# Patient Record
Sex: Female | Born: 1965 | Race: Black or African American | Hispanic: No | Marital: Single | Smoking: Never smoker
Health system: Southern US, Community
[De-identification: ages and names within clinical notes are randomized; demographics above are authoritative.]

## PROBLEM LIST (undated history)

## (undated) DIAGNOSIS — Z923 Personal history of irradiation: Secondary | ICD-10-CM

## (undated) DIAGNOSIS — Z9221 Personal history of antineoplastic chemotherapy: Secondary | ICD-10-CM

## (undated) DIAGNOSIS — C7951 Secondary malignant neoplasm of bone: Secondary | ICD-10-CM

## (undated) DIAGNOSIS — F32A Depression, unspecified: Secondary | ICD-10-CM

## (undated) DIAGNOSIS — F329 Major depressive disorder, single episode, unspecified: Secondary | ICD-10-CM

## (undated) DIAGNOSIS — C801 Malignant (primary) neoplasm, unspecified: Secondary | ICD-10-CM

## (undated) DIAGNOSIS — K219 Gastro-esophageal reflux disease without esophagitis: Secondary | ICD-10-CM

## (undated) DIAGNOSIS — F419 Anxiety disorder, unspecified: Secondary | ICD-10-CM

## (undated) DIAGNOSIS — D649 Anemia, unspecified: Secondary | ICD-10-CM

## (undated) DIAGNOSIS — R51 Headache: Secondary | ICD-10-CM

## (undated) DIAGNOSIS — R519 Headache, unspecified: Secondary | ICD-10-CM

## (undated) DIAGNOSIS — E119 Type 2 diabetes mellitus without complications: Secondary | ICD-10-CM

## (undated) HISTORY — PX: TUBAL LIGATION: SHX77

## (undated) HISTORY — PX: CHOLECYSTECTOMY: SHX55

## (undated) HISTORY — PX: CERVICAL CONE BIOPSY: SUR198

---

## 1998-08-15 ENCOUNTER — Inpatient Hospital Stay (HOSPITAL_COMMUNITY): Admission: AD | Admit: 1998-08-15 | Discharge: 1998-08-15 | Payer: Self-pay | Admitting: *Deleted

## 1998-11-14 ENCOUNTER — Emergency Department (HOSPITAL_COMMUNITY): Admission: EM | Admit: 1998-11-14 | Discharge: 1998-11-14 | Payer: Self-pay | Admitting: *Deleted

## 1999-01-15 ENCOUNTER — Ambulatory Visit (HOSPITAL_COMMUNITY): Admission: RE | Admit: 1999-01-15 | Discharge: 1999-01-15 | Payer: Self-pay | Admitting: Family Medicine

## 1999-01-15 ENCOUNTER — Encounter: Payer: Self-pay | Admitting: Family Medicine

## 1999-02-06 ENCOUNTER — Encounter: Payer: Self-pay | Admitting: Orthopedic Surgery

## 1999-02-06 ENCOUNTER — Encounter: Admission: RE | Admit: 1999-02-06 | Discharge: 1999-02-06 | Payer: Self-pay | Admitting: Physical Therapy

## 1999-06-06 ENCOUNTER — Emergency Department (HOSPITAL_COMMUNITY): Admission: EM | Admit: 1999-06-06 | Discharge: 1999-06-06 | Payer: Self-pay | Admitting: Emergency Medicine

## 1999-08-09 ENCOUNTER — Other Ambulatory Visit: Admission: RE | Admit: 1999-08-09 | Discharge: 1999-08-09 | Payer: Self-pay | Admitting: Family Medicine

## 2000-11-06 ENCOUNTER — Inpatient Hospital Stay (HOSPITAL_COMMUNITY): Admission: AD | Admit: 2000-11-06 | Discharge: 2000-11-06 | Payer: Self-pay | Admitting: Obstetrics & Gynecology

## 2002-05-09 ENCOUNTER — Emergency Department (HOSPITAL_COMMUNITY): Admission: EM | Admit: 2002-05-09 | Discharge: 2002-05-09 | Payer: Self-pay | Admitting: *Deleted

## 2002-05-09 ENCOUNTER — Encounter: Payer: Self-pay | Admitting: Emergency Medicine

## 2003-11-11 ENCOUNTER — Inpatient Hospital Stay (HOSPITAL_COMMUNITY): Admission: AD | Admit: 2003-11-11 | Discharge: 2003-11-11 | Payer: Self-pay | Admitting: Obstetrics

## 2005-06-11 ENCOUNTER — Emergency Department (HOSPITAL_COMMUNITY): Admission: EM | Admit: 2005-06-11 | Discharge: 2005-06-11 | Payer: Self-pay | Admitting: Family Medicine

## 2005-07-20 ENCOUNTER — Emergency Department (HOSPITAL_COMMUNITY): Admission: EM | Admit: 2005-07-20 | Discharge: 2005-07-20 | Payer: Self-pay | Admitting: Family Medicine

## 2006-04-26 ENCOUNTER — Inpatient Hospital Stay (HOSPITAL_COMMUNITY): Admission: AD | Admit: 2006-04-26 | Discharge: 2006-04-26 | Payer: Self-pay | Admitting: Gynecology

## 2006-06-05 ENCOUNTER — Inpatient Hospital Stay (HOSPITAL_COMMUNITY): Admission: AD | Admit: 2006-06-05 | Discharge: 2006-06-05 | Payer: Self-pay | Admitting: Obstetrics & Gynecology

## 2006-08-05 ENCOUNTER — Other Ambulatory Visit: Admission: RE | Admit: 2006-08-05 | Discharge: 2006-08-05 | Payer: Self-pay | Admitting: Family Medicine

## 2006-09-24 ENCOUNTER — Emergency Department (HOSPITAL_COMMUNITY): Admission: EM | Admit: 2006-09-24 | Discharge: 2006-09-24 | Payer: Self-pay | Admitting: Emergency Medicine

## 2006-10-08 ENCOUNTER — Ambulatory Visit (HOSPITAL_COMMUNITY): Admission: RE | Admit: 2006-10-08 | Discharge: 2006-10-08 | Payer: Self-pay | Admitting: Obstetrics

## 2006-10-08 ENCOUNTER — Encounter (INDEPENDENT_AMBULATORY_CARE_PROVIDER_SITE_OTHER): Payer: Self-pay | Admitting: Obstetrics

## 2007-01-29 ENCOUNTER — Ambulatory Visit (HOSPITAL_COMMUNITY): Admission: RE | Admit: 2007-01-29 | Discharge: 2007-01-29 | Payer: Self-pay | Admitting: Obstetrics

## 2007-12-10 ENCOUNTER — Emergency Department (HOSPITAL_COMMUNITY): Admission: EM | Admit: 2007-12-10 | Discharge: 2007-12-10 | Payer: Self-pay | Admitting: Family Medicine

## 2008-07-01 ENCOUNTER — Encounter: Admission: RE | Admit: 2008-07-01 | Discharge: 2008-07-01 | Payer: Self-pay | Admitting: Family Medicine

## 2008-07-10 ENCOUNTER — Emergency Department (HOSPITAL_COMMUNITY): Admission: EM | Admit: 2008-07-10 | Discharge: 2008-07-10 | Payer: Self-pay | Admitting: Emergency Medicine

## 2010-02-11 ENCOUNTER — Inpatient Hospital Stay (HOSPITAL_COMMUNITY): Admission: AD | Admit: 2010-02-11 | Discharge: 2010-02-11 | Payer: Self-pay | Admitting: Obstetrics

## 2010-02-11 ENCOUNTER — Ambulatory Visit: Payer: Self-pay | Admitting: Obstetrics and Gynecology

## 2010-05-05 ENCOUNTER — Encounter: Payer: Self-pay | Admitting: Obstetrics

## 2010-05-06 ENCOUNTER — Encounter: Payer: Self-pay | Admitting: Obstetrics

## 2010-05-06 ENCOUNTER — Encounter: Payer: Self-pay | Admitting: Family Medicine

## 2010-06-27 LAB — WET PREP, GENITAL: Yeast Wet Prep HPF POC: NONE SEEN

## 2010-06-27 LAB — GC/CHLAMYDIA PROBE AMP, GENITAL
Chlamydia, DNA Probe: NEGATIVE
GC Probe Amp, Genital: NEGATIVE

## 2010-08-28 NOTE — Op Note (Signed)
Caitlin Cohen, Caitlin Cohen               ACCOUNT NO.:  [id number]   MEDICAL RECORD NO.:  [id number]          PATIENT TYPE:  AMB   LOCATION:  SDC                           FACILITY:  [facility]   PHYSICIAN:  Kathreen Cosier, M.D.DATE OF BIRTH:  04-Sep-1965   DATE OF PROCEDURE:  10/08/2006  DATE OF DISCHARGE:                               OPERATIVE REPORT   PREOPERATIVE DIAGNOSIS:  Severe dysphasia of the cervix.   PROCEDURE:  Cold-knife conization.   Under general anesthesia, the patient in the lithotomy position,  perineum and vagina prepped and draped, bladder emptied with a straight  catheter, weighted speculum placed in the vagina. Hemostatic sutures of  #1 chromic placed high up on the cervix at 3 o'clock and 9 o'clock, and  then a cold-knife cone done in the usual manner. U-shaped sutures of #1  were placed around the cervix for hemostasis. It was noted after that  was complete that she still had some oozing, so a #5 M[place]il[place] band was  placed around the cervix and tied at 6 o'clock. Hemostasis was  satisfactory. Blood loss was about 200 mL. The patient taken to the  recovery room in good condition.           ______________________________  Kathreen Cosier, M.D.     BAM/MEDQ  D:  10/08/2006  T:  10/08/2006  Job:  [id number]

## 2010-09-27 ENCOUNTER — Emergency Department (HOSPITAL_COMMUNITY)
Admission: EM | Admit: 2010-09-27 | Discharge: 2010-09-27 | Disposition: A | Discharge status: Home / Self Care | Payer: Self-pay | Attending: Emergency Medicine | Admitting: Emergency Medicine

## 2010-09-27 DIAGNOSIS — R599 Enlarged lymph nodes, unspecified: Secondary | ICD-10-CM | POA: Insufficient documentation

## 2010-09-27 DIAGNOSIS — R059 Cough, unspecified: Secondary | ICD-10-CM | POA: Insufficient documentation

## 2010-09-27 DIAGNOSIS — J069 Acute upper respiratory infection, unspecified: Secondary | ICD-10-CM | POA: Insufficient documentation

## 2010-09-27 DIAGNOSIS — H9209 Otalgia, unspecified ear: Secondary | ICD-10-CM | POA: Insufficient documentation

## 2010-09-27 DIAGNOSIS — J029 Acute pharyngitis, unspecified: Secondary | ICD-10-CM | POA: Insufficient documentation

## 2010-09-27 DIAGNOSIS — R05 Cough: Secondary | ICD-10-CM | POA: Insufficient documentation

## 2011-01-30 LAB — CBC
MCHC: 32.9
MCV: 81.4
RBC: 4.45
RDW: 15.1 — ABNORMAL HIGH

## 2012-03-07 ENCOUNTER — Emergency Department (HOSPITAL_COMMUNITY)
Admission: EM | Admit: 2012-03-07 | Discharge: 2012-03-07 | Disposition: A | Discharge status: Home / Self Care | Payer: Medicaid Other | Attending: Emergency Medicine | Admitting: Emergency Medicine

## 2012-03-07 ENCOUNTER — Emergency Department (HOSPITAL_COMMUNITY): Payer: Medicaid Other

## 2012-03-07 ENCOUNTER — Encounter (HOSPITAL_COMMUNITY): Payer: Self-pay | Admitting: *Deleted

## 2012-03-07 DIAGNOSIS — Y929 Unspecified place or not applicable: Secondary | ICD-10-CM | POA: Insufficient documentation

## 2012-03-07 DIAGNOSIS — W010XXA Fall on same level from slipping, tripping and stumbling without subsequent striking against object, initial encounter: Secondary | ICD-10-CM | POA: Insufficient documentation

## 2012-03-07 DIAGNOSIS — S52109A Unspecified fracture of upper end of unspecified radius, initial encounter for closed fracture: Secondary | ICD-10-CM | POA: Insufficient documentation

## 2012-03-07 DIAGNOSIS — Y9301 Activity, walking, marching and hiking: Secondary | ICD-10-CM | POA: Insufficient documentation

## 2012-03-07 DIAGNOSIS — S52123A Displaced fracture of head of unspecified radius, initial encounter for closed fracture: Secondary | ICD-10-CM

## 2012-03-07 DIAGNOSIS — S52209A Unspecified fracture of shaft of unspecified ulna, initial encounter for closed fracture: Secondary | ICD-10-CM | POA: Insufficient documentation

## 2012-03-07 MED ORDER — OXYCODONE-ACETAMINOPHEN 5-325 MG PO TABS
1.0000 | ORAL_TABLET | Freq: Once | ORAL | Status: AC
  Administered 2012-03-07: 1 via ORAL
  Filled 2012-03-07: qty 1

## 2012-03-07 MED ORDER — IBUPROFEN 400 MG PO TABS
400.0000 mg | ORAL_TABLET | Freq: Once | ORAL | Status: AC
  Administered 2012-03-07: 400 mg via ORAL
  Filled 2012-03-07: qty 1

## 2012-03-07 MED ORDER — OXYCODONE-ACETAMINOPHEN 5-325 MG PO TABS: 1.0000 | ORAL_TABLET | ORAL | Status: DC | PRN

## 2012-03-07 NOTE — ED Notes (Signed)
Patient transported to X-ray 

## 2012-03-07 NOTE — ED Notes (Signed)
Pt reports playing with grandson early this am, tripped over a toy, and landed on right forearm. Denies hitting head or LOC. Rates pain 10/10. Moves extremity with some difficulty due to pain. Good radial pulse and capillary refill on right upper extremity

## 2012-03-07 NOTE — ED Provider Notes (Signed)
Medical screening examination/treatment/procedure(s) were performed by non-physician practitioner and as supervising physician I was immediately available for consultation/collaboration.   Shekira Drummer, MD 03/07/12 0755 

## 2012-03-07 NOTE — ED Notes (Addendum)
Fell indoors on carpeted surface, braced fall by putting R hand out, c/o R wrist pain, no obvious deformity, swelling and tenderness present, no bruising, skin intact, CMS intact, ROM limited.

## 2012-03-07 NOTE — ED Provider Notes (Signed)
History     CSN: [id number]  Arrival date & time 03/07/12  1308   First MD Initiated Contact with Patient 03/07/12 0242      Chief Complaint  Patient presents with  . Arm Pain   HPI  History provided by the patient. Patient is a 46 year old female with no significant PMH who presents with fall and right wrist pain and injury. Patient states she was walking to the house and tripped over her son's toy truck causing her to fall over her outstretched right hand. She has had pain and swelling to the wrist area worse with any movements. She has not used any treatment for symptoms and came straight to the emergency room. She denies any other injury from the fall. Denies any head injury or LOC. Denies any neck or back pains. Patient has no other complaints.    History reviewed. No pertinent past medical history.  Past Surgical History  Procedure Date  . Cholecystectomy   . Tubal ligation     History reviewed. No pertinent family history.  History  Substance Use Topics  . Smoking status: Never Smoker   . Smokeless tobacco: Not on file  . Alcohol Use: No    OB History    Grav Para Term Preterm Abortions TAB SAB Ect Mult Living                  Review of Systems  Constitutional: Negative for chills.  HENT: Negative for neck pain.   Gastrointestinal: Negative for nausea and vomiting.  Musculoskeletal: Negative for back pain.  Neurological: Negative for weakness and numbness.  All other systems reviewed and are negative.    Allergies  Review of patient's allergies indicates no known allergies.  Home Medications  No current outpatient prescriptions on file.  BP 175/100  Temp 99.2 F (37.3 C) (Oral)  Resp 18  SpO2 98%  LMP 02/08/2012  Physical Exam  Nursing note and vitals reviewed. Constitutional: She is oriented to person, place, and time. She appears well-developed and well-nourished. No distress.  HENT:  Head: Normocephalic.  Cardiovascular: Normal rate and  regular rhythm.   No murmur heard. Pulmonary/Chest: Effort normal and breath sounds normal.  Musculoskeletal:       Diffuse swelling with tenderness around the right wrist without gross deformity. Normal radial pulses. Patient able to fully extend and close fingers and to this. She reports slight change in sensation over the third through fifth fingers but is able to discriminate light and sharp touch. She has normal cap refill throughout.  Neurological: She is alert and oriented to person, place, and time.  Skin: Skin is warm and dry. No rash noted. No erythema.  Psychiatric: She has a normal mood and affect. Her behavior is normal.    ED Course  Procedures    Dg Wrist Complete Right  03/07/2012  *RADIOLOGY REPORT*  Clinical Data: Right wrist pain after fall.  RIGHT WRIST - COMPLETE 3+ VIEW  Comparison: None.  Findings: Comminuted fractures of the distal right radial metaphysis with fracture lines extending to the radial carpal and radial ulnar joints.  There is impaction and dorsal angulation of the distal fracture fragments.  There is associated fracture of the base of the ulnar styloid process with what is probably an additional old ununited ulnar styloid ossicle.  Degenerative changes in the carpus.  Soft tissue swelling.  IMPRESSION: Fractures of the distal right radius and ulna as described.   Original Report Authenticated By: Burman Nieves, M.D.  1. Radial head fracture   2. Ulnar fracture       MDM  Patient seen and evaluated. Patient did receive Percocet still and mild pain. Will repeat Percocet.  X-ray them and straight fractures of the distal right radius and on. Patient was discussed with attending physician. At this time will place in sugar tong is pleasant and have patient followup with orthopedic hand specialist. Patient given diagnosis and instructions and agrees with plan. She will call Dr. Cheree Ditto in on Monday. Patient given prescriptions of Percocet for pain. She  was instructed on rice tx.        Angus Seller, [place] 03/07/12 [phone]

## 2012-03-16 ENCOUNTER — Other Ambulatory Visit: Payer: Self-pay | Admitting: Orthopedic Surgery

## 2012-03-16 ENCOUNTER — Encounter (HOSPITAL_COMMUNITY): Payer: Self-pay | Admitting: *Deleted

## 2012-03-16 NOTE — Progress Notes (Signed)
I called Dr Carlos Levering office and spoke with Olene Craven, I asked that orders be entered.

## 2012-03-17 ENCOUNTER — Ambulatory Visit (HOSPITAL_COMMUNITY)
Admission: RE | Admit: 2012-03-17 | Discharge: 2012-03-19 | Disposition: A | Discharge status: Home / Self Care | Payer: Medicaid Other | Source: Ambulatory Visit | Attending: Orthopedic Surgery | Admitting: Orthopedic Surgery

## 2012-03-17 ENCOUNTER — Ambulatory Visit (HOSPITAL_COMMUNITY): Payer: Medicaid Other | Admitting: Anesthesiology

## 2012-03-17 ENCOUNTER — Encounter (HOSPITAL_COMMUNITY): Payer: Self-pay | Admitting: *Deleted

## 2012-03-17 ENCOUNTER — Encounter (HOSPITAL_COMMUNITY): Payer: Self-pay | Admitting: Anesthesiology

## 2012-03-17 ENCOUNTER — Encounter (HOSPITAL_COMMUNITY)
Admission: RE | Disposition: A | Discharge status: Home / Self Care | Payer: Self-pay | Source: Ambulatory Visit | Attending: Orthopedic Surgery

## 2012-03-17 ENCOUNTER — Encounter (HOSPITAL_COMMUNITY): Payer: Self-pay | Admitting: Pharmacy Technician

## 2012-03-17 DIAGNOSIS — S52509A Unspecified fracture of the lower end of unspecified radius, initial encounter for closed fracture: Secondary | ICD-10-CM | POA: Insufficient documentation

## 2012-03-17 DIAGNOSIS — S52609A Unspecified fracture of lower end of unspecified ulna, initial encounter for closed fracture: Secondary | ICD-10-CM | POA: Insufficient documentation

## 2012-03-17 DIAGNOSIS — S52209A Unspecified fracture of shaft of unspecified ulna, initial encounter for closed fracture: Secondary | ICD-10-CM

## 2012-03-17 DIAGNOSIS — X58XXXA Exposure to other specified factors, initial encounter: Secondary | ICD-10-CM | POA: Insufficient documentation

## 2012-03-17 HISTORY — DX: Anxiety disorder, unspecified: F41.9

## 2012-03-17 HISTORY — DX: Depression, unspecified: F32.A

## 2012-03-17 HISTORY — PX: OPEN REDUCTION INTERNAL FIXATION (ORIF) DISTAL RADIAL FRACTURE: SHX5989

## 2012-03-17 HISTORY — DX: Major depressive disorder, single episode, unspecified: F32.9

## 2012-03-17 LAB — HCG, SERUM, QUALITATIVE: Preg, Serum: NEGATIVE

## 2012-03-17 LAB — CBC
MCH: 26 pg (ref 26.0–34.0)
MCHC: 32.7 g/dL (ref 30.0–36.0)
Platelets: 349 10*3/uL (ref 150–400)

## 2012-03-17 LAB — SURGICAL PCR SCREEN: MRSA, PCR: NEGATIVE

## 2012-03-17 SURGERY — OPEN REDUCTION INTERNAL FIXATION (ORIF) DISTAL RADIUS FRACTURE
Anesthesia: Regional | Site: Arm Lower | Laterality: Right | Wound class: Clean

## 2012-03-17 MED ORDER — OXYCODONE HCL 5 MG PO TABS
5.0000 mg | ORAL_TABLET | ORAL | Status: DC | PRN
  Administered 2012-03-17 – 2012-03-18 (×2): 5 mg via ORAL
  Filled 2012-03-17 (×3): qty 1

## 2012-03-17 MED ORDER — LACTATED RINGERS IV SOLN
INTRAVENOUS | Status: DC | PRN
  Administered 2012-03-17: 17:00:00 via INTRAVENOUS

## 2012-03-17 MED ORDER — METHOCARBAMOL 100 MG/ML IJ SOLN
500.0000 mg | Freq: Four times a day (QID) | INTRAVENOUS | Status: DC | PRN
  Administered 2012-03-17: 500 mg via INTRAVENOUS
  Filled 2012-03-17: qty 5

## 2012-03-17 MED ORDER — HYDROMORPHONE HCL PF 1 MG/ML IJ SOLN
0.5000 mg | INTRAMUSCULAR | Status: DC | PRN
  Administered 2012-03-17 – 2012-03-18 (×4): 1 mg via INTRAVENOUS
  Filled 2012-03-17 (×4): qty 1

## 2012-03-17 MED ORDER — FENTANYL CITRATE 0.05 MG/ML IJ SOLN
INTRAMUSCULAR | Status: AC
  Administered 2012-03-17: 50 ug via INTRAVENOUS
  Filled 2012-03-17: qty 2

## 2012-03-17 MED ORDER — CEFAZOLIN SODIUM-DEXTROSE 2-3 GM-% IV SOLR: 2.0000 g | INTRAVENOUS | Status: DC

## 2012-03-17 MED ORDER — FENTANYL CITRATE 0.05 MG/ML IJ SOLN
25.0000 ug | INTRAMUSCULAR | Status: DC | PRN
  Administered 2012-03-17 (×3): 50 ug via INTRAVENOUS

## 2012-03-17 MED ORDER — SODIUM CHLORIDE 0.45 % IV SOLN
INTRAVENOUS | Status: DC
  Administered 2012-03-17: 21:00:00 via INTRAVENOUS

## 2012-03-17 MED ORDER — HYDROMORPHONE HCL PF 1 MG/ML IJ SOLN
INTRAMUSCULAR | Status: AC
  Filled 2012-03-17: qty 1

## 2012-03-17 MED ORDER — CEFAZOLIN SODIUM-DEXTROSE 2-3 GM-% IV SOLR
INTRAVENOUS | Status: AC
  Filled 2012-03-17: qty 50

## 2012-03-17 MED ORDER — MUPIROCIN 2 % EX OINT
TOPICAL_OINTMENT | Freq: Two times a day (BID) | CUTANEOUS | Status: DC
  Administered 2012-03-17 – 2012-03-19 (×4): via NASAL
  Filled 2012-03-17: qty 22

## 2012-03-17 MED ORDER — TEMAZEPAM 15 MG PO CAPS
15.0000 mg | ORAL_CAPSULE | Freq: Every evening | ORAL | Status: DC | PRN
  Administered 2012-03-17: 15 mg via ORAL
  Filled 2012-03-17: qty 1

## 2012-03-17 MED ORDER — HYDROMORPHONE HCL PF 1 MG/ML IJ SOLN
0.2500 mg | INTRAMUSCULAR | Status: DC | PRN
  Administered 2012-03-17 (×4): 0.5 mg via INTRAVENOUS

## 2012-03-17 MED ORDER — MIDAZOLAM HCL 5 MG/5ML IJ SOLN
INTRAMUSCULAR | Status: DC | PRN
  Administered 2012-03-17: 2 mg via INTRAVENOUS

## 2012-03-17 MED ORDER — METHOCARBAMOL 500 MG PO TABS
500.0000 mg | ORAL_TABLET | Freq: Four times a day (QID) | ORAL | Status: DC
  Administered 2012-03-17 – 2012-03-18 (×3): 500 mg via ORAL
  Filled 2012-03-17 (×11): qty 1

## 2012-03-17 MED ORDER — MEPERIDINE HCL 25 MG/ML IJ SOLN: 6.2500 mg | INTRAMUSCULAR | Status: DC | PRN

## 2012-03-17 MED ORDER — ALPRAZOLAM 0.5 MG PO TABS
0.5000 mg | ORAL_TABLET | Freq: Four times a day (QID) | ORAL | Status: DC | PRN
  Administered 2012-03-17 – 2012-03-18 (×2): 0.5 mg via ORAL
  Filled 2012-03-17 (×2): qty 1

## 2012-03-17 MED ORDER — 0.9 % SODIUM CHLORIDE (POUR BTL) OPTIME
TOPICAL | Status: DC | PRN
  Administered 2012-03-17: 1000 mL

## 2012-03-17 MED ORDER — MUPIROCIN 2 % EX OINT
TOPICAL_OINTMENT | CUTANEOUS | Status: AC
  Filled 2012-03-17: qty 22

## 2012-03-17 MED ORDER — DIPHENHYDRAMINE HCL 25 MG PO CAPS: 25.0000 mg | ORAL_CAPSULE | Freq: Four times a day (QID) | ORAL | Status: DC | PRN

## 2012-03-17 MED ORDER — ROPIVACAINE HCL 5 MG/ML IJ SOLN
INTRAMUSCULAR | Status: DC | PRN
  Administered 2012-03-17: 30 mL via EPIDURAL

## 2012-03-17 MED ORDER — KETOROLAC TROMETHAMINE 30 MG/ML IJ SOLN: 15.0000 mg | Freq: Once | INTRAMUSCULAR | Status: DC | PRN

## 2012-03-17 MED ORDER — DEXTROSE 5 % IV SOLN
3.0000 g | INTRAVENOUS | Status: AC
  Administered 2012-03-17: 3 g via INTRAVENOUS
  Filled 2012-03-17: qty 3000

## 2012-03-17 MED ORDER — SENNA 8.6 MG PO TABS
1.0000 | ORAL_TABLET | Freq: Two times a day (BID) | ORAL | Status: DC
  Administered 2012-03-17 – 2012-03-19 (×3): 8.6 mg via ORAL
  Filled 2012-03-17 (×5): qty 1

## 2012-03-17 MED ORDER — ONDANSETRON HCL 4 MG/2ML IJ SOLN
INTRAMUSCULAR | Status: DC | PRN
  Administered 2012-03-17: 4 mg via INTRAVENOUS

## 2012-03-17 MED ORDER — ONDANSETRON HCL 4 MG/2ML IJ SOLN: 4.0000 mg | Freq: Four times a day (QID) | INTRAMUSCULAR | Status: DC | PRN

## 2012-03-17 MED ORDER — CHLORHEXIDINE GLUCONATE 4 % EX LIQD: 60.0000 mL | Freq: Once | CUTANEOUS | Status: DC

## 2012-03-17 MED ORDER — ONDANSETRON HCL 4 MG PO TABS: 4.0000 mg | ORAL_TABLET | Freq: Four times a day (QID) | ORAL | Status: DC | PRN

## 2012-03-17 MED ORDER — PROPOFOL 10 MG/ML IV BOLUS
INTRAVENOUS | Status: DC | PRN
  Administered 2012-03-17: 150 mg via INTRAVENOUS

## 2012-03-17 MED ORDER — METHOCARBAMOL 500 MG PO TABS
500.0000 mg | ORAL_TABLET | Freq: Four times a day (QID) | ORAL | Status: DC | PRN
  Administered 2012-03-18 (×2): 500 mg via ORAL
  Filled 2012-03-17 (×2): qty 1

## 2012-03-17 MED ORDER — SODIUM CHLORIDE 0.45 % IV SOLN: INTRAVENOUS | Status: DC

## 2012-03-17 MED ORDER — CEFAZOLIN SODIUM 1-5 GM-% IV SOLN
INTRAVENOUS | Status: AC
  Filled 2012-03-17: qty 50

## 2012-03-17 MED ORDER — FENTANYL CITRATE 0.05 MG/ML IJ SOLN
INTRAMUSCULAR | Status: AC
  Filled 2012-03-17: qty 2

## 2012-03-17 MED ORDER — FAMOTIDINE 20 MG PO TABS
20.0000 mg | ORAL_TABLET | Freq: Two times a day (BID) | ORAL | Status: DC | PRN
  Filled 2012-03-17: qty 1

## 2012-03-17 MED ORDER — MIDAZOLAM HCL 2 MG/2ML IJ SOLN: 0.5000 mg | Freq: Once | INTRAMUSCULAR | Status: DC | PRN

## 2012-03-17 MED ORDER — VITAMIN C 500 MG PO TABS
1000.0000 mg | ORAL_TABLET | Freq: Every day | ORAL | Status: DC
  Administered 2012-03-17 – 2012-03-19 (×3): 1000 mg via ORAL
  Filled 2012-03-17 (×3): qty 2

## 2012-03-17 MED ORDER — LIDOCAINE HCL (CARDIAC) 20 MG/ML IV SOLN
INTRAVENOUS | Status: DC | PRN
  Administered 2012-03-17: 50 mg via INTRAVENOUS

## 2012-03-17 MED ORDER — PROMETHAZINE HCL 25 MG/ML IJ SOLN: 6.2500 mg | INTRAMUSCULAR | Status: DC | PRN

## 2012-03-17 MED ORDER — SUFENTANIL CITRATE 50 MCG/ML IV SOLN
INTRAVENOUS | Status: DC | PRN
  Administered 2012-03-17: 10 ug via INTRAVENOUS
  Administered 2012-03-17: 5 ug via INTRAVENOUS
  Administered 2012-03-17 (×3): 10 ug via INTRAVENOUS

## 2012-03-17 SURGICAL SUPPLY — 59 items
BANDAGE ELASTIC 3 VELCRO ST LF (GAUZE/BANDAGES/DRESSINGS) ×2 IMPLANT
BANDAGE ELASTIC 4 VELCRO ST LF (GAUZE/BANDAGES/DRESSINGS) ×2 IMPLANT
BANDAGE GAUZE ELAST BULKY 4 IN (GAUZE/BANDAGES/DRESSINGS) ×2 IMPLANT
BIT DRILL 2 FAST STEP (BIT) ×1 IMPLANT
BIT DRILL 2.5X4 QC (BIT) ×1 IMPLANT
BLADE SURG ROTATE 9660 (MISCELLANEOUS) IMPLANT
BNDG CMPR 9X4 STRL LF SNTH (GAUZE/BANDAGES/DRESSINGS) ×1
BNDG ESMARK 4X9 LF (GAUZE/BANDAGES/DRESSINGS) ×2 IMPLANT
CLOTH BEACON ORANGE TIMEOUT ST (SAFETY) ×2 IMPLANT
CORDS BIPOLAR (ELECTRODE) ×2 IMPLANT
COVER SURGICAL LIGHT HANDLE (MISCELLANEOUS) ×2 IMPLANT
CUFF TOURNIQUET SINGLE 18IN (TOURNIQUET CUFF) ×2 IMPLANT
CUFF TOURNIQUET SINGLE 24IN (TOURNIQUET CUFF) ×1 IMPLANT
DECANTER SPIKE VIAL GLASS SM (MISCELLANEOUS) IMPLANT
DRAIN TLS ROUND 10FR (DRAIN) IMPLANT
DRAPE OEC MINIVIEW 54X84 (DRAPES) ×1 IMPLANT
DRAPE U-SHAPE 47X51 STRL (DRAPES) ×2 IMPLANT
DRSG EMULSION OIL 3X3 NADH (GAUZE/BANDAGES/DRESSINGS) ×1 IMPLANT
GAUZE XEROFORM 1X8 LF (GAUZE/BANDAGES/DRESSINGS) ×2 IMPLANT
GLOVE ORTHO TXT STRL SZ7.5 (GLOVE) ×2 IMPLANT
GLOVE SS BIOGEL STRL SZ 8 (GLOVE) ×1 IMPLANT
GLOVE SUPERSENSE BIOGEL SZ 8 (GLOVE) ×1
GOWN PREVENTION PLUS XLARGE (GOWN DISPOSABLE) ×2 IMPLANT
GOWN STRL NON-REIN LRG LVL3 (GOWN DISPOSABLE) ×3 IMPLANT
GOWN STRL REIN XL XLG (GOWN DISPOSABLE) ×4 IMPLANT
KIT BASIN OR (CUSTOM PROCEDURE TRAY) ×2 IMPLANT
KIT ROOM TURNOVER OR (KITS) ×2 IMPLANT
LOOP VESSEL MAXI BLUE (MISCELLANEOUS) IMPLANT
MANIFOLD NEPTUNE II (INSTRUMENTS) ×1 IMPLANT
NEEDLE 22X1 1/2 (OR ONLY) (NEEDLE) ×1 IMPLANT
NS IRRIG 1000ML POUR BTL (IV SOLUTION) ×2 IMPLANT
PACK ORTHO EXTREMITY (CUSTOM PROCEDURE TRAY) ×2 IMPLANT
PAD ARMBOARD 7.5X6 YLW CONV (MISCELLANEOUS) ×4 IMPLANT
PAD CAST 4YDX4 CTTN HI CHSV (CAST SUPPLIES) ×1 IMPLANT
PADDING CAST COTTON 4X4 STRL (CAST SUPPLIES) ×2
PEG SUBCHONDRAL SMOOTH 2.0X18 (Peg) ×1 IMPLANT
PEG SUBCHONDRAL SMOOTH 2.0X20 (Peg) ×1 IMPLANT
PEG SUBCHONDRAL SMOOTH 2.0X22 (Peg) ×3 IMPLANT
PEG THREADED 2.5MMX20MM LONG (Peg) ×1 IMPLANT
PLATE STAN 24.4X59.5 RT (Plate) ×1 IMPLANT
SCREW BN 12X3.5XNS CORT TI (Screw) IMPLANT
SCREW BN 13X3.5XNS CORT TI (Screw) IMPLANT
SCREW CORT 3.5X12 (Screw) ×6 IMPLANT
SCREW CORT 3.5X13 (Screw) ×2 IMPLANT
SPLINT FIBERGLASS 4X30 (CAST SUPPLIES) ×2 IMPLANT
SPONGE GAUZE 4X4 12PLY (GAUZE/BANDAGES/DRESSINGS) ×2 IMPLANT
SPONGE LAP 4X18 X RAY DECT (DISPOSABLE) ×1 IMPLANT
SUT MNCRL AB 4-0 PS2 18 (SUTURE) ×2 IMPLANT
SUT PROLENE 3 0 PS 2 (SUTURE) IMPLANT
SUT PROLENE 4 0 PS 2 18 (SUTURE) ×2 IMPLANT
SUT VIC AB 3-0 FS2 27 (SUTURE) ×2 IMPLANT
SYR CONTROL 10ML LL (SYRINGE) IMPLANT
SYSTEM CHEST DRAIN TLS 7FR (DRAIN) ×1 IMPLANT
TOWEL OR 17X24 6PK STRL BLUE (TOWEL DISPOSABLE) ×2 IMPLANT
TOWEL OR 17X26 10 PK STRL BLUE (TOWEL DISPOSABLE) ×2 IMPLANT
TUBE CONNECTING 12X1/4 (SUCTIONS) ×2 IMPLANT
TUBE EVACUATION TLS (MISCELLANEOUS) ×2 IMPLANT
UNDERPAD 30X30 INCONTINENT (UNDERPADS AND DIAPERS) ×2 IMPLANT
WATER STERILE IRR 1000ML POUR (IV SOLUTION) ×1 IMPLANT

## 2012-03-17 NOTE — Anesthesia Preprocedure Evaluation (Addendum)
Anesthesia Evaluation  Patient identified by MRN, date of birth, ID band Patient awake    Reviewed: Allergy & Precautions, H&P , Patient's Chart, lab work & pertinent test results, reviewed documented beta blocker date and time   History of Anesthesia Complications Negative for: history of anesthetic complications  Airway Mallampati: I TM Distance: >3 FB Neck ROM: full    Dental No notable dental hx. (+) Teeth Intact   Pulmonary neg pulmonary ROS,  breath sounds clear to auscultation  Pulmonary exam normal       Cardiovascular Exercise Tolerance: Good negative cardio ROS  Rhythm:regular Rate:Normal     Neuro/Psych Anxiety Depression negative neurological ROS  negative psych ROS   GI/Hepatic negative GI ROS, Neg liver ROS,   Endo/Other  negative endocrine ROS  Renal/GU negative Renal ROS     Musculoskeletal   Abdominal   Peds  Hematology negative hematology ROS (+)   Anesthesia Other Findings   Reproductive/Obstetrics negative OB ROS                          Anesthesia Physical Anesthesia Plan  ASA: III  Anesthesia Plan: General LMA and Regional   Post-op Pain Management:    Induction:   Airway Management Planned:   Additional Equipment:   Intra-op Plan:   Post-operative Plan: Extubation in OR  Informed Consent: I have reviewed the patients History and Physical, chart, labs and discussed the procedure including the risks, benefits and alternatives for the proposed anesthesia with the patient or authorized representative who has indicated his/her understanding and acceptance.   Dental Advisory Given and Dental advisory given  Plan Discussed with: CRNA, Surgeon and Anesthesiologist  Anesthesia Plan Comments:       Anesthesia Quick Evaluation

## 2012-03-17 NOTE — Anesthesia Procedure Notes (Signed)
Anesthesia Regional Block:  Supraclavicular block  Pre-Anesthetic Checklist: ,, timeout performed, Correct Patient, Correct Site, Correct Laterality, Correct Procedure, Correct Position, site marked, Risks and benefits discussed,  Surgical consent,  Pre-op evaluation,  At surgeon's request and post-op pain management  Laterality: Right  Prep: chloraprep       Needles:  Injection technique: Single-shot  Needle Type: Echogenic Stimulator Needle     Needle Length: 5cm 5 cm Needle Gauge: 21 G    Additional Needles:  Procedures: ultrasound guided (picture in chart) and nerve stimulator Supraclavicular block  Nerve Stimulator or Paresthesia:  Response: 0.4 mA,   Additional Responses:   Narrative:  Start time: 03/17/2012 4:45 PM End time: 03/17/2012 5:00 PM Injection made incrementally with aspirations every 5 mL.  Performed by: Personally  Anesthesiologist: Arta Bruce MD  Additional Notes: Monitors applied. Patient sedated. Sterile prep and drape,hand hygiene and sterile gloves were used. Relevant anatomy identified.Needle position confirmed.Local anesthetic injected incrementally after negative aspiration. Local anesthetic spread visualized around nerve(s). Vascular puncture avoided. No complications. Image printed for medical record.The patient tolerated the procedure well.       Supraclavicular block

## 2012-03-17 NOTE — Op Note (Signed)
See dictation #[id number] Dominica Severin MD

## 2012-03-17 NOTE — Transfer of Care (Signed)
Immediate Anesthesia Transfer of Care Note  Patient: Caitlin Cohen  Procedure(s) Performed: Procedure(s) (LRB) with comments: OPEN REDUCTION INTERNAL FIXATION (ORIF) DISTAL RADIAL FRACTURE (Right) - Pre-operative a supra-clavical block right arm in addition to general anesthesia  Patient Location: PACU  Anesthesia Type:General  Level of Consciousness: awake, alert  and oriented  Airway & Oxygen Therapy: Patient Spontanous Breathing and Patient connected to nasal cannula oxygen  Post-op Assessment: Report given to PACU RN, Post -op Vital signs reviewed and stable and Patient moving all extremities  Post vital signs: Reviewed and stable  Complications: No apparent anesthesia complications

## 2012-03-17 NOTE — H&P (Signed)
Caitlin Cohen is an 46 y.o. female.   Chief Complaint: Right wrist fracture HPI: Caitlin KitchenMarland KitchenPatient presents for evaluation and treatment of the of their upper extremity predicament. The patient denies neck back chest or of abdominal pain. The patient notes that they have no lower extremity problems. The patient from primarily complains of the upper extremity pain noted. Plan ORIF right wrist fracure  Past Medical History  Diagnosis Date  . Anxiety   . Depression     Past Surgical History  Procedure Date  . Cholecystectomy   . Tubal ligation     History reviewed. No pertinent family history. Social History:  reports that she has never smoked. She does not have any smokeless tobacco history on file. She reports that she does not drink alcohol or use illicit drugs.  Allergies: No Known Allergies  Medications Prior to Admission  Medication Sig Dispense Refill  . methocarbamol (ROBAXIN) 500 MG tablet Take 500 mg by mouth 4 (four) times daily.      Caitlin Cohen oxyCODONE (OXY IR/ROXICODONE) 5 MG immediate release tablet Take 5 mg by mouth every 4 (four) hours as needed. 1 to tablets prn.      . oxyCODONE-acetaminophen (PERCOCET) 5-325 MG per tablet Take 1-2 tablets by mouth every 4 (four) hours as needed for pain.  30 tablet  0    Results for orders placed during the hospital encounter of 03/17/12 (from the past 48 hour(s))  SURGICAL PCR SCREEN     Status: Abnormal   Collection Time   03/17/12  1:37 PM      Component Value Range Comment   MRSA, PCR NEGATIVE  NEGATIVE    Staphylococcus aureus POSITIVE (*) NEGATIVE   CBC     Status: Normal   Collection Time   03/17/12  2:00 PM      Component Value Range Comment   WBC 9.0  4.0 - 10.5 K/uL    RBC 4.65  3.87 - 5.11 MIL/uL    Hemoglobin 12.1  12.0 - 15.0 g/dL    HCT 45.4  09.8 - 11.9 %    MCV 79.6  78.0 - 100.0 fL    MCH 26.0  26.0 - 34.0 pg    MCHC 32.7  30.0 - 36.0 g/dL    RDW 14.7  82.9 - 56.2 %    Platelets 349  150 - 400 K/uL   HCG, SERUM,  QUALITATIVE     Status: Normal   Collection Time   03/17/12  2:00 PM      Component Value Range Comment   Preg, Serum NEGATIVE  NEGATIVE    No results found.  Review of Systems  Constitutional: Negative.   HENT: Negative.   Eyes: Negative.   Respiratory: Negative.   Cardiovascular: Negative.   Gastrointestinal: Negative.   Genitourinary: Negative.   Skin: Negative.   Endo/Heme/Allergies: Negative.     Blood pressure 139/87, pulse 75, temperature 98.2 F (36.8 C), temperature source Oral, resp. rate 20, height 5\' 7"  (1.702 m), weight 138.9 kg (306 lb 3.5 oz), last menstrual period 03/05/2012, SpO2 98.00%. Physical Exam ..The patient is alert and oriented in no acute distress the patient complains of pain in the affected upper extremity. The patient is noted to have a normal HEENT exam. Lung fields show equal chest expansion and no shortness of breath abdomen exam is nontender without distention. Lower extremity examination does not show any fracture dislocation or blood clot symptoms. Pelvis is stable neck and back are stable and nontender  Right closed wrist fracture NVI Assessment/Plan .Caitlin KitchenWe are planning surgery for your upper extremity. The risk and benefits of surgery include risk of bleeding infection anesthesia damage to normal structures and failure of the surgery to accomplish its intended goals of relieving symptoms and restoring function with this in mind we'll going to proceed. I have specifically discussed with the patient the pre-and postoperative regime and the does and don'ts and risk and benefits in great detail. Risk and benefits of surgery also include risk of dystrophy chronic nerve pain failure of the healing process to go onto completion and other inherent risks of surgery The relavent the pathophysiology of the disease/injury process, as well as the alternatives for treatment and postoperative course of action has been discussed in great detail with the patient who  desires to proceed.  We will do everything in our power to help you (the patient) restore function to the upper extremity. Is a pleasure to see this patient today.  Karen Chafe 03/17/2012, 4:51 PM

## 2012-03-17 NOTE — Preoperative (Signed)
Beta Blockers   Reason not to administer Beta Blockers:Not Applicable 

## 2012-03-17 NOTE — Anesthesia Postprocedure Evaluation (Signed)
Anesthesia Post Note  Patient: Caitlin Cohen  Procedure(s) Performed: Procedure(s) (LRB): OPEN REDUCTION INTERNAL FIXATION (ORIF) DISTAL RADIAL FRACTURE (Right)  Anesthesia type: General  Patient location: PACU  Post pain: Pain level controlled and Adequate analgesia  Post assessment: Post-op Vital signs reviewed, Patient's Cardiovascular Status Stable, Respiratory Function Stable, Patent Airway and Pain level controlled  Last Vitals:  Filed Vitals:   03/17/12 1953  BP:   Pulse: 70  Temp:   Resp: 15    Post vital signs: Reviewed and stable  Level of consciousness: awake, alert  and oriented  Complications: No apparent anesthesia complications

## 2012-03-18 ENCOUNTER — Encounter (HOSPITAL_COMMUNITY): Payer: Self-pay | Admitting: Orthopedic Surgery

## 2012-03-18 MED ORDER — HYDROMORPHONE HCL 2 MG PO TABS
2.0000 mg | ORAL_TABLET | ORAL | Status: DC | PRN
  Administered 2012-03-18 (×3): 2 mg via ORAL
  Administered 2012-03-18 – 2012-03-19 (×3): 4 mg via ORAL
  Filled 2012-03-18: qty 2
  Filled 2012-03-18: qty 1
  Filled 2012-03-18: qty 2
  Filled 2012-03-18: qty 1
  Filled 2012-03-18: qty 2

## 2012-03-18 MED ORDER — METHOCARBAMOL 500 MG PO TABS: 500.0000 mg | ORAL_TABLET | Freq: Four times a day (QID) | ORAL | Status: DC

## 2012-03-18 MED ORDER — HYDROMORPHONE HCL 2 MG PO TABS: 2.0000 mg | ORAL_TABLET | ORAL | Status: DC | PRN

## 2012-03-18 MED ORDER — HYDROMORPHONE HCL 2 MG PO TABS
2.0000 mg | ORAL_TABLET | ORAL | Status: DC | PRN
  Administered 2012-03-18: 2 mg via ORAL
  Filled 2012-03-18 (×2): qty 1

## 2012-03-18 NOTE — Discharge Summary (Signed)
Physician Discharge Summary  Patient ID: KC SEDLAK MRN: [id number] DOB/AGE: 05-23-1965 46 y.o.  Admit date: 03/17/2012 Discharge date: 03/18/2012  Admission Diagnoses: Right distal radius and ulna fracture  Discharge Diagnoses: See above, PCR screen positive for Staphylococcus aureus, non-MRSA   Discharged Condition: Improved  Hospital Course: The patient is a very pleasant 47 year old female who unfortunately sustained a distal radius and on the fracture about the right upper extremity last weekend. The patient was subsequently seen and examined in our office setting. She was noted to have a comminuted interarticular distal radius fracture as well as a distal ulna/ulnar styloid fracture. Given the degree of comminution and high propensity for progressive collapse and angulation we discussed with the patient surgical fixation. She presented December 2 to [facility] cone for surgical intervention.  Preoperatively the patient was counseled in regard to all risk and benefits of surgical intervention. The patient underwent surgical intervention in the form of an open reduction internal fixation right distal radius fracture, closed treatment of the right distal arm fracture. She tolerated the procedure well there were no complications, please see operative report for full details. The patient was admitted to the orthopedic unit, with standard orders of IV pain medication management, antibiotics in the form of Ancef, and close observation she did very well postoperatively her pain was controlled with a combination of the hand IV pain medications. The patient continued to receive Ancef 1 g every 8 hours for antibiotic prophylaxis and given the history she was positive for coccus, MSSA. Postop day #1 the patient was sitting up in a chair she was very pleasant and in no acute distress. She still receiving a combination of IV pain medications as well as the pain medications. She was tolerating the hydromorphone  with better results compared to the oxycodone thus we discussed with her discharging her with a prescription for the hydromorphone tenably we discussed with the patient discharging her on December the 4, her pain was controlled with oral pain medications. All questions were encouraged and answered all the discharge instructions were discussed at length.  Consults: None   Treatments: Open reduction internal fixation right distal radius fracture, closed treatment right distal ulna fracture  Discharge Exam: Blood pressure 115/68, pulse 71, temperature 98.7 F (37.1 C), temperature source Oral, resp. rate 16, height 5\' 7"  (1.702 m), weight 138.9 kg (306 lb 3.5 oz), last menstrual period 03/05/2012, SpO2 99.00%. The patient is awake, alert and oriented, she is in no acute very pleasant this morning and appears comfortable. She is tolerating breakfast this morning and voiding without difficulty. Head is atraumatic normocephalic  Chest equal expansions present respirations nonlabored  Abdomen nontender  Right upper extremity revealed her splint was clean and dry digital range of motion was intact digits were well arm and had excellent refill noted. Range of motion is intact to the digits. The drain was removed without difficulties. There was no significant edema present.  Disposition: 01-Home or Self Care  Discharge Orders    Future Orders Please Complete By Expires   Diet - low sodium heart healthy      Call MD / Call 911      Comments:   If you experience chest pain or shortness of breath, CALL 911 and be transported to the hospital emergency room.  If you develope a fever above 101 F, pus (white drainage) or increased drainage or redness at the wound, or calf pain, call your surgeon's office.   Constipation Prevention  Comments:   Drink plenty of fluids.  Prune juice may be helpful.  You may use a stool softener, such as Colace (over the counter) 100 mg twice a day.  Use MiraLax (over  the counter) for constipation as needed.   Increase activity slowly as tolerated      Discharge instructions      Comments:   Marland KitchenMarland KitchenKeep bandage clean and dry.  Call for any problems.  No smoking.  Criteria for driving a car: you should be off your pain medicine for 7-8 hours, able to drive one handed(confident), thinking clearly and feeling able in your judgement to drive. Continue elevation as it will decrease swelling.  If instructed by MD move your fingers within the confines of the bandage/splint.  Use ice if instructed by your MD. Call immediately for any sudden loss of feeling in your hand/arm or change in functional abilities of the extremity. [name].We recommend that you to take vitamin C 1000 mg a day to promote healing we also recommend that if you require her pain medicine that he take a stool softener to prevent constipation as most pain medicines will have constipation side effects. We recommend either Peri-Colace or Senokot and recommend that you also consider adding MiraLAX to prevent the constipation affects from pain medicine if you are required to use them. These medicines are over the counter and maybe purchased at a local pharmacy.       Medication List     As of 03/18/2012  8:12 AM    STOP taking these medications         oxyCODONE 5 MG immediate release tablet   Commonly known as: Oxy IR/ROXICODONE      oxyCODONE-acetaminophen 5-325 MG per tablet   Commonly known as: PERCOCET/ROXICET      TAKE these medications         methocarbamol 500 MG tablet   Commonly known as: ROBAXIN   Take 1 tablet (500 mg total) by mouth 4 (four) times daily.           Follow-up Information    Follow up with Karen Chafe, MD. Schedule an appointment as soon as possible for a visit in 10 days. (calll [phone] for questions or concerns)    Contact information:   [address] [place] [address] [phone]           Signed: Sheran Lawless 03/18/2012, 8:12 AM

## 2012-03-18 NOTE — Op Note (Signed)
Caitlin Cohen, Caitlin Cohen               ACCOUNT NO.:  [id number]  MEDICAL RECORD NO.:  [id number]  LOCATION:                                 FACILITY:  PHYSICIAN:  Dionne Ano. Marvens Hollars, M.D.DATE OF BIRTH:  [birth year]  DATE OF PROCEDURE:  03/17/2012 DATE OF DISCHARGE:                              OPERATIVE REPORT   PREOPERATIVE DIAGNOSIS:  Comminuted fracture, right distal radius and ulnar.  POSTOPERATIVE DIAGNOSIS:  Comminuted fracture, right distal radius and ulnar.  PROCEDURE: 1. Open reduction and internal fixation of right distal radius     fracture, sliding brachioradialis sternotomy. 2. __________ radiography. 3. Evaluation and anesthesia distal radial ulnar joint. 4. Closed treatment ulnar styloid fracture with no instability noted.  SURGEON:  Dionne Ano. Amanda Pea, M.D.  ASSISTANT:  Karie Chimera.  COMPLICATIONS:  None.  ANESTHESIA:  General with preoperative block.  TOURNIQUET TIME:  Less than an hour.  DRAINS:  One.  INDICATIONS:  The patient is pleasant female 46 years of age with comminuted complex fracture.  This is greater than __________ intraarticular distal radius fracture.  She counseled in regard to risks and benefits.  She desires to proceed.  OPERATION IN DETAIL:  The patient was seen by myself and anesthesia, given 3 g of Ancef preoperatively.  Block was placed by Dr. __________. Following this, the patient underwent a thorough discussion of course and general anesthetic in the operative arena.  Pre and postop check list were completed.  Time-out was called.  The arm was prepped and draped in usual sterile fashion __________ 250 mmHg and a goal radial approach is made to the wrist.  If __________ incised superficially and more deeply __________ fasciotomy was accomplished.  Pronator was incised in a L-shape fashion and the pronators __________.  I accessed the fracture, performed reduction, and then placement of a DVR patent screws.  Reduction technique  was performed and sliding brachioradialis sternotomy was performed __________ which has an intraarticular component.  Patent screw was placed in a standard technique without difficulty achieving proper radial height, inclination of __________. There were no complicating features.  Following this, I irrigated copiously, checked out screw length, irrigated copiously, and closed pronator with 3-0 Vicryl.  Following this, the drain was placed in the __________ #7 drain following this and then performed closure at the subcu with Vicryl and then closure of the skin edge with Prolene.  The patient underwent evaluation of the distal radial ulnar joint and it was stable.  The patient also underwent closed treatment of the ulnar styloid fracture.  I considered open treatment be given the stability, I felt more operation was not in her best benefit.  We will watch this closely in the postop period.  The patient tolerated the procedure well. She had soft compartments __________ no complicating features with Adaptic Xeroform sterile dressing and a long arm splint with __________ slab was placed.  She was taken to the recovery room and monitored to be admitted overnight for IV antibiotics.  These notes have been discussed. All questions have been encouraged and answered.     Dionne Ano. Amanda Pea, M.D.     [facility]  D:  03/17/2012  T:  03/18/2012  Job:  [id number]

## 2012-03-18 NOTE — Progress Notes (Signed)
PT Cancellation Note  Patient Details Name: Caitlin Cohen MRN: [id number] DOB: 09-Sep-1965   Cancelled Treatment:    Per OT, pt without PT needs, PT signing off.   Rai Severns, [place] 03/18/2012, 12:37 PM

## 2012-03-18 NOTE — Progress Notes (Signed)
Occupational Therapy Evaluation Patient Details Name: Caitlin Cohen MRN: [id number] DOB: Jun 28, 1965 Today's Date: 03/18/2012 Time: 4098-1191 OT Time Calculation (min): 23 min  OT Assessment / Plan / Recommendation Clinical Impression  46 yo s/p R radius fx. ORIF. Completed all education with pt regarding ADL. ROM. precautions and edema control. will follow up with Dr. Butler Denmark    OT Assessment  Progress rehab  as ordered by MD at follow-up appointment    Follow Up Recommendations  Other (comment) (f/u with Dr. Butler Denmark)    Barriers to Discharge  none    Equipment Recommendations  None recommended by OT    Recommendations for Other Services  none  Frequency    eval only   Precautions / Restrictions Precautions Precautions: Other (comment) Required Braces or Orthoses: Other Brace/Splint (RUE splint) Restrictions Weight Bearing Restrictions: Yes RUE Weight Bearing: Non weight bearing   Pertinent Vitals/Pain 6/10. Premedicated. Cold applied    ADL  Eating/Feeding: Modified independent Where Assessed - Eating/Feeding: Chair Grooming: Set up Where Assessed - Grooming: Unsupported standing Upper Body Bathing: Minimal assistance Where Assessed - Upper Body Bathing: Unsupported sitting Lower Body Bathing: Set up Where Assessed - Lower Body Bathing: Unsupported standing Upper Body Dressing: Modified independent Where Assessed - Upper Body Dressing: Unsupported sitting Lower Body Dressing: Set up Where Assessed - Lower Body Dressing: Unsupported sit to stand Toilet Transfer: Modified independent Toilet Transfer Method: Sit to Barista: Comfort height toilet Toileting - Clothing Manipulation and Hygiene: Modified independent Where Assessed - Engineer, mining and Hygiene: Sit to stand from 3-in-1 or toilet Tub/Shower Transfer: Modified independent Transfers/Ambulation Related to ADLs: mod I ADL Comments: Educated pt on 1 handed compensatory  techniques, ROM digits and R shoulder and edema control    OT Diagnosis:    OT Problem List:   OT Treatment Interventions:     OT Goals Acute Rehab OT Goals OT Goal Formulation:  (eval only)  Visit Information  Last OT Received On: 03/18/12 Assistance Needed: +1    Subjective Data      Prior Functioning     Home Living Lives With: Family Available Help at Discharge: Family Type of Home: House Home Layout: One level Bathroom Shower/Tub: Engineer, manufacturing systems: Standard Prior Function Level of Independence: Independent Able to Take Stairs?: Yes Driving: Yes Vocation: Full time employment Communication Communication: No difficulties Dominant Hand: Right         Vision/Perception     Cognition  Overall Cognitive Status: Appears within functional limits for tasks assessed/performed Arousal/Alertness: Awake/alert Orientation Level: Appears intact for tasks assessed Behavior During Session: [facility] for tasks performed    Extremity/Trunk Assessment Right Upper Extremity Assessment RUE ROM/Strength/Tone: Deficits;Unable to fully assess;Due to pain;Due to precautions RUE ROM/Strength/Tone Deficits: splinted at 80 elbow flexion RUE Sensation: WFL - Light Touch;WFL - Proprioception RUE Coordination: Deficits Left Upper Extremity Assessment LUE ROM/Strength/Tone: WFL for tasks assessed LUE Sensation: WFL - Light Touch;WFL - Proprioception LUE Coordination: WFL - gross/fine motor Right Lower Extremity Assessment RLE ROM/Strength/Tone: WFL for tasks assessed RLE Sensation: WFL - Light Touch;WFL - Proprioception RLE Coordination: WFL - gross/fine motor Left Lower Extremity Assessment LLE ROM/Strength/Tone: WFL for tasks assessed LLE Sensation: WFL - Light Touch;WFL - Proprioception LLE Coordination: WFL - gross/fine motor Trunk Assessment Trunk Assessment: Normal     Mobility Bed Mobility Bed Mobility: Supine to Sit Supine to Sit: 6: Modified independent  (Device/Increase time) Transfers Transfers: Sit to Stand;Stand to Sit Sit to Stand: 6: Modified independent (  Device/Increase time) Stand to Sit: 6: Modified independent (Device/Increase time)     Shoulder Instructions     Exercise  digit ROM   Balance  independent   End of Session OT - End of Session Activity Tolerance: Patient tolerated treatment well Patient left: in chair;with call bell/phone within reach Nurse Communication: Mobility status;Precautions;Weight bearing status  GO Functional Assessment Tool Used: clinical judgement Functional Limitation: Self care Self Care Current Status (Z6109): At least 20 percent but less than 40 percent impaired, limited or restricted Self Care Goal Status (U0454): At least 1 percent but less than 20 percent impaired, limited or restricted Self Care Discharge Status [phone]): At least 1 percent but less than 20 percent impaired, limited or restricted   Emanuela Runnion,HILLARY 03/18/2012, 1:47 PM [facility], OTR/L  [phone] 03/18/2012

## 2012-03-19 NOTE — Progress Notes (Signed)
Pt discharged to home accompanied by family. Discharge information and rx given and explained and pt stated understanding. Pt instructed to follow up with Dr. Amanda Pea in 10 days. Pts IV was removed. Pt left unit in a stable condition via wheelchair.

## 2012-04-28 ENCOUNTER — Ambulatory Visit: Payer: Medicaid Other | Attending: Orthopedic Surgery | Admitting: Occupational Therapy

## 2012-04-28 DIAGNOSIS — M2560 Stiffness of unspecified joint, not elsewhere classified: Secondary | ICD-10-CM | POA: Insufficient documentation

## 2012-04-28 DIAGNOSIS — M6281 Muscle weakness (generalized): Secondary | ICD-10-CM | POA: Insufficient documentation

## 2012-04-28 DIAGNOSIS — IMO0001 Reserved for inherently not codable concepts without codable children: Secondary | ICD-10-CM | POA: Insufficient documentation

## 2012-04-28 DIAGNOSIS — M2550 Pain in unspecified joint: Secondary | ICD-10-CM | POA: Insufficient documentation

## 2012-04-30 ENCOUNTER — Ambulatory Visit: Payer: Medicaid Other | Admitting: Occupational Therapy

## 2012-05-05 ENCOUNTER — Ambulatory Visit: Payer: Medicaid Other | Admitting: *Deleted

## 2012-05-07 ENCOUNTER — Ambulatory Visit: Payer: Medicaid Other | Admitting: *Deleted

## 2012-05-12 ENCOUNTER — Ambulatory Visit: Payer: Medicaid Other | Admitting: Occupational Therapy

## 2012-05-15 ENCOUNTER — Ambulatory Visit: Payer: Medicaid Other | Admitting: Occupational Therapy

## 2012-05-19 ENCOUNTER — Ambulatory Visit: Payer: Medicaid Other | Admitting: Occupational Therapy

## 2012-05-22 ENCOUNTER — Ambulatory Visit: Payer: Medicaid Other | Attending: Orthopedic Surgery | Admitting: Occupational Therapy

## 2012-05-22 DIAGNOSIS — M2550 Pain in unspecified joint: Secondary | ICD-10-CM | POA: Insufficient documentation

## 2012-05-22 DIAGNOSIS — IMO0001 Reserved for inherently not codable concepts without codable children: Secondary | ICD-10-CM | POA: Insufficient documentation

## 2012-05-22 DIAGNOSIS — M6281 Muscle weakness (generalized): Secondary | ICD-10-CM | POA: Insufficient documentation

## 2012-05-22 DIAGNOSIS — M2560 Stiffness of unspecified joint, not elsewhere classified: Secondary | ICD-10-CM | POA: Insufficient documentation

## 2012-05-26 ENCOUNTER — Encounter: Payer: Medicaid Other | Admitting: Occupational Therapy

## 2012-05-29 ENCOUNTER — Ambulatory Visit: Payer: Medicaid Other | Admitting: Occupational Therapy

## 2012-06-03 ENCOUNTER — Ambulatory Visit: Payer: Medicaid Other | Admitting: Occupational Therapy

## 2012-06-05 ENCOUNTER — Ambulatory Visit: Payer: Medicaid Other | Admitting: *Deleted

## 2012-06-22 ENCOUNTER — Encounter (HOSPITAL_COMMUNITY): Payer: Self-pay

## 2012-06-22 DIAGNOSIS — K625 Hemorrhage of anus and rectum: Secondary | ICD-10-CM | POA: Insufficient documentation

## 2012-06-22 LAB — COMPREHENSIVE METABOLIC PANEL
AST: 19 U/L (ref 0–37)
CO2: 23 mEq/L (ref 19–32)
Calcium: 9.5 mg/dL (ref 8.4–10.5)
Creatinine, Ser: 0.91 mg/dL (ref 0.50–1.10)
GFR calc Af Amer: 86 mL/min — ABNORMAL LOW (ref >90)
GFR calc non Af Amer: 75 mL/min — ABNORMAL LOW (ref >90)

## 2012-06-22 LAB — CBC
MCH: 26.4 pg (ref 26.0–34.0)
MCV: 78.3 fL (ref 78.0–100.0)
Platelets: 384 10*3/uL (ref 150–400)
RBC: 4.69 MIL/uL (ref 3.87–5.11)

## 2012-06-22 LAB — TYPE AND SCREEN: Antibody Screen: NEGATIVE

## 2012-06-22 NOTE — ED Notes (Signed)
Patient presents with c/o rectal bleeding. Described as bright red. Onset about 30 minutes PTA. Patient noticed when wiping front to back after voiding. Had a bowel movement earlier today that was "loose" in consistency and DID NOT notice any bleeding then. Denies any rectal pain, itching or burning. No abd pain, nausea or vomiting. No hx hemorrhoids.

## 2012-06-23 ENCOUNTER — Emergency Department (HOSPITAL_COMMUNITY)
Admission: EM | Admit: 2012-06-23 | Discharge: 2012-06-23 | Discharge status: Against Medical Advice | Payer: Medicaid Other | Attending: Emergency Medicine | Admitting: Emergency Medicine

## 2012-06-23 LAB — ABO/RH: ABO/RH(D): A POS

## 2012-06-23 NOTE — ED Notes (Signed)
Called for pt. No response. Waited several minutes.  Still no response when name called.

## 2013-07-05 ENCOUNTER — Other Ambulatory Visit (HOSPITAL_COMMUNITY): Payer: Self-pay | Admitting: Physician Assistant

## 2013-07-05 ENCOUNTER — Ambulatory Visit (HOSPITAL_COMMUNITY)
Admission: RE | Admit: 2013-07-05 | Discharge: 2013-07-05 | Disposition: A | Discharge status: Home / Self Care | Payer: Medicaid Other | Attending: Psychiatry | Admitting: Psychiatry

## 2013-07-05 DIAGNOSIS — F39 Unspecified mood [affective] disorder: Secondary | ICD-10-CM | POA: Diagnosis present

## 2013-07-05 NOTE — BH Assessment (Signed)
Spencer completed patient's MSE

## 2013-07-05 NOTE — BH Assessment (Signed)
Tele Assessment Note   Caitlin Cohen is an 48 y.o. female that presented to [facility] as a walk in. She has a history of anxiety and depression. Says that she was also recently diagnosed with Bipolar Disorder. Patient stating that she doesn't feel she can help the help that she needs. Says there her depression is worsening with crying spells, isolating self from others, fatigue and feelings of hopelessness. She has tried getting help at [facility] but says they don't have any appointments until May 2015. Patient states that she wants to die. She admits to passive suicidal thoughts but denies suicidal intent. She is able to contract for safety and does not have access to means. She denies history of suicidal attempts/gestures. She self mutilates when frustrated by pinching or hitting herself. No stressors identified. She does however admit to increased anxiety. She denies HI and AVH's. Patient self medicates with cocaine. Says that she started using at age 28. She stopped using for several months but recently restarted her cocaine use. She uses 1 gram per use 2-3x's per week. She denies alcohol use. No history of prior hospitalizations.  Axis I: Bipolar Disorder, Depressed Mood; Anxiety Disorder NOS; and Cocaine Disorder NOS Axis II: Deferred Axis III:  Past Medical History  Diagnosis Date  . Anxiety   . Depression    Axis IV: other psychosocial or environmental problems, problems related to social environment, problems with access to health care services and problems with primary support group Axis V: 31-40 impairment in reality testing  Past Medical History:  Past Medical History  Diagnosis Date  . Anxiety   . Depression     Past Surgical History  Procedure Laterality Date  . Cholecystectomy    . Tubal ligation    . Open reduction internal fixation (orif) distal radial fracture  03/17/2012    Procedure: OPEN REDUCTION INTERNAL FIXATION (ORIF) DISTAL RADIAL  FRACTURE;  Surgeon: Roseanne Kaufman, MD;  Location: [place];  Service: Orthopedics;  Laterality: Right;  Pre-operative a supra-clavical block right arm in addition to general anesthesia    Family History: No family history on file.  Social History:  reports that she has never smoked. She has never used smokeless tobacco. She reports that she does not drink alcohol or use illicit drugs.  Additional Social History:  Alcohol / Drug Use Pain Medications: SEE MAR Prescriptions: SEE MAR Over the Counter: SEE MAR History of alcohol / drug use?: Yes Longest period of sobriety (when/how long): 7 yrs  Negative Consequences of Use: Personal relationships Substance #1 Name of Substance 1: Cocaine  1 - Age of First Use: 48 yrs old  1 - Amount (size/oz): 1 gram  1 - Frequency: 2-3x's per week 1 - Duration: on-going for several months 1 - Last Use / Amount: 07/03/2013   CIWA:   COWS:    Allergies: No Known Allergies  Home Medications:  (Not in a hospital admission)  OB/GYN Status:  No LMP recorded.  General Assessment Data Location of Assessment: [facility] Is this a Tele or Face-to-Face Assessment?: Tele Assessment Is this an Initial Assessment or a Re-assessment for this encounter?: Initial Assessment Living Arrangements: Alone Can pt return to current living arrangement?: Yes Admission Status: Voluntary Is patient capable of signing voluntary admission?: Yes Transfer from: [facility] Referral Source: Self/Family/Friend     [place] Living Arrangements: Alone Name of Psychiatrist:  (No psychiatrist; 1x recent appt at [facility]) Name of Therapist:  (  No therapist; 1 recent  appt. with [facility])  Education Status Is patient currently in school?: No  Risk to self Suicidal Ideation: No-Not Currently/Within Last 6 Months Suicidal Intent: No Is patient at risk for suicide?: No Suicidal Plan?: No Access to Means: No What has been your use of  drugs/alcohol within the last 12 months?:  (patient reports cocaine use 2-3x's per week ) Previous Attempts/Gestures: No How many times?:  (0) Other Self Harm Risks:  ("I hit or pinch myself") Triggers for Past Attempts: Other (Comment) (none reported ) Intentional Self Injurious Behavior: None (hit or pinch myself when frustrated ) Family Suicide History: Unknown Recent stressful life event(s): Other (Comment) (no stessors identified ) Persecutory voices/beliefs?: No Depression: Yes Depression Symptoms: Feeling angry/irritable;Feeling worthless/self pity;Loss of interest in usual pleasures;Guilt;Fatigue;Isolating;Tearfulness;Insomnia;Despondent Substance abuse history and/or treatment for substance abuse?: No Suicide prevention information given to non-admitted patients: Not applicable  Risk to Others Homicidal Ideation: No Thoughts of Harm to Others: No Current Homicidal Intent: No Current Homicidal Plan: No Access to Homicidal Means: No Identified Victim:  (n/a) History of harm to others?: No Assessment of Violence: None Noted Violent Behavior Description:  (patient calm and cooperative ) Does patient have access to weapons?: No Criminal Charges Pending?: No Describe Pending Criminal Charges:  (n/a) Does patient have a court date: No  Psychosis Hallucinations: None noted Delusions: None noted  Mental Status Report Appear/Hygiene: Disheveled Eye Contact: Good Motor Activity: Freedom of movement Speech: Logical/coherent Level of Consciousness: Alert Mood: Depressed Affect: Appropriate to circumstance Anxiety Level: None Thought Processes: Coherent;Relevant Judgement: Unimpaired Orientation: Person;Place;Time;Situation Obsessive Compulsive Thoughts/Behaviors: Minimal  Cognitive Functioning Concentration: Normal Memory: Recent Intact;Remote Intact IQ: Average Insight: Fair Impulse Control: Good Appetite: Fair Weight Loss:  (none reported ) Weight Gain:  (none  reported ) Sleep: Decreased Total Hours of Sleep:  (n/a) Vegetative Symptoms: None  ADLScreening [facility] Assessment Services) Patient's cognitive ability adequate to safely complete daily activities?: Yes Patient able to express need for assistance with ADLs?: Yes Independently performs ADLs?: Yes (appropriate for developmental age)  Prior Inpatient Therapy Prior Inpatient Therapy: No Prior Therapy Dates:  (n/a) Prior Therapy Facilty/Provider(s):  (n/a) Reason for Treatment:  (n/a)  Prior Outpatient Therapy Prior Outpatient Therapy: Yes Prior Therapy Dates:  ([facility]; past-[facility]) Prior Therapy Facilty/Provider(s):  ([facility] ) Reason for Treatment:  (depression, med mgmt.)  ADL Screening (condition at time of admission) Patient's cognitive ability adequate to safely complete daily activities?: Yes Is the patient deaf or have difficulty hearing?: No Does the patient have difficulty seeing, even when wearing glasses/contacts?: No Does the patient have difficulty concentrating, remembering, or making decisions?: No Patient able to express need for assistance with ADLs?: Yes Does the patient have difficulty dressing or bathing?: No Independently performs ADLs?: Yes (appropriate for developmental age) Does the patient have difficulty walking or climbing stairs?: No Weakness of Legs: None Weakness of Arms/Hands: None  Home Assistive Devices/Equipment Home Assistive Devices/Equipment: None    Abuse/Neglect Assessment (Assessment to be complete while patient is alone) Physical Abuse: Denies Verbal Abuse: Denies Sexual Abuse: Denies Exploitation of patient/patient's resources: Denies Self-Neglect: Denies Values / Beliefs Cultural Requests During Hospitalization: None Spiritual Requests During Hospitalization: None   Advance Directives (For Healthcare) Advance Directive: Patient does not have advance  directive Nutrition Screen- [facility] Adult/WL/AP Patient's home diet: Regular  Additional Information 1:1 In Past 12 Months?: No CIRT Risk: No Elopement Risk: No Does patient have medical clearance?:  Yes     Disposition:  Disposition Initial Assessment Completed for this Encounter: Yes Disposition of Patient: Referred to (CD-IOP @ [facility] or [facility] ) Patient referred to: [facility];Other (Comment);Outpatient clinic referral (Psychiatrist Appt. 08/14/13 @ [place] [employer])  Waldon Merl [facility] 07/05/2013 9:32 PM

## 2013-07-05 NOTE — H&P (Signed)
Behavioral Health Medical Screening Exam  YULISSA NEEDHAM is an 48 y.o. female.  Total Time spent with patient: 20 minutes  Psychiatric Specialty Exam: Physical Exam not performed  Review of Systems  All other systems reviewed and are negative.    There were no vitals taken for this visit.There is no weight on file to calculate BMI.  General Appearance: Casual  Eye Contact::  Good  Speech:  Clear and Coherent  Volume:  Normal  Mood:  Depressed  Affect:  Congruent  Thought Process:  Goal Directed  Orientation:  Full (Time, Place, and Person)  Thought Content:  NA  Suicidal Thoughts:  No  Homicidal Thoughts:  No  Memory:  Immediate;   Good  Judgement:  Good  Insight:  Good  Psychomotor Activity:  Normal  Concentration:  Good  Recall:  Good  Fund of Knowledge:Good  Language: Good  Akathisia:  Negative  Handed:  Right  AIMS (if indicated):     Assets:  Desire for Improvement  Sleep:       Musculoskeletal: Strength & Muscle Tone: within normal limits Gait & Station: normal Patient leans: N/A  There were no vitals taken for this visit.  Recommendations:  Based on my evaluation the patient does not appear to have an emergency medical condition. Patient is not meeting criteria for IP admission for crises mgmt, safety and stabilization. Patient to f/u with OP resources as scheduled.  SIMON,SPENCER E 07/05/2013, 10:20 PM  Reviewed the information documented and agree with the treatment plan.  Rashidah [place],JANARDHAHA R. 07/06/2013 3:25 PM

## 2017-06-03 ENCOUNTER — Other Ambulatory Visit: Payer: Self-pay | Admitting: Obstetrics and Gynecology

## 2017-06-03 DIAGNOSIS — Z1231 Encounter for screening mammogram for malignant neoplasm of breast: Secondary | ICD-10-CM

## 2017-06-17 ENCOUNTER — Ambulatory Visit (HOSPITAL_COMMUNITY)
Admission: RE | Admit: 2017-06-17 | Discharge: 2017-06-17 | Disposition: A | Discharge status: Home / Self Care | Payer: Self-pay | Source: Ambulatory Visit | Attending: Obstetrics and Gynecology | Admitting: Obstetrics and Gynecology

## 2017-06-17 ENCOUNTER — Other Ambulatory Visit (HOSPITAL_COMMUNITY): Payer: Self-pay | Admitting: *Deleted

## 2017-06-17 ENCOUNTER — Encounter (HOSPITAL_COMMUNITY): Payer: Self-pay

## 2017-06-17 VITALS — BP 142/92 | Ht 67.0 in | Wt 344.0 lb

## 2017-06-17 DIAGNOSIS — Z01419 Encounter for gynecological examination (general) (routine) without abnormal findings: Secondary | ICD-10-CM

## 2017-06-17 DIAGNOSIS — N6315 Unspecified lump in the right breast, overlapping quadrants: Secondary | ICD-10-CM

## 2017-06-17 DIAGNOSIS — N6310 Unspecified lump in the right breast, unspecified quadrant: Secondary | ICD-10-CM

## 2017-06-17 NOTE — Addendum Note (Signed)
Encounter addended by: Armond Hang, LPN on: 11/21/6771 7:36 PM  Actions taken: Order list changed

## 2017-06-17 NOTE — Patient Instructions (Signed)
Explained breast self awareness with Illene Labrador. Let patient know [facility] will cover Pap smears and HPV typing every 5 years unless has a history of abnormal Pap smears. Referred patient to the [facility] [facility] for a diagnostic mammogram and right breast ultrasound. Appointment scheduled for Monday, June 23, 2017 at 1220. Patient aware of appointment and will be there. Let patient know will follow up with her within the next couple weeks with results with results of Pap smear by letter or phone. Illene Labrador verbalized understanding.  Jamal Pavon, Arvil Chaco, RN 2:08 PM

## 2017-06-17 NOTE — Progress Notes (Signed)
Complaints of right breast lump x 1 week and pain. Patient states the pain comes and goes. Patient states the pain starts at the top of her right breast and radiates down her breast. Patient rates the pain at a 5 out of 10.  Pap Smear: Pap smear completed today. Last Pap smear was in 2016 and normal per patient. Per patient has history of two abnormal Pap smears one when she was 38-52 years old that cryotherapy was completed for follow-up and the other was in 2008 that patient states a CKC was completed. Patient states she has had at least three normal Pap smears since her CKC. No Pap smear results are in Epic.  Physical exam: Breasts Breasts symmetrical. No skin abnormalities bilateral breasts. No nipple retraction bilateral breasts. No nipple discharge bilateral breasts. No lymphadenopathy. No lumps palpated left breast. Palpated a lump within the right breast at 12 o'clock 5 cm from the nipple. No complaints of pain or tenderness on exam. Referred patient to the [facility] for a diagnostic mammogram and right breast ultrasound. Appointment scheduled for Monday, June 23, 2017 at 1220.        Pelvic/Bimanual   Ext Genitalia No lesions, no swelling and no discharge observed on external genitalia.         Vagina Vagina pink and normal texture. No lesions or discharge observed in vagina. Cystocele observed.           Cervix Cervix is present. Cervix pink and of normal texture. No discharge observed.     Uterus Uterus is present and palpable. Uterus in normal position and normal size.        Adnexae Bilateral ovaries present and palpable. No tenderness on palpation.         Rectovaginal No rectal exam completed today since patient had no rectal complaints. No skin abnormalities observed on exam.    Smoking History: Patient has never smoked.  Patient Navigation: Patient education provided. Access to services provided for patient through [place] program.   Colorectal  Cancer Screening: Per patient has never had a colonoscopy completed. No complaints today. FIT Test given to patient to complete and return to [facility].  Breast and Cervical Cancer Risk Assessment: Patient has no family history of breast cancer, known genetic mutations, or radiation treatment to the chest before age 68. Per patient has a history of cervical dysplasia. Patient has no history of being immunocompromised or DES exposure in-utero.

## 2017-06-19 LAB — CYTOLOGY - PAP
Diagnosis: NEGATIVE
HPV (WINDOPATH): DETECTED — AB

## 2017-06-20 ENCOUNTER — Encounter (HOSPITAL_COMMUNITY): Payer: Self-pay | Admitting: *Deleted

## 2017-06-22 ENCOUNTER — Other Ambulatory Visit: Payer: Self-pay

## 2017-06-23 ENCOUNTER — Ambulatory Visit
Admission: RE | Admit: 2017-06-23 | Discharge: 2017-06-23 | Disposition: A | Discharge status: Home / Self Care | Payer: No Typology Code available for payment source | Source: Ambulatory Visit | Attending: Obstetrics and Gynecology | Admitting: Obstetrics and Gynecology

## 2017-06-23 ENCOUNTER — Other Ambulatory Visit (HOSPITAL_COMMUNITY): Payer: Self-pay | Admitting: Obstetrics and Gynecology

## 2017-06-23 DIAGNOSIS — R599 Enlarged lymph nodes, unspecified: Secondary | ICD-10-CM

## 2017-06-23 DIAGNOSIS — N6310 Unspecified lump in the right breast, unspecified quadrant: Secondary | ICD-10-CM

## 2017-06-26 ENCOUNTER — Ambulatory Visit
Admission: RE | Admit: 2017-06-26 | Discharge: 2017-06-26 | Disposition: A | Discharge status: Home / Self Care | Payer: No Typology Code available for payment source | Source: Ambulatory Visit | Attending: Obstetrics and Gynecology | Admitting: Obstetrics and Gynecology

## 2017-06-26 ENCOUNTER — Other Ambulatory Visit (HOSPITAL_COMMUNITY): Payer: Self-pay | Admitting: Obstetrics and Gynecology

## 2017-06-26 DIAGNOSIS — R599 Enlarged lymph nodes, unspecified: Secondary | ICD-10-CM

## 2017-06-26 DIAGNOSIS — N6310 Unspecified lump in the right breast, unspecified quadrant: Secondary | ICD-10-CM

## 2017-06-27 ENCOUNTER — Telehealth: Payer: Self-pay | Admitting: Hematology

## 2017-06-27 NOTE — Telephone Encounter (Signed)
Spoke with patient to confirm afternoon [facility] appointment for 3/20, packet emailed to patient

## 2017-06-30 ENCOUNTER — Encounter: Payer: Self-pay | Admitting: *Deleted

## 2017-07-01 ENCOUNTER — Encounter (HOSPITAL_COMMUNITY): Payer: Self-pay

## 2017-07-01 ENCOUNTER — Other Ambulatory Visit: Payer: Self-pay | Admitting: *Deleted

## 2017-07-01 DIAGNOSIS — Z170 Estrogen receptor positive status [ER+]: Principal | ICD-10-CM

## 2017-07-01 DIAGNOSIS — C50211 Malignant neoplasm of upper-inner quadrant of right female breast: Secondary | ICD-10-CM

## 2017-07-01 LAB — FECAL OCCULT BLOOD, IMMUNOCHEMICAL: Fecal Occult Bld: NEGATIVE

## 2017-07-01 NOTE — Progress Notes (Signed)
[place]  [phone] Fax:([phone] [place] Note   Patient Care Team: Lucianne Lei, MD as PCP - General (Family Medicine) Truitt Merle, MD as Consulting Physician (Hematology) [name], MD as Consulting Physician (General Surgery) [name], MD as Attending Physician (Radiation Oncology) 07/02/2017  CHIEF COMPLAINTS/PURPOSE OF CONSULTATION:  Newly diagnosed ductal carcinoma of the right breast  Oncology History   Cancer Staging Malignant neoplasm of upper-inner quadrant of right breast in female, estrogen receptor positive ([place]) Staging form: Breast, AJCC 8th Edition - Clinical stage from 06/26/2017: Stage IIA (cT2, cN0, cM0, G3, ER: Positive, PR: Positive, HER2: Negative) - Signed by Truitt Merle, MD on 07/01/2017       Malignant neoplasm of upper-inner quadrant of right breast in female, estrogen receptor positive ([place])   06/23/2017 Mammogram    IMPRESSION: 1. There is a highly suspicious mass measuring 3.5 cm mammographically in the right breast at the palpable site identified by the patient. 2. There is 1 suspicious lymph node with cortex measuring up to 6 mm. 3.  No mammographic evidence of malignancy in the left breast.      06/26/2017 Initial Biopsy    Diagnosis 06/26/17 1. Breast, right, needle core biopsy, upper inner quadrant, 12:30 o'clock, 7cm from nipple - INVASIVE DUCTAL CARCINOMA - SEE COMMENT 2. Lymph node, needle/core biopsy, right axillary (level 2 node) - NO CARCINOMA IDENTIFIED IN ONE LYMPH NODE (0/1)      06/26/2017 Receptors her2    Prognostic indicators significant for: ER, 70% positive and PR, 70% positive, both with strong staining intensity. Proliferation marker Ki67 at 60%. HER2 negative.      07/01/2017 Initial Diagnosis    Malignant neoplasm of upper-inner quadrant of right breast in female, estrogen receptor positive ([place])       HISTORY OF PRESENTING ILLNESS:  Caitlin Cohen 52 y.o.  female is a here because of newly diagnosed breast cancer. The patient was referred by Dr. Elly Modena. The patient presents to the clinic today by herself.  Prior to pt's abnormal mammogram she states she felt the lump which prompted her to get screening 1 month ago. Her last mammogram was in 2008 and was otherwise normal.   Pt's mammogram from 06/23/17 revealed a highly suspicious mass measuring 3.5 cm mammographically in the right breast at the palpable site identified by the patient.There was also a 1 suspicious lymph node with cortex measuring up to 6 Mm. Her biopsy confirmed invasive ductal carcinoma and had reassuring results of no carcinoma identified in the lymph node.   Prognostic indicators significant for: ER, 70% positive and PR, 70% positive, both with strong staining intensity. Proliferation marker Ki67 at 60%. HER2 negative.  GYN HISTORY  Menarchal: xx LMP: xx Contraceptive: not currently, oral birth control in the past  HRT:  GP: 3:3 first at 52 years old   Today the patient notes she does have some pain due to biopsy. Pt denies hot flash. She still has a period but it is irregular. Her last period was over 1 month ago.    Pt has a PMHx of Depression for which she use to take medication. She states she is not feeling overwhelmed right now just mildly anxious. She has a past surgical history of cholecystectomy, tubal ligation, right wrist fracture. She reports she previously had part of her cervix removed due to discovery of cancer cells.   Pt does not have any FHx of Cancer that she knows of. Pt denies tobacco  use and she drinks alcohol occassionally.   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 All three of her children live in [place].   MEDICAL HISTORY:  Past Medical History:  Diagnosis Date  . Anxiety   . Depression     SURGICAL HISTORY: Past Surgical History:  Procedure Laterality Date  . CHOLECYSTECTOMY    . OPEN REDUCTION INTERNAL FIXATION (ORIF) DISTAL  RADIAL FRACTURE  03/17/2012   Procedure: OPEN REDUCTION INTERNAL FIXATION (ORIF) DISTAL RADIAL FRACTURE;  Surgeon: Roseanne Kaufman, MD;  Location: [facility];  Service: Orthopedics;  Laterality: Right;  Pre-operative a supra-clavical block right arm in addition to general anesthesia  . TUBAL LIGATION      SOCIAL HISTORY: Social History   Socioeconomic History  . Marital status: Single    Spouse name: Not on file  . Number of children: Not on file  . Years of education: Not on file  . Highest education level: Not on file  Social Needs  . Financial resource strain: Not on file  . Food insecurity - worry: Not on file  . Food insecurity - inability: Not on file  . Transportation needs - medical: Not on file  . Transportation needs - non-medical: Not on file  Occupational History  . Not on file  Tobacco Use  . Smoking status: Never Smoker  . Smokeless tobacco: Never Used  Substance and Sexual Activity  . Alcohol use: Yes    Comment: occassionally  . Drug use: No  . Sexual activity: Yes    Birth control/protection: Surgical  Other Topics Concern  . Not on file  Social History Narrative   Lives with roommate   Works fulltime but seasonal work    FAMILY HISTORY: Family History  Problem Relation Age of Onset  . Hypertension Mother   . Diabetes Father   . Hypertension Father     ALLERGIES:  has No Known Allergies.  MEDICATIONS:  No current outpatient medications on file.   No current facility-administered medications for this visit.     REVIEW OF SYSTEMS:   Constitutional: Denies fevers, chills or abnormal night sweats Eyes: Denies blurriness of vision, double vision or watery eyes Ears, nose, mouth, throat, and face: Denies mucositis or sore throat Respiratory: Denies cough, dyspnea or wheezes Cardiovascular: Denies palpitation, chest discomfort or lower extremity swelling Gastrointestinal:  Denies nausea, heartburn or change in bowel habits Skin: Denies abnormal skin  rashes Lymphatics: Denies new lymphadenopathy or easy bruising Neurological:Denies numbness, tingling or new weaknesses Behavioral/Psych: Mood is stable, no new changes  Breast: (+) right breast soreness from biopsy All other systems were reviewed with the patient and are negative.  PHYSICAL EXAMINATION: ECOG PERFORMANCE STATUS: 0 - Asymptomatic GENERAL:alert, no distress and comfortable SKIN: skin color, texture, turgor are normal, no rashes or significant lesions EYES: normal, conjunctiva are pink and non-injected, sclera clear OROPHARYNX:no exudate, no erythema and lips, buccal mucosa, and tongue normal  NECK: supple, thyroid normal size, non-tender, without nodularity LYMPH:  no palpable lymphadenopathy in the cervical, axillary or inguinal LUNGS: clear to auscultation and percussion with normal breathing effort HEART: regular rate & rhythm and no murmurs and no lower extremity edema ABDOMEN:abdomen soft, non-tender and normal bowel sounds Musculoskeletal:no cyanosis of digits and no clubbing  PSYCH: alert & oriented x 3 with fluent speech NEURO: no focal motor/sensory deficits Breast: Breast inspection showed them to be symmetrical with no nipple discharge. (+) She has a 2.5 by 3 cm palpable mass at the 11 O'clock position  on the right breast.  Exam of the left breast and bilateral axilla were negative for palpable mass or adenopathy.  LABORATORY DATA:  I have reviewed the data as listed CBC Latest Ref Rng & Units 07/02/2017 06/22/2012 03/17/2012  WBC 3.9 - 10.3 K/uL 9.1 13.9(H) 9.0  Hemoglobin 12.0 - 15.0 g/dL - 12.4 12.1  Hematocrit 34.8 - 46.6 % 40.0 36.7 37.0  Platelets 145 - 400 K/uL 293 384 349    CMP Latest Ref Rng & Units 07/02/2017 06/22/2012  Glucose 70 - 140 mg/dL 109 102(H)  BUN 7 - 26 mg/dL 14 15  Creatinine 0.60 - 1.10 mg/dL 1.13(H) 0.91  Sodium 136 - 145 mmol/L 141 137  Potassium 3.5 - 5.1 mmol/L 4.1 4.2  Chloride 98 - 109 mmol/L 105 104  CO2 22 - 29 mmol/L 28 23   Calcium 8.4 - 10.4 mg/dL 9.5 9.5  Total Protein 6.4 - 8.3 g/dL 8.4(H) 8.6(H)  Total Bilirubin 0.2 - 1.2 mg/dL 0.3 0.2(L)  Alkaline Phos 40 - 150 U/L 90 79  AST 5 - 34 U/L 22 19  ALT 0 - 55 U/L 18 12   PATHOLOGY  Diagnosis 06/26/17 1. Breast, right, needle core biopsy, upper inner quadrant, 12:30 o'clock, 7cm from nipple - INVASIVE DUCTAL CARCINOMA - SEE COMMENT 2. Lymph node, needle/core biopsy, right axillary (level 2 node) - NO CARCINOMA IDENTIFIED IN ONE LYMPH NODE (0/1) Results: HER2 - NEGATIVE RATIO OF HER2/CEP17 SIGNALS 1.21 AVERAGE HER2 COPY NUMBER PER CELL 2.00 Results: IMMUNOHISTOCHEMICAL AND MORPHOMETRIC ANALYSIS PERFORMED MANUALLY Estrogen Receptor: 70%, POSITIVE, STRONG STAINING INTENSITY Progesterone Receptor: 70%, POSITIVE, STRONG STAINING INTENSITY Proliferation Marker Ki67: 60%  RADIOGRAPHIC STUDIES: I have personally reviewed the radiological images as listed and agreed with the findings in the report.  Diagnostic Mammogram 06/23/17 IMPRESSION: 1. There is a highly suspicious mass measuring 3.5 cm mammographically in the right breast at the palpable site identified by the patient. 2. There is 1 suspicious lymph node with cortex measuring up to 6 mm. 3.  No mammographic evidence of malignancy in the left breast.   US Breast Ltd Uni Right Inc Axilla  Result Date: 06/23/2017 CLINICAL DATA:  52 year old female presenting for evaluation of a palpable mass in the right breast. EXAM: DIGITAL DIAGNOSTIC BILATERAL MAMMOGRAM WITH CAD AND TOMO RIGHT BREAST ULTRASOUND COMPARISON:  Previous exam(s). ACR Breast Density Category b: There are scattered areas of fibroglandular density. FINDINGS: Deep to the palpable marker in the upper slightly outer quadrant of the right breast, there is an irregular mass with spiculated margins measuring approximately 3.5 cm. No other suspicious calcifications, masses or areas of distortion are seen in the bilateral breasts. Mammographic  images were processed with CAD. Ultrasound targeted to the palpable site at 12 o'clock, 7 cm from the nipple demonstrates a mass with heterogeneous echotexture an indistinct margins measuring approximately 2.8 x 2.5 x 2.9 cm. In the right axilla there is 1 lymph node with a thickened cortex measuring up to 6 mm. IMPRESSION: 1. There is a highly suspicious mass measuring 3.5 cm mammographically in the right breast at the palpable site identified by the patient. 2. There is 1 suspicious lymph node with cortex measuring up to 6 mm. 3.  No mammographic evidence of malignancy in the left breast. RECOMMENDATION: 1. Ultrasound-guided biopsy is recommended for the right breast mass and for the thickened right axillary lymph node. This has been scheduled for 06/26/2017 at 1:45 p.m. I have discussed the findings and recommendations with the patient. Results were  also provided in writing at the conclusion of the visit. If applicable, a reminder letter will be sent to the patient regarding the next appointment. BI-RADS CATEGORY  5: Highly suggestive of malignancy. Electronically Signed   By: Ammie Ferrier M.D.   On: 06/23/2017 14:22   Mm Diag Breast Tomo Bilateral  Result Date: 06/23/2017 CLINICAL DATA:  52 year old female presenting for evaluation of a palpable mass in the right breast. EXAM: DIGITAL DIAGNOSTIC BILATERAL MAMMOGRAM WITH CAD AND TOMO RIGHT BREAST ULTRASOUND COMPARISON:  Previous exam(s). ACR Breast Density Category b: There are scattered areas of fibroglandular density. FINDINGS: Deep to the palpable marker in the upper slightly outer quadrant of the right breast, there is an irregular mass with spiculated margins measuring approximately 3.5 cm. No other suspicious calcifications, masses or areas of distortion are seen in the bilateral breasts. Mammographic images were processed with CAD. Ultrasound targeted to the palpable site at 12 o'clock, 7 cm from the nipple demonstrates a mass with heterogeneous  echotexture an indistinct margins measuring approximately 2.8 x 2.5 x 2.9 cm. In the right axilla there is 1 lymph node with a thickened cortex measuring up to 6 mm. IMPRESSION: 1. There is a highly suspicious mass measuring 3.5 cm mammographically in the right breast at the palpable site identified by the patient. 2. There is 1 suspicious lymph node with cortex measuring up to 6 mm. 3.  No mammographic evidence of malignancy in the left breast. RECOMMENDATION: 1. Ultrasound-guided biopsy is recommended for the right breast mass and for the thickened right axillary lymph node. This has been scheduled for 06/26/2017 at 1:45 p.m. I have discussed the findings and recommendations with the patient. Results were also provided in writing at the conclusion of the visit. If applicable, a reminder letter will be sent to the patient regarding the next appointment. BI-RADS CATEGORY  5: Highly suggestive of malignancy. Electronically Signed   By: Ammie Ferrier M.D.   On: 06/23/2017 14:22   [place] Axillary Node Core Biopsy Right  Addendum Date: 06/27/2017   ADDENDUM REPORT: 06/27/2017 13:04 ADDENDUM: Pathology revealed GRADE III INVASIVE DUCTAL CARCINOMA of the Right breast, upper inner quadrant, 12:30 o'clock, 7 cm from nipple. NO CARCINOMA IDENTIFIED IN the Right axillary lymph node (level 2 node). This was found to be concordant by Dr. Peggye Fothergill. Pathology results were discussed with the patient by telephone. The patient reported doing well after the biopsies with tenderness at the sites. Post biopsy instructions and care were reviewed and questions were answered. The patient was encouraged to call [facility] for any additional concerns. The patient was referred to [facility] on July 02, 2017. Pathology results reported by Terie Purser, RN on 06/27/2017. Electronically Signed   By: Evangeline Dakin M.D.   On:  06/27/2017 13:04   Result Date: 06/27/2017 CLINICAL DATA:  52 year old presenting with a palpable lump in the upper right breast, shown on diagnostic imaging have a suspicious approximate 2.9 cm mass at the 12 o'clock location approximately 7 cm from. Patient also has a solitary right axillary lymph node demonstrating focal cortical thickening to 6 mm. EXAM: ULTRASOUND GUIDED CORE NEEDLE BIOPSY OF A RIGHT AXILLARY NODE COMPARISON:  Previous exam(s). FINDINGS: I met with the patient and we discussed the procedure of ultrasound-guided biopsy, including benefits and alternatives. We discussed the high likelihood of a successful procedure. We discussed the risks of the procedure, including infection, bleeding, tissue  injury, clip migration, and inadequate sampling. Informed written consent was given. The usual time-out protocol was performed immediately prior to the procedure. Using sterile technique with chlorhexidine as skin antisepsis 1% Lidocaine as local anesthetic, under direct ultrasound visualization, a 14 gauge Bard Marquee core needle device placed through a 13 gauge introducer needle was used to perform biopsy of the deep right axillary lymph node demonstrating focal cortical thickening using a lateral approach. At the conclusion of the procedure a HydroMark spiral shaped tissue marker clip was deployed into the biopsy cavity. Follow up 2 view mammogram was performed and dictated separately. IMPRESSION: Ultrasound guided biopsy of a deep right axillary lymph node demonstrating focal cortical thickening. No apparent complications. Electronically Signed: By: Evangeline Dakin M.D. On: 06/26/2017 14:51   Mm Clip Placement Right  Result Date: 06/26/2017 CLINICAL DATA:  Confirmation of clip placement after ultrasound-guided core needle biopsy of a suspicious mass involving the upper inner quadrant of the right breast at middle depth and biopsy of a solitary right axillary lymph node with cortical thickening  up to 6 mm. EXAM: DIAGNOSTIC RIGHT MAMMOGRAM POST ULTRASOUND BIOPSY COMPARISON:  Previous exam(s). FINDINGS: Mammographic images were obtained following ultrasound guided biopsy of a mass involving the upper inner quadrant of the right breast and biopsy of a right axillary lymph node with focal cortical thickening. The ribbon shaped tissue marker clip is appropriately positioned within the anterior portion of biopsied mass in the upper inner quadrant. The [facility] spiral shaped tissue marker clip is appropriately positioned within the right axillary lymph node with focal cortical thickening. Expected post biopsy changes are present at both locations without evidence of hematoma. IMPRESSION: 1. Appropriate positioning of the ribbon shaped tissue marker clip within the biopsied mass in the upper inner quadrant of the right breast at middle depth. 2. Appropriate positioning of the HydroMark spiral shaped tissue marker clip within the biopsied right axillary lymph node demonstrating focal cortical thickening. Final Assessment: Post Procedure Mammograms for Marker Placement Electronically Signed   By: Evangeline Dakin M.D.   On: 06/26/2017 14:52   [place] Rt Breast Bx W Loc Dev 1st Lesion Img Bx Spec US Guide  Addendum Date: 06/27/2017   ADDENDUM REPORT: 06/27/2017 13:05 ADDENDUM: Pathology revealed GRADE III INVASIVE DUCTAL CARCINOMA of the Right breast, upper inner quadrant, 12:30 o'clock, 7 cm from nipple. NO CARCINOMA IDENTIFIED IN the Right axillary lymph node (level 2 node). This was found to be concordant by Dr. Peggye Fothergill. Pathology results were discussed with the patient by telephone. The patient reported doing well after the biopsies with tenderness at the sites. Post biopsy instructions and care were reviewed and questions were answered. The patient was encouraged to call [facility] for any additional concerns. The patient was referred to [facility] on July 02, 2017. Pathology results reported by Terie Purser, RN on 06/27/2017. Electronically Signed   By: Evangeline Dakin M.D.   On: 06/27/2017 13:05   Result Date: 06/27/2017 CLINICAL DATA:  52 year old presenting with a palpable lump in the upper inner quadrant of the right breast, shown on diagnostic imaging have a suspicious approximate 2.9 cm mass at the 12:30 o'clock location approximately 7 cm from the nipple (at middle depth). Patient also has a solitary right axillary lymph node demonstrating focal cortical thickening to 6 mm. EXAM: ULTRASOUND GUIDED RIGHT BREAST CORE NEEDLE BIOPSY COMPARISON:  Previous exam(s). FINDINGS: I met with the patient  and we discussed the procedure of ultrasound-guided biopsy, including benefits and alternatives. We discussed the high likelihood of a successful procedure. We discussed the risks of the procedure, including infection, bleeding, tissue injury, clip migration, and inadequate sampling. Informed written consent was given. The usual time-out protocol was performed immediately prior to the procedure. Lesion quadrant: Upper inner quadrant. Using sterile technique with chlorhexidine as skin antisepsis, 1% lidocaine and 1% lidocaine with epinephrine as local anesthetic, under direct ultrasound visualization, a 12 gauge Bard Marquee core needle device was used to perform biopsy of the mass involving the upper inner quadrant of the right breast using a lateral approach. At the conclusion of the procedure a ribbon shaped tissue marker clip was deployed into the biopsy cavity. Follow up 2 view mammogram was performed and dictated separately. IMPRESSION: Ultrasound guided biopsy of a mass involving the upper inner quadrant of the right breast at the 12:30 o'clock position. No apparent complications. Electronically Signed: By: Evangeline Dakin M.D. On: 06/26/2017 14:49    ASSESSMENT & PLAN:  52 y.o.  pre-menopausal woman, presented with screening discovered to Ductal carcinoma  1.  Malignant neoplasm of the upper-ourter quadrant of right breast, base of ductal carcinoma, cT2N0M0, stage IIA, grade 3, ER+ /PR +, HER2 - -I discussed her breast imaging and needle biopsy results with patient and her family members in great detail. -She is a candidate for breast conservation surgery. She has been seen by breast surgeon Dr. Donne Hazel, who recommends lumpectomy and sentinel node biopsy.  -The risk of recurrence depends on the stage and biology of the tumor. She is IIA stage, with ER/PR postive and HER2 negative. Her tumor is Grade 3 with high Ki67 is 60%.  This is a possible high risk disease.     -I recommend a Oncotype Dx test on the surgical sample and we'll make a decision about adjuvant chemotherapy based on the Oncotype result. Written material of this test was given to her. She is young and fit, would be a good candidate for chemotherapy if her Oncotype recurrence score is high. -If her surgical sentinel lymph node positive, I recommend mammaprint for further risk stratification and guide adjuvant chemotherapy. -She was also seen by radiation oncologist Dr. Isidore Moos today. She will benefit from breast radiation after she undergoes a lumpectomy to decrease the risk of breast cancer. -Given her strongly positive ER and PR and pre-menopausal status, I  recommend antiestrogen therapy with Tamoxifen, for a total of 10 years to reduce the risk of cancer recurrence. I also discussed that if she becomes menopausal, I can switch her to an aromatase inhibitor. ---The potential side effects, which includes but not limited to, hot flash, skin and vaginal dryness, slightly increased risk of cardiovascular disease and cataract, small risk of thrombosis and endometrial cancer, were discussed with her in great details. Preventive strategies for thrombosis, such as being physically active, using compression stocks, avoid  cigarette smoking, etc., were reviewed with her. I also recommend her to follow-up with her gynecologist once a year, and watch for vaginal spotting or bleeding, as a clinically sign of endometrial cancer, etc. She voiced good understanding, and is interested. Will start after she completes adjuvant breast radiation. -Alternative adjuvant endocrine therapy, such as aromatase inhibitor and overexpression were discussed with her also, she is perimenopausal, probably not much additional benefit for ovarian suppression. -We also reviewed the breast cancer surveillance, including annual mammogram, and physical exam.   -Once I have her Oncotype results and pathology results, I  will make a decision about her adjuvant chemotherapy. If oncotype shows low risk disease, I will follow up with her towards the end of radiation to discuss anti-estrogen therapy. If oncotype shows high risk disease I will see her back 3-4 weeks after surgery to discuss adjuvant chemotherapy options.   2. Depression/Anxiety -She has a hx of depression and was previously treated with medication. Not currently on medication and she feels that her depression is controlled. Mildly anxious about her diagnosis -I offered social work services today and she agrees  3. Cancer Screening -She reports she recently did cologuard and has not received the results back yet   Plan -Lumpectomy and sentinel lymph node biopsy with Dr. Donne Hazel soon  -Oncotype testing on her surgical sample, or mammaprint if node positive, she will be notified of results with next step for follow up.  -Social worker referral for her anxiety and depression    No orders of the defined types were placed in this encounter.   All questions were answered. The patient knows to call the clinic with any problems, questions or concerns. I spent 45 minutes counseling the patient face to face. The total time spent in the appointment was 60 minutes and more than 50% was on  counseling.   This document serves as a record of services personally performed by Truitt Merle, MD. It was created on her behalf by Theresia Bough, a trained medical scribe. The creation of this record is based on the scribe's personal observations and the provider's statements to them.   I have reviewed the above documentation for accuracy and completeness, and I agree with the above.    Truitt Merle, MD 07/02/2017 5:52 PM

## 2017-07-02 ENCOUNTER — Encounter: Payer: Self-pay | Admitting: Hematology

## 2017-07-02 ENCOUNTER — Encounter: Payer: Self-pay | Admitting: Radiation Oncology

## 2017-07-02 ENCOUNTER — Ambulatory Visit: Payer: BLUE CROSS/BLUE SHIELD | Attending: General Surgery | Admitting: Physical Therapy

## 2017-07-02 ENCOUNTER — Inpatient Hospital Stay: Payer: BLUE CROSS/BLUE SHIELD | Attending: Hematology | Admitting: Hematology

## 2017-07-02 ENCOUNTER — Encounter: Payer: Self-pay | Admitting: General Practice

## 2017-07-02 ENCOUNTER — Encounter: Payer: Self-pay | Admitting: Physical Therapy

## 2017-07-02 ENCOUNTER — Other Ambulatory Visit: Payer: Self-pay | Admitting: General Surgery

## 2017-07-02 ENCOUNTER — Inpatient Hospital Stay: Payer: BLUE CROSS/BLUE SHIELD

## 2017-07-02 ENCOUNTER — Ambulatory Visit
Admission: RE | Admit: 2017-07-02 | Discharge: 2017-07-02 | Disposition: A | Discharge status: Home / Self Care | Payer: Self-pay | Source: Ambulatory Visit | Attending: Radiation Oncology | Admitting: Radiation Oncology

## 2017-07-02 ENCOUNTER — Other Ambulatory Visit: Payer: Self-pay

## 2017-07-02 DIAGNOSIS — C50211 Malignant neoplasm of upper-inner quadrant of right female breast: Secondary | ICD-10-CM

## 2017-07-02 DIAGNOSIS — Z170 Estrogen receptor positive status [ER+]: Principal | ICD-10-CM

## 2017-07-02 DIAGNOSIS — F39 Unspecified mood [affective] disorder: Secondary | ICD-10-CM

## 2017-07-02 DIAGNOSIS — R293 Abnormal posture: Secondary | ICD-10-CM | POA: Diagnosis present

## 2017-07-02 DIAGNOSIS — C50411 Malignant neoplasm of upper-outer quadrant of right female breast: Secondary | ICD-10-CM

## 2017-07-02 DIAGNOSIS — F418 Other specified anxiety disorders: Secondary | ICD-10-CM | POA: Diagnosis not present

## 2017-07-02 LAB — CMP (CANCER CENTER ONLY)
ALT: 18 U/L (ref 0–55)
ANION GAP: 8 (ref 3–11)
AST: 22 U/L (ref 5–34)
Albumin: 3.5 g/dL (ref 3.5–5.0)
Alkaline Phosphatase: 90 U/L (ref 40–150)
BUN: 14 mg/dL (ref 7–26)
CHLORIDE: 105 mmol/L (ref 98–109)
CO2: 28 mmol/L (ref 22–29)
CREATININE: 1.13 mg/dL — AB (ref 0.60–1.10)
Calcium: 9.5 mg/dL (ref 8.4–10.4)
GFR, EST NON AFRICAN AMERICAN: 55 mL/min — AB (ref >60)
Glucose, Bld: 109 mg/dL (ref 70–140)
POTASSIUM: 4.1 mmol/L (ref 3.5–5.1)
SODIUM: 141 mmol/L (ref 136–145)
Total Bilirubin: 0.3 mg/dL (ref 0.2–1.2)
Total Protein: 8.4 g/dL — ABNORMAL HIGH (ref 6.4–8.3)

## 2017-07-02 LAB — CBC WITH DIFFERENTIAL (CANCER CENTER ONLY)
Basophils Absolute: 0.1 10*3/uL (ref 0.0–0.1)
Basophils Relative: 1 %
EOS ABS: 0.1 10*3/uL (ref 0.0–0.5)
Eosinophils Relative: 2 %
HEMATOCRIT: 40 % (ref 34.8–46.6)
Hemoglobin: 13 g/dL (ref 11.6–15.9)
LYMPHS ABS: 3 10*3/uL (ref 0.9–3.3)
LYMPHS PCT: 33 %
MCH: 27.2 pg (ref 25.1–34.0)
MCHC: 32.5 g/dL (ref 31.5–36.0)
MCV: 83.7 fL (ref 79.5–101.0)
MONOS PCT: 5 %
Monocytes Absolute: 0.4 10*3/uL (ref 0.1–0.9)
NEUTROS PCT: 59 %
Neutro Abs: 5.5 10*3/uL (ref 1.5–6.5)
Platelet Count: 293 10*3/uL (ref 145–400)
RBC: 4.77 MIL/uL (ref 3.70–5.45)
RDW: 15 % — ABNORMAL HIGH (ref 11.2–14.5)
WBC: 9.1 10*3/uL (ref 3.9–10.3)

## 2017-07-02 NOTE — Patient Instructions (Signed)

## 2017-07-02 NOTE — Progress Notes (Signed)
Radiation Oncology         [phone] ________________________________  Initial Outpatient Consultation  Name: Caitlin Cohen MRN: [id number]  Date: 07/02/2017  DOB: [birth year]  PX:[id number], Caitlin Rude, MD  [name], MD   REFERRING PHYSICIAN: [name], MD  DIAGNOSIS:    ICD-10-CM   1. 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 female who presented with a self-palpated mass in the right breast. She was evaluated with bilateral diagnostic mammogram and ultrasound on 06/23/2017 that showed a mass at the palpable site in the right breast measuring 3.5 cm. On ultrasound there was 1 suspicious lymph node with cortex measuring up to 6 mm. Right breast mass was 2.9cm on [place].  No mammographic evidence of malignancy in the left breast. Biopsy on 06/26/2017 showed invasive ductal carcinoma with characteristics as described above in the diagnosis. The right axillary lymph node was negative.  The patient presents to our [facility] today for evaluation and discussion of potential treatment options. On review of systems, she is positive for loss of sleep, sinus problems, coughing, and breast lump.   Gynecologic/Obstretric History: She had her first menstrual period at age 75. She is still menstruating, with her last period occurring in February 2019. She reports  her periods are not regular. She has carried 3 children to term with her first live birth at age 31. She used birth control pills or hormone shots for contraception over 23 years ago after the birth of her 25rd child.   PREVIOUS RADIATION THERAPY: No  PAST MEDICAL HISTORY:  has a past medical history of Anxiety and Depression.    PAST SURGICAL HISTORY: Past Surgical History:  Procedure Laterality Date  . CHOLECYSTECTOMY    . OPEN REDUCTION INTERNAL FIXATION (ORIF) DISTAL RADIAL FRACTURE  03/17/2012   Procedure: OPEN REDUCTION INTERNAL FIXATION (ORIF) DISTAL RADIAL FRACTURE;  Surgeon: Roseanne Kaufman, MD;  Location: [facility];  Service: Orthopedics;  Laterality: Right;  Pre-operative a supra-clavical block right arm in addition to general anesthesia  . TUBAL LIGATION      FAMILY HISTORY: family history includes Diabetes in her father; Hypertension in her father and mother.  SOCIAL HISTORY:  reports that  has never smoked. she has never used smokeless tobacco. She reports that she drinks alcohol. She reports that she does not use drugs.  ALLERGIES: Patient has no known allergies.  MEDICATIONS:  No current outpatient medications on file.   No current facility-administered medications for this encounter.     REVIEW OF SYSTEMS: A 10+ POINT REVIEW OF SYSTEMS WAS OBTAINED including neurology, dermatology, psychiatry, cardiac, respiratory, lymph, extremities, GI, GU, Musculoskeletal, constitutional, breasts, reproductive, HEENT.  All pertinent positives are noted in the HPI.  All others are negative.   PHYSICAL EXAM:  General: Alert and oriented, in no acute distress. HEENT: Head is normocephalic. Extraocular movements are intact. Oropharynx is clear. Neck: Neck is supple, no palpable cervical  or supraclavicular lymphadenopathy. Heart: Regular in rate and rhythm with no murmurs, rubs, or gallops. Chest: Clear to auscultation bilaterally, with no rhonchi, wheezes, or rales. Abdomen: Soft,  nontender, nondistended, with no rigidity or guarding. Extremities: She had nonpitting edema in her bilateral ankles. Lymphatics: see Neck Exam Skin: No concerning lesions. Musculoskeletal: Symmetric strength and muscle tone throughout. Neurologic: Cranial nerves II through XII are grossly intact. No obvious focalities. Speech is fluent. Coordination is intact. Psychiatric: Judgment and insight are intact. Affect is appropriate. Breasts: She had a palpable mass around 12:00 in the right breast which was at least 7 cm in dimension, likely related to post-biopsy changes. No other palpable masses appreciated in the breasts or axillae .    ECOG = 0  0 - Asymptomatic (Fully active, able to carry on all predisease activities without restriction)  1 - Symptomatic but completely ambulatory (Restricted in physically strenuous activity but ambulatory and able to carry out work of a light or sedentary nature. For example, light housework, office work)  2 - Symptomatic, <50% in bed during the day (Ambulatory and capable of all self care but unable to carry out any work activities. Up and about more than 50% of waking hours)  3 - Symptomatic, >50% in bed, but not bedbound (Capable of only limited self-care, confined to bed or chair 50% or more of waking hours)  4 - Bedbound (Completely disabled. Cannot carry on any self-care. Totally confined to bed or chair)  5 - Death   Eustace Pen MM, Creech RH, Tormey DC, et al. [phone]). "Toxicity and response criteria of the [facility]". [place] Oncol. 5 (6): 649-55   LABORATORY DATA:  Lab Results  Component Value Date   WBC 9.1 07/02/2017   HGB 12.4 06/22/2012   HCT 40.0 07/02/2017   MCV 83.7 07/02/2017   PLT 293 07/02/2017   CMP     Component Value Date/Time   NA 141 07/02/2017 1219   K 4.1 07/02/2017 1219   CL 105 07/02/2017 1219   CO2 28 07/02/2017 1219   GLUCOSE 109 07/02/2017 1219   BUN 14 07/02/2017 1219   CREATININE  1.13 (H) 07/02/2017 1219   CALCIUM 9.5 07/02/2017 1219   PROT 8.4 (H) 07/02/2017 1219   ALBUMIN 3.5 07/02/2017 1219   AST 22 07/02/2017 1219   ALT 18 07/02/2017 1219   ALKPHOS 90 07/02/2017 1219   BILITOT 0.3 07/02/2017 1219   GFRNONAA 55 (L) 07/02/2017 1219   GFRAA >60 07/02/2017 1219         RADIOGRAPHY: US Breast Ltd Uni Right Inc Axilla  Result Date: 06/23/2017 CLINICAL DATA:  52 year old female presenting for evaluation of a palpable mass in the right breast. EXAM: DIGITAL DIAGNOSTIC BILATERAL MAMMOGRAM WITH CAD AND TOMO RIGHT BREAST ULTRASOUND COMPARISON:  Previous exam(s). ACR Breast Density Category b: There are scattered areas of fibroglandular density. FINDINGS: Deep to the palpable marker in the upper slightly outer quadrant of the right breast, there is an irregular mass with spiculated margins measuring approximately 3.5 cm. No other suspicious calcifications, masses or areas of distortion are seen in the bilateral breasts. Mammographic images were processed with CAD. Ultrasound targeted to the palpable site at 12 o'clock, 7 cm from the nipple demonstrates a mass with heterogeneous echotexture an indistinct margins measuring approximately 2.8 x 2.5 x 2.9 cm. In the right axilla there is 1 lymph node with a thickened cortex measuring up to 6 mm. IMPRESSION: 1. There is a highly suspicious  mass measuring 3.5 cm mammographically in the right breast at the palpable site identified by the patient. 2. There is 1 suspicious lymph node with cortex measuring up to 6 mm. 3.  No mammographic evidence of malignancy in the left breast. RECOMMENDATION: 1. Ultrasound-guided biopsy is recommended for the right breast mass and for the thickened right axillary lymph node. This has been scheduled for 06/26/2017 at 1:45 p.m. I have discussed the findings and recommendations with the patient. Results were also provided in writing at the conclusion of the visit. If applicable, a reminder letter will be sent  to the patient regarding the next appointment. BI-RADS CATEGORY  5: Highly suggestive of malignancy. Electronically Signed   By: Ammie Ferrier M.D.   On: 06/23/2017 14:22   Mm Diag Breast Tomo Bilateral  Result Date: 06/23/2017 CLINICAL DATA:  52 year old female presenting for evaluation of a palpable mass in the right breast. EXAM: DIGITAL DIAGNOSTIC BILATERAL MAMMOGRAM WITH CAD AND TOMO RIGHT BREAST ULTRASOUND COMPARISON:  Previous exam(s). ACR Breast Density Category b: There are scattered areas of fibroglandular density. FINDINGS: Deep to the palpable marker in the upper slightly outer quadrant of the right breast, there is an irregular mass with spiculated margins measuring approximately 3.5 cm. No other suspicious calcifications, masses or areas of distortion are seen in the bilateral breasts. Mammographic images were processed with CAD. Ultrasound targeted to the palpable site at 12 o'clock, 7 cm from the nipple demonstrates a mass with heterogeneous echotexture an indistinct margins measuring approximately 2.8 x 2.5 x 2.9 cm. In the right axilla there is 1 lymph node with a thickened cortex measuring up to 6 mm. IMPRESSION: 1. There is a highly suspicious mass measuring 3.5 cm mammographically in the right breast at the palpable site identified by the patient. 2. There is 1 suspicious lymph node with cortex measuring up to 6 mm. 3.  No mammographic evidence of malignancy in the left breast. RECOMMENDATION: 1. Ultrasound-guided biopsy is recommended for the right breast mass and for the thickened right axillary lymph node. This has been scheduled for 06/26/2017 at 1:45 p.m. I have discussed the findings and recommendations with the patient. Results were also provided in writing at the conclusion of the visit. If applicable, a reminder letter will be sent to the patient regarding the next appointment. BI-RADS CATEGORY  5: Highly suggestive of malignancy. Electronically Signed   By: Ammie Ferrier  M.D.   On: 06/23/2017 14:22   [place] Axillary Node Core Biopsy Right  Addendum Date: 06/27/2017   ADDENDUM REPORT: 06/27/2017 13:04 ADDENDUM: Pathology revealed GRADE III INVASIVE DUCTAL CARCINOMA of the Right breast, upper inner quadrant, 12:30 o'clock, 7 cm from nipple. NO CARCINOMA IDENTIFIED IN the Right axillary lymph node (level 2 node). This was found to be concordant by Dr. Peggye Fothergill. Pathology results were discussed with the patient by telephone. The patient reported doing well after the biopsies with tenderness at the sites. Post biopsy instructions and care were reviewed and questions were answered. The patient was encouraged to call [facility] for any additional concerns. The patient was referred to [facility] at [facility] on July 02, 2017. Pathology results reported by Terie Purser, RN on 06/27/2017. Electronically Signed   By: Evangeline Dakin M.D.   On: 06/27/2017 13:04   Result Date: 06/27/2017 CLINICAL DATA:  52 year old presenting with a palpable lump in the upper right breast, shown on diagnostic imaging have a suspicious  approximate 2.9 cm mass at the 12 o'clock location approximately 7 cm from. Patient also has a solitary right axillary lymph node demonstrating focal cortical thickening to 6 mm. EXAM: ULTRASOUND GUIDED CORE NEEDLE BIOPSY OF A RIGHT AXILLARY NODE COMPARISON:  Previous exam(s). FINDINGS: I met with the patient and we discussed the procedure of ultrasound-guided biopsy, including benefits and alternatives. We discussed the high likelihood of a successful procedure. We discussed the risks of the procedure, including infection, bleeding, tissue injury, clip migration, and inadequate sampling. Informed written consent was given. The usual time-out protocol was performed immediately prior to the procedure. Using sterile technique with chlorhexidine as skin antisepsis 1% Lidocaine as  local anesthetic, under direct ultrasound visualization, a 14 gauge Bard Marquee core needle device placed through a 13 gauge introducer needle was used to perform biopsy of the deep right axillary lymph node demonstrating focal cortical thickening using a lateral approach. At the conclusion of the procedure a HydroMark spiral shaped tissue marker clip was deployed into the biopsy cavity. Follow up 2 view mammogram was performed and dictated separately. IMPRESSION: Ultrasound guided biopsy of a deep right axillary lymph node demonstrating focal cortical thickening. No apparent complications. Electronically Signed: By: Evangeline Dakin M.D. On: 06/26/2017 14:51   Mm Clip Placement Right  Result Date: 06/26/2017 CLINICAL DATA:  Confirmation of clip placement after ultrasound-guided core needle biopsy of a suspicious mass involving the upper inner quadrant of the right breast at middle depth and biopsy of a solitary right axillary lymph node with cortical thickening up to 6 mm. EXAM: DIAGNOSTIC RIGHT MAMMOGRAM POST ULTRASOUND BIOPSY COMPARISON:  Previous exam(s). FINDINGS: Mammographic images were obtained following ultrasound guided biopsy of a mass involving the upper inner quadrant of the right breast and biopsy of a right axillary lymph node with focal cortical thickening. The ribbon shaped tissue marker clip is appropriately positioned within the anterior portion of biopsied mass in the upper inner quadrant. The [facility] spiral shaped tissue marker clip is appropriately positioned within the right axillary lymph node with focal cortical thickening. Expected post biopsy changes are present at both locations without evidence of hematoma. IMPRESSION: 1. Appropriate positioning of the ribbon shaped tissue marker clip within the biopsied mass in the upper inner quadrant of the right breast at middle depth. 2. Appropriate positioning of the HydroMark spiral shaped tissue marker clip within the biopsied right  axillary lymph node demonstrating focal cortical thickening. Final Assessment: Post Procedure Mammograms for Marker Placement Electronically Signed   By: Evangeline Dakin M.D.   On: 06/26/2017 14:52   [place] Rt Breast Bx W Loc Dev 1st Lesion Img Bx Spec US Guide  Addendum Date: 06/27/2017   ADDENDUM REPORT: 06/27/2017 13:05 ADDENDUM: Pathology revealed GRADE III INVASIVE DUCTAL CARCINOMA of the Right breast, upper inner quadrant, 12:30 o'clock, 7 cm from nipple. NO CARCINOMA IDENTIFIED IN the Right axillary lymph node (level 2 node). This was found to be concordant by Dr. Peggye Fothergill. Pathology results were discussed with the patient by telephone. The patient reported doing well after the biopsies with tenderness at the sites. Post biopsy instructions and care were reviewed and questions were answered. The patient was encouraged to call [facility] for any additional concerns. The patient was referred to [facility] at [facility] on July 02, 2017. Pathology results reported by Terie Purser, RN on 06/27/2017. Electronically Signed   By: Evangeline Dakin M.D.   On: 06/27/2017 13:05  Result Date: 06/27/2017 CLINICAL DATA:  52 year old presenting with a palpable lump in the upper inner quadrant of the right breast, shown on diagnostic imaging have a suspicious approximate 2.9 cm mass at the 12:30 o'clock location approximately 7 cm from the nipple (at middle depth). Patient also has a solitary right axillary lymph node demonstrating focal cortical thickening to 6 mm. EXAM: ULTRASOUND GUIDED RIGHT BREAST CORE NEEDLE BIOPSY COMPARISON:  Previous exam(s). FINDINGS: I met with the patient and we discussed the procedure of ultrasound-guided biopsy, including benefits and alternatives. We discussed the high likelihood of a successful procedure. We discussed the risks of the procedure, including infection, bleeding, tissue injury,  clip migration, and inadequate sampling. Informed written consent was given. The usual time-out protocol was performed immediately prior to the procedure. Lesion quadrant: Upper inner quadrant. Using sterile technique with chlorhexidine as skin antisepsis, 1% lidocaine and 1% lidocaine with epinephrine as local anesthetic, under direct ultrasound visualization, a 12 gauge Bard Marquee core needle device was used to perform biopsy of the mass involving the upper inner quadrant of the right breast using a lateral approach. At the conclusion of the procedure a ribbon shaped tissue marker clip was deployed into the biopsy cavity. Follow up 2 view mammogram was performed and dictated separately. IMPRESSION: Ultrasound guided biopsy of a mass involving the upper inner quadrant of the right breast at the 12:30 o'clock position. No apparent complications. Electronically Signed: By: Evangeline Dakin M.D. On: 06/26/2017 14:49      IMPRESSION/PLAN: Right Breast Cancer  She has been discussed at our multidisciplinary tumor board.  The consensus is that she would be a good candidate for breast conservation. I talked to her about the option of a mastectomy and informed her that her expected overall survival would be equivalent between mastectomy and breast conservation, based upon randomized controlled data. She is enthusiastic about breast conservation.  It was a pleasure meeting the patient today. We discussed the risks, benefits, and side effects of radiotherapy. I recommend radiotherapy to the right breast to reduce her risk of locoregional recurrence by 2/3.  We discussed that radiation would take approximately 4 weeks to complete and that I would give the patient a few weeks to heal following surgery before starting treatment planning. If chemotherapy were to be given, this would precede radiotherapy. We spoke about acute effects including skin irritation and fatigue as well as much less common late effects including  internal organ injury or irritation. We spoke about the latest technology that is used to minimize the risk of late effects for patients undergoing radiotherapy to the breast or chest wall. No guarantees of treatment were given. The patient is enthusiastic about proceeding with treatment. I look forward to participating in the patient's care.  I will await her referral back to me for postoperative follow-up and eventual CT simulation/treatment planning.      __________________________________________   [name], MD  This document serves as a record of services personally performed by [name], MD. It was created on her behalf by Rae Lips, a trained medical scribe. The creation of this record is based on the scribe's personal observations and the provider's statements to them. This document has been checked and approved by the attending provider.

## 2017-07-02 NOTE — Progress Notes (Signed)
Nutrition Assessment  Reason for Assessment:  Pt seen in Breast Clinic  ASSESSMENT:   52 year old female with new diagnosis of breast cancer. Past medical history reviewed.   Patient reports normal intake.    Medications:  reviewed  Labs: reviewed  Anthropometrics:   Height: 67 inches Weight: 344 lb BMI: 53   NUTRITION DIAGNOSIS: Food and nutrition related knowledge deficit related to new diagnosis of breast cancer as evidenced by no prior need for nutrition related information.  INTERVENTION:   Discussed and provided packet of information regarding nutritional tips for breast cancer patients.  Questions answered.  Teachback method used.  Contact information provided and patient knows to contact me with questions/concerns.    MONITORING, EVALUATION, and GOAL: Pt will consume a healthy plant based diet to maintain lean body mass throughout treatment.   Benuel Ly B. Zenia Resides, [place], [place] Registered Dietitian [phone] (pager)

## 2017-07-02 NOTE — Progress Notes (Signed)
[place] Psychosocial Distress Screening Spiritual Care  Met with Caitlin Cohen and [name] in [facility] to introduce [facility] team/resources, reviewing distress screen per protocol.  The patient scored a 7 on the Psychosocial Distress Thermometer which indicates severe distress. Also assessed for distress and other psychosocial needs.   ONCBCN DISTRESS SCREENING 07/02/2017  Screening Type Initial Screening  Distress experienced in past week (1-10) 7  Practical problem type Housing  Family Problem type Other (comment)  Emotional problem type Depression;Nervousness/Anxiety;Adjusting to illness;Adjusting to appearance changes  Information Concerns Type Lack of info about diagnosis;Lack of info about treatment  Physical Problem type Sleep/insomnia;Swollen arms/legs;Skin dry/itchy  Referral to support programs Yes   Per Caitlin Cohen, [facility] was very helpful in resolving info concerns, building confidence in her team, and overall reducing her distress (now at a 4). Per pt, she has dealt with some measure of depression at times in the past and is not currently on antidepressants or working with a Social worker. She also reports that sleep is intermittently difficult at baseline but has been harder since dx.  Normalized feelings and using layers of support, including talking with her providers about mood, emotional needs, and sleep concerns. Provided emotional support, built rapport, and introduced Nature conservation officer. We particularly discussed her concerns about sharing dx/tx update with some family members, whom she expects to react with high, dramatic distress. Brainstormed communication strategies and offered Spiritual Care as a resource for support for her and for family members.  Couple was lively, frank, and welcoming of support. They plan to reach out as needed/desired.   Follow up needed: No. Per pt, no needs or concerns at this time, but please page if needs arise or circumstances change.  Thank you.   [place], [place], [facility] Pager [phone] Voicemail [phone]

## 2017-07-02 NOTE — Therapy (Signed)
[place], [place], [address] Phone: [phone]   Fax:  [phone]  Physical Therapy Evaluation  Patient Details  Name: Caitlin Cohen MRN: [id number] Date of Birth: 05-Dec-1965 Referring Provider: Dr. [name]   Encounter Date: 07/02/2017  PT End of Session - 07/02/17 1438    Visit Number  1    Number of Visits  1    PT Start Time  5885    PT Stop Time  0277 Also saw pt from 1408-1430 for a total of 25 minutes    PT Time Calculation (min)  3 min    Activity Tolerance  Patient tolerated treatment well    Behavior During Therapy  [facility] for tasks assessed/performed       Past Medical History:  Diagnosis Date  . Anxiety   . Depression     Past Surgical History:  Procedure Laterality Date  . CHOLECYSTECTOMY    . OPEN REDUCTION INTERNAL FIXATION (ORIF) DISTAL RADIAL FRACTURE  03/17/2012   Procedure: OPEN REDUCTION INTERNAL FIXATION (ORIF) DISTAL RADIAL FRACTURE;  Surgeon: Roseanne Kaufman, MD;  Location: [place];  Service: Orthopedics;  Laterality: Right;  Pre-operative a supra-clavical block right arm in addition to general anesthesia  . TUBAL LIGATION      There were no vitals filed for this visit.   Subjective Assessment - 07/02/17 1430    Subjective  Patient reports she is here today to be seen by her medical team for her newly diagnosed right breast cancer.    Pertinent History  Patient was diagnosed on 06/23/17 with right grade 3 invasive ductal carcinoma breast cancer. It measures 2.9 cm and is located in the upper outer quadrant. It is ER/PR positive and HER2 negative with a Ki 67 of 60%.     Patient Stated Goals  Reduce lymphedema risk and learn post op shoulder ROM HEP    Currently in Pain?  No/denies         [facility] PT Assessment - 07/02/17 0001      Assessment   Medical Diagnosis  Right breast cancer    Referring Provider  Dr. [name]    Onset Date/Surgical Date   06/23/17    Hand Dominance  Right    Prior Therapy  none      Precautions   Precautions  Other (comment)    Precaution Comments  active cancer      Restrictions   Weight Bearing Restrictions  No      Balance Screen   Has the patient fallen in the past 6 months  No    Has the patient had a decrease in activity level because of a fear of falling?   No    Is the patient reluctant to leave their home because of a fear of falling?   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    Leisure  She does not exercise      Cognition   Overall Cognitive Status  Within Functional Limits for tasks assessed      Posture/Postural Control   Posture/Postural Control  Postural limitations    Postural Limitations  Rounded Shoulders;Forward head  ROM / Strength   AROM / PROM / Strength  AROM;Strength      AROM   AROM Assessment Site  Shoulder;Cervical    Right/Left Shoulder  Right;Left    Right Shoulder Extension  47 Degrees    Right Shoulder Flexion  152 Degrees    Right Shoulder ABduction  149 Degrees    Right Shoulder Internal Rotation  63 Degrees    Right Shoulder External Rotation  74 Degrees    Left Shoulder Extension  40 Degrees    Left Shoulder Flexion  158 Degrees    Left Shoulder ABduction  151 Degrees    Left Shoulder Internal Rotation  54 Degrees    Left Shoulder External Rotation  81 Degrees    Cervical Flexion  WNL    Cervical Extension  WNL    Cervical - Right Side Bend  WNL    Cervical - Left Side Bend  WNL    Cervical - Right Rotation  WNL    Cervical - Left Rotation  WNL      Strength   Overall Strength  Within functional limits for tasks performed        LYMPHEDEMA/ONCOLOGY QUESTIONNAIRE - 07/02/17 1436       Type   Cancer Type  Right breast      Lymphedema Assessments   Lymphedema Assessments  Upper extremities      Right Upper Extremity Lymphedema   10 cm Proximal to Olecranon Process  45.2 cm    Olecranon Process  31.2 cm    10 cm Proximal to Ulnar Styloid Process  27.2 cm    Just Proximal to Ulnar Styloid Process  19.5 cm    Across Hand at [employer]  21.4 cm    At [place] of 2nd Digit  7.7 cm      Left Upper Extremity Lymphedema   10 cm Proximal to Olecranon Process  45.5 cm    Olecranon Process  32 cm    10 cm Proximal to Ulnar Styloid Process  27 cm    Just Proximal to Ulnar Styloid Process  19.6 cm    Across Hand at [employer]  21.2 cm    At [place] of 2nd Digit  7.5 cm          Objective measurements completed on examination: See above findings.     Patient was instructed today in a home exercise program today for post op shoulder range of motion. These included active assist shoulder flexion in sitting, scapular retraction, wall walking with shoulder abduction, and hands behind head external rotation.  She was encouraged to do these twice a day, holding 3 seconds and repeating 5 times when permitted by her physician.     PT Education - 07/02/17 1438    Education provided  Yes    Education Details  Lymphedema risk reduction and post op shoulder ROM HEP    Person(s) Educated  Patient;Other (comment) Boyfriend    Methods  Explanation;Demonstration;Handout    Comprehension  Verbalized understanding;Returned demonstration           Breast Clinic Goals - 07/02/17 1441      Patient will be able to verbalize understanding of pertinent lymphedema risk reduction practices relevant to her diagnosis specifically related to skin care.   Time  1    Period  Days    Status  Achieved      Patient will be able to return demonstrate and/or verbalize understanding of the post-op  home exercise program related to regaining shoulder range of motion.   Time  1    Period   Days    Status  Achieved      Patient will be able to verbalize understanding of the importance of attending the postoperative After Breast Cancer Class for further lymphedema risk reduction education and therapeutic exercise.   Time  1    Period  Days    Status  Achieved            Plan - 07/02/17 1439    Clinical Impression Statement  Patient was diagnosed on 06/23/17 with right grade 3 invasive ductal carcinoma breast cancer. It measures 2.9 cm and is located in the upper outer quadrant. It is ER/PR positive and HER2 negative with a Ki 67 of 60%. She is planning to have Oncotype testing on the core biopsy. Depending on those results, she will undergo neoadjuvant chemotherapy or neoadjuvant hormones followed by a right lumpectomy and sentinel node biopsy, radiation, and anti-estrogen therapy. She will benefit from a post op PT visit to reassess and determine needs.    History and Personal Factors relevant to plan of care:  None    Clinical Presentation  Stable    Clinical Decision Making  Low    Rehab Potential  Excellent    Clinical Impairments Affecting Rehab Potential  None    PT Frequency  One time visit    PT Treatment/Interventions  Therapeutic exercise;Patient/family education;ADLs/Self Care Home Management    PT Next Visit Plan  Will reassess post operatively    PT Home Exercise Plan  Post op shoulder ROM HEP    Consulted and Agree with Plan of Care  Patient       Patient will benefit from skilled therapeutic intervention in order to improve the following deficits and impairments:  Decreased knowledge of precautions, Impaired UE functional use, Decreased range of motion, Postural dysfunction, Pain  Visit Diagnosis: Malignant neoplasm of upper-outer quadrant of right breast in female, estrogen receptor positive ([place]) - Plan: PT plan of care cert/re-cert  Abnormal posture - Plan: PT plan of care cert/re-cert     Problem List Patient Active Problem List   Diagnosis  Date Noted  . Malignant neoplasm of upper-inner quadrant of right breast in female, estrogen receptor positive ([place]) 07/01/2017  . Mood disorder ([place]) 07/05/2013    Class: Acute    Annia Friendly, PT 07/02/17 2:46 PM  [place], [place], [address] Phone: [phone]   Fax:  [phone]  Name: Caitlin Cohen MRN: [id number] Date of Birth: May 17, 1965

## 2017-07-03 ENCOUNTER — Other Ambulatory Visit: Payer: Self-pay | Admitting: General Surgery

## 2017-07-03 DIAGNOSIS — Z170 Estrogen receptor positive status [ER+]: Principal | ICD-10-CM

## 2017-07-03 DIAGNOSIS — C50411 Malignant neoplasm of upper-outer quadrant of right female breast: Secondary | ICD-10-CM

## 2017-07-04 ENCOUNTER — Telehealth: Payer: Self-pay | Admitting: *Deleted

## 2017-07-04 NOTE — Pre-Procedure Instructions (Signed)
Caitlin Cohen  07/04/2017      [id number] - [place], [place] [address] [place] [address] Phone: [phone] Fax: [phone]    Your procedure is scheduled on Tuesday, March 26th   Report to [facility] Admitting at 5:30AM   Call this number if you have problems the morning of surgery:  [phone]   Remember:              As of today, STOP taking any anti-inflammtories, vitamins, herbal supplements.   Do not eat food or drink liquids after midnight, tonight.   Take these medicines the morning of surgery with A SIP OF WATER : None   Do not wear jewelry, make-up or nail polish.  Do not wear lotions, powders,  perfumes, or deodorant.  Do not shave 48 hours prior to surgery.   Do not bring valuables to the hospital.  [facility] is not responsible for any belongings or valuables.  Contacts, dentures or bridgework may not be worn into surgery.  Leave your suitcase in the car.  After surgery it may be brought to your room.  For patients admitted to the hospital, discharge time will be determined by your treatment team.  Patients discharged the day of surgery will not be allowed to drive home.  You will also need someone to stay with you for the first 24 hrs.  Please read over the following fact sheets that you were given. Pain Booklet and Surgical Site Infection Prevention       [facility]- Preparing For Surgery  Before surgery, you can play an important role. Because skin is not sterile, your skin needs to be as free of germs as possible. You can reduce the number of germs on your skin by washing with CHG (chlorahexidine gluconate) Soap before surgery.  CHG is an antiseptic cleaner which kills germs and bonds with the skin to continue killing germs even after washing.  Please do not use if you have an allergy to CHG or antibacterial soaps. If your skin becomes  reddened/irritated stop using the CHG.  Do not shave (including legs and underarms) for at least 48 hours prior to first CHG shower. It is OK to shave your face.  Please follow these instructions carefully.   1. Shower the NIGHT BEFORE SURGERY and the MORNING OF SURGERY with CHG.   2. If you chose to wash your hair, wash your hair first as usual with your normal shampoo.  3. After you shampoo, rinse your hair and body thoroughly to remove the shampoo.  4. Use CHG as you would any other liquid soap. You can apply CHG directly to the skin and wash gently with a scrungie or a clean washcloth.   5. Apply the CHG Soap to your body ONLY FROM THE NECK DOWN.  Do not use on open wounds or open sores. Avoid contact with your eyes, ears, mouth and genitals (private parts). Wash Face and genitals (private parts)  with your normal soap.  6. Wash thoroughly, paying special attention to the area where your surgery will be performed.  7. Thoroughly rinse your body with warm water from the neck down.  8. DO NOT shower/wash with your normal soap after using and rinsing off the CHG Soap.  9. Pat yourself dry with a CLEAN TOWEL.  10. Wear CLEAN PAJAMAS to bed the night before surgery, wear comfortable clothes the morning  of surgery  11. Place CLEAN SHEETS on your bed the night of your first shower and DO NOT SLEEP WITH PETS.    Day of Surgery: Do not apply any deodorants/lotions. Please wear clean clothes to the hospital/surgery center.

## 2017-07-04 NOTE — Telephone Encounter (Signed)
Spoke to pt regarding [facility] from 3.20.19. Denies questions or concerns regarding dx or treatment care plan. Discussed next steps after surgery. Encourage pt to call with needs. Received verbal understanding.

## 2017-07-07 ENCOUNTER — Other Ambulatory Visit: Payer: Self-pay

## 2017-07-07 ENCOUNTER — Other Ambulatory Visit: Payer: Self-pay | Admitting: General Surgery

## 2017-07-07 ENCOUNTER — Encounter (HOSPITAL_COMMUNITY): Payer: Self-pay

## 2017-07-07 ENCOUNTER — Ambulatory Visit
Admission: RE | Admit: 2017-07-07 | Discharge: 2017-07-07 | Disposition: A | Discharge status: Home / Self Care | Payer: No Typology Code available for payment source | Source: Ambulatory Visit | Attending: General Surgery | Admitting: General Surgery

## 2017-07-07 ENCOUNTER — Encounter (HOSPITAL_COMMUNITY)
Admission: RE | Admit: 2017-07-07 | Discharge: 2017-07-07 | Disposition: A | Discharge status: Home / Self Care | Payer: BLUE CROSS/BLUE SHIELD | Source: Ambulatory Visit | Attending: General Surgery | Admitting: General Surgery

## 2017-07-07 DIAGNOSIS — Z170 Estrogen receptor positive status [ER+]: Principal | ICD-10-CM

## 2017-07-07 DIAGNOSIS — C50411 Malignant neoplasm of upper-outer quadrant of right female breast: Secondary | ICD-10-CM

## 2017-07-07 DIAGNOSIS — Z01818 Encounter for other preprocedural examination: Secondary | ICD-10-CM | POA: Diagnosis present

## 2017-07-07 HISTORY — DX: Gastro-esophageal reflux disease without esophagitis: K21.9

## 2017-07-07 HISTORY — DX: Headache: R51

## 2017-07-07 HISTORY — DX: Headache, unspecified: R51.9

## 2017-07-07 MED ORDER — DEXTROSE 5 % IV SOLN
3.0000 g | INTRAVENOUS | Status: AC
  Administered 2017-07-08: 3 g via INTRAVENOUS
  Filled 2017-07-07: qty 3000
  Filled 2017-07-07: qty 3

## 2017-07-07 NOTE — Anesthesia Preprocedure Evaluation (Addendum)
Anesthesia Evaluation  Patient identified by MRN, date of birth, ID band Patient awake    Reviewed: Allergy & Precautions, NPO status , Patient's Chart, lab work & pertinent test results  Airway Mallampati: III  TM Distance: >3 FB Neck ROM: Full    Dental no notable dental hx.    Pulmonary neg pulmonary ROS,    Pulmonary exam normal breath sounds clear to auscultation       Cardiovascular negative cardio ROS Normal cardiovascular exam Rhythm:Regular Rate:Normal     Neuro/Psych  Headaches,    GI/Hepatic negative GI ROS, Neg liver ROS, GERD  ,  Endo/Other  Morbid obesity  Renal/GU negative Renal ROS     Musculoskeletal negative musculoskeletal ROS (+)   Abdominal (+) + obese,   Peds  Hematology   Anesthesia Other Findings   Reproductive/Obstetrics                            Lab Results  Component Value Date   WBC 9.1 07/02/2017   HGB 12.4 06/22/2012   HCT 40.0 07/02/2017   MCV 83.7 07/02/2017   PLT 293 07/02/2017   Lab Results  Component Value Date   CREATININE 1.13 (H) 07/02/2017   BUN 14 07/02/2017   NA 141 07/02/2017   K 4.1 07/02/2017   CL 105 07/02/2017   CO2 28 07/02/2017    Anesthesia Physical Anesthesia Plan  ASA: IV  Anesthesia Plan: General and Regional   Post-op Pain Management: GA combined w/ Regional for post-op pain   Induction: Intravenous  PONV Risk Score and Plan: Treatment may vary due to age or medical condition and Ondansetron  Airway Management Planned: Oral ETT  Additional Equipment:   Intra-op Plan:   Post-operative Plan: Extubation in OR  Informed Consent: I have reviewed the patients History and Physical, chart, labs and discussed the procedure including the risks, benefits and alternatives for the proposed anesthesia with the patient or authorized representative who has indicated his/her understanding and acceptance.   Dental advisory  given  Plan Discussed with: CRNA  Anesthesia Plan Comments:         Anesthesia Quick Evaluation

## 2017-07-07 NOTE — Progress Notes (Addendum)
Denies murmur, cp, htn, dm. Never had any cardiac tests done, takes no meds. PCP is Dr. Criss Rosales,  [place] 2017 Given ERAS drink with instructions to drink 3 hrs prior to surgery.   Plus I left a message for Dr. Donne Hazel, that patient has no one to stay with her for the first 24 hrs.

## 2017-07-08 ENCOUNTER — Observation Stay (HOSPITAL_COMMUNITY)
Admission: RE | Admit: 2017-07-08 | Discharge: 2017-07-09 | Disposition: A | Discharge status: Home / Self Care | Payer: BLUE CROSS/BLUE SHIELD | Source: Ambulatory Visit | Attending: General Surgery | Admitting: General Surgery

## 2017-07-08 ENCOUNTER — Encounter (HOSPITAL_COMMUNITY): Payer: Self-pay | Admitting: *Deleted

## 2017-07-08 ENCOUNTER — Ambulatory Visit (HOSPITAL_COMMUNITY)
Admission: RE | Admit: 2017-07-08 | Discharge: 2017-07-08 | Disposition: A | Discharge status: Home / Self Care | Payer: BLUE CROSS/BLUE SHIELD | Source: Ambulatory Visit | Attending: General Surgery | Admitting: General Surgery

## 2017-07-08 ENCOUNTER — Ambulatory Visit (HOSPITAL_COMMUNITY): Payer: BLUE CROSS/BLUE SHIELD | Admitting: Certified Registered Nurse Anesthetist

## 2017-07-08 ENCOUNTER — Encounter (HOSPITAL_COMMUNITY)
Admission: RE | Disposition: A | Discharge status: Home / Self Care | Payer: Self-pay | Source: Ambulatory Visit | Attending: General Surgery

## 2017-07-08 ENCOUNTER — Ambulatory Visit
Admission: RE | Admit: 2017-07-08 | Discharge: 2017-07-08 | Disposition: A | Discharge status: Home / Self Care | Payer: No Typology Code available for payment source | Source: Ambulatory Visit | Attending: General Surgery | Admitting: General Surgery

## 2017-07-08 DIAGNOSIS — C50411 Malignant neoplasm of upper-outer quadrant of right female breast: Principal | ICD-10-CM | POA: Insufficient documentation

## 2017-07-08 DIAGNOSIS — F419 Anxiety disorder, unspecified: Secondary | ICD-10-CM | POA: Insufficient documentation

## 2017-07-08 DIAGNOSIS — Z170 Estrogen receptor positive status [ER+]: Principal | ICD-10-CM

## 2017-07-08 DIAGNOSIS — Z79899 Other long term (current) drug therapy: Secondary | ICD-10-CM | POA: Insufficient documentation

## 2017-07-08 DIAGNOSIS — Z6841 Body Mass Index (BMI) 40.0 and over, adult: Secondary | ICD-10-CM | POA: Diagnosis not present

## 2017-07-08 DIAGNOSIS — K219 Gastro-esophageal reflux disease without esophagitis: Secondary | ICD-10-CM | POA: Diagnosis not present

## 2017-07-08 HISTORY — PX: MASTECTOMY WITH RADIOACTIVE SEED GUIDED EXCISION AND AXILLARY SENTINEL LYMPH NODE BIOPSY: SHX6736

## 2017-07-08 LAB — POCT PREGNANCY, URINE: Preg Test, Ur: NEGATIVE

## 2017-07-08 SURGERY — MASTECTOMY WITH RADIOACTIVE SEED GUIDED EXCISION AND AXILLARY SENTINEL LYMPH NODE BIOPSY
Anesthesia: Regional | Site: Breast | Laterality: Right

## 2017-07-08 MED ORDER — MEPERIDINE HCL 50 MG/ML IJ SOLN: 6.2500 mg | INTRAMUSCULAR | Status: DC | PRN

## 2017-07-08 MED ORDER — SUCCINYLCHOLINE CHLORIDE 200 MG/10ML IV SOSY
PREFILLED_SYRINGE | INTRAVENOUS | Status: AC
  Filled 2017-07-08: qty 10

## 2017-07-08 MED ORDER — ONDANSETRON 4 MG PO TBDP: 4.0000 mg | ORAL_TABLET | Freq: Four times a day (QID) | ORAL | Status: DC | PRN

## 2017-07-08 MED ORDER — ACETAMINOPHEN 500 MG PO TABS
1000.0000 mg | ORAL_TABLET | ORAL | Status: AC
  Administered 2017-07-08: 1000 mg via ORAL
  Filled 2017-07-08: qty 2

## 2017-07-08 MED ORDER — BUPIVACAINE-EPINEPHRINE (PF) 0.25% -1:200000 IJ SOLN
INTRAMUSCULAR | Status: AC
  Filled 2017-07-08: qty 30

## 2017-07-08 MED ORDER — ENOXAPARIN SODIUM 40 MG/0.4ML ~~LOC~~ SOLN
40.0000 mg | SUBCUTANEOUS | Status: DC
  Administered 2017-07-09: 40 mg via SUBCUTANEOUS
  Filled 2017-07-08: qty 0.4

## 2017-07-08 MED ORDER — ROPIVACAINE HCL 5 MG/ML IJ SOLN
INTRAMUSCULAR | Status: DC | PRN
  Administered 2017-07-08: 30 mL

## 2017-07-08 MED ORDER — SODIUM CHLORIDE 0.9 % IJ SOLN
INTRAMUSCULAR | Status: AC
Start: 2017-07-08 — End: 2017-07-08
  Filled 2017-07-08: qty 10

## 2017-07-08 MED ORDER — MORPHINE SULFATE (PF) 4 MG/ML IV SOLN: 1.0000 mg | INTRAVENOUS | Status: DC | PRN

## 2017-07-08 MED ORDER — NAPROXEN 250 MG PO TABS: 500.0000 mg | ORAL_TABLET | Freq: Two times a day (BID) | ORAL | Status: DC | PRN

## 2017-07-08 MED ORDER — HYDROMORPHONE HCL 1 MG/ML IJ SOLN
INTRAMUSCULAR | Status: AC
Start: 2017-07-08 — End: 2017-07-08
  Filled 2017-07-08: qty 1

## 2017-07-08 MED ORDER — SODIUM CHLORIDE 0.9 % IV SOLN
INTRAVENOUS | Status: DC
  Administered 2017-07-08: 13:00:00 via INTRAVENOUS

## 2017-07-08 MED ORDER — FENTANYL CITRATE (PF) 100 MCG/2ML IJ SOLN
INTRAMUSCULAR | Status: DC | PRN
  Administered 2017-07-08: 100 ug via INTRAVENOUS
  Administered 2017-07-08: 25 ug via INTRAVENOUS
  Administered 2017-07-08: 50 ug via INTRAVENOUS
  Administered 2017-07-08: 25 ug via INTRAVENOUS
  Administered 2017-07-08: 50 ug via INTRAVENOUS

## 2017-07-08 MED ORDER — DEXAMETHASONE SODIUM PHOSPHATE 10 MG/ML IJ SOLN
INTRAMUSCULAR | Status: DC | PRN
  Administered 2017-07-08: 10 mg via INTRAVENOUS

## 2017-07-08 MED ORDER — GLYCOPYRROLATE 0.2 MG/ML IV SOSY
PREFILLED_SYRINGE | INTRAVENOUS | Status: DC | PRN
  Administered 2017-07-08: .2 mg via INTRAVENOUS

## 2017-07-08 MED ORDER — SIMETHICONE 80 MG PO CHEW: 40.0000 mg | CHEWABLE_TABLET | Freq: Four times a day (QID) | ORAL | Status: DC | PRN

## 2017-07-08 MED ORDER — LIDOCAINE 2% (20 MG/ML) 5 ML SYRINGE
INTRAMUSCULAR | Status: DC | PRN
  Administered 2017-07-08: 100 mg via INTRAVENOUS

## 2017-07-08 MED ORDER — 0.9 % SODIUM CHLORIDE (POUR BTL) OPTIME
TOPICAL | Status: DC | PRN
  Administered 2017-07-08: 1000 mL

## 2017-07-08 MED ORDER — PROMETHAZINE HCL 25 MG/ML IJ SOLN: 6.2500 mg | INTRAMUSCULAR | Status: DC | PRN

## 2017-07-08 MED ORDER — PHENYLEPHRINE 40 MCG/ML (10ML) SYRINGE FOR IV PUSH (FOR BLOOD PRESSURE SUPPORT)
PREFILLED_SYRINGE | INTRAVENOUS | Status: DC | PRN
  Administered 2017-07-08: 120 ug via INTRAVENOUS
  Administered 2017-07-08 (×2): 80 ug via INTRAVENOUS

## 2017-07-08 MED ORDER — METHYLENE BLUE 0.5 % INJ SOLN
INTRAVENOUS | Status: AC
Start: 2017-07-08 — End: 2017-07-08
  Filled 2017-07-08: qty 10

## 2017-07-08 MED ORDER — HYDROMORPHONE HCL 1 MG/ML IJ SOLN
0.2500 mg | INTRAMUSCULAR | Status: DC | PRN
  Administered 2017-07-08: 0.25 mg via INTRAVENOUS

## 2017-07-08 MED ORDER — ONDANSETRON HCL 4 MG/2ML IJ SOLN
INTRAMUSCULAR | Status: DC | PRN
  Administered 2017-07-08: 4 mg via INTRAVENOUS

## 2017-07-08 MED ORDER — ACETAMINOPHEN 650 MG RE SUPP: 650.0000 mg | Freq: Four times a day (QID) | RECTAL | Status: DC | PRN

## 2017-07-08 MED ORDER — ACETAMINOPHEN 325 MG PO TABS: 650.0000 mg | ORAL_TABLET | Freq: Four times a day (QID) | ORAL | Status: DC | PRN

## 2017-07-08 MED ORDER — HYDROCODONE-ACETAMINOPHEN 7.5-325 MG PO TABS
1.0000 | ORAL_TABLET | Freq: Once | ORAL | Status: AC | PRN
  Administered 2017-07-08: 1 via ORAL

## 2017-07-08 MED ORDER — PROPOFOL 10 MG/ML IV BOLUS
INTRAVENOUS | Status: DC | PRN
  Administered 2017-07-08: 270 mg via INTRAVENOUS

## 2017-07-08 MED ORDER — FENTANYL CITRATE (PF) 250 MCG/5ML IJ SOLN
INTRAMUSCULAR | Status: AC
  Filled 2017-07-08: qty 5

## 2017-07-08 MED ORDER — PROPOFOL 10 MG/ML IV BOLUS
INTRAVENOUS | Status: AC
  Filled 2017-07-08: qty 40

## 2017-07-08 MED ORDER — MIDAZOLAM HCL 5 MG/5ML IJ SOLN
INTRAMUSCULAR | Status: DC | PRN
  Administered 2017-07-08: 2 mg via INTRAVENOUS

## 2017-07-08 MED ORDER — MIDAZOLAM HCL 2 MG/2ML IJ SOLN
INTRAMUSCULAR | Status: AC
  Filled 2017-07-08: qty 2

## 2017-07-08 MED ORDER — DEXAMETHASONE SODIUM PHOSPHATE 10 MG/ML IJ SOLN
INTRAMUSCULAR | Status: AC
  Filled 2017-07-08: qty 1

## 2017-07-08 MED ORDER — ONDANSETRON HCL 4 MG/2ML IJ SOLN
INTRAMUSCULAR | Status: AC
  Filled 2017-07-08: qty 2

## 2017-07-08 MED ORDER — TECHNETIUM TC 99M SULFUR COLLOID FILTERED
1.0000 | Freq: Once | INTRAVENOUS | Status: AC | PRN
  Administered 2017-07-08: 1 via INTRADERMAL

## 2017-07-08 MED ORDER — ENSURE PRE-SURGERY PO LIQD
296.0000 mL | Freq: Once | ORAL | Status: DC
  Filled 2017-07-08: qty 296

## 2017-07-08 MED ORDER — LACTATED RINGERS IV SOLN
INTRAVENOUS | Status: DC | PRN
  Administered 2017-07-08 (×2): via INTRAVENOUS

## 2017-07-08 MED ORDER — LIDOCAINE HCL (CARDIAC) 20 MG/ML IV SOLN
INTRAVENOUS | Status: AC
  Filled 2017-07-08: qty 5

## 2017-07-08 MED ORDER — GABAPENTIN 300 MG PO CAPS
300.0000 mg | ORAL_CAPSULE | ORAL | Status: AC
  Administered 2017-07-08: 300 mg via ORAL
  Filled 2017-07-08: qty 1

## 2017-07-08 MED ORDER — HEMOSTATIC AGENTS (NO CHARGE) OPTIME
TOPICAL | Status: DC | PRN
  Administered 2017-07-08: 1 via TOPICAL

## 2017-07-08 MED ORDER — ONDANSETRON HCL 4 MG/2ML IJ SOLN: 4.0000 mg | Freq: Four times a day (QID) | INTRAMUSCULAR | Status: DC | PRN

## 2017-07-08 MED ORDER — OXYCODONE HCL 5 MG PO TABS
5.0000 mg | ORAL_TABLET | Freq: Four times a day (QID) | ORAL | Status: DC | PRN
  Administered 2017-07-08 – 2017-07-09 (×3): 5 mg via ORAL
  Filled 2017-07-08 (×3): qty 1

## 2017-07-08 MED ORDER — METHOCARBAMOL 500 MG PO TABS
500.0000 mg | ORAL_TABLET | Freq: Four times a day (QID) | ORAL | Status: DC | PRN
Start: 2017-07-08 — End: 2017-07-09
  Administered 2017-07-08: 500 mg via ORAL
  Filled 2017-07-08: qty 1

## 2017-07-08 MED ORDER — ACETAMINOPHEN 10 MG/ML IV SOLN: 1000.0000 mg | Freq: Once | INTRAVENOUS | Status: DC | PRN

## 2017-07-08 MED ORDER — KETOROLAC TROMETHAMINE 15 MG/ML IJ SOLN
15.0000 mg | INTRAMUSCULAR | Status: DC
  Administered 2017-07-08: 15 mg via INTRAVENOUS
  Filled 2017-07-08: qty 1

## 2017-07-08 MED ORDER — SUCCINYLCHOLINE CHLORIDE 200 MG/10ML IV SOSY
PREFILLED_SYRINGE | INTRAVENOUS | Status: DC | PRN
  Administered 2017-07-08: 150 mg via INTRAVENOUS

## 2017-07-08 MED ORDER — BUPIVACAINE-EPINEPHRINE 0.25% -1:200000 IJ SOLN
INTRAMUSCULAR | Status: DC | PRN
  Administered 2017-07-08: 9 mL

## 2017-07-08 MED ORDER — HYDROCODONE-ACETAMINOPHEN 7.5-325 MG PO TABS
ORAL_TABLET | ORAL | Status: AC
  Filled 2017-07-08: qty 1

## 2017-07-08 SURGICAL SUPPLY — 55 items
ADH SKN CLS APL DERMABOND .7 (GAUZE/BANDAGES/DRESSINGS) ×1
APPLIER CLIP 9.375 MED OPEN (MISCELLANEOUS) ×3
APR CLP MED 9.3 20 MLT OPN (MISCELLANEOUS) ×1
BINDER BREAST 3XL (GAUZE/BANDAGES/DRESSINGS) ×2 IMPLANT
BINDER BREAST LRG (GAUZE/BANDAGES/DRESSINGS) IMPLANT
BINDER BREAST XLRG (GAUZE/BANDAGES/DRESSINGS) IMPLANT
BIOPATCH RED 1 DISK 7.0 (GAUZE/BANDAGES/DRESSINGS) ×1 IMPLANT
BIOPATCH RED 1IN DISK 7.0MM (GAUZE/BANDAGES/DRESSINGS)
CANISTER SUCT 3000ML PPV (MISCELLANEOUS) ×3 IMPLANT
CHLORAPREP W/TINT 26ML (MISCELLANEOUS) ×3 IMPLANT
CLIP APPLIE 9.375 MED OPEN (MISCELLANEOUS) ×1 IMPLANT
CLOSURE WOUND 1/2 X4 (GAUZE/BANDAGES/DRESSINGS) ×1
COVER SURGICAL LIGHT HANDLE (MISCELLANEOUS) ×3 IMPLANT
COVER TRANSDUCER ULTRASND GEL (DRAPE) ×3 IMPLANT
DERMABOND ADVANCED (GAUZE/BANDAGES/DRESSINGS) ×2
DERMABOND ADVANCED .7 DNX12 (GAUZE/BANDAGES/DRESSINGS) ×1 IMPLANT
DEVICE DUBIN SPECIMEN MAMMOGRA (MISCELLANEOUS) ×3 IMPLANT
DRAIN CHANNEL 19F RND (DRAIN) ×1 IMPLANT
DRAPE CHEST BREAST 15X10 FENES (DRAPES) ×3 IMPLANT
DRSG PAD ABDOMINAL 8X10 ST (GAUZE/BANDAGES/DRESSINGS) ×6 IMPLANT
ELECT BLADE 4.0 EZ CLEAN MEGAD (MISCELLANEOUS)
ELECT CAUTERY BLADE 6.4 (BLADE) ×1 IMPLANT
ELECT COATED BLADE 2.86 ST (ELECTRODE) ×2 IMPLANT
ELECT REM PT RETURN 9FT ADLT (ELECTROSURGICAL) ×3
ELECTRODE BLDE 4.0 EZ CLN MEGD (MISCELLANEOUS) ×1 IMPLANT
ELECTRODE REM PT RTRN 9FT ADLT (ELECTROSURGICAL) ×1 IMPLANT
EVACUATOR SILICONE 100CC (DRAIN) ×3 IMPLANT
GLOVE BIO SURGEON STRL SZ7 (GLOVE) ×6 IMPLANT
GLOVE BIOGEL PI IND STRL 7.5 (GLOVE) ×1 IMPLANT
GLOVE BIOGEL PI INDICATOR 7.5 (GLOVE) ×2
GOWN STRL REUS W/ TWL LRG LVL3 (GOWN DISPOSABLE) ×2 IMPLANT
GOWN STRL REUS W/TWL LRG LVL3 (GOWN DISPOSABLE) ×6
HEMOSTAT ARISTA ABSORB 3G PWDR (MISCELLANEOUS) ×2 IMPLANT
KIT BASIN OR (CUSTOM PROCEDURE TRAY) ×3 IMPLANT
KIT MARKER MARGIN INK (KITS) ×3 IMPLANT
KIT ROOM TURNOVER OR (KITS) ×3 IMPLANT
MARKER SKIN DUAL TIP RULER LAB (MISCELLANEOUS) ×1 IMPLANT
NDL FILTER BLUNT 18X1 1/2 (NEEDLE) IMPLANT
NDL HYPO 25GX1X1/2 BEV (NEEDLE) IMPLANT
NEEDLE FILTER BLUNT 18X 1/2SAF (NEEDLE)
NEEDLE FILTER BLUNT 18X1 1/2 (NEEDLE) IMPLANT
NEEDLE HYPO 25GX1X1/2 BEV (NEEDLE) ×3 IMPLANT
NS IRRIG 1000ML POUR BTL (IV SOLUTION) ×3 IMPLANT
PACK GENERAL/GYN (CUSTOM PROCEDURE TRAY) ×3 IMPLANT
PAD ARMBOARD 7.5X6 YLW CONV (MISCELLANEOUS) ×3 IMPLANT
STRIP CLOSURE SKIN 1/2X4 (GAUZE/BANDAGES/DRESSINGS) ×2 IMPLANT
SUT ETHILON 2 0 FS 18 (SUTURE) ×1 IMPLANT
SUT MNCRL AB 4-0 PS2 18 (SUTURE) ×3 IMPLANT
SUT SILK 2 0 SH (SUTURE) ×2 IMPLANT
SUT VIC AB 3-0 SH 18 (SUTURE) ×5 IMPLANT
SUT VIC AB 3-0 SH 27 (SUTURE) ×3
SUT VIC AB 3-0 SH 27X BRD (SUTURE) IMPLANT
SYR CONTROL 10ML LL (SYRINGE) IMPLANT
TOWEL OR 17X24 6PK STRL BLUE (TOWEL DISPOSABLE) ×3 IMPLANT
TOWEL OR 17X26 10 PK STRL BLUE (TOWEL DISPOSABLE) ×3 IMPLANT

## 2017-07-08 NOTE — H&P (View-Only) (Signed)
52 yof referred by Dr Isidore Moos for new right breast cancer. 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 she has a roommate and has children who live in area also. she noted a right breast mass about a month ago. she has no personal history of breast disease. she has no family history of breast or ovarian cancer. she has no nipple dc. she underwent evaluation with mm and [place]. there is a mass in uoq of right breast. on [place] this measures 2.9x2.8x.2.5 cm in size. there is a single node with a thickened cortex. biopsy of the node is benign and concordant. 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 she is here by herself today to discuss options  Past Surgical History Conni Slipper, RN; 07/02/2017 7:56 AM) Breast Biopsy  Right. Gallbladder Surgery - Laparoscopic  Oral Surgery   Diagnostic Studies History Conni Slipper, RN; 07/02/2017 7:56 AM) Colonoscopy  never Mammogram  within last year Pap Smear  1-5 years ago  Medication History Conni Slipper, RN; 07/02/2017 7:56 AM) Medications Reconciled  Social History Conni Slipper, RN; 07/02/2017 7:56 AM) Alcohol use  Occasional alcohol use. Caffeine use  Carbonated beverages, Coffee. No drug use  Tobacco use  Never smoker.  Family History Conni Slipper, RN; 07/02/2017 7:56 AM) Alcohol Abuse  Family Members In General. Arthritis  Family Members In General, Father, Mother. Depression  Mother, Sister. Diabetes Mellitus  Family Members In General, Father. Heart Disease  Family Members In General, Father, Mother. Heart disease in female family member before age 39  Hypertension  Family Members In General, Father, Mother. Kidney Disease  Father. Seizure disorder  Mother. Thyroid problems  Daughter.  Pregnancy / Birth History Conni Slipper, RN; 07/02/2017 7:56 AM) Age at menarche  52 years. Age of menopause  [age] Contraceptive History  Oral contraceptives. Gravida  3 Irregular  periods  Maternal age  23-20 Para  3  Other Problems Conni Slipper, RN; 07/02/2017 7:56 AM) Anxiety Disorder  Breast Cancer  Cholelithiasis  Lump In Breast  Migraine Headache   Review of Systems Conni Slipper RN; 07/02/2017 7:56 AM) General Not Present- Appetite Loss, Chills, Fatigue, Fever, Night Sweats, Weight Gain and Weight Loss. Skin Not Present- Change in Wart/Mole, Dryness, Hives, Jaundice, New Lesions, Non-Healing Wounds, Rash and Ulcer. HEENT Present- Seasonal Allergies. Not Present- Earache, Hearing Loss, Hoarseness, Nose Bleed, Oral Ulcers, Ringing in the Ears, Sinus Pain, Sore Throat, Visual Disturbances, Wears glasses/contact lenses and Yellow Eyes. Respiratory Present- Snoring. Not Present- Bloody sputum, Chronic Cough, Difficulty Breathing and Wheezing. Breast Present- Breast Mass. Not Present- Breast Pain, Nipple Discharge and Skin Changes. Cardiovascular Present- Leg Cramps and Swelling of Extremities. Not Present- Chest Pain, Difficulty Breathing Lying Down, Palpitations, Rapid Heart Rate and Shortness of Breath. Gastrointestinal Present- Indigestion. Not Present- Abdominal Pain, Bloating, Bloody Stool, Change in Bowel Habits, Chronic diarrhea, Constipation, Difficulty Swallowing, Excessive gas, Gets full quickly at meals, Hemorrhoids, Nausea, Rectal Pain and Vomiting. Female Genitourinary Not Present- Frequency, Nocturia, Painful Urination, Pelvic Pain and Urgency. Musculoskeletal Not Present- Back Pain, Joint Pain, Joint Stiffness, Muscle Pain, Muscle Weakness and Swelling of Extremities. Neurological Not Present- Decreased Memory, Fainting, Headaches, Numbness, Seizures, Tingling, Tremor, Trouble walking and Weakness. Psychiatric Not Present- Anxiety, Bipolar, Change in Sleep Pattern, Depression, Fearful and Frequent crying. Endocrine Not Present- Cold Intolerance, Excessive Hunger, Hair Changes, Heat Intolerance, Hot flashes and New Diabetes. Hematology Not Present-  Blood Thinners, Easy Bruising, Excessive  bleeding, Gland problems, HIV and Persistent Infections.   Physical Exam [name] MD; 07/02/2017 3:34 PM) General Mental Status-Alert. Head and Neck Trachea-midline. Thyroid Gland Characteristics - normal size and consistency. Eye Sclera/Conjunctiva - Bilateral-No scleral icterus. Chest and Lung Exam Chest and lung exam reveals -quiet, even and easy respiratory effort with no use of accessory muscles and on auscultation, normal breath sounds, no adventitious sounds and normal vocal resonance. Breast Nipples-No Discharge. Note: 3 cm mobile right breast mass about 4-5 cm from nac at 12 oclock Cardiovascular Cardiovascular examination reveals -normal heart sounds, regular rate and rhythm with no murmurs. Abdomen Note: soft nontender well healed lap chole scars Peripheral Vascular Note: no le edema Neuropsychiatric Mental status exam performed with findings of-Oriented X3 with appropriate mood and affect. Lymphatic Head & Neck General Head & Neck Lymphatics: Bilateral - Description - Normal. Axillary General Axillary Region: Bilateral - Description - Normal. Note: no [place] adenopathy   Assessment & Plan [name] MD; 07/02/2017 3:37 PM) BREAST CANCER OF UPPER-OUTER QUADRANT OF RIGHT FEMALE BREAST (C50.411) Story: Right breast seed guided lumpectomy, right axillary sn biopsy We discussed for about an hour staging and pathophysiology of breast cancer. We discussed all of the different options for treatment for breast cancer including surgery, chemotherapy, radiation therapy, and antiestrogen therapy. We discussed a sentinel lymph node biopsy as she does not appear to having lymph node involvement right now. her node biopsy is negative and [place] is otherwise ok. We discussed the performance of that with injection of radioactive tracer. We discussed that she would have an incision underneath her axillary hairline. We  discussed that there is a chance of having a positive node with a sentinel lymph node biopsy and we will await the permanent pathology to make any other first further decisions in terms of her treatment. One of these options might be to return to the operating room to perform an axillary lymph node dissection. We discussed up to a 5% risk lifetime of chronic shoulder pain as well as lymphedema associated with a sentinel lymph node biopsy. We discussed the options for treatment of the breast cancer which included lumpectomy versus a mastectomy. We discussed a 5-10% chance of a positive margin requiring reexcision in the operating room. We also discussed that she will need radiation therapy if she undergoes lumpectomy. We discussed the mastectomy and the postoperative care for that as well. Mastectomy can be followed by reconstruction. This is a more extensive surgery and requires more recovery. The decision for lumpectomy vs mastectomy has no impact on decision for chemotherapy. Most mastectomy patients will not need radiation. We discussed that there is no difference in her survival whether she undergoes lumpectomy with radiation therapy or antiestrogen therapy versus a mastectomy. There is also no real difference between her recurrence in the breast. discussed risks of surgery. she would like to proceed with bct-breast size large and can do.

## 2017-07-08 NOTE — Discharge Instructions (Signed)
[facility] °Office Phone Number [phone] °POST OP INSTRUCTIONS ° °Always review your discharge instruction sheet given to you by the facility where your surgery was performed. ° °IF YOU HAVE DISABILITY OR FAMILY LEAVE FORMS, YOU MUST BRING THEM TO THE OFFICE FOR PROCESSING.  DO NOT GIVE THEM TO YOUR DOCTOR. ° °1. A prescription for pain medication may be given to you upon discharge.  Take your pain medication as prescribed, if needed.  If narcotic pain medicine is not needed, then you may take acetaminophen (Tylenol), naprosyn (Alleve) or ibuprofen (Advil) as needed. °2. Take your usually prescribed medications unless otherwise directed °3. If you need a refill on your pain medication, please contact your pharmacy.  They will contact our office to request authorization.  Prescriptions will not be filled after 5pm or on week-ends. °4. You should eat very light the first 24 hours after surgery, such as soup, crackers, pudding, etc.  Resume your normal diet the day after surgery. °5. Most patients will experience some swelling and bruising in the breast.  Ice packs and a good support bra will help.  Wear the breast binder provided or a sports bra for 72 hours day and night.  After that wear a sports bra during the day until you return to the office. Swelling and bruising can take several days to resolve.  °6. It is common to experience some constipation if taking pain medication after surgery.  Increasing fluid intake and taking a stool softener will usually help or prevent this problem from occurring.  A mild laxative (Milk of Magnesia or Miralax) should be taken according to package directions if there are no bowel movements after 48 hours. °7. Unless discharge instructions indicate otherwise, you may remove your bandages 48 hours after surgery and you may shower at that time.  You may have steri-strips (small skin tapes) in place directly over the incision.  These strips should be left on the  skin for 7-10 days and will come off on their own.  If your surgeon used skin glue on the incision, you may shower in 24 hours.  The glue will flake off over the next 2-3 weeks.  Any sutures or staples will be removed at the office during your follow-up visit. °8. ACTIVITIES:  You may resume regular daily activities (gradually increasing) beginning the next day.  Wearing a good support bra or sports bra minimizes pain and swelling.  You may have sexual intercourse when it is comfortable. °a. You may drive when you no longer are taking prescription pain medication, you can comfortably wear a seatbelt, and you can safely maneuver your car and apply brakes. °b. RETURN TO WORK:  ______________________________________________________________________________________ °9. You should see your doctor in the office for a follow-up appointment approximately two weeks after your surgery.  Your doctor’s nurse will typically make your follow-up appointment when she calls you with your pathology report.  Expect your pathology report 3-4 business days after your surgery.  You may call to check if you do not hear from us after three days. °10. OTHER INSTRUCTIONS: _______________________________________________________________________________________________ _____________________________________________________________________________________________________________________________________ °_____________________________________________________________________________________________________________________________________ °_____________________________________________________________________________________________________________________________________ ° °WHEN TO CALL DR Pankaj Haack: °1. Fever over 101.0 °2. Nausea and/or vomiting. °3. Extreme swelling or bruising. °4. Continued bleeding from incision. °5. Increased pain, redness, or drainage from the incision. ° °The clinic staff is available to answer your questions during regular  business hours.  Please don’t hesitate to call and ask to speak to one of the nurses for clinical concerns.  If you   have a medical emergency, go to the nearest emergency room or call 911.  A surgeon from [facility] is always on call at the hospital. ° °For further questions, please visit [email or web address] ° °

## 2017-07-08 NOTE — Interval H&P Note (Signed)
History and Physical Interval Note:  07/08/2017 7:31 AM  Caitlin Cohen  has presented today for surgery, with the diagnosis of right breast cancer  The various methods of treatment have been discussed with the patient and family. After consideration of risks, benefits and other options for treatment, the patient has consented to  Procedure(s) with comments: [place] (Right) - PEC BLOCK as a surgical intervention .  The patient's history has been reviewed, patient examined, no change in status, stable for surgery.  I have reviewed the patient's chart and labs.  Questions were answered to the patient's satisfaction.     [name]

## 2017-07-08 NOTE — Progress Notes (Signed)
Received patient from PACU. Patient alert oriented x4. Dressing clean, dry and intact. Breast binder on. Patient resting comfortably in bed. Accompanied by friend. Will continue to monitor.

## 2017-07-08 NOTE — Transfer of Care (Signed)
Immediate Anesthesia Transfer of Care Note  Patient: Caitlin Cohen  Procedure(s) Performed: RIGHT LUMPTECTOMY WITH RADIOACTIVE SEED GUIDED EXCISION AND RIGHT AXILLARY SENTINEL LYMPH NODE BIOPSY ERAS PATHWAY (Right Breast)  Patient Location: PACU  Anesthesia Type:GA combined with regional for post-op pain  Level of Consciousness: awake, alert  and oriented  Airway & Oxygen Therapy: Patient Spontanous Breathing and Patient connected to face mask oxygen  Post-op Assessment: Report given to RN and Post -op Vital signs reviewed and stable  Post vital signs: Reviewed and stable  Last Vitals:  Vitals Value Taken Time  BP    Temp    Pulse 90 07/08/2017  9:18 AM  Resp    SpO2 99 % 07/08/2017  9:18 AM  Vitals shown include unvalidated device data.  Last Pain:  Vitals:   07/08/17 0624  TempSrc:   PainSc: 0-No pain      Patients Stated Pain Goal: 6 (88/41/66 0630)  Complications: No apparent anesthesia complications

## 2017-07-08 NOTE — Anesthesia Procedure Notes (Signed)
Anesthesia Regional Block: Pectoralis block   Pre-Anesthetic Checklist: ,, timeout performed, Correct Patient, Correct Site, Correct Laterality, Correct Procedure, Correct Position, site marked, Risks and benefits discussed,  Surgical consent,  Pre-op evaluation,  At surgeon's request and post-op pain management  Laterality: Right  Prep: chloraprep       Needles:  Injection technique: Single-shot  Needle Type: Echogenic Needle     Needle Length: 9cm  Needle Gauge: 21     Additional Needles:   Narrative:  Start time: 07/08/2017 7:05 AM End time: 07/08/2017 7:12 AM Injection made incrementally with aspirations every 5 mL.  Performed by: Personally  Anesthesiologist: Barnet Glasgow, MD

## 2017-07-08 NOTE — Op Note (Addendum)
Preoperative diagnosis: Right breast cancer, clinical stage II Postoperative diagnosis: same as above Procedure:Right breast seed guided lumpectomy Right deep axillary sentinel node biopsy Surgeon: Dr Serita Grammes UEK:[id number] Anes: general  Specimens  1.right breast tissue marked with paint 2. rightaxillary sentinel nodes with highest count 1346 4. Additional superior and inferior margins marked short superior, long lateral and double deep Complications none Drains none Sponge count correct Dispo to pacu stable  Indications: This is a25 yof with clinical stage II right breast cancer.  We discussed all options and elected to proceed with lumpectomy/sn biopsy.   Procedure: After informed consent was obtained the patient was taken to the operating room. She first was given technetium in standard periareolar fashion. She had a pectoral block. She was given antibiotics. Sequential compression devices were on her legs. She was then placed under general anesthesia with an LMA. Then she was prepped and draped in the standard sterile surgical fashion. Surgical timeout was then performed.   I then located the seed in thesuperiorleftbreast.I infiltrated marcaine in the skin. I made a periareolar incision to hide the scar. I then used the neoprobe to remove the seed and the surrounding tissue with attempt to get clear margins. I marked this with paint. MM confirmed removal of seed and the clip.I did remove some extra margins after pathology reviewed this. I placed clips in the cavity.I then obtained hemostasis. This was marked as above.I closed with 2-0 vicryl to approximate breast tissue. The skin was closed with 3-0 vicryl and 4-0 monocryl. Glue and steristrips were placed.   I then made a low axillary incision after locating the sentinel node. There was radioactivity present that was easily noted. I removed the radioactive nodes containing technetium.  The background  radioactivity was minimal.I then obtained hemostasis. I closed the axillary fascia with 2-0 vicryl.The skin was closed with 3-0 vicryl and 4-0 monocryl. Glue and steristrips were applied.

## 2017-07-08 NOTE — H&P (Signed)
52 yof referred by Dr Isidore Moos for new right breast cancer. 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 she has a roommate and has children who live in area also. she noted a right breast mass about a month ago. she has no personal history of breast disease. she has no family history of breast or ovarian cancer. she has no nipple dc. she underwent evaluation with mm and [place]. there is a mass in uoq of right breast. on [place] this measures 2.9x2.8x.2.5 cm in size. there is a single node with a thickened cortex. biopsy of the node is benign and concordant. 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 she is here by herself today to discuss options  Past Surgical History Conni Slipper, RN; 07/02/2017 7:56 AM) Breast Biopsy  Right. Gallbladder Surgery - Laparoscopic  Oral Surgery   Diagnostic Studies History Conni Slipper, RN; 07/02/2017 7:56 AM) Colonoscopy  never Mammogram  within last year Pap Smear  1-5 years ago  Medication History Conni Slipper, RN; 07/02/2017 7:56 AM) Medications Reconciled  Social History Conni Slipper, RN; 07/02/2017 7:56 AM) Alcohol use  Occasional alcohol use. Caffeine use  Carbonated beverages, Coffee. No drug use  Tobacco use  Never smoker.  Family History Conni Slipper, RN; 07/02/2017 7:56 AM) Alcohol Abuse  Family Members In General. Arthritis  Family Members In General, Father, Mother. Depression  Mother, Sister. Diabetes Mellitus  Family Members In General, Father. Heart Disease  Family Members In General, Father, Mother. Heart disease in female family member before age 80  Hypertension  Family Members In General, Father, Mother. Kidney Disease  Father. Seizure disorder  Mother. Thyroid problems  Daughter.  Pregnancy / Birth History Conni Slipper, RN; 07/02/2017 7:56 AM) Age at menarche  42 years. Age of menopause  [age] Contraceptive History  Oral contraceptives. Gravida  3 Irregular  periods  Maternal age  22-20 Para  3  Other Problems Conni Slipper, RN; 07/02/2017 7:56 AM) Anxiety Disorder  Breast Cancer  Cholelithiasis  Lump In Breast  Migraine Headache   Review of Systems Conni Slipper RN; 07/02/2017 7:56 AM) General Not Present- Appetite Loss, Chills, Fatigue, Fever, Night Sweats, Weight Gain and Weight Loss. Skin Not Present- Change in Wart/Mole, Dryness, Hives, Jaundice, New Lesions, Non-Healing Wounds, Rash and Ulcer. HEENT Present- Seasonal Allergies. Not Present- Earache, Hearing Loss, Hoarseness, Nose Bleed, Oral Ulcers, Ringing in the Ears, Sinus Pain, Sore Throat, Visual Disturbances, Wears glasses/contact lenses and Yellow Eyes. Respiratory Present- Snoring. Not Present- Bloody sputum, Chronic Cough, Difficulty Breathing and Wheezing. Breast Present- Breast Mass. Not Present- Breast Pain, Nipple Discharge and Skin Changes. Cardiovascular Present- Leg Cramps and Swelling of Extremities. Not Present- Chest Pain, Difficulty Breathing Lying Down, Palpitations, Rapid Heart Rate and Shortness of Breath. Gastrointestinal Present- Indigestion. Not Present- Abdominal Pain, Bloating, Bloody Stool, Change in Bowel Habits, Chronic diarrhea, Constipation, Difficulty Swallowing, Excessive gas, Gets full quickly at meals, Hemorrhoids, Nausea, Rectal Pain and Vomiting. Female Genitourinary Not Present- Frequency, Nocturia, Painful Urination, Pelvic Pain and Urgency. Musculoskeletal Not Present- Back Pain, Joint Pain, Joint Stiffness, Muscle Pain, Muscle Weakness and Swelling of Extremities. Neurological Not Present- Decreased Memory, Fainting, Headaches, Numbness, Seizures, Tingling, Tremor, Trouble walking and Weakness. Psychiatric Not Present- Anxiety, Bipolar, Change in Sleep Pattern, Depression, Fearful and Frequent crying. Endocrine Not Present- Cold Intolerance, Excessive Hunger, Hair Changes, Heat Intolerance, Hot flashes and New Diabetes. Hematology Not Present-  Blood Thinners, Easy Bruising, Excessive  bleeding, Gland problems, HIV and Persistent Infections.   Physical Exam [name] MD; 07/02/2017 3:34 PM) General Mental Status-Alert. Head and Neck Trachea-midline. Thyroid Gland Characteristics - normal size and consistency. Eye Sclera/Conjunctiva - Bilateral-No scleral icterus. Chest and Lung Exam Chest and lung exam reveals -quiet, even and easy respiratory effort with no use of accessory muscles and on auscultation, normal breath sounds, no adventitious sounds and normal vocal resonance. Breast Nipples-No Discharge. Note: 3 cm mobile right breast mass about 4-5 cm from nac at 12 oclock Cardiovascular Cardiovascular examination reveals -normal heart sounds, regular rate and rhythm with no murmurs. Abdomen Note: soft nontender well healed lap chole scars Peripheral Vascular Note: no le edema Neuropsychiatric Mental status exam performed with findings of-Oriented X3 with appropriate mood and affect. Lymphatic Head & Neck General Head & Neck Lymphatics: Bilateral - Description - Normal. Axillary General Axillary Region: Bilateral - Description - Normal. Note: no  adenopathy   Assessment & Plan [name] MD; 07/02/2017 3:37 PM) BREAST CANCER OF UPPER-OUTER QUADRANT OF RIGHT FEMALE BREAST (C50.411) Story: Right breast seed guided lumpectomy, right axillary sn biopsy We discussed for about an hour staging and pathophysiology of breast cancer. We discussed all of the different options for treatment for breast cancer including surgery, chemotherapy, radiation therapy, and antiestrogen therapy. We discussed a sentinel lymph node biopsy as she does not appear to having lymph node involvement right now. her node biopsy is negative and [place] is otherwise ok. We discussed the performance of that with injection of radioactive tracer. We discussed that she would have an incision underneath her axillary hairline. We  discussed that there is a chance of having a positive node with a sentinel lymph node biopsy and we will await the permanent pathology to make any other first further decisions in terms of her treatment. One of these options might be to return to the operating room to perform an axillary lymph node dissection. We discussed up to a 5% risk lifetime of chronic shoulder pain as well as lymphedema associated with a sentinel lymph node biopsy. We discussed the options for treatment of the breast cancer which included lumpectomy versus a mastectomy. We discussed a 5-10% chance of a positive margin requiring reexcision in the operating room. We also discussed that she will need radiation therapy if she undergoes lumpectomy. We discussed the mastectomy and the postoperative care for that as well. Mastectomy can be followed by reconstruction. This is a more extensive surgery and requires more recovery. The decision for lumpectomy vs mastectomy has no impact on decision for chemotherapy. Most mastectomy patients will not need radiation. We discussed that there is no difference in her survival whether she undergoes lumpectomy with radiation therapy or antiestrogen therapy versus a mastectomy. There is also no real difference between her recurrence in the breast. discussed risks of surgery. she would like to proceed with bct-breast size large and can do.

## 2017-07-08 NOTE — Anesthesia Postprocedure Evaluation (Signed)
Anesthesia Post Note  Patient: Caitlin Cohen  Procedure(s) Performed: RIGHT LUMPTECTOMY WITH RADIOACTIVE SEED GUIDED EXCISION AND RIGHT AXILLARY SENTINEL LYMPH NODE BIOPSY ERAS PATHWAY (Right Breast)     Patient location during evaluation: PACU Anesthesia Type: Regional and General Level of consciousness: awake and alert Pain management: pain level controlled Vital Signs Assessment: post-procedure vital signs reviewed and stable Respiratory status: spontaneous breathing, nonlabored ventilation, respiratory function stable and patient connected to nasal cannula oxygen Cardiovascular status: blood pressure returned to baseline and stable Postop Assessment: no apparent nausea or vomiting Anesthetic complications: no    Last Vitals:  Vitals:   07/08/17 0945 07/08/17 0949  BP:  124/84  Pulse: 84 85  Resp: 13 16  Temp:    SpO2: 94% 95%    Last Pain:  Vitals:   07/08/17 0944  TempSrc:   PainSc: 7                  Barnet Glasgow

## 2017-07-08 NOTE — Progress Notes (Signed)
Stable/ off monitor as now at level of care of floor

## 2017-07-08 NOTE — Anesthesia Procedure Notes (Signed)
Procedure Name: Intubation Date/Time: 07/08/2017 7:52 AM Performed by: Genelle Bal, CRNA Pre-anesthesia Checklist: Patient identified, Emergency Drugs available, Suction available and Patient being monitored Patient Re-evaluated:Patient Re-evaluated prior to induction Oxygen Delivery Method: Circle system utilized Preoxygenation: Pre-oxygenation with 100% oxygen Induction Type: IV induction Ventilation: Mask ventilation without difficulty Laryngoscope Size: Miller and 2 Grade View: Grade I Tube type: Oral Tube size: 7.0 mm Number of attempts: 1 Airway Equipment and Method: Stylet and Oral airway Placement Confirmation: ETT inserted through vocal cords under direct vision,  positive ETCO2 and breath sounds checked- equal and bilateral Secured at: 22 cm Tube secured with: Tape Dental Injury: Teeth and Oropharynx as per pre-operative assessment

## 2017-07-09 ENCOUNTER — Encounter (HOSPITAL_COMMUNITY): Payer: Self-pay | Admitting: General Surgery

## 2017-07-09 DIAGNOSIS — C50411 Malignant neoplasm of upper-outer quadrant of right female breast: Secondary | ICD-10-CM | POA: Diagnosis not present

## 2017-07-09 MED ORDER — OXYCODONE HCL 5 MG PO TABS: 5.0000 mg | ORAL_TABLET | Freq: Four times a day (QID) | ORAL | 0 refills | Status: DC | PRN

## 2017-07-09 NOTE — Progress Notes (Signed)
1041 Patient discharged to home. Verbalizes understanding of all discharge instructions including incision care, discharge medications, and follow up MD visits. Patient accompanied by friend.

## 2017-07-09 NOTE — Discharge Summary (Signed)
Physician Discharge Summary  Patient ID: Caitlin Cohen MRN: [id number] DOB/AGE: 1965-05-19 52 y.o.  Admit date: 07/08/2017 Discharge date: 07/09/2017  Admission Diagnoses: Right breast cancer  Discharge Diagnoses:  Right breast cancer  Discharged Condition: good  Hospital Course: 21 yof s/p right breast lumpectomy/sn biopsy remained overnight due to comorbidities and general anesthesia. Doing well in am without any events  Consults: None  Significant Diagnostic Studies: none  Treatments: surgery: right seed guided lumpectomy/sn biopsy  Discharge Exam: Blood pressure 134/85, pulse 84, temperature 98.4 F (36.9 C), temperature source Oral, resp. rate 18, SpO2 96 %. Incision/Wound:axillary and periareolar incisions clean without hematoma  Disposition: Discharge disposition: 01-Home or Self Care        Allergies as of 07/09/2017   No Known Allergies     Medication List    TAKE these medications   oxyCODONE 5 MG immediate release tablet Commonly known as:  Oxy IR/ROXICODONE Take 1 tablet (5 mg total) by mouth every 6 (six) hours as needed for moderate pain.      Follow-up Information    [name], MD In 3 weeks.   Specialty:  General Surgery Contact information: [address] [place] [address] [phone]           Signed: [name] 07/09/2017, 7:37 AM

## 2017-07-14 ENCOUNTER — Telehealth: Payer: Self-pay | Admitting: *Deleted

## 2017-07-14 NOTE — Telephone Encounter (Signed)
Received order for oncotype testing. Requisition faxed to pathology. Received by Keisha 

## 2017-07-21 ENCOUNTER — Encounter (HOSPITAL_COMMUNITY): Payer: Self-pay | Admitting: *Deleted

## 2017-07-22 ENCOUNTER — Telehealth: Payer: Self-pay | Admitting: *Deleted

## 2017-07-22 NOTE — Telephone Encounter (Signed)
Received oncotype score of 29/18%. Physician team notified.

## 2017-07-23 ENCOUNTER — Telehealth: Payer: Self-pay | Admitting: *Deleted

## 2017-07-23 NOTE — Telephone Encounter (Signed)
Discussed oncotype score of 29. Informed pt Dr. Burr Medico would like to discuss results and what the next steps are based on the results. Scheduled and confirmed appt on 4/12 to see Lacie and Dr. Burr Medico. Denies further questions at this time.

## 2017-07-23 NOTE — Telephone Encounter (Signed)
Left vm requesting return call to discuss oncotype results and f/u appt with Dr. Burr Medico. Contact information provided.

## 2017-07-25 ENCOUNTER — Inpatient Hospital Stay: Payer: BLUE CROSS/BLUE SHIELD | Attending: Hematology | Admitting: Nurse Practitioner

## 2017-07-25 ENCOUNTER — Telehealth: Payer: Self-pay | Admitting: Hematology

## 2017-07-25 ENCOUNTER — Other Ambulatory Visit: Payer: Self-pay | Admitting: General Surgery

## 2017-07-25 ENCOUNTER — Encounter: Payer: Self-pay | Admitting: Nurse Practitioner

## 2017-07-25 VITALS — BP 137/87 | HR 100 | Temp 98.5°F | Resp 17 | Ht 67.0 in | Wt 350.3 lb

## 2017-07-25 DIAGNOSIS — C50211 Malignant neoplasm of upper-inner quadrant of right female breast: Secondary | ICD-10-CM | POA: Insufficient documentation

## 2017-07-25 DIAGNOSIS — F418 Other specified anxiety disorders: Secondary | ICD-10-CM | POA: Diagnosis not present

## 2017-07-25 DIAGNOSIS — Z170 Estrogen receptor positive status [ER+]: Secondary | ICD-10-CM | POA: Diagnosis not present

## 2017-07-25 DIAGNOSIS — Z5189 Encounter for other specified aftercare: Secondary | ICD-10-CM | POA: Insufficient documentation

## 2017-07-25 NOTE — Telephone Encounter (Signed)
Scheduled appt per 4/12 los - Gave patient AVS and calender per los.  

## 2017-07-25 NOTE — Progress Notes (Addendum)
Caitlin Cohen  [phone]) [phone] [phone]) [phone]  Clinic Follow up Note   Patient Care Team: Lucianne Lei, MD as PCP - General (Family Medicine) Truitt Merle, MD as Consulting Physician (Hematology) [name], MD as Consulting Physician (General Surgery) [name], MD as Attending Physician (Radiation Oncology) 07/25/2017  SUMMARY OF ONCOLOGIC HISTORY:  Cancer Staging Malignant neoplasm of upper-inner quadrant of right breast in female, estrogen receptor positive ([place]) Staging form: Breast, AJCC 8th Edition - Clinical stage from 06/26/2017: Stage IIA (cT2, cN0, cM0, G3, ER: Positive, PR: Positive, HER2: Negative) - Signed by Truitt Merle, MD on 07/01/2017 - Pathologic stage from 07/08/2017: Stage IB (pT2, pN0, cM0, G3, ER+, PR+, HER2-, Oncotype DX score: 29) - Signed by [name], NP on 07/25/2017  Oncology History   Cancer Staging Malignant neoplasm of upper-inner quadrant of right breast in female, estrogen receptor positive ([place]) Staging form: Breast, AJCC 8th Edition - Clinical stage from 06/26/2017: Stage IIA (cT2, cN0, cM0, G3, ER: Positive, PR: Positive, HER2: Negative) - Signed by Truitt Merle, MD on 07/01/2017       Malignant neoplasm of upper-inner quadrant of right breast in female, estrogen receptor positive ([place])   06/23/2017 Mammogram    IMPRESSION: 1. There is a highly suspicious mass measuring 3.5 cm mammographically in the right breast at the palpable site identified by the patient. 2. There is 1 suspicious lymph node with cortex measuring up to 6 mm. 3.  No mammographic evidence of malignancy in the left breast.      06/26/2017 Initial Biopsy    Diagnosis 06/26/17 1. Breast, right, needle core biopsy, upper inner quadrant, 12:30 o'clock, 7cm from nipple - INVASIVE DUCTAL CARCINOMA - SEE COMMENT 2. Lymph node, needle/core biopsy, right axillary (level 2 node) - NO CARCINOMA IDENTIFIED IN ONE LYMPH NODE (0/1)      06/26/2017  Receptors her2    Prognostic indicators significant for: ER, 70% positive and PR, 70% positive, both with strong staining intensity. Proliferation marker Ki67 at 60%. HER2 negative.      07/01/2017 Initial Diagnosis    Malignant neoplasm of upper-inner quadrant of right breast in female, estrogen receptor positive ([place])      07/08/2017 Pathology Results    Diagnosis 1. Breast, lumpectomy, Right - INVASIVE DUCTAL CARCINOMA, NOTTINGHAM GRADE 3 OF 3, 3.5 CM - MARGINS UNINVOLVED BY CARCINOMA (0.1 CM, SUPERIOR MARGIN) - PREVIOUS BIOPSY SITE CHANGES - SEE ONCOLOGY TABLE BELOW 2. Soft tissue, biopsy, Axillary - BENIGN FIBROADIPOSE TISSUE - NO MALIGNANCY IDENTIFIED 3. Lymph node, sentinel, biopsy, Right axillary - NO CARCINOMA IDENTIFIED IN ONE LYMPH NODE (0/1) 4. Lymph node, sentinel, biopsy, Right axillary - NO CARCINOMA IDENTIFIED IN ONE LYMPH NODE (0/1) - PREVIOUS BIOPSY SITE CHANGES - SEE COMMENT 5. Breast, excision, Right superior margin - BENIGN BREAST TISSUE - NO RESIDUAL CARCINOMA IDENTIFIED 6. Breast, excision, Right inferior margin - BENIGN BREAST TISSUE - NO RESIDUAL CARCINOMA IDENTIFIED      07/08/2017 Cancer Staging    Staging form: Breast, AJCC 8th Edition - Pathologic stage from 07/08/2017: Stage IB (pT2, pN0, cM0, G3, ER+, PR+, HER2-, Oncotype DX score: 29) - Signed by [name], NP on 07/25/2017      07/22/2017 Oncotype testing    Recurrence Score: 29 Distant Recurrence Risk at 9 years with Ai or Tamoxifen alone: 18% Absolute Chemotherapy Benefit: > 15 %      CURRENT THERAPY: pending adjuvant TC q3 weeks x4 cycles, starting ~4/26  INTERVAL HISTORY: Caitlin Cohen returns  for follow up as scheduled following lumpectomy per Dr. Donne Hazel on 07/08/17. She is recovering well, has gone back to work. Right axillary pain is minimal, more irritating, rates it 4/10. Occasionally takes advil. Has good mobility, no swelling. Denies recent fever or chills.   REVIEW OF SYSTEMS:    Constitutional: Denies fevers, chills or abnormal weight loss Respiratory: Denies cough, dyspnea or wheezes Cardiovascular: Denies palpitation, chest discomfort or lower extremity swelling Gastrointestinal:  Denies nausea, vomiting, constipation, diarrhea, heartburn or change in bowel habits Skin: Denies abnormal skin rashes, drainage, or wound issues  Lymphatics: Denies new lymphadenopathy or easy bruising Neurological:Denies numbness, tingling or new weaknesses Behavioral/Psych: Mood is stable, no new changes  BREASTS: (+) right axillary discomfort/irritation, 4/10  All other systems were reviewed with the patient and are negative.  MEDICAL HISTORY:  Past Medical History:  Diagnosis Date  . Anxiety   . Depression   . GERD (gastroesophageal reflux disease)   . Headache     SURGICAL HISTORY: Past Surgical History:  Procedure Laterality Date  . CERVICAL CONE BIOPSY    . CHOLECYSTECTOMY    . MASTECTOMY WITH RADIOACTIVE SEED GUIDED EXCISION AND AXILLARY SENTINEL LYMPH NODE BIOPSY Right 07/08/2017   Procedure: RIGHT LUMPTECTOMY WITH RADIOACTIVE SEED GUIDED EXCISION AND RIGHT AXILLARY SENTINEL LYMPH NODE BIOPSY ERAS PATHWAY;  Surgeon: [name], MD;  Location: [facility];  Service: General;  Laterality: Right;  PEC BLOCK  . OPEN REDUCTION INTERNAL FIXATION (ORIF) DISTAL RADIAL FRACTURE  03/17/2012   Procedure: OPEN REDUCTION INTERNAL FIXATION (ORIF) DISTAL RADIAL FRACTURE;  Surgeon: Roseanne Kaufman, MD;  Location: [facility];  Service: Orthopedics;  Laterality: Right;  Pre-operative a supra-clavical block right arm in addition to general anesthesia  . TUBAL LIGATION      I have reviewed the social history and family history with the patient and they are unchanged from previous note.  ALLERGIES:  has No Known Allergies.  MEDICATIONS:  Current Outpatient Medications  Medication Sig Dispense Refill  . oxyCODONE (OXY IR/ROXICODONE) 5 MG immediate release tablet Take 1 tablet (5 mg total)  by mouth every 6 (six) hours as needed for moderate pain. (Patient not taking: Reported on 07/25/2017) 10 tablet 0   No current facility-administered medications for this visit.     PHYSICAL EXAMINATION: ECOG PERFORMANCE STATUS: 1 - Symptomatic but completely ambulatory  Vitals:   07/25/17 1414  BP: 137/87  Pulse: 100  Resp: 17  Temp: 98.5 F (36.9 C)  SpO2: 100%   Filed Weights   07/25/17 1414  Weight: (!) 350 lb 4.8 oz (158.9 kg)    GENERAL:alert, no distress and comfortable SKIN: skin color, texture, turgor are normal, no rashes or significant lesions EYES: normal, Conjunctiva are pink and non-injected, sclera clear LYMPH:  no palpable cervical, supraclavicular, or axillary lymphadenopathy LUNGS: clear to auscultation with normal breathing effort HEART: regular rate & rhythm and no murmurs and no lower extremity edema  ABDOMEN:abdomen soft, non-tender and normal bowel sounds NEURO: alert & oriented x 3 with fluent speech, no focal motor/sensory deficits BREASTS: inspection shows breasts to be symmetrical without nipple discharge. Left breast benign. (+) s/p right lumpectomy with SLNB, axillary and areolar incisions are healing well, no erythema or drainage. 7x4 cm soft tissue fullness at the 11 o'clock position above the nipple, non-tender.   LABORATORY DATA:  I have reviewed the data as listed CBC Latest Ref Rng & Units 07/02/2017 06/22/2012 03/17/2012  WBC 3.9 - 10.3 K/uL 9.1 13.9(H) 9.0  Hemoglobin 12.0 - 15.0  g/dL - 12.4 12.1  Hematocrit 34.8 - 46.6 % 40.0 36.7 37.0  Platelets 145 - 400 K/uL 293 384 349     CMP Latest Ref Rng & Units 07/02/2017 06/22/2012  Glucose 70 - 140 mg/dL 109 102(H)  BUN 7 - 26 mg/dL 14 15  Creatinine 0.60 - 1.10 mg/dL 1.13(H) 0.91  Sodium 136 - 145 mmol/L 141 137  Potassium 3.5 - 5.1 mmol/L 4.1 4.2  Chloride 98 - 109 mmol/L 105 104  CO2 22 - 29 mmol/L 28 23  Calcium 8.4 - 10.4 mg/dL 9.5 9.5  Total Protein 6.4 - 8.3 g/dL 8.4(H) 8.6(H)    Total Bilirubin 0.2 - 1.2 mg/dL 0.3 0.2(L)  Alkaline Phos 40 - 150 U/L 90 79  AST 5 - 34 U/L 22 19  ALT 0 - 55 U/L 18 12    SURGICAL PATH 07/08/17  Diagnosis 1. Breast, lumpectomy, Right - INVASIVE DUCTAL CARCINOMA, NOTTINGHAM GRADE 3 OF 3, 3.5 CM - MARGINS UNINVOLVED BY CARCINOMA (0.1 CM, SUPERIOR MARGIN) - PREVIOUS BIOPSY SITE CHANGES - SEE ONCOLOGY TABLE BELOW 2. Soft tissue, biopsy, Axillary - BENIGN FIBROADIPOSE TISSUE - NO MALIGNANCY IDENTIFIED 3. Lymph node, sentinel, biopsy, Right axillary - NO CARCINOMA IDENTIFIED IN ONE LYMPH NODE (0/1) 4. Lymph node, sentinel, biopsy, Right axillary - NO CARCINOMA IDENTIFIED IN ONE LYMPH NODE (0/1) - PREVIOUS BIOPSY SITE CHANGES - SEE COMMENT 5. Breast, excision, Right superior margin - BENIGN BREAST TISSUE - NO RESIDUAL CARCINOMA IDENTIFIED 6. Breast, excision, Right inferior margin - BENIGN BREAST TISSUE - NO RESIDUAL CARCINOMA IDENTIFIED  Microscopic Comment 1. BREAST, INVASIVE TUMOR Procedure: Excision Laterality: Right Tumor Size: 3.5 cm Histologic Type: Invasive ductal of no special type (ductal, not otherwise specified) Grade: Nottingham Grade 3 Tubular Differentiation: 3 Nuclear Pleomorphism: 2 Mitotic Count: 2 Ductal Carcinoma in Situ (DCIS): Not identified Margins: Uninvolved by carcinoma Invasive carcinoma, distance from closest margin: 0.2 cm, anterior margin (See comment below) DCIS, distance from closest margin: N/A Treatment effect: No known presurgical therapy Regional Lymph Nodes: Number of Lymph Nodes Examined: 2 Number of Sentinel Lymph Nodes Examined:2 Lymph Nodes with Macrometastases: 0 Lymph Nodes with Micrometastases: 0 Lymph Nodes with Isolated Tumor Cells: 0 Breast Prognostic Profile: [id number] Estrogen Receptor: Positive (70%, strong) Progesterone Receptor: Positive (70%, strong) Her2: Negative (Ratio 1.21) Ki-67: 60% Best tumor block for sendout testing: 1A Pathologic Stage  Classification (pTNM, AJCC 8th Edition): Primary Tumor: pT2 Regional Lymph Nodes: pN0 Comments: On the excision specimen (part 1), the tumor comes within 0.1 cm of the superior margin, 0.2 cm from the anterior margin and 0.5 cm from the inferior margin based on microscopic measurements. Additional superior and inferior margins were submitted which have no residual carcinoma; therefore, the closest margin is the anterior margin. 4. There is no evidence of isolated tumor cells in the examined lymph node, which is supported by negative cytokeratin AE1/3.  ONCOTYPE 29  RADIOGRAPHIC STUDIES: I have personally reviewed the radiological images as listed and agreed with the findings in the report. No results found.   ASSESSMENT & PLAN: 52 y.o. pre-menopausal woman, presented with screening discovered to Ductal carcinoma  1.  Malignant neoplasm of the upper-ourter quadrant of right breast, base of ductal carcinoma, pT2N0M0, stage IB, grade 3, ER+ /PR +, HER2 -, Oncotype RS 29 -We reviewed her pathology and oncotype score in detail. She had complete resection, margins are negative. She has recovered well from right breast lumpectomy and SLNB. She has grade 3, ER/PR positive, HER2 negative, and node-negative  disease. Given her age, pathologic features, and oncotype score, she will benefit from adjuvant chemotherapy. Dr. Burr Medico recommends TC q3 weeks x4 cycles. She has no other co-morbidities and has good social support, she would be a good candidate for chemotherapy.  -Chemotherapy consent: Side effects including but not not limited to fatigue, nausea, vomiting, diarrhea, hair loss, neuropathy, fluid retention, renal and kidney dysfunction, neuropathy, neutropenic fever, need for blood transfusion, bleeding were discussed with patient in great detail. She agrees to proceed. She will attend chemo class.  -she has poor peripheral venous access, I explained PAC, she agrees. Will have Dr. Donne Hazel place port  before chemotherapy.  -Research RN screened her for Upbeat study, but she does not qualify due to weight limit for cardiac MRI table, I informed her. -Chemo class in 1 week -lab, flush, f/u and cycle 1 TC in 2 weeks -I plan to see her back 1 week after first chemo to monitor her side effects  2. Depression/anxiety -Her main concern is having to feel dependent on others, but she has good social support. I contacted our breast RN navigator to help assist her with transportation, she agrees.   3. Cancer screening -Pending results of cologuard test   PLAN: -Chemo class in 1 week -PAC placement per Dr. Donne Hazel -Lab, flush, f/u, and chemo in 2 weeks -F/u 1 week after chemo for symptom management with me  All questions were answered. The patient knows to call the clinic with any problems, questions or concerns. No barriers to learning was detected. I spent 20 minutes counseling the patient face to face. The total time spent in the appointment was 25 minutes and more than 50% was on counseling and review of test results     [name], NP 07/25/17   Addendum  I have seen the patient, examined her. I agree with the assessment and and plan and have edited the notes.   Caitlin Cohen tolerated breast lumpectomy and sentinel lymph node biopsy very well, and has recovered well.  I reviewed her surgical pathology findings, and the Oncotype results.  Her tumor is 3.5 cm, with negative margins and lymph nodes.  Her Oncotype showed recurrence score 29, which is high risk, based on the new TALORx study.  Given her relatively young age at 55, I recommend her adjuvant chemotherapy with docetaxel and Cytoxan every 3 weeks for a total of 4 cycles.  Potential benefits and side effects reviewed with patient, chemo consent was obtained.  Plan to start chemo in 2 weeks. We discussed the UPBEAT clinical trail, she is interested, will meet our research nurse.   I have reviewed the above documentation for accuracy  and completeness, and I agree with the above.  Truitt Merle  07/26/2017

## 2017-07-26 ENCOUNTER — Encounter: Payer: Self-pay | Admitting: Nurse Practitioner

## 2017-07-26 ENCOUNTER — Other Ambulatory Visit: Payer: Self-pay | Admitting: Hematology

## 2017-07-26 DIAGNOSIS — Z170 Estrogen receptor positive status [ER+]: Principal | ICD-10-CM

## 2017-07-26 DIAGNOSIS — C50211 Malignant neoplasm of upper-inner quadrant of right female breast: Secondary | ICD-10-CM

## 2017-07-26 MED ORDER — PROCHLORPERAZINE MALEATE 10 MG PO TABS: 10.0000 mg | ORAL_TABLET | Freq: Four times a day (QID) | ORAL | 1 refills | Status: DC | PRN

## 2017-07-26 MED ORDER — ONDANSETRON HCL 8 MG PO TABS: 8.0000 mg | ORAL_TABLET | Freq: Two times a day (BID) | ORAL | 1 refills | Status: DC | PRN

## 2017-07-26 MED ORDER — DEXAMETHASONE 4 MG PO TABS: 4.0000 mg | ORAL_TABLET | Freq: Two times a day (BID) | ORAL | 1 refills | Status: DC

## 2017-07-26 NOTE — Progress Notes (Signed)
START ON PATHWAY REGIMEN - Breast     A cycle is every 21 days:     Docetaxel      Cyclophosphamide   **Always confirm dose/schedule in your pharmacy ordering system**    Patient Characteristics: Postoperative without Neoadjuvant Therapy (Pathologic Staging), Invasive Disease, Adjuvant Therapy, HER2 Negative/Unknown/Equivocal, ER Positive, Node Negative, pT1a-c, pN0/N61m or pT2 or Higher, pN0, Oncotype High Risk (? 26) Therapeutic Status: Postoperative without Neoadjuvant Therapy (Pathologic Staging) AJCC Grade: G3 AJCC N Category: pN0 AJCC M Category: cM0 ER Status: Positive (+) AJCC 8 Stage Grouping: IB HER2 Status: Negative (-) Oncotype Dx Recurrence Score: 29 AJCC T Category: pT2 PR Status: Positive (+) Has this patient completed genomic testing<= Yes - Oncotype DX(R) Intent of Therapy: Curative Intent, Discussed with Patient

## 2017-07-28 ENCOUNTER — Encounter: Payer: Self-pay | Admitting: *Deleted

## 2017-07-28 NOTE — Progress Notes (Signed)
[place] [employer]  Holiday representative received referral from Futures trader for transportation concerns.  CSW contacted patient at home to offer support and assess for needs.  Patient stated her daughter and friends were currently providing transportation to her appointments, but would need additional assistance with the increase in appointments.  CSW and patient discussed transportation resources available; including [facility] and [facility].  Patient expressed interest in [place] [facility].  CSW provided patient with contact information and instructions for [facility] program.  Patient plans to call [facility] to enroll in the program.  CSW provided contact information and encouraged patient to call with any questions, concerns or additional needs.   Johnnye Lana, MSW, LCSW, OSW-C Clinical Social Worker [facility] [phone]

## 2017-07-29 ENCOUNTER — Other Ambulatory Visit: Payer: Self-pay

## 2017-07-29 ENCOUNTER — Telehealth: Payer: Self-pay

## 2017-07-29 ENCOUNTER — Encounter (HOSPITAL_COMMUNITY): Payer: Self-pay | Admitting: *Deleted

## 2017-07-29 MED ORDER — DEXTROSE 5 % IV SOLN
3.0000 g | INTRAVENOUS | Status: AC
  Administered 2017-07-30: 3 g via INTRAVENOUS
  Filled 2017-07-29: qty 3

## 2017-07-29 NOTE — Progress Notes (Signed)
Spoke with pt for pre-op call. Pt denies cardiac history. Pt was here 07/08/17 for surgery and she states nothing has changed with her medications, medical and surgical history.

## 2017-07-29 NOTE — Telephone Encounter (Signed)
Per RN approval patient appointments were changed at patient request date for lab f/u, and provider visit. Spoke with patient and confirmed these times and mailed a calender andd letter. Per 4/16 phone message return.

## 2017-07-30 ENCOUNTER — Ambulatory Visit (HOSPITAL_COMMUNITY): Payer: Medicaid Other | Admitting: Anesthesiology

## 2017-07-30 ENCOUNTER — Ambulatory Visit (HOSPITAL_COMMUNITY): Payer: Medicaid Other

## 2017-07-30 ENCOUNTER — Encounter (HOSPITAL_COMMUNITY): Payer: Self-pay | Admitting: Anesthesiology

## 2017-07-30 ENCOUNTER — Encounter (HOSPITAL_COMMUNITY)
Admission: RE | Disposition: A | Discharge status: Home / Self Care | Payer: Self-pay | Source: Ambulatory Visit | Attending: General Surgery

## 2017-07-30 ENCOUNTER — Ambulatory Visit (HOSPITAL_COMMUNITY)
Admission: RE | Admit: 2017-07-30 | Discharge: 2017-07-30 | Disposition: A | Discharge status: Home / Self Care | Payer: Medicaid Other | Source: Ambulatory Visit | Attending: General Surgery | Admitting: General Surgery

## 2017-07-30 DIAGNOSIS — Z95828 Presence of other vascular implants and grafts: Secondary | ICD-10-CM

## 2017-07-30 DIAGNOSIS — F329 Major depressive disorder, single episode, unspecified: Secondary | ICD-10-CM | POA: Insufficient documentation

## 2017-07-30 DIAGNOSIS — K219 Gastro-esophageal reflux disease without esophagitis: Secondary | ICD-10-CM | POA: Diagnosis not present

## 2017-07-30 DIAGNOSIS — Z6841 Body Mass Index (BMI) 40.0 and over, adult: Secondary | ICD-10-CM | POA: Insufficient documentation

## 2017-07-30 DIAGNOSIS — Z170 Estrogen receptor positive status [ER+]: Secondary | ICD-10-CM | POA: Diagnosis not present

## 2017-07-30 DIAGNOSIS — C50411 Malignant neoplasm of upper-outer quadrant of right female breast: Secondary | ICD-10-CM | POA: Insufficient documentation

## 2017-07-30 DIAGNOSIS — Z79899 Other long term (current) drug therapy: Secondary | ICD-10-CM | POA: Diagnosis not present

## 2017-07-30 DIAGNOSIS — F419 Anxiety disorder, unspecified: Secondary | ICD-10-CM | POA: Diagnosis not present

## 2017-07-30 DIAGNOSIS — Z419 Encounter for procedure for purposes other than remedying health state, unspecified: Secondary | ICD-10-CM

## 2017-07-30 HISTORY — DX: Malignant (primary) neoplasm, unspecified: C80.1

## 2017-07-30 HISTORY — PX: PORTACATH PLACEMENT: SHX2246

## 2017-07-30 HISTORY — DX: Anemia, unspecified: D64.9

## 2017-07-30 LAB — CBC
HEMATOCRIT: 41.1 % (ref 36.0–46.0)
HEMOGLOBIN: 13.3 g/dL (ref 12.0–15.0)
MCH: 27.1 pg (ref 26.0–34.0)
MCHC: 32.4 g/dL (ref 30.0–36.0)
MCV: 83.9 fL (ref 78.0–100.0)
Platelets: 319 10*3/uL (ref 150–400)
RBC: 4.9 MIL/uL (ref 3.87–5.11)
RDW: 14.8 % (ref 11.5–15.5)
WBC: 8.5 10*3/uL (ref 4.0–10.5)

## 2017-07-30 LAB — BASIC METABOLIC PANEL
ANION GAP: 9 (ref 5–15)
BUN: 19 mg/dL (ref 6–20)
CHLORIDE: 109 mmol/L (ref 101–111)
CO2: 20 mmol/L — AB (ref 22–32)
Calcium: 9 mg/dL (ref 8.9–10.3)
Creatinine, Ser: 0.92 mg/dL (ref 0.44–1.00)
GFR calc non Af Amer: >60 mL/min (ref >60)
Glucose, Bld: 121 mg/dL — ABNORMAL HIGH (ref 65–99)
Potassium: 3.8 mmol/L (ref 3.5–5.1)
SODIUM: 138 mmol/L (ref 135–145)

## 2017-07-30 LAB — HCG, SERUM, QUALITATIVE: PREG SERUM: NEGATIVE

## 2017-07-30 SURGERY — INSERTION, TUNNELED CENTRAL VENOUS DEVICE, WITH PORT
Anesthesia: General | Site: Chest

## 2017-07-30 MED ORDER — PROPOFOL 10 MG/ML IV BOLUS
INTRAVENOUS | Status: AC
  Filled 2017-07-30: qty 20

## 2017-07-30 MED ORDER — FENTANYL CITRATE (PF) 100 MCG/2ML IJ SOLN
25.0000 ug | INTRAMUSCULAR | Status: DC | PRN
  Administered 2017-07-30: 50 ug via INTRAVENOUS

## 2017-07-30 MED ORDER — DEXAMETHASONE SODIUM PHOSPHATE 10 MG/ML IJ SOLN
INTRAMUSCULAR | Status: AC
  Filled 2017-07-30: qty 1

## 2017-07-30 MED ORDER — 0.9 % SODIUM CHLORIDE (POUR BTL) OPTIME
TOPICAL | Status: DC | PRN
  Administered 2017-07-30: 1000 mL

## 2017-07-30 MED ORDER — FENTANYL CITRATE (PF) 250 MCG/5ML IJ SOLN
INTRAMUSCULAR | Status: AC
  Filled 2017-07-30: qty 5

## 2017-07-30 MED ORDER — SODIUM CHLORIDE 0.9 % IV SOLN: 250.0000 mL | INTRAVENOUS | Status: DC | PRN

## 2017-07-30 MED ORDER — ONDANSETRON HCL 4 MG/2ML IJ SOLN
INTRAMUSCULAR | Status: AC
  Filled 2017-07-30: qty 2

## 2017-07-30 MED ORDER — LIDOCAINE 2% (20 MG/ML) 5 ML SYRINGE
INTRAMUSCULAR | Status: DC | PRN
  Administered 2017-07-30: 100 mg via INTRAVENOUS

## 2017-07-30 MED ORDER — LACTATED RINGERS IV SOLN
INTRAVENOUS | Status: DC
  Administered 2017-07-30: 10 mL/h via INTRAVENOUS

## 2017-07-30 MED ORDER — BUPIVACAINE HCL (PF) 0.25 % IJ SOLN
INTRAMUSCULAR | Status: AC
  Filled 2017-07-30: qty 30

## 2017-07-30 MED ORDER — SODIUM CHLORIDE 0.9% FLUSH: 3.0000 mL | Freq: Two times a day (BID) | INTRAVENOUS | Status: DC

## 2017-07-30 MED ORDER — MIDAZOLAM HCL 5 MG/5ML IJ SOLN
INTRAMUSCULAR | Status: DC | PRN
  Administered 2017-07-30: 2 mg via INTRAVENOUS

## 2017-07-30 MED ORDER — HEPARIN SOD (PORK) LOCK FLUSH 100 UNIT/ML IV SOLN
INTRAVENOUS | Status: AC
  Filled 2017-07-30: qty 5

## 2017-07-30 MED ORDER — DEXAMETHASONE SODIUM PHOSPHATE 4 MG/ML IJ SOLN
INTRAMUSCULAR | Status: DC | PRN
  Administered 2017-07-30: 10 mg via INTRAVENOUS

## 2017-07-30 MED ORDER — ACETAMINOPHEN 500 MG PO TABS
1000.0000 mg | ORAL_TABLET | ORAL | Status: AC
  Administered 2017-07-30: 1000 mg via ORAL
  Filled 2017-07-30: qty 2

## 2017-07-30 MED ORDER — SODIUM CHLORIDE 0.9% FLUSH: 3.0000 mL | INTRAVENOUS | Status: DC | PRN

## 2017-07-30 MED ORDER — HEPARIN SOD (PORK) LOCK FLUSH 100 UNIT/ML IV SOLN
INTRAVENOUS | Status: DC | PRN
  Administered 2017-07-30: 500 [IU] via INTRAVENOUS

## 2017-07-30 MED ORDER — FENTANYL CITRATE (PF) 100 MCG/2ML IJ SOLN
INTRAMUSCULAR | Status: DC | PRN
  Administered 2017-07-30: 25 ug via INTRAVENOUS
  Administered 2017-07-30: 50 ug via INTRAVENOUS

## 2017-07-30 MED ORDER — LIDOCAINE 2% (20 MG/ML) 5 ML SYRINGE
INTRAMUSCULAR | Status: AC
  Filled 2017-07-30: qty 5

## 2017-07-30 MED ORDER — BUPIVACAINE HCL (PF) 0.25 % IJ SOLN
INTRAMUSCULAR | Status: DC | PRN
  Administered 2017-07-30: 7 mL

## 2017-07-30 MED ORDER — ONDANSETRON HCL 4 MG/2ML IJ SOLN
INTRAMUSCULAR | Status: DC | PRN
  Administered 2017-07-30: 4 mg via INTRAVENOUS

## 2017-07-30 MED ORDER — HEPARIN SODIUM (PORCINE) 5000 UNIT/ML IJ SOLN
INTRAMUSCULAR | Status: DC | PRN
  Administered 2017-07-30: 500 mL

## 2017-07-30 MED ORDER — OXYCODONE HCL 5 MG/5ML PO SOLN
5.0000 mg | Freq: Once | ORAL | Status: DC | PRN
Start: 2017-07-30 — End: 2017-07-30

## 2017-07-30 MED ORDER — ACETAMINOPHEN 325 MG PO TABS: 650.0000 mg | ORAL_TABLET | ORAL | Status: DC | PRN

## 2017-07-30 MED ORDER — OXYCODONE HCL 5 MG PO TABS: 5.0000 mg | ORAL_TABLET | Freq: Once | ORAL | Status: DC | PRN

## 2017-07-30 MED ORDER — OXYCODONE HCL 5 MG PO TABS: 5.0000 mg | ORAL_TABLET | ORAL | Status: DC | PRN

## 2017-07-30 MED ORDER — FENTANYL CITRATE (PF) 100 MCG/2ML IJ SOLN
INTRAMUSCULAR | Status: AC
  Filled 2017-07-30: qty 2

## 2017-07-30 MED ORDER — ACETAMINOPHEN 325 MG PO TABS: 325.0000 mg | ORAL_TABLET | ORAL | Status: DC | PRN

## 2017-07-30 MED ORDER — MIDAZOLAM HCL 2 MG/2ML IJ SOLN
INTRAMUSCULAR | Status: AC
  Filled 2017-07-30: qty 2

## 2017-07-30 MED ORDER — PROPOFOL 10 MG/ML IV BOLUS
INTRAVENOUS | Status: DC | PRN
  Administered 2017-07-30: 200 mg via INTRAVENOUS

## 2017-07-30 MED ORDER — ENSURE PRE-SURGERY PO LIQD: 296.0000 mL | Freq: Once | ORAL | Status: DC

## 2017-07-30 MED ORDER — GABAPENTIN 100 MG PO CAPS
100.0000 mg | ORAL_CAPSULE | ORAL | Status: AC
  Administered 2017-07-30: 100 mg via ORAL
  Filled 2017-07-30 (×2): qty 1

## 2017-07-30 MED ORDER — ACETAMINOPHEN 650 MG RE SUPP: 650.0000 mg | RECTAL | Status: DC | PRN

## 2017-07-30 MED ORDER — ACETAMINOPHEN 160 MG/5ML PO SOLN: 325.0000 mg | ORAL | Status: DC | PRN

## 2017-07-30 MED ORDER — SODIUM CHLORIDE 0.9 % IV SOLN
INTRAVENOUS | Status: AC
  Filled 2017-07-30: qty 1.2

## 2017-07-30 SURGICAL SUPPLY — 50 items
ADH SKN CLS APL DERMABOND .7 (GAUZE/BANDAGES/DRESSINGS) ×1
BAG DECANTER FOR FLEXI CONT (MISCELLANEOUS) ×3 IMPLANT
BLADE SURG 11 STRL SS (BLADE) ×3 IMPLANT
BLADE SURG 15 STRL LF DISP TIS (BLADE) ×1 IMPLANT
BLADE SURG 15 STRL SS (BLADE) ×3
CHLORAPREP W/TINT 26ML (MISCELLANEOUS) ×3 IMPLANT
COVER SURGICAL LIGHT HANDLE (MISCELLANEOUS) ×3 IMPLANT
COVER TRANSDUCER ULTRASND GEL (DRAPE) ×3 IMPLANT
CRADLE DONUT ADULT HEAD (MISCELLANEOUS) ×3 IMPLANT
DECANTER SPIKE VIAL GLASS SM (MISCELLANEOUS) ×3 IMPLANT
DERMABOND ADVANCED (GAUZE/BANDAGES/DRESSINGS) ×2
DERMABOND ADVANCED .7 DNX12 (GAUZE/BANDAGES/DRESSINGS) ×1 IMPLANT
DRAPE C-ARM 42X72 X-RAY (DRAPES) ×5 IMPLANT
DRAPE CHEST BREAST 15X10 FENES (DRAPES) ×3 IMPLANT
DRAPE UTILITY XL STRL (DRAPES) ×3 IMPLANT
ELECT CAUTERY BLADE 6.4 (BLADE) ×3 IMPLANT
ELECT REM PT RETURN 9FT ADLT (ELECTROSURGICAL) ×3
ELECTRODE REM PT RTRN 9FT ADLT (ELECTROSURGICAL) ×1 IMPLANT
GAUZE SPONGE 4X4 16PLY XRAY LF (GAUZE/BANDAGES/DRESSINGS) ×3 IMPLANT
GEL ULTRASOUND 20GR AQUASONIC (MISCELLANEOUS) ×3 IMPLANT
GLOVE BIO SURGEON STRL SZ7 (GLOVE) ×3 IMPLANT
GLOVE BIOGEL PI IND STRL 7.5 (GLOVE) ×1 IMPLANT
GLOVE BIOGEL PI INDICATOR 7.5 (GLOVE) ×2
GOWN STRL REUS W/ TWL LRG LVL3 (GOWN DISPOSABLE) ×2 IMPLANT
GOWN STRL REUS W/TWL LRG LVL3 (GOWN DISPOSABLE) ×6
INTRODUCER COOK 11FR (CATHETERS) IMPLANT
KIT BASIN OR (CUSTOM PROCEDURE TRAY) ×3 IMPLANT
KIT PORT POWER 8FR ISP CVUE (Port) ×2 IMPLANT
KIT TURNOVER KIT B (KITS) ×3 IMPLANT
NDL HYPO 25GX1X1/2 BEV (NEEDLE) ×1 IMPLANT
NEEDLE HYPO 25GX1X1/2 BEV (NEEDLE) ×3 IMPLANT
NS IRRIG 1000ML POUR BTL (IV SOLUTION) ×3 IMPLANT
PACK SURGICAL SETUP 50X90 (CUSTOM PROCEDURE TRAY) ×3 IMPLANT
PAD ARMBOARD 7.5X6 YLW CONV (MISCELLANEOUS) ×6 IMPLANT
PENCIL BUTTON HOLSTER BLD 10FT (ELECTRODE) ×5 IMPLANT
SET INTRODUCER 12FR PACEMAKER (INTRODUCER) IMPLANT
SET SHEATH INTRODUCER 10FR (MISCELLANEOUS) IMPLANT
SHEATH COOK PEEL AWAY SET 9F (SHEATH) IMPLANT
SUT MNCRL AB 4-0 PS2 18 (SUTURE) ×3 IMPLANT
SUT PROLENE 2 0 SH DA (SUTURE) ×3 IMPLANT
SUT SILK 2 0 (SUTURE)
SUT SILK 2-0 18XBRD TIE 12 (SUTURE) IMPLANT
SUT VIC AB 3-0 SH 27 (SUTURE) ×3
SUT VIC AB 3-0 SH 27XBRD (SUTURE) ×1 IMPLANT
SYR 20ML ECCENTRIC (SYRINGE) ×6 IMPLANT
SYR 5ML LUER SLIP (SYRINGE) ×3 IMPLANT
SYR CONTROL 10ML LL (SYRINGE) IMPLANT
TOWEL OR 17X26 10 PK STRL BLUE (TOWEL DISPOSABLE) ×3 IMPLANT
TUBE CONNECTING 12'X1/4 (SUCTIONS) ×1
TUBE CONNECTING 12X1/4 (SUCTIONS) ×1 IMPLANT

## 2017-07-30 NOTE — Discharge Instructions (Signed)
    PORT-A-CATH: POST OP INSTRUCTIONS  Always review your discharge instruction sheet given to you by the facility where your surgery was performed.   1. A prescription for pain medication may be given to you upon discharge. Take your pain medication as prescribed, if needed. If narcotic pain medicine is not needed, then you make take acetaminophen (Tylenol) or ibuprofen (Advil) as needed.  2. Take your usually prescribed medications unless otherwise directed. 3. If you need a refill on your pain medication, please contact our office. All narcotic pain medicine now requires a paper prescription.  Phoned in and fax refills are no longer allowed by law.  Prescriptions will not be filled after 5 pm or on weekends.  4. You should follow a light diet for the remainder of the day after your procedure. 5. Most patients will experience some mild swelling and/or bruising in the area of the incision. It may take several days to resolve. 6. It is common to experience some constipation if taking pain medication after surgery. Increasing fluid intake and taking a stool softener (such as Colace) will usually help or prevent this problem from occurring. A mild laxative (Milk of Magnesia or Miralax) should be taken according to package directions if there are no bowel movements after 48 hours.  7. Unless discharge instructions indicate otherwise, you may remove your bandages 48 hours after surgery, and you may shower at that time. You may have steri-strips (small white skin tapes) in place directly over the incision.  These strips should be left on the skin for 7-10 days.  If your surgeon used Dermabond (skin glue) on the incision, you may shower in 24 hours.  The glue will flake off over the next 2-3 weeks.  8. If your port is left accessed at the end of surgery (needle left in port), the dressing cannot get wet and should only by changed by a healthcare professional. When the port is no longer accessed (when the  needle has been removed), follow step 7.   9. ACTIVITIES:  Limit activity involving your arms for the next 72 hours. Do no strenuous exercise or activity for 1 week. You may drive when you are no longer taking prescription pain medication, you can comfortably wear a seatbelt, and you can maneuver your car. 10.You may need to see your doctor in the office for a follow-up appointment.  Please       check with your doctor.  11.When you receive a new Port-a-Cath, you will get a product guide and        ID card.  Please keep them in case you need them.  WHEN TO CALL YOUR DOCTOR ([phone]): 1. Fever over 101.0 2. Chills 3. Continued bleeding from incision 4. Increased redness and tenderness at the site 5. Shortness of breath, difficulty breathing   The clinic staff is available to answer your questions during regular business hours. Please don't hesitate to call and ask to speak to one of the nurses or medical assistants for clinical concerns. If you have a medical emergency, go to the nearest emergency room or call 911.  A surgeon from [facility] is always on call at the hospital.     For further information, please visit [email or web address]      

## 2017-07-30 NOTE — Anesthesia Procedure Notes (Signed)
Procedure Name: LMA Insertion Date/Time: 07/30/2017 9:47 AM Performed by: Lieutenant Diego, CRNA Pre-anesthesia Checklist: Patient identified, Emergency Drugs available, Suction available and Patient being monitored Patient Re-evaluated:Patient Re-evaluated prior to induction Oxygen Delivery Method: Circle system utilized Preoxygenation: Pre-oxygenation with 100% oxygen Induction Type: IV induction Ventilation: Mask ventilation without difficulty LMA: LMA inserted LMA Size: 4.0 Number of attempts: 1 Placement Confirmation: positive ETCO2 and breath sounds checked- equal and bilateral Tube secured with: Tape Dental Injury: Teeth and Oropharynx as per pre-operative assessment

## 2017-07-30 NOTE — Anesthesia Preprocedure Evaluation (Signed)
Anesthesia Evaluation  Patient identified by MRN, date of birth, ID band Patient awake    Reviewed: Allergy & Precautions, NPO status , Patient's Chart, lab work & pertinent test results  History of Anesthesia Complications Negative for: history of anesthetic complications  Airway Mallampati: II  TM Distance: >3 FB Neck ROM: Full    Dental  (+) Teeth Intact   Pulmonary neg pulmonary ROS,    breath sounds clear to auscultation       Cardiovascular negative cardio ROS   Rhythm:Regular     Neuro/Psych  Headaches, PSYCHIATRIC DISORDERS Anxiety Depression    GI/Hepatic Neg liver ROS, GERD  Controlled,  Endo/Other  Morbid obesity  Renal/GU negative Renal ROS     Musculoskeletal   Abdominal   Peds  Hematology negative hematology ROS (+)   Anesthesia Other Findings   Reproductive/Obstetrics                             Anesthesia Physical Anesthesia Plan  ASA: III  Anesthesia Plan: General   Post-op Pain Management:    Induction: Intravenous  PONV Risk Score and Plan: 3 and Ondansetron and Dexamethasone  Airway Management Planned: Oral ETT  Additional Equipment: None  Intra-op Plan:   Post-operative Plan: Extubation in OR  Informed Consent: I have reviewed the patients History and Physical, chart, labs and discussed the procedure including the risks, benefits and alternatives for the proposed anesthesia with the patient or authorized representative who has indicated his/her understanding and acceptance.   Dental advisory given  Plan Discussed with: CRNA and Surgeon  Anesthesia Plan Comments:         Anesthesia Quick Evaluation

## 2017-07-30 NOTE — Interval H&P Note (Signed)
History and Physical Interval Note:  07/30/2017 9:14 AM  Caitlin Cohen  has presented today for surgery, with the diagnosis of BREAST CANCER  The various methods of treatment have been discussed with the patient and family. After consideration of risks, benefits and other options for treatment, the patient has consented to  Procedure(s): INSERTION PORT-A-CATH WITH [place] (N/A) as a surgical intervention .  The patient's history has been reviewed, patient examined, no change in status, stable for surgery.  I have reviewed the patient's chart and labs.  Questions were answered to the patient's satisfaction.     [name]

## 2017-07-30 NOTE — Op Note (Signed)
Preoperative diagnosis:breast cancer, high oncotype Postoperative diagnosis: same as above Procedure: right ij US guided powerport insertion Surgeon: Dr Serita Grammes EBL: minimal Anes: general  Specimens none Complications none Drains none Sponge count correct Dispo to pacu stable  Indications: This is a61 yof who has undergone lumpectomy/sn.  She has high oncotype.  She is indicated for chemotherapy.We discussed port placement.  Procedure: After informed consent was obtained the patient was taken to the operating room. She was given antibiotics. Sequential compression devices were on her legs. She was then placed under general anesthesia with an LMA. Then she was prepped and draped in the standard sterile surgical fashion. Surgical timeout was then performed.  I used the ultrasound to identify the right internal jugular vein.I then accessed the vein using the ultrasound.This aspirated blood. I then placed the wire. The wirewas confirmed by fluoroscopy and ultrasound to be in the correct position.I tunneled the line between the 2 sites.I then dilated the tract and placed the dilator assembly with the sheath. This was done under fluoroscopy. I then removed the sheath and dilator. The wire was also removed. The line was then pulled back to be in the venacava. I hooked this up to the port. I sutured this into place with 2-0 Prolene in 2 places. This aspirated blood and flushed easily.This was confirmed with a final fluoroscopy. I then closed this with 2-0 Vicryl and 4-0 Monocryl. This withdrew blood and I placed heparin in it. Dermabond was placed on both the incisions.A dressing was placed. She tolerated this well and was transferred to the recovery room in stable condition

## 2017-07-30 NOTE — Transfer of Care (Signed)
Immediate Anesthesia Transfer of Care Note  Patient: Caitlin Cohen  Procedure(s) Performed: INSERTION PORT-A-CATH WITH [place] (N/A Chest)  Patient Location: PACU  Anesthesia Type:General  Level of Consciousness: awake  Airway & Oxygen Therapy: Patient Spontanous Breathing and Patient connected to face mask oxygen  Post-op Assessment: Report given to RN and Post -op Vital signs reviewed and stable  Post vital signs: Reviewed and stable  Last Vitals:  Vitals Value Taken Time  BP 138/87 07/30/2017 10:47 AM  Temp 36.5 C 07/30/2017 10:45 AM  Pulse 90 07/30/2017 10:47 AM  Resp 15 07/30/2017 10:47 AM  SpO2 100 % 07/30/2017 10:47 AM  Vitals shown include unvalidated device data.  Last Pain:  Vitals:   07/30/17 0829  TempSrc:   PainSc: 0-No pain      Patients Stated Pain Goal: 3 (29/57/47 3403)  Complications: No apparent anesthesia complications

## 2017-07-31 ENCOUNTER — Encounter (HOSPITAL_COMMUNITY): Payer: Self-pay | Admitting: General Surgery

## 2017-07-31 ENCOUNTER — Encounter (HOSPITAL_COMMUNITY): Payer: Self-pay | Admitting: Hematology

## 2017-08-01 ENCOUNTER — Inpatient Hospital Stay: Payer: BLUE CROSS/BLUE SHIELD

## 2017-08-01 NOTE — Anesthesia Postprocedure Evaluation (Signed)
Anesthesia Post Note  Patient: Caitlin Cohen  Procedure(s) Performed: INSERTION PORT-A-CATH WITH [place] (N/A Chest)     Patient location during evaluation: PACU Anesthesia Type: General Level of consciousness: awake and alert Pain management: pain level controlled Vital Signs Assessment: post-procedure vital signs reviewed and stable Respiratory status: spontaneous breathing, nonlabored ventilation, respiratory function stable and patient connected to nasal cannula oxygen Cardiovascular status: blood pressure returned to baseline and stable Postop Assessment: no apparent nausea or vomiting Anesthetic complications: no    Last Vitals:  Vitals:   07/30/17 1130 07/30/17 1200  BP: 130/88 136/87  Pulse: 81 84  Resp: 14 16  Temp: (!) 36 C   SpO2: 97% 96%    Last Pain:  Vitals:   07/30/17 1122  TempSrc:   PainSc: 3                  Isyss Espinal

## 2017-08-05 ENCOUNTER — Other Ambulatory Visit: Payer: Self-pay | Admitting: Hematology

## 2017-08-05 ENCOUNTER — Encounter: Payer: Self-pay | Admitting: Nurse Practitioner

## 2017-08-05 DIAGNOSIS — Z170 Estrogen receptor positive status [ER+]: Principal | ICD-10-CM

## 2017-08-05 DIAGNOSIS — C50211 Malignant neoplasm of upper-inner quadrant of right female breast: Secondary | ICD-10-CM

## 2017-08-05 MED ORDER — ONDANSETRON HCL 8 MG PO TABS: 8.0000 mg | ORAL_TABLET | Freq: Three times a day (TID) | ORAL | 2 refills | Status: DC | PRN

## 2017-08-05 MED ORDER — PROCHLORPERAZINE MALEATE 10 MG PO TABS: 10.0000 mg | ORAL_TABLET | Freq: Four times a day (QID) | ORAL | 2 refills | Status: DC | PRN

## 2017-08-05 MED ORDER — LIDOCAINE-PRILOCAINE 2.5-2.5 % EX CREA: TOPICAL_CREAM | CUTANEOUS | 3 refills | Status: DC

## 2017-08-05 NOTE — Progress Notes (Signed)
Called patient to introduce myself as Arboriculturist. Asked if she has any financial questions or concerns regarding treatment. She states not at this time. Patient confirmed she has Medicaid therefore no copay assistance needed or available. Discussed one-time $1000 Product/process development scientist. Asked patient if she can bring proof of income on 08/08/17 to apply for grant. She states she can. Will see patient after infusion. She verbalized understanding.

## 2017-08-07 ENCOUNTER — Other Ambulatory Visit: Payer: Self-pay | Admitting: *Deleted

## 2017-08-07 ENCOUNTER — Other Ambulatory Visit: Payer: Self-pay

## 2017-08-07 ENCOUNTER — Telehealth: Payer: Self-pay

## 2017-08-07 DIAGNOSIS — Z170 Estrogen receptor positive status [ER+]: Principal | ICD-10-CM

## 2017-08-07 DIAGNOSIS — C50211 Malignant neoplasm of upper-inner quadrant of right female breast: Secondary | ICD-10-CM

## 2017-08-07 MED ORDER — DEXAMETHASONE 4 MG PO TABS: 4.0000 mg | ORAL_TABLET | Freq: Two times a day (BID) | ORAL | 0 refills | Status: DC

## 2017-08-07 NOTE — Telephone Encounter (Signed)
Patient called regarding pre-medication as getting first chemo treatment tomorrow.    Per Dr. Learta Codding prescribed Decadron 8 mg day before chemo and day after chemo.  Patient was notified how to take medication and verbalized an understanding.

## 2017-08-08 ENCOUNTER — Telehealth: Payer: Self-pay | Admitting: Adult Health

## 2017-08-08 ENCOUNTER — Encounter: Payer: Self-pay | Admitting: Adult Health

## 2017-08-08 ENCOUNTER — Inpatient Hospital Stay (HOSPITAL_BASED_OUTPATIENT_CLINIC_OR_DEPARTMENT_OTHER): Payer: BLUE CROSS/BLUE SHIELD | Admitting: Adult Health

## 2017-08-08 ENCOUNTER — Encounter: Payer: Self-pay | Admitting: Hematology

## 2017-08-08 ENCOUNTER — Encounter: Payer: Self-pay | Admitting: *Deleted

## 2017-08-08 ENCOUNTER — Inpatient Hospital Stay: Payer: BLUE CROSS/BLUE SHIELD

## 2017-08-08 VITALS — BP 142/87 | HR 100 | Temp 98.2°F | Resp 18

## 2017-08-08 VITALS — BP 158/98 | HR 113 | Temp 98.0°F | Resp 18 | Ht 67.0 in | Wt 344.2 lb

## 2017-08-08 DIAGNOSIS — C50211 Malignant neoplasm of upper-inner quadrant of right female breast: Secondary | ICD-10-CM

## 2017-08-08 DIAGNOSIS — Z170 Estrogen receptor positive status [ER+]: Principal | ICD-10-CM

## 2017-08-08 LAB — CMP (CANCER CENTER ONLY)
ALT: 19 U/L (ref 0–55)
AST: 18 U/L (ref 5–34)
Albumin: 4 g/dL (ref 3.5–5.0)
Alkaline Phosphatase: 94 U/L (ref 40–150)
Anion gap: 9 (ref 3–11)
BUN: 15 mg/dL (ref 7–26)
CHLORIDE: 107 mmol/L (ref 98–109)
CO2: 20 mmol/L — AB (ref 22–29)
CREATININE: 0.91 mg/dL (ref 0.60–1.10)
Calcium: 10.1 mg/dL (ref 8.4–10.4)
GFR, Estimated: >60 mL/min (ref >60)
Glucose, Bld: 216 mg/dL — ABNORMAL HIGH (ref 70–140)
POTASSIUM: 4.2 mmol/L (ref 3.5–5.1)
SODIUM: 136 mmol/L (ref 136–145)
Total Bilirubin: 0.4 mg/dL (ref 0.2–1.2)
Total Protein: 8.7 g/dL — ABNORMAL HIGH (ref 6.4–8.3)

## 2017-08-08 LAB — CBC WITH DIFFERENTIAL (CANCER CENTER ONLY)
Basophils Absolute: 0.1 10*3/uL (ref 0.0–0.1)
Basophils Relative: 1 %
EOS ABS: 0 10*3/uL (ref 0.0–0.5)
Eosinophils Relative: 0 %
HEMATOCRIT: 42.4 % (ref 34.8–46.6)
HEMOGLOBIN: 13.8 g/dL (ref 11.6–15.9)
LYMPHS ABS: 1.8 10*3/uL (ref 0.9–3.3)
LYMPHS PCT: 18 %
MCH: 26.9 pg (ref 25.1–34.0)
MCHC: 32.4 g/dL (ref 31.5–36.0)
MCV: 83 fL (ref 79.5–101.0)
MONOS PCT: 1 %
Monocytes Absolute: 0.1 10*3/uL (ref 0.1–0.9)
NEUTROS PCT: 80 %
Neutro Abs: 7.9 10*3/uL — ABNORMAL HIGH (ref 1.5–6.5)
Platelet Count: 308 10*3/uL (ref 145–400)
RBC: 5.11 MIL/uL (ref 3.70–5.45)
RDW: 14.8 % — ABNORMAL HIGH (ref 11.2–14.5)
WBC Count: 9.8 10*3/uL (ref 3.9–10.3)

## 2017-08-08 MED ORDER — HEPARIN SOD (PORK) LOCK FLUSH 100 UNIT/ML IV SOLN
500.0000 [IU] | Freq: Once | INTRAVENOUS | Status: AC | PRN
  Administered 2017-08-08: 500 [IU]
  Filled 2017-08-08: qty 5

## 2017-08-08 MED ORDER — PALONOSETRON HCL INJECTION 0.25 MG/5ML
0.2500 mg | Freq: Once | INTRAVENOUS | Status: AC
  Administered 2017-08-08: 0.25 mg via INTRAVENOUS

## 2017-08-08 MED ORDER — SODIUM CHLORIDE 0.9% FLUSH
10.0000 mL | INTRAVENOUS | Status: DC | PRN
  Administered 2017-08-08: 10 mL
  Filled 2017-08-08: qty 10

## 2017-08-08 MED ORDER — DOCETAXEL CHEMO INJECTION 160 MG/16ML: 75.0000 mg/m2 | Freq: Once | INTRAVENOUS | Status: DC

## 2017-08-08 MED ORDER — DEXAMETHASONE SODIUM PHOSPHATE 10 MG/ML IJ SOLN
10.0000 mg | Freq: Once | INTRAMUSCULAR | Status: AC
  Administered 2017-08-08: 10 mg via INTRAVENOUS

## 2017-08-08 MED ORDER — SODIUM CHLORIDE 0.9 % IV SOLN
Freq: Once | INTRAVENOUS | Status: AC
  Administered 2017-08-08: 10:00:00 via INTRAVENOUS

## 2017-08-08 MED ORDER — SODIUM CHLORIDE 0.9 % IV SOLN
600.0000 mg/m2 | Freq: Once | INTRAVENOUS | Status: AC
  Administered 2017-08-08: 1640 mg via INTRAVENOUS
  Filled 2017-08-08: qty 82

## 2017-08-08 MED ORDER — SODIUM CHLORIDE 0.9 % IV SOLN
75.0000 mg/m2 | Freq: Once | INTRAVENOUS | Status: AC
  Administered 2017-08-08: 210 mg via INTRAVENOUS
  Filled 2017-08-08: qty 21

## 2017-08-08 MED ORDER — PALONOSETRON HCL INJECTION 0.25 MG/5ML
INTRAVENOUS | Status: AC
  Filled 2017-08-08: qty 5

## 2017-08-08 MED ORDER — DEXAMETHASONE SODIUM PHOSPHATE 10 MG/ML IJ SOLN
INTRAMUSCULAR | Status: AC
  Filled 2017-08-08: qty 1

## 2017-08-08 NOTE — Progress Notes (Signed)
Pt arrived to infusion room with cream on port and saran wrap. Medial aspect of port incision appears slightly open. No drainage noted. Pt denies any pain or tenderness at site. Pt instructed to monitor site and call Dr. Donne Hazel if she notices any drainage, redness or tenderness in the area.

## 2017-08-08 NOTE — Assessment & Plan Note (Signed)
Caitlin Cohen is a pleasant 52 year old woman with stage IB right breast invasive ductal carcinoma, ER/PR positive, HER-2 negative, with an oncotype score of 29, here today for adjuvant chemotherapy with Taxotere and Cytoxan.    Caitlin Cohen is doing well today.  Her CBC is stable.  We reviewed her chemotherapy in detail.  We reviewed her anti emetics in detail.  We reviewed Neulasta and its role in her treatment plan.  We discussed taking Claritin for her bone pain.  She will proceed with her first cycle of adjuvant chemotherapy today with Taxotere and Cytoxan.    Caitlin Cohen will return hopefully tomorrow afternoon for her Neulasta, if she finishes chemotherapy too late today, then she may have to wait until Monday to receive it.  She will then see [name], NP next week for labs and follow up.

## 2017-08-08 NOTE — Telephone Encounter (Signed)
Gave avs and calendar ° °

## 2017-08-08 NOTE — Progress Notes (Signed)
Met with patient whom brought proof of income for [employer]. Patient approved for one-time $1000 [employer]. Gave her a copy of the expense sheet as well as outpatient pharmacy information. Explained to her how expenses are covered. Patient verbalized understanding. She received a gas card today. She has my card for any additional financial questions or concerns.

## 2017-08-08 NOTE — Patient Instructions (Signed)
[facility] Discharge Instructions for Patients Receiving Chemotherapy  Today you received the following chemotherapy agents: Taxotere and Cytoxan. Remember to take your Dexamethasone (Decadron) one tablet twice on 4/27.  To help prevent nausea and vomiting after your treatment, we encourage you to take your nausea medication: Compazine. Take one every 6 hours as needed. For nausea on or after 4/29, you may also take Zofran every 8 hours as needed.  If you develop nausea and vomiting that is not controlled by your nausea medication, call the clinic.   BELOW ARE SYMPTOMS THAT SHOULD BE REPORTED IMMEDIATELY:  *FEVER GREATER THAN 100.5 F  *CHILLS WITH OR WITHOUT FEVER  NAUSEA AND VOMITING THAT IS NOT CONTROLLED WITH YOUR NAUSEA MEDICATION  *UNUSUAL SHORTNESS OF BREATH  *UNUSUAL BRUISING OR BLEEDING  TENDERNESS IN MOUTH AND THROAT WITH OR WITHOUT PRESENCE OF ULCERS  *URINARY PROBLEMS  *BOWEL PROBLEMS  UNUSUAL RASH Items with * indicate a potential emergency and should be followed up as soon as possible.  Feel free to call the clinic should you have any questions or concerns. The clinic phone number is [phone].  Please show the [facility] at check-in to the Emergency Department and triage nurse.

## 2017-08-08 NOTE — Progress Notes (Signed)
[facility] Follow up:    Caitlin Cohen, [address] [address]   DIAGNOSIS: Cancer Staging Malignant neoplasm of upper-inner quadrant of right breast in female, estrogen receptor positive ([place]) Staging form: Breast, AJCC 8th Edition - Clinical stage from 06/26/2017: Stage IIA (cT2, cN0, cM0, G3, ER: Positive, PR: Positive, HER2: Negative) - Signed by Truitt Merle, MD on 07/01/2017 - Pathologic stage from 07/08/2017: Stage IB (pT2, pN0, cM0, G3, ER+, PR+, HER2-, Oncotype DX score: 29) - Signed by [name], NP on 07/25/2017   SUMMARY OF ONCOLOGIC HISTORY: Oncology History   Cancer Staging Malignant neoplasm of upper-inner quadrant of right breast in female, estrogen receptor positive ([place]) Staging form: Breast, AJCC 8th Edition - Clinical stage from 06/26/2017: Stage IIA (cT2, cN0, cM0, G3, ER: Positive, PR: Positive, HER2: Negative) - Signed by Truitt Merle, MD on 07/01/2017 - Pathologic stage from 07/08/2017: Stage IB (pT2, pN0, cM0, G3, ER+, PR+, HER2-, Oncotype DX score: 29) - Signed by [name], NP on 07/25/2017       Malignant neoplasm of upper-inner quadrant of right breast in female, estrogen receptor positive ([place] [facility])   06/23/2017 Mammogram    IMPRESSION: 1. There is a highly suspicious mass measuring 3.5 cm mammographically in the right breast at the palpable site identified by the patient. 2. There is 1 suspicious lymph node with cortex measuring up to 6 mm. 3.  No mammographic evidence of malignancy in the left breast.      06/26/2017 Initial Biopsy    Diagnosis 06/26/17 1. Breast, right, needle core biopsy, upper inner quadrant, 12:30 o'clock, 7cm from nipple - INVASIVE DUCTAL CARCINOMA - SEE COMMENT 2. Lymph node, needle/core biopsy, right axillary (level 2 node) - NO CARCINOMA IDENTIFIED IN ONE LYMPH NODE (0/1)      06/26/2017 Receptors her2    Prognostic indicators significant for: ER, 70% positive and PR, 70% positive,  both with strong staining intensity. Proliferation marker Ki67 at 60%. HER2 negative.      07/01/2017 Initial Diagnosis    Malignant neoplasm of upper-inner quadrant of right breast in female, estrogen receptor positive ([facility])      07/08/2017 Pathology Results    Diagnosis 1. Breast, lumpectomy, Right - INVASIVE DUCTAL CARCINOMA, NOTTINGHAM GRADE 3 OF 3, 3.5 CM - MARGINS UNINVOLVED BY CARCINOMA (0.1 CM, SUPERIOR MARGIN) - PREVIOUS BIOPSY SITE CHANGES - SEE ONCOLOGY TABLE BELOW 2. Soft tissue, biopsy, Axillary - BENIGN FIBROADIPOSE TISSUE - NO MALIGNANCY IDENTIFIED 3. Lymph node, sentinel, biopsy, Right axillary - NO CARCINOMA IDENTIFIED IN ONE LYMPH NODE (0/1) 4. Lymph node, sentinel, biopsy, Right axillary - NO CARCINOMA IDENTIFIED IN ONE LYMPH NODE (0/1) - PREVIOUS BIOPSY SITE CHANGES - SEE COMMENT 5. Breast, excision, Right superior margin - BENIGN BREAST TISSUE - NO RESIDUAL CARCINOMA IDENTIFIED 6. Breast, excision, Right inferior margin - BENIGN BREAST TISSUE - NO RESIDUAL CARCINOMA IDENTIFIED      07/08/2017 Cancer Staging    Staging form: Breast, AJCC 8th Edition - Pathologic stage from 07/08/2017: Stage IB (pT2, pN0, cM0, G3, ER+, PR+, HER2-, Oncotype DX score: 29) - Signed by [name], NP on 07/25/2017      07/22/2017 Oncotype testing    Recurrence Score: 29 Distant Recurrence Risk at 9 years with Ai or Tamoxifen alone: 18% Absolute Chemotherapy Benefit: > 15 %      08/07/2017 -  Chemotherapy    The patient had palonosetron (ALOXI) injection 0.25 mg, 0.25 mg, Intravenous,  Once, 0 of 4 cycles pegfilgrastim-cbqv (UDENYCA) injection 6 mg, 6 mg, Subcutaneous, Once, 0 of 4 cycles cyclophosphamide (CYTOXAN) 1,640 mg in sodium chloride 0.9 % 250 mL chemo infusion, 600 mg/m2 = 1,640 mg, Intravenous,  Once, 0 of 4 cycles DOCEtaxel (TAXOTERE) 210 mg in dextrose 5 % 500 mL chemo infusion, 75 mg/m2 = 210 mg, Intravenous,  Once, 0 of 4 cycles  for chemotherapy treatment.         CURRENT THERAPY: Taxotere/Cytoxan  INTERVAL HISTORY: Caitlin Cohen 52 y.o. female returns for evaluation prior to receiving her first dose of adjuvant chemotherapy with Taxotere and Cytoxan.  She is doing well today.  She underwent port placement and tolerated it well.  She denies any issues today.     Patient Active Problem List   Diagnosis Date Noted  . Breast cancer, left ([place]) 07/08/2017  . Malignant neoplasm of upper-inner quadrant of right breast in female, estrogen receptor positive ([place]) 07/01/2017  . Mood disorder ([place]) 07/05/2013    Class: Acute    has No Known Allergies.  MEDICAL HISTORY: Past Medical History:  Diagnosis Date  . Anemia   . Anxiety   . Cancer [facility])    breast cancer right  . Depression   . GERD (gastroesophageal reflux disease)   . Headache     SURGICAL HISTORY: Past Surgical History:  Procedure Laterality Date  . CERVICAL CONE BIOPSY    . CHOLECYSTECTOMY    . MASTECTOMY WITH RADIOACTIVE SEED GUIDED EXCISION AND AXILLARY SENTINEL LYMPH NODE BIOPSY Right 07/08/2017   Procedure: RIGHT LUMPTECTOMY WITH RADIOACTIVE SEED GUIDED EXCISION AND RIGHT AXILLARY SENTINEL LYMPH NODE BIOPSY ERAS PATHWAY;  Surgeon: [name], MD;  Location: [facility];  Service: General;  Laterality: Right;  PEC BLOCK  . OPEN REDUCTION INTERNAL FIXATION (ORIF) DISTAL RADIAL FRACTURE  03/17/2012   Procedure: OPEN REDUCTION INTERNAL FIXATION (ORIF) DISTAL RADIAL FRACTURE;  Surgeon: Roseanne Kaufman, MD;  Location: [facility];  Service: Orthopedics;  Laterality: Right;  Pre-operative a supra-clavical block right arm in addition to general anesthesia  . PORTACATH PLACEMENT N/A 07/30/2017   Procedure: INSERTION PORT-A-CATH WITH [place];  Surgeon: [name], MD;  Location: [facility];  Service: General;  Laterality: N/A;  . TUBAL LIGATION      SOCIAL HISTORY: Social History   Socioeconomic History  . Marital status: Single    Spouse name: Not on file  . Number of children: Not  on file  . Years of education: Not on file  . Highest education level: Not on file  Occupational History  . Not on file  Social Needs  . Financial resource strain: Not on file  . Food insecurity:    Worry: Not on file    Inability: Not on file  . Transportation needs:    Medical: Not on file    Non-medical: Not on file  Tobacco Use  . Smoking status: Never Smoker  . Smokeless tobacco: Never Used  Substance and Sexual Activity  . Alcohol use: Yes    Comment: occassionally  . Drug use: No  . Sexual activity: Yes    Birth control/protection: Surgical  Lifestyle  . Physical activity:    Days per week: 2 days    Minutes per session: 30 min  . Stress: Only a little  Relationships  . Social connections:    Talks on phone: More than three times a week    Gets together: Once a week    Attends religious service: Never  Active member of club or organization: No    Attends meetings of clubs or organizations: Never    Relationship status: Never married  . Intimate partner violence:    Fear of current or ex partner: No    Emotionally abused: Yes    Physically abused: No    Forced sexual activity: No  Other Topics Concern  . Not on file  Social History Narrative   Lives with roommate   Works fulltime but seasonal work    FAMILY HISTORY: Family History  Problem Relation Age of Onset  . Hypertension Mother   . Diabetes Father   . Hypertension Father     Review of Systems  Constitutional: Negative for appetite change, chills, fatigue, fever and unexpected weight change.  HENT:   Negative for hearing loss, lump/mass, sore throat, tinnitus, trouble swallowing and voice change.   Eyes: Negative for eye problems and icterus.  Respiratory: Negative for chest tightness, cough and shortness of breath.   Cardiovascular: Negative for chest pain, leg swelling and palpitations.  Gastrointestinal: Negative for abdominal distention, abdominal pain, constipation, diarrhea, nausea and  vomiting.  Endocrine: Negative for hot flashes.  Skin: Negative for itching and rash.  Neurological: Negative for dizziness, extremity weakness, headaches and numbness.  Hematological: Negative for adenopathy. Does not bruise/bleed easily.  Psychiatric/Behavioral: Negative for depression. The patient is not nervous/anxious.       PHYSICAL EXAMINATION  ECOG PERFORMANCE STATUS: 0 - Asymptomatic  Vitals:   08/08/17 0805  BP: (!) 158/98  Pulse: (!) 113  Resp: 18  Temp: 98 F (36.7 C)  SpO2: 96%    Physical Exam  Constitutional: She is oriented to person, place, and time and well-developed, well-nourished, and in no distress.  HENT:  Head: Normocephalic and atraumatic.  Mouth/Throat: Oropharynx is clear and moist. No oropharyngeal exudate.  Eyes: Pupils are equal, round, and reactive to light. No scleral icterus.  Neck: Neck supple.  Cardiovascular: Normal rate, regular rhythm and normal heart sounds.  HR 98 on recheck   Pulmonary/Chest: Effort normal and breath sounds normal.  Abdominal: Soft. Bowel sounds are normal. She exhibits no distension and no mass. There is no tenderness. There is no rebound and no guarding.  Musculoskeletal: She exhibits no edema.  Lymphadenopathy:    She has no cervical adenopathy.  Neurological: She is alert and oriented to person, place, and time.  Skin: Skin is warm and dry. No rash noted.  Psychiatric: Mood and affect normal.    LABORATORY DATA:  CBC    Component Value Date/Time   WBC 9.8 08/08/2017 0742   WBC 8.5 07/30/2017 0805   RBC 5.11 08/08/2017 0742   HGB 13.8 08/08/2017 0742   HCT 42.4 08/08/2017 0742   PLT 308 08/08/2017 0742   MCV 83.0 08/08/2017 0742   MCH 26.9 08/08/2017 0742   MCHC 32.4 08/08/2017 0742   RDW 14.8 (H) 08/08/2017 0742   LYMPHSABS 1.8 08/08/2017 0742   MONOABS 0.1 08/08/2017 0742   EOSABS 0.0 08/08/2017 0742   BASOSABS 0.1 08/08/2017 0742    CMP     Component Value Date/Time   NA 136 08/08/2017  0742   K 4.2 08/08/2017 0742   CL 107 08/08/2017 0742   CO2 20 (L) 08/08/2017 0742   GLUCOSE 216 (H) 08/08/2017 0742   BUN 15 08/08/2017 0742   CREATININE 0.91 08/08/2017 0742   CALCIUM 10.1 08/08/2017 0742   PROT 8.7 (H) 08/08/2017 0742   ALBUMIN 4.0 08/08/2017 0742   AST  18 08/08/2017 0742   ALT 19 08/08/2017 0742   ALKPHOS 94 08/08/2017 0742   BILITOT 0.4 08/08/2017 0742   GFRNONAA >60 08/08/2017 0742   GFRAA >60 08/08/2017 0742      ASSESSMENT and THERAPY PLAN:   Malignant neoplasm of upper-inner quadrant of right breast in female, estrogen receptor positive ([facility]) Caitlin Cohen is a pleasant 52 year old woman with stage IB right breast invasive ductal carcinoma, ER/PR positive, HER-2 negative, with an oncotype score of 29, here today for adjuvant chemotherapy with Taxotere and Cytoxan.    Caitlin Cohen is doing well today.  Her CBC is stable.  We reviewed her chemotherapy in detail.  We reviewed her anti emetics in detail.  We reviewed Neulasta and its role in her treatment plan.  We discussed taking Claritin for her bone pain.  She will proceed with her first cycle of adjuvant chemotherapy today with Taxotere and Cytoxan.    Caitlin Cohen will return hopefully tomorrow afternoon for her Neulasta, if she finishes chemotherapy too late today, then she may have to wait until Monday to receive it.  She will then see [name], NP next week for labs and follow up.      All questions were answered. The patient knows to call the clinic with any problems, questions or concerns. We can certainly see the patient much sooner if necessary.  A total of (30) minutes of face-to-face time was spent with this patient with greater than 50% of that time in counseling and care-coordination.  This note was electronically signed. Scot Dock, NP 08/08/2017

## 2017-08-09 ENCOUNTER — Inpatient Hospital Stay: Payer: BLUE CROSS/BLUE SHIELD

## 2017-08-09 VITALS — BP 131/91 | HR 95 | Temp 98.7°F | Resp 18

## 2017-08-09 DIAGNOSIS — Z170 Estrogen receptor positive status [ER+]: Principal | ICD-10-CM

## 2017-08-09 DIAGNOSIS — C50211 Malignant neoplasm of upper-inner quadrant of right female breast: Secondary | ICD-10-CM

## 2017-08-09 MED ORDER — PEGFILGRASTIM-CBQV 6 MG/0.6ML ~~LOC~~ SOSY
6.0000 mg | PREFILLED_SYRINGE | Freq: Once | SUBCUTANEOUS | Status: AC
  Administered 2017-08-09: 6 mg via SUBCUTANEOUS

## 2017-08-09 MED ORDER — PEGFILGRASTIM-CBQV 6 MG/0.6ML ~~LOC~~ SOSY
PREFILLED_SYRINGE | SUBCUTANEOUS | Status: AC
  Filled 2017-08-09: qty 0.6

## 2017-08-09 NOTE — Patient Instructions (Signed)
Pegfilgrastim injection What is this medicine? PEGFILGRASTIM (PEG fil gra stim) is a long-acting granulocyte colony-stimulating factor that stimulates the growth of neutrophils, a type of white blood cell important in the body's fight against infection. It is used to reduce the incidence of fever and infection in patients with certain types of cancer who are receiving chemotherapy that affects the bone marrow, and to increase survival after being exposed to high doses of radiation. This medicine may be used for other purposes; ask your health care provider or pharmacist if you have questions. COMMON BRAND NAME(S): Neulasta What should I tell my health care provider before I take this medicine? They need to know if you have any of these conditions: -kidney disease -latex allergy -ongoing radiation therapy -sickle cell disease -skin reactions to acrylic adhesives (On-Body Injector only) -an unusual or allergic reaction to pegfilgrastim, filgrastim, other medicines, foods, dyes, or preservatives -pregnant or trying to get pregnant -breast-feeding How should I use this medicine? This medicine is for injection under the skin. If you get this medicine at home, you will be taught how to prepare and give the pre-filled syringe or how to use the On-body Injector. Refer to the patient Instructions for Use for detailed instructions. Use exactly as directed. Tell your healthcare provider immediately if you suspect that the On-body Injector may not have performed as intended or if you suspect the use of the On-body Injector resulted in a missed or partial dose. It is important that you put your used needles and syringes in a special sharps container. Do not put them in a trash can. If you do not have a sharps container, call your pharmacist or healthcare provider to get one. Talk to your pediatrician regarding the use of this medicine in children. While this drug may be prescribed for selected conditions,  precautions do apply. Overdosage: If you think you have taken too much of this medicine contact a poison control center or emergency room at once. NOTE: This medicine is only for you. Do not share this medicine with others. What if I miss a dose? It is important not to miss your dose. Call your doctor or health care professional if you miss your dose. If you miss a dose due to an On-body Injector failure or leakage, a new dose should be administered as soon as possible using a single prefilled syringe for manual use. What may interact with this medicine? Interactions have not been studied. Give your health care provider a list of all the medicines, herbs, non-prescription drugs, or dietary supplements you use. Also tell them if you smoke, drink alcohol, or use illegal drugs. Some items may interact with your medicine. This list may not describe all possible interactions. Give your health care provider a list of all the medicines, herbs, non-prescription drugs, or dietary supplements you use. Also tell them if you smoke, drink alcohol, or use illegal drugs. Some items may interact with your medicine. What should I watch for while using this medicine? You may need blood work done while you are taking this medicine. If you are going to need a MRI, CT scan, or other procedure, tell your doctor that you are using this medicine (On-Body Injector only). What side effects may I notice from receiving this medicine? Side effects that you should report to your doctor or health care professional as soon as possible: -allergic reactions like skin rash, itching or hives, swelling of the face, lips, or tongue -dizziness -fever -pain, redness, or irritation at site   where injected -pinpoint red spots on the skin -red or dark-brown urine -shortness of breath or breathing problems -stomach or side pain, or pain at the shoulder -swelling -tiredness -trouble passing urine or change in the amount of urine Side  effects that usually do not require medical attention (report to your doctor or health care professional if they continue or are bothersome): -bone pain -muscle pain This list may not describe all possible side effects. Call your doctor for medical advice about side effects. You may report side effects to FDA at 1-800-FDA-1088. Where should I keep my medicine? Keep out of the reach of children. Store pre-filled syringes in a refrigerator between 2 and 8 degrees C (36 and 46 degrees F). Do not freeze. Keep in carton to protect from light. Throw away this medicine if it is left out of the refrigerator for more than 48 hours. Throw away any unused medicine after the expiration date. NOTE: This sheet is a summary. It may not cover all possible information. If you have questions about this medicine, talk to your doctor, pharmacist, or health care provider.  2018 Elsevier/Gold Standard (2016-03-28 12:58:03)  

## 2017-08-11 ENCOUNTER — Telehealth: Payer: Self-pay

## 2017-08-11 NOTE — Telephone Encounter (Addendum)
Patient calls with complaint of bilateral lower extremities since yesterday afternoon. Denies swelling, redness, or warmth.  No relief with Advil.    Informed patient this is a side effect of the injection she received on Saturday. Taking Claritin but took it the day of injection and has continued to take.  Instructed patient to take the day before the next injection, the day of and day after.  Patient verbalized an understanding and has appointment on 08/14/17 with Dr. Burr Medico.

## 2017-08-14 ENCOUNTER — Other Ambulatory Visit: Payer: Self-pay

## 2017-08-14 ENCOUNTER — Telehealth: Payer: Self-pay | Admitting: Nurse Practitioner

## 2017-08-14 ENCOUNTER — Encounter: Payer: Self-pay | Admitting: Nurse Practitioner

## 2017-08-14 ENCOUNTER — Inpatient Hospital Stay: Payer: BLUE CROSS/BLUE SHIELD

## 2017-08-14 ENCOUNTER — Encounter: Payer: Self-pay | Admitting: Physical Therapy

## 2017-08-14 ENCOUNTER — Other Ambulatory Visit: Payer: Self-pay | Admitting: Pharmacist

## 2017-08-14 ENCOUNTER — Inpatient Hospital Stay: Payer: BLUE CROSS/BLUE SHIELD | Attending: Hematology

## 2017-08-14 ENCOUNTER — Inpatient Hospital Stay (HOSPITAL_BASED_OUTPATIENT_CLINIC_OR_DEPARTMENT_OTHER): Payer: BLUE CROSS/BLUE SHIELD | Admitting: Nurse Practitioner

## 2017-08-14 ENCOUNTER — Ambulatory Visit: Payer: BLUE CROSS/BLUE SHIELD | Attending: General Surgery | Admitting: Physical Therapy

## 2017-08-14 VITALS — BP 117/69 | HR 102 | Temp 98.9°F | Resp 20

## 2017-08-14 VITALS — BP 135/81 | HR 111 | Temp 98.6°F | Resp 22 | Ht 67.0 in | Wt 339.9 lb

## 2017-08-14 DIAGNOSIS — E876 Hypokalemia: Secondary | ICD-10-CM

## 2017-08-14 DIAGNOSIS — Z5111 Encounter for antineoplastic chemotherapy: Secondary | ICD-10-CM | POA: Insufficient documentation

## 2017-08-14 DIAGNOSIS — R530 Neoplastic (malignant) related fatigue: Secondary | ICD-10-CM

## 2017-08-14 DIAGNOSIS — Z170 Estrogen receptor positive status [ER+]: Principal | ICD-10-CM

## 2017-08-14 DIAGNOSIS — C50211 Malignant neoplasm of upper-inner quadrant of right female breast: Secondary | ICD-10-CM | POA: Insufficient documentation

## 2017-08-14 DIAGNOSIS — R634 Abnormal weight loss: Secondary | ICD-10-CM | POA: Diagnosis not present

## 2017-08-14 DIAGNOSIS — R293 Abnormal posture: Secondary | ICD-10-CM | POA: Diagnosis present

## 2017-08-14 DIAGNOSIS — M898X9 Other specified disorders of bone, unspecified site: Secondary | ICD-10-CM

## 2017-08-14 DIAGNOSIS — C50411 Malignant neoplasm of upper-outer quadrant of right female breast: Secondary | ICD-10-CM | POA: Diagnosis present

## 2017-08-14 DIAGNOSIS — Z5189 Encounter for other specified aftercare: Secondary | ICD-10-CM | POA: Insufficient documentation

## 2017-08-14 DIAGNOSIS — R197 Diarrhea, unspecified: Secondary | ICD-10-CM

## 2017-08-14 DIAGNOSIS — E860 Dehydration: Secondary | ICD-10-CM | POA: Diagnosis not present

## 2017-08-14 DIAGNOSIS — Z483 Aftercare following surgery for neoplasm: Secondary | ICD-10-CM | POA: Insufficient documentation

## 2017-08-14 DIAGNOSIS — F418 Other specified anxiety disorders: Secondary | ICD-10-CM

## 2017-08-14 DIAGNOSIS — M25611 Stiffness of right shoulder, not elsewhere classified: Secondary | ICD-10-CM | POA: Diagnosis present

## 2017-08-14 LAB — CBC WITH DIFFERENTIAL (CANCER CENTER ONLY)
Basophils Absolute: 0 10*3/uL (ref 0.0–0.1)
Basophils Relative: 0 %
EOS ABS: 0 10*3/uL (ref 0.0–0.5)
Eosinophils Relative: 1 %
HCT: 38.7 % (ref 34.8–46.6)
HEMOGLOBIN: 12.9 g/dL (ref 11.6–15.9)
LYMPHS ABS: 1.5 10*3/uL (ref 0.9–3.3)
LYMPHS PCT: 69 %
MCH: 27.2 pg (ref 25.1–34.0)
MCHC: 33.3 g/dL (ref 31.5–36.0)
MCV: 81.6 fL (ref 79.5–101.0)
MONOS PCT: 14 %
Monocytes Absolute: 0.3 10*3/uL (ref 0.1–0.9)
NEUTROS ABS: 0.4 10*3/uL — AB (ref 1.5–6.5)
NEUTROS PCT: 16 %
Platelet Count: 176 10*3/uL (ref 145–400)
RBC: 4.74 MIL/uL (ref 3.70–5.45)
RDW: 14.2 % (ref 11.2–14.5)
WBC: 2.2 10*3/uL — AB (ref 3.9–10.3)

## 2017-08-14 LAB — CMP (CANCER CENTER ONLY)
ALT: 29 U/L (ref 0–55)
ANION GAP: 9 (ref 3–11)
AST: 24 U/L (ref 5–34)
Albumin: 3.6 g/dL (ref 3.5–5.0)
Alkaline Phosphatase: 92 U/L (ref 40–150)
BUN: 8 mg/dL (ref 7–26)
CHLORIDE: 106 mmol/L (ref 98–109)
CO2: 24 mmol/L (ref 22–29)
Calcium: 9.2 mg/dL (ref 8.4–10.4)
Creatinine: 0.82 mg/dL (ref 0.60–1.10)
Glucose, Bld: 170 mg/dL — ABNORMAL HIGH (ref 70–140)
POTASSIUM: 3.3 mmol/L — AB (ref 3.5–5.1)
SODIUM: 139 mmol/L (ref 136–145)
Total Bilirubin: 0.5 mg/dL (ref 0.2–1.2)
Total Protein: 7.3 g/dL (ref 6.4–8.3)

## 2017-08-14 MED ORDER — SODIUM CHLORIDE 0.9% FLUSH
10.0000 mL | Freq: Once | INTRAVENOUS | Status: AC
  Administered 2017-08-14: 10 mL via INTRAVENOUS
  Filled 2017-08-14: qty 10

## 2017-08-14 MED ORDER — SODIUM CHLORIDE 0.9 % IV SOLN
Freq: Once | INTRAVENOUS | Status: DC
  Filled 2017-08-14: qty 1000

## 2017-08-14 MED ORDER — HEPARIN SOD (PORK) LOCK FLUSH 100 UNIT/ML IV SOLN
500.0000 [IU] | Freq: Once | INTRAVENOUS | Status: AC
  Administered 2017-08-14: 500 [IU] via INTRAVENOUS
  Filled 2017-08-14: qty 5

## 2017-08-14 MED ORDER — SODIUM CHLORIDE 0.9 % IV SOLN: Freq: Once | INTRAVENOUS | Status: DC

## 2017-08-14 MED ORDER — TRAMADOL HCL 50 MG PO TABS: 50.0000 mg | ORAL_TABLET | Freq: Four times a day (QID) | ORAL | 0 refills | Status: DC | PRN

## 2017-08-14 MED ORDER — SODIUM CHLORIDE 0.9 % IV SOLN
Freq: Once | INTRAVENOUS | Status: AC
  Administered 2017-08-14: 14:00:00 via INTRAVENOUS
  Filled 2017-08-14: qty 1000

## 2017-08-14 NOTE — Patient Instructions (Signed)
Dehydration, Adult Dehydration is when there is not enough fluid or water in your body. This happens when you lose more fluids than you take in. Dehydration can range from mild to very bad. It should be treated right away to keep it from getting very bad. Symptoms of mild dehydration may include:  Thirst.  Dry lips.  Slightly dry mouth.  Dry, warm skin.  Dizziness. Symptoms of moderate dehydration may include:  Very dry mouth.  Muscle cramps.  Dark pee (urine). Pee may be the color of tea.  Your body making less pee.  Your eyes making fewer tears.  Heartbeat that is uneven or faster than normal (palpitations).  Headache.  Light-headedness, especially when you stand up from sitting.  Fainting (syncope). Symptoms of very bad dehydration may include:  Changes in skin, such as: ? Cold and clammy skin. ? Blotchy (mottled) or pale skin. ? Skin that does not quickly return to normal after being lightly pinched and let go (poor skin turgor).  Changes in body fluids, such as: ? Feeling very thirsty. ? Your eyes making fewer tears. ? Not sweating when body temperature is high, such as in hot weather. ? Your body making very little pee.  Changes in vital signs, such as: ? Weak pulse. ? Pulse that is more than 100 beats a minute when you are sitting still. ? Fast breathing. ? Low blood pressure.  Other changes, such as: ? Sunken eyes. ? Cold hands and feet. ? Confusion. ? Lack of energy (lethargy). ? Trouble waking up from sleep. ? Short-term weight loss. ? Unconsciousness. Follow these instructions at home:  If told by your doctor, drink an ORS: ? Make an ORS by using instructions on the package. ? Start by drinking small amounts, about  cup (120 mL) every 5-10 minutes. ? Slowly drink more until you have had the amount that your doctor said to have.  Drink enough clear fluid to keep your pee clear or pale yellow. If you were told to drink an ORS, finish the ORS  first, then start slowly drinking clear fluids. Drink fluids such as: ? Water. Do not drink only water by itself. Doing that can make the salt (sodium) level in your body get too low (hyponatremia). ? Ice chips. ? Fruit juice that you have added water to (diluted). ? Low-calorie sports drinks.  Avoid: ? Alcohol. ? Drinks that have a lot of sugar. These include high-calorie sports drinks, fruit juice that does not have water added, and soda. ? Caffeine. ? Foods that are greasy or have a lot of fat or sugar.  Take over-the-counter and prescription medicines only as told by your doctor.  Do not take salt tablets. Doing that can make the salt level in your body get too high (hypernatremia).  Eat foods that have minerals (electrolytes). Examples include bananas, oranges, potatoes, tomatoes, and spinach.  Keep all follow-up visits as told by your doctor. This is important. Contact a doctor if:  You have belly (abdominal) pain that: ? Gets worse. ? Stays in one area (localizes).  You have a rash.  You have a stiff neck.  You get angry or annoyed more easily than normal (irritability).  You are more sleepy than normal.  You have a harder time waking up than normal.  You feel: ? Weak. ? Dizzy. ? Very thirsty.  You have peed (urinated) only a small amount of very dark pee during 6-8 hours. Get help right away if:  You have symptoms of   very bad dehydration.  You cannot drink fluids without throwing up (vomiting).  Your symptoms get worse with treatment.  You have a fever.  You have a very bad headache.  You are throwing up or having watery poop (diarrhea) and it: ? Gets worse. ? Does not go away.  You have blood or something green (bile) in your throw-up.  You have blood in your poop (stool). This may cause poop to look black and tarry.  You have not peed in 6-8 hours.  You pass out (faint).  Your heart rate when you are sitting still is more than 100 beats a  minute.  You have trouble breathing. This information is not intended to replace advice given to you by your health care provider. Make sure you discuss any questions you have with your health care provider. Document Released: 01/26/2009 Document Revised: 10/20/2015 Document Reviewed: 05/26/2015 Elsevier Interactive Patient Education  2018 [employer]  

## 2017-08-14 NOTE — Addendum Note (Signed)
Addended by: Neysa Hotter on: 08/14/2017 11:18 AM   Modules accepted: Orders

## 2017-08-14 NOTE — Telephone Encounter (Signed)
Appt scheduled Letter/Calendar mailed to patient per 5/2 los

## 2017-08-14 NOTE — Therapy (Addendum)
[place], [place], [address] Phone: [phone]   Fax:  [phone]  Physical Therapy Treatment  Patient Details  Name: Caitlin Cohen MRN: [id number] Date of Birth: Jan 11, 1966 Referring Provider: Dr. [name]   Encounter Date: 08/14/2017  PT End of Session - 08/14/17 1144    Visit Number  2    Number of Visits  2    PT Start Time  1107    PT Stop Time  1144    PT Time Calculation (min)  37 min    Activity Tolerance  Patient tolerated treatment well    Behavior During Therapy  [facility] for tasks assessed/performed       Past Medical History:  Diagnosis Date  . Anemia   . Anxiety   . Cancer [facility])    breast cancer right  . Depression   . GERD (gastroesophageal reflux disease)   . Headache     Past Surgical History:  Procedure Laterality Date  . CERVICAL CONE BIOPSY    . CHOLECYSTECTOMY    . MASTECTOMY WITH RADIOACTIVE SEED GUIDED EXCISION AND AXILLARY SENTINEL LYMPH NODE BIOPSY Right 07/08/2017   Procedure: RIGHT LUMPTECTOMY WITH RADIOACTIVE SEED GUIDED EXCISION AND RIGHT AXILLARY SENTINEL LYMPH NODE BIOPSY ERAS PATHWAY;  Surgeon: [name], MD;  Location: [place];  Service: General;  Laterality: Right;  PEC BLOCK  . OPEN REDUCTION INTERNAL FIXATION (ORIF) DISTAL RADIAL FRACTURE  03/17/2012   Procedure: OPEN REDUCTION INTERNAL FIXATION (ORIF) DISTAL RADIAL FRACTURE;  Surgeon: Roseanne Kaufman, MD;  Location: [place];  Service: Orthopedics;  Laterality: Right;  Pre-operative a supra-clavical block right arm in addition to general anesthesia  . PORTACATH PLACEMENT N/A 07/30/2017   Procedure: INSERTION PORT-A-CATH WITH [place];  Surgeon: [name], MD;  Location: [place];  Service: General;  Laterality: N/A;  . TUBAL LIGATION      There were no vitals filed for this visit.  Subjective Assessment - 08/14/17 1113    Subjective  Patient underwent a right lumpectomy and sentinel node biopsy  on 07/08/17 with 0/1 axillary nodes positive. Her Oncotype score was 29 so she began chemo on 08/08/17. She is doing well overall and will undergo 4 cycles of Taxotere and Cytoxan. She will undergo radiation and anti-estrogen therapy.    Pertinent History  Patient underwent a right lumpectomy and sentinel node biopsy on 07/08/17 with 0/1 axillary nodes positive. Her Oncotype score was 29 so she began chemo on 08/08/17. She is doing well overall and will undergo 4 cycles of Taxotere and Cytoxan. She will undergo radiation and anti-estrogen therapy.    Patient Stated Goals  Make sure my arm is ok    Currently in Pain?  Yes    Pain Score  7     Pain Location  -- Lateral trunk just inferior to axilla    Pain Orientation  Right    Pain Descriptors / Indicators  Tingling;Numbness    Pain Type  Surgical pain    Pain Onset  More than a month ago    Pain Frequency  Intermittent    Aggravating Factors   Shirt rubbing on it    Pain Relieving Factors  Nothing    Multiple Pain Sites  No         [facility] PT Assessment - 08/14/17 0001      Assessment   Medical Diagnosis  s/p right lumpectomy and SLNB    Referring Provider  Dr. [name]  Onset Date/Surgical Date  07/08/17    Hand Dominance  Right    Prior Therapy  Baselines      Precautions   Precautions  Other (comment)    Precaution Comments  Recent breast surgery; right arm lymphedema risk      Restrictions   Weight Bearing Restrictions  No      Balance Screen   Has the patient fallen in the past 6 months  No    Has the patient had a decrease in activity level because of a fear of falling?   No    Is the patient reluctant to leave their home because of a fear of falling?   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      Posture/Postural Control   Posture/Postural Control  Postural limitations    Postural Limitations  Rounded Shoulders;Forward head      ROM / Strength   AROM / PROM / Strength  AROM      AROM   AROM Assessment Site  Shoulder    Right/Left Shoulder  Right    Right Shoulder Extension  50 Degrees    Right Shoulder Flexion  127 Degrees    Right Shoulder ABduction  142 Degrees    Right Shoulder Internal Rotation  39 Degrees    Right Shoulder External Rotation  68 Degrees        LYMPHEDEMA/ONCOLOGY QUESTIONNAIRE - 08/14/17 1121      Type   Cancer Type  Right breast      Surgeries   Lumpectomy Date  07/08/17    Sentinel Lymph Node Biopsy Date  07/08/17    Number Lymph Nodes Removed  1      Treatment   Active Chemotherapy Treatment  Yes    Date  08/08/17    Past Chemotherapy Treatment  No    Active Radiation Treatment  No    Past Radiation Treatment  No    Current Hormone Treatment  No    Past Hormone Therapy  No      What other symptoms do you have   Are you Having Heaviness or Tightness  Yes    Are you having Pain  Yes    Are you having pitting edema  No    Is it Hard or Difficult finding clothes that fit  No    Do you have infections  No    Is there Decreased scar mobility  No    Stemmer Sign  No      Lymphedema Assessments   Lymphedema Assessments  Upper extremities      Right Upper Extremity Lymphedema   10 cm Proximal to Olecranon Process  44.8 cm    Olecranon Process  31.3 cm    10 cm Proximal to Ulnar Styloid Process  27.5 cm  Just Proximal to Ulnar Styloid Process  19.6 cm    Across Hand at [employer]  21.5 cm    At [place] of 2nd Digit  7.6 cm      Left Upper Extremity Lymphedema   10 cm Proximal to Olecranon Process  44.8 cm    Olecranon Process  31.4 cm    10 cm Proximal to Ulnar Styloid Process  26.4 cm    Just Proximal to Ulnar Styloid Process  19.4 cm    Across Hand at [employer]  21.3 cm    At [place] of 2nd Digit  7.5 cm        Quick Dash - 08/14/17 0001    Open a tight or new jar  Mild difficulty    Do heavy household chores (wash walls, wash floors)  Mild difficulty    Carry a shopping bag or briefcase  No difficulty    Wash your back  Mild difficulty    Use a knife to cut food  No difficulty    During the past week, to what extent has your arm, shoulder or hand problem interfered with your normal social activities with family, friends, neighbors, or groups?  Slightly    During the past week, to what extent has your arm, shoulder or hand problem limited your work or other regular daily activities  Slightly    Arm, shoulder, or hand pain.  None    Tingling (pins and needles) in your arm, shoulder, or hand  None    Difficulty Sleeping  Mild difficulty    DASH Score  11.36 %                     PT Education - 08/14/17 1143    Education provided  Yes    Education Details  [employer] Program, [facility], importance of exercise for reducing recurrence risk and tolerating chemotherapy.    Person(s) Educated  Patient    Methods  Explanation;Handout Issued [employer] and [facility] brochures    Comprehension  Verbalized understanding           Breast Clinic Goals - 07/02/17 1441      Patient will be able to verbalize understanding of pertinent lymphedema risk reduction practices relevant to her diagnosis specifically related to skin care.   Time  1    Period  Days    Status  Achieved      Patient will be able to return demonstrate and/or verbalize understanding of the post-op home exercise program related to regaining shoulder range of motion.   Time  1     Period  Days    Status  Achieved      Patient will be able to verbalize understanding of the importance of attending the postoperative After Breast Cancer Class for further lymphedema risk reduction education and therapeutic exercise.   Time  1    Period  Days    Status  Achieved           Plan - 08/14/17 1144    Clinical Impression Statement  Patient was seen today for a post op f/u visit to ensure she had returned to baseline related to shoulder function and ROM. She is somewhat limited with shoulder flexion and abduction was was able to verbalize how to improve those measurements. She reported feeling like she has too much going on with work and chemo to undergo PT right now but agrees to call us if her ROM  does not improve.    Rehab Potential  Excellent    PT Treatment/Interventions  Therapeutic exercise;Patient/family education;ADLs/Self Care Home Management    PT Next Visit Plan  D/C patient    PT Home Exercise Plan  Post op shoulder ROM HEP    Consulted and Agree with Plan of Care  Patient       Patient will benefit from skilled therapeutic intervention in order to improve the following deficits and impairments:  Decreased knowledge of precautions, Impaired UE functional use, Decreased range of motion, Postural dysfunction, Pain  Visit Diagnosis: Malignant neoplasm of upper-outer quadrant of right breast in female, estrogen receptor positive (HCC)  Abnormal posture  Aftercare following surgery for neoplasm  Stiffness of right shoulder, not elsewhere classified     Problem List Patient Active Problem List   Diagnosis Date Noted  . Breast cancer, left ([place]) 07/08/2017  . Malignant neoplasm of upper-inner quadrant of right breast in female, estrogen receptor positive ([place]) 07/01/2017  . Mood disorder ([place]) 07/05/2013    Class: Acute    Annia Friendly, PT 08/14/17 11:48 AM  [place], [place], [address] Phone: [phone]   Fax:  [phone]  Name: Caitlin Cohen MRN: [id number] Date of Birth: January 04, 1966  PHYSICAL THERAPY DISCHARGE SUMMARY  Visits from Start of Care: 2  Current functional level related to goals / functional outcomes: Some mild limitation with right shoulder flexion and abduction. Otherwise no need for PT.   Remaining deficits: Some mild limitation with right shoulder flexion and abduction   Education / Equipment: Lymphedema risk reduction and ROM HEP Plan: Patient agrees to discharge.  Patient goals were met. Patient is being discharged due to meeting the stated rehab goals.  ?????    Annia Friendly, [place] 08/14/17 11:49 AM

## 2017-08-14 NOTE — Progress Notes (Signed)
[place]  [phone] [phone]  Clinic Follow up Note   Patient Care Team: Lucianne Lei, MD as PCP - General (Family Medicine) Truitt Merle, MD as Consulting Physician (Hematology) [name], MD as Consulting Physician (General Surgery) [name], MD as Attending Physician (Radiation Oncology) 08/14/2017  SUMMARY OF ONCOLOGIC HISTORY: Oncology History   Cancer Staging Malignant neoplasm of upper-inner quadrant of right breast in female, estrogen receptor positive ([place]) Staging form: Breast, AJCC 8th Edition - Clinical stage from 06/26/2017: Stage IIA (cT2, cN0, cM0, G3, ER: Positive, PR: Positive, HER2: Negative) - Signed by Truitt Merle, MD on 07/01/2017 - Pathologic stage from 07/08/2017: Stage IB (pT2, pN0, cM0, G3, ER+, PR+, HER2-, Oncotype DX score: 29) - Signed by [name], NP on 07/25/2017       Malignant neoplasm of upper-inner quadrant of right breast in female, estrogen receptor positive ([place])   06/23/2017 Mammogram    IMPRESSION: 1. There is a highly suspicious mass measuring 3.5 cm mammographically in the right breast at the palpable site identified by the patient. 2. There is 1 suspicious lymph node with cortex measuring up to 6 mm. 3.  No mammographic evidence of malignancy in the left breast.      06/26/2017 Initial Biopsy    Diagnosis 06/26/17 1. Breast, right, needle core biopsy, upper inner quadrant, 12:30 o'clock, 7cm from nipple - INVASIVE DUCTAL CARCINOMA - SEE COMMENT 2. Lymph node, needle/core biopsy, right axillary (level 2 node) - NO CARCINOMA IDENTIFIED IN ONE LYMPH NODE (0/1)      06/26/2017 Receptors her2    Prognostic indicators significant for: ER, 70% positive and PR, 70% positive, both with strong staining intensity. Proliferation marker Ki67 at 60%. HER2 negative.      07/01/2017 Initial Diagnosis    Malignant neoplasm of upper-inner quadrant of right breast in female, estrogen receptor  positive ([place])      07/08/2017 Pathology Results    Diagnosis 1. Breast, lumpectomy, Right - INVASIVE DUCTAL CARCINOMA, NOTTINGHAM GRADE 3 OF 3, 3.5 CM - MARGINS UNINVOLVED BY CARCINOMA (0.1 CM, SUPERIOR MARGIN) - PREVIOUS BIOPSY SITE CHANGES - SEE ONCOLOGY TABLE BELOW 2. Soft tissue, biopsy, Axillary - BENIGN FIBROADIPOSE TISSUE - NO MALIGNANCY IDENTIFIED 3. Lymph node, sentinel, biopsy, Right axillary - NO CARCINOMA IDENTIFIED IN ONE LYMPH NODE (0/1) 4. Lymph node, sentinel, biopsy, Right axillary - NO CARCINOMA IDENTIFIED IN ONE LYMPH NODE (0/1) - PREVIOUS BIOPSY SITE CHANGES - SEE COMMENT 5. Breast, excision, Right superior margin - BENIGN BREAST TISSUE - NO RESIDUAL CARCINOMA IDENTIFIED 6. Breast, excision, Right inferior margin - BENIGN BREAST TISSUE - NO RESIDUAL CARCINOMA IDENTIFIED      07/08/2017 Cancer Staging    Staging form: Breast, AJCC 8th Edition - Pathologic stage from 07/08/2017: Stage IB (pT2, pN0, cM0, G3, ER+, PR+, HER2-, Oncotype DX score: 29) - Signed by [name], NP on 07/25/2017      07/22/2017 Oncotype testing    Recurrence Score: 29 Distant Recurrence Risk at 9 years with Ai or Tamoxifen alone: 18% Absolute Chemotherapy Benefit: > 15 %      08/07/2017 -  Chemotherapy    The patient had palonosetron (ALOXI) injection 0.25 mg, 0.25 mg, Intravenous,  Once, 1 of 4 cycles Administration: 0.25 mg (08/08/2017) pegfilgrastim-cbqv (UDENYCA) injection 6 mg, 6 mg, Subcutaneous, Once, 1 of 4 cycles Administration: 6 mg (08/09/2017) cyclophosphamide (CYTOXAN) 1,640 mg in sodium chloride 0.9 % 250 mL chemo infusion, 600 mg/m2 = 1,640 mg, Intravenous,  Once,  1 of 4 cycles Administration: 1,640 mg (08/08/2017) DOCEtaxel (TAXOTERE) 210 mg in dextrose 5 % 500 mL chemo infusion, 75 mg/m2 = 210 mg, Intravenous,  Once, 1 of 4 cycles  for chemotherapy treatment.      CURRENT THERAPY: Adjuvant Taxotere/Cytoxan q3 weeks x4 cycles, started 08/08/17    INTERVAL  HISTORY: Ms. Belland returns as scheduled. She completed cycle 1 TC on 08/08/17 with udenyca on 08/09/17. She tolerated the infusion well. She developed "excruciating" bone pain in legs for 3 days after injection, eased on day 3. Advil and claritin did not provide adequate relief. She called [facility], no further orders were placed. She describes the pain as moderate to severe, occasionally woke her up from sleep. Her appetite decreased after chemo but she was still able to eat, although she has lost some weight. Drinking is difficult for her in general. She developed "slight" nausea on day 3, which was well controlled with compazine. She developed diarrhea on day 2 which persists today, reports 4 episodes loose stool per day to the point where her bottom became sore. Has not used anti-diarrhea. She did not have much fatigue, her decreased activity was mostly related to bone pain rather than fatigue. She went back to work 1 day ago, felt a little sluggish but was able to perform her duties. Denies fever, chills, cough, chest pain, dyspnea, or mucositis.   REVIEW OF SYSTEMS:   Constitutional: Denies fevers, chills (+) abnormal weight loss (+) decreased appetite (+) very mild fatigue  Eyes: Denies blurriness of vision Ears, nose, mouth, throat, and face: Denies mucositis or sore throat Respiratory: Denies cough, dyspnea or wheezes Cardiovascular: Denies palpitation, chest discomfort or lower extremity swelling Gastrointestinal:  Denies heartburn or change in bowel habits (+) mild nausea on day 3, controlled with compazine (+) diarrhea/loose stool days 2-6, 4 episodes per day Skin: Denies abnormal skin rashes Lymphatics: Denies new lymphadenopathy or easy bruising Neurological:Denies numbness, tingling or new weaknesses Behavioral/Psych: Mood is stable, no new changes  MSK: (+) moderate - severe bone pain in legs x3 days  All other systems were reviewed with the patient and are negative.  MEDICAL HISTORY:  Past  Medical History:  Diagnosis Date  . Anemia   . Anxiety   . Cancer [facility])    breast cancer right  . Depression   . GERD (gastroesophageal reflux disease)   . Headache     SURGICAL HISTORY: Past Surgical History:  Procedure Laterality Date  . CERVICAL CONE BIOPSY    . CHOLECYSTECTOMY    . MASTECTOMY WITH RADIOACTIVE SEED GUIDED EXCISION AND AXILLARY SENTINEL LYMPH NODE BIOPSY Right 07/08/2017   Procedure: RIGHT LUMPTECTOMY WITH RADIOACTIVE SEED GUIDED EXCISION AND RIGHT AXILLARY SENTINEL LYMPH NODE BIOPSY ERAS PATHWAY;  Surgeon: [name], MD;  Location: [facility];  Service: General;  Laterality: Right;  PEC BLOCK  . OPEN REDUCTION INTERNAL FIXATION (ORIF) DISTAL RADIAL FRACTURE  03/17/2012   Procedure: OPEN REDUCTION INTERNAL FIXATION (ORIF) DISTAL RADIAL FRACTURE;  Surgeon: Roseanne Kaufman, MD;  Location: [facility];  Service: Orthopedics;  Laterality: Right;  Pre-operative a supra-clavical block right arm in addition to general anesthesia  . PORTACATH PLACEMENT N/A 07/30/2017   Procedure: INSERTION PORT-A-CATH WITH [place];  Surgeon: [name], MD;  Location: [facility];  Service: General;  Laterality: N/A;  . TUBAL LIGATION      I have reviewed the social history and family history with the patient and they are unchanged from previous note.  ALLERGIES:  has No Known Allergies.  MEDICATIONS:  Current Outpatient Medications  Medication Sig Dispense Refill  . dexamethasone (DECADRON) 4 MG tablet Take 1 tablet (4 mg total) by mouth 2 (two) times daily with a meal. Take one tablet twice a day - day before chemo and day after chemo 6 tablet 0  . ibuprofen (ADVIL,MOTRIN) 200 MG tablet Take 400 mg by mouth daily as needed for mild pain or moderate pain.    [name] lidocaine-prilocaine (EMLA) cream Apply to affected area once 30 g 3  . ondansetron (ZOFRAN) 8 MG tablet Take 1 tablet (8 mg total) by mouth every 8 (eight) hours as needed for nausea or vomiting. 30 tablet 2  . prochlorperazine  (COMPAZINE) 10 MG tablet Take 1 tablet (10 mg total) by mouth every 6 (six) hours as needed for nausea or vomiting. 30 tablet 2  . traMADol (ULTRAM) 50 MG tablet Take 1 tablet (50 mg total) by mouth every 6 (six) hours as needed. 30 tablet 0   No current facility-administered medications for this visit.    Facility-Administered Medications Ordered in Other Visits  Medication Dose Route Frequency Provider Last Rate Last Dose  . sodium chloride 0.9 % 1,000 mL with potassium chloride 20 mEq infusion   Intravenous Once Raul Del L, RPH        PHYSICAL EXAMINATION: ECOG PERFORMANCE STATUS: 1 - Symptomatic but completely ambulatory  Vitals:   08/14/17 1018  BP: 135/81  Pulse: (!) 111  Resp: (!) 22  Temp: 98.6 F (37 C)  SpO2: 98%   Filed Weights   08/14/17 1018  Weight: (!) 339 lb 14.4 oz (154.2 kg)    GENERAL:alert, no distress and comfortable SKIN: skin color, texture, turgor are normal, no rashes or significant lesions EYES: normal, Conjunctiva are pink and non-injected, sclera clear OROPHARYNX:no exudate, no erythema and lips, buccal mucosa, and tongue normal  LYMPH:  no palpable lymphadenopathy  LUNGS: clear to auscultation with normal breathing effort HEART: tachycardic, regular rhythm and no murmurs and no lower extremity edema ABDOMEN:abdomen soft, non-tender and normal bowel sounds Musculoskeletal:no cyanosis of digits and no clubbing  NEURO: alert & oriented x 3 with fluent speech, no focal motor/sensory deficits PAC without erythema   LABORATORY DATA:  I have reviewed the data as listed CBC Latest Ref Rng & Units 08/14/2017 08/08/2017 07/30/2017  WBC 3.9 - 10.3 K/uL 2.2(L) 9.8 8.5  Hemoglobin 11.6 - 15.9 g/dL 12.9 13.8 13.3  Hematocrit 34.8 - 46.6 % 38.7 42.4 41.1  Platelets 145 - 400 K/uL 176 308 319     CMP Latest Ref Rng & Units 08/14/2017 08/08/2017 07/30/2017  Glucose 70 - 140 mg/dL 170(H) 216(H) 121(H)  BUN 7 - 26 mg/dL 8 15 19   Creatinine 0.60 - 1.10  mg/dL 0.82 0.91 0.92  Sodium 136 - 145 mmol/L 139 136 138  Potassium 3.5 - 5.1 mmol/L 3.3(L) 4.2 3.8  Chloride 98 - 109 mmol/L 106 107 109  CO2 22 - 29 mmol/L 24 20(L) 20(L)  Calcium 8.4 - 10.4 mg/dL 9.2 10.1 9.0  Total Protein 6.4 - 8.3 g/dL 7.3 8.7(H) -  Total Bilirubin 0.2 - 1.2 mg/dL 0.5 0.4 -  Alkaline Phos 40 - 150 U/L 92 94 -  AST 5 - 34 U/L 24 18 -  ALT 0 - 55 U/L 29 19 -   SURGICAL PATH 07/08/17       Diagnosis 1. Breast, lumpectomy, Right - INVASIVE DUCTAL CARCINOMA, NOTTINGHAM GRADE 3 OF 3, 3.5 CM - MARGINS UNINVOLVED BY CARCINOMA (0.1 CM,  SUPERIOR MARGIN) - PREVIOUS BIOPSY SITE CHANGES - SEE ONCOLOGY TABLE BELOW 2. Soft tissue, biopsy, Axillary - BENIGN FIBROADIPOSE TISSUE - NO MALIGNANCY IDENTIFIED 3. Lymph node, sentinel, biopsy, Right axillary - NO CARCINOMA IDENTIFIED IN ONE LYMPH NODE (0/1) 4. Lymph node, sentinel, biopsy, Right axillary - NO CARCINOMA IDENTIFIED IN ONE LYMPH NODE (0/1) - PREVIOUS BIOPSY SITE CHANGES - SEE COMMENT 5. Breast, excision, Right superior margin - BENIGN BREAST TISSUE - NO RESIDUAL CARCINOMA IDENTIFIED 6. Breast, excision, Right inferior margin - BENIGN BREAST TISSUE - NO RESIDUAL CARCINOMA IDENTIFIED  Microscopic Comment 1. BREAST, INVASIVE TUMOR Procedure: Excision Laterality: Right Tumor Size: 3.5 cm Histologic Type: Invasive ductal of no special type (ductal, not otherwise specified) Grade: Nottingham Grade 3 Tubular Differentiation: 3 Nuclear Pleomorphism: 2 Mitotic Count: 2 Ductal Carcinoma in Situ (DCIS): Not identified Margins: Uninvolved by carcinoma Invasive carcinoma, distance from closest margin: 0.2 cm, anterior margin (See comment below) DCIS, distance from closest margin: N/A Treatment effect: No known presurgical therapy Regional Lymph Nodes: Number of Lymph Nodes Examined: 2 Number of Sentinel Lymph Nodes Examined:2 Lymph Nodes with Macrometastases: 0 Lymph Nodes with Micrometastases: 0 Lymph Nodes  with Isolated Tumor Cells: 0 Breast Prognostic Profile: [id number] Estrogen Receptor: Positive (70%, strong) Progesterone Receptor: Positive (70%, strong) Her2: Negative (Ratio 1.21) Ki-67: 60% Best tumor block for sendout testing: 1A Pathologic Stage Classification (pTNM, AJCC 8th Edition): Primary Tumor: pT2 Regional Lymph Nodes: pN0 Comments: On the excision specimen (part 1), the tumor comes within 0.1 cm of the superior margin, 0.2 cm from the anterior margin and 0.5 cm from the inferior margin based on microscopic measurements. Additional superior and inferior margins were submitted which have no residual carcinoma; therefore, the closest margin is the anterior margin. 4. There is no evidence of isolated tumor cells in the examined lymph node, which is supported by negative cytokeratin AE1/3.  ONCOTYPE 29   RADIOGRAPHIC STUDIES: I have personally reviewed the radiological images as listed and agreed with the findings in the report. No results found.   ASSESSMENT & PLAN: 52 y.o.pre-menopausal woman, presented with screening discovered to Ductal carcinoma  1.Malignant neoplasm of the upper-ourterquadrant of right breast, base of ductal carcinoma, pT2N0M0, stage IB, grade3, ER+/PR +, HER2 -, Oncotype RS 29 2. Hypokalemia, secondary to diarrhea r/t chemotherapy  3. Bone pain, secondary to udenyca  4. Depression, anxiety 5. Cancer screening  Ms. Viveiros appears stable. She completed cycle 1 TC with udenyca on 08/08/17. She tolerated fairly well. She lost 5 lbs due to decreased po intake. She developed diarrhea lasting 1 week and moderate to severe bone pain, not well controlled with advil and claritin. She appears mildly dehydrated today secondary to GI losses. Labs reviewed: ANC 0.4. She is afebrile. We reviewed neutropenic precautions. She knows a fever in her case would be an oncologic emergency. She is mildly hypokalemic, secondary to diarrhea. Will support her with 1 L  NS + 20 mEq KCL over 2 hours today. She will return to clinic after lymphedema treatment to receive IVF. She will buy imodium and begin today. I encouraged her to increase po intake, especially liquids during times of increased GI output. She agrees. I prescribed tramadol PRN for mod-severe bone pain. F/u in 2 weeks prior to cycle 2 TC.   PLAN -Labs reviewed, discussed neutropenic precautions  -1 L NS + 20 mEq KCL over 2 hours today for dehydration, hypokalemia -Rx: Tramadol for bone pain (legs) -Begin imodium for diarrhea   -F/u 2 weeks with cycle  2  All questions were answered. The patient knows to call the clinic with any problems, questions or concerns. No barriers to learning was detected. I spent 20 minutes counseling the patient face to face. The total time spent in the appointment was 25 minutes and more than 50% was on counseling and review of test results     [name], NP 08/14/17

## 2017-08-15 ENCOUNTER — Other Ambulatory Visit: Payer: Self-pay

## 2017-08-15 ENCOUNTER — Ambulatory Visit: Payer: Self-pay | Admitting: Hematology

## 2017-08-26 ENCOUNTER — Other Ambulatory Visit: Payer: Self-pay

## 2017-08-26 DIAGNOSIS — C50211 Malignant neoplasm of upper-inner quadrant of right female breast: Secondary | ICD-10-CM

## 2017-08-26 DIAGNOSIS — Z170 Estrogen receptor positive status [ER+]: Principal | ICD-10-CM

## 2017-08-26 MED ORDER — DEXAMETHASONE 4 MG PO TABS: 4.0000 mg | ORAL_TABLET | Freq: Two times a day (BID) | ORAL | 0 refills | Status: DC

## 2017-08-28 NOTE — Progress Notes (Signed)
[place] [facility]  Telephone:([phone] Fax:([phone]) [phone]  Clinic Follow up Note   Patient Care Team: Lucianne Lei, MD as PCP - General (Family Medicine) Truitt Merle, MD as Consulting Physician (Hematology) [name], MD as Consulting Physician (General Surgery) [name], MD as Attending Physician (Radiation Oncology)   Date of Service:  08/29/2017  CHIEF COMPLAINTS:  Follow up for ductal carcinoma of the right breast, on adjuvant chemo   Oncology History   Cancer Staging Malignant neoplasm of upper-inner quadrant of right breast in female, estrogen receptor positive ([place]) Staging form: Breast, AJCC 8th Edition - Clinical stage from 06/26/2017: Stage IIA (cT2, cN0, cM0, G3, ER: Positive, PR: Positive, HER2: Negative) - Signed by Truitt Merle, MD on 07/01/2017 - Pathologic stage from 07/08/2017: Stage IB (pT2, pN0, cM0, G3, ER+, PR+, HER2-, Oncotype DX score: 29) - Signed by [name], NP on 07/25/2017       Malignant neoplasm of upper-inner quadrant of right breast in female, estrogen receptor positive ([place])   06/23/2017 Mammogram    IMPRESSION: 1. There is a highly suspicious mass measuring 3.5 cm mammographically in the right breast at the palpable site identified by the patient. 2. There is 1 suspicious lymph node with cortex measuring up to 6 mm. 3.  No mammographic evidence of malignancy in the left breast.      06/26/2017 Initial Biopsy    Diagnosis 06/26/17 1. Breast, right, needle core biopsy, upper inner quadrant, 12:30 o'clock, 7cm from nipple - INVASIVE DUCTAL CARCINOMA - SEE COMMENT 2. Lymph node, needle/core biopsy, right axillary (level 2 node) - NO CARCINOMA IDENTIFIED IN ONE LYMPH NODE (0/1)      06/26/2017 Receptors her2    Prognostic indicators significant for: ER, 70% positive and PR, 70% positive, both with strong staining intensity. Proliferation marker Ki67 at 60%. HER2 negative.      07/01/2017 Initial Diagnosis   Malignant neoplasm of upper-inner quadrant of right breast in female, estrogen receptor positive ([place])      07/08/2017 Pathology Results    Diagnosis 1. Breast, lumpectomy, Right - INVASIVE DUCTAL CARCINOMA, NOTTINGHAM GRADE 3 OF 3, 3.5 CM - MARGINS UNINVOLVED BY CARCINOMA (0.1 CM, SUPERIOR MARGIN) - PREVIOUS BIOPSY SITE CHANGES - SEE ONCOLOGY TABLE BELOW 2. Soft tissue, biopsy, Axillary - BENIGN FIBROADIPOSE TISSUE - NO MALIGNANCY IDENTIFIED 3. Lymph node, sentinel, biopsy, Right axillary - NO CARCINOMA IDENTIFIED IN ONE LYMPH NODE (0/1) 4. Lymph node, sentinel, biopsy, Right axillary - NO CARCINOMA IDENTIFIED IN ONE LYMPH NODE (0/1) - PREVIOUS BIOPSY SITE CHANGES - SEE COMMENT 5. Breast, excision, Right superior margin - BENIGN BREAST TISSUE - NO RESIDUAL CARCINOMA IDENTIFIED 6. Breast, excision, Right inferior margin - BENIGN BREAST TISSUE - NO RESIDUAL CARCINOMA IDENTIFIED      07/08/2017 Cancer Staging    Staging form: Breast, AJCC 8th Edition - Pathologic stage from 07/08/2017: Stage IB (pT2, pN0, cM0, G3, ER+, PR+, HER2-, Oncotype DX score: 29) - Signed by [name], NP on 07/25/2017      07/22/2017 Oncotype testing    Recurrence Score: 29 Distant Recurrence Risk at 9 years with Ai or Tamoxifen alone: 18% Absolute Chemotherapy Benefit: > 15 %      08/07/2017 -  Chemotherapy    Adjuvant chemo Taxotere/Cytoxanq3 weeks x4 cycles with Udenyca       HISTORY OF PRESENTING ILLNESS:  Caitlin Cohen 52 y.o. female is a here because of newly diagnosed breast cancer. The patient was referred by Dr. Elly Modena. The  patient presents to the clinic today by herself.  Prior to pt's abnormal mammogram she states she felt the lump which prompted her to get screening 1 month ago. Her last mammogram was in 2008 and was otherwise normal.   Pt's mammogram from 06/23/17 revealed a highly suspicious mass measuring 3.5 cm mammographically in the right breast at the palpable site  identified by the patient.There was also a 1 suspicious lymph node with cortex measuring up to 6 Mm. Her biopsy confirmed invasive ductal carcinoma and had reassuring results of no carcinoma identified in the lymph node.   Prognostic indicators significant for: ER, 70% positive and PR, 70% positive, both with strong staining intensity. Proliferation marker Ki67 at 60%. HER2 negative.  GYN HISTORY  Menarchal: xx LMP: xx Contraceptive: not currently, oral birth control in the past  HRT:  GP: 3:3 first at 52 years old   Today the patient notes she does have some pain due to biopsy. Pt denies hot flash. She still has a period but it is irregular. Her last period was over 1 month ago.    Pt has a PMHx of Depression for which she use to take medication. She states she is not feeling overwhelmed right now just mildly anxious. She has a past surgical history of cholecystectomy, tubal ligation, right wrist fracture. She reports she previously had part of her cervix removed due to discovery of cancer cells.   Pt does not have any FHx of Cancer that she knows of. Pt denies tobacco use and she drinks alcohol occasionally.   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 All three of her children live in [place].    CURRENT THERAPY: Adjuvant Taxotere/Cytoxan q3 weeks x4 cycles with Udenyca, started 08/08/17       INTERVAL HISTORY  Caitlin Cohen is here for a follow up and cycle 2 TC. She was last seen by me in 06/2017. In interim she was seen by NP Regan Rakers and NP Ria Comment, underwent her right breast lumpectomy and started adjuvant chemotherapy.  She presents to the clinic today accompanied by her sister. She notes she tolerated her first cycle TC. She notes her Neulasta injection lasted significantly for 3 days. She took Advil for the pain and was given tramadol prescription. She has not taken it often. She note her last period was 05/2017.    On review of symptoms, pt notes lower right leg bone pain  that is occasionally. Advil helped with this. She notes numbness of bottom of her feet, no toes or fingers. She denies being diabetic. She notes her diarrhea lasted for over a week. This is better controlled with imodium. She notes fatigue increased this week. She notes her appetite was down last week but improved this weeks. She notes she has occasional hot flashes.     MEDICAL HISTORY:  Past Medical History:  Diagnosis Date  . Anemia   . Anxiety   . Cancer [facility])    breast cancer right  . Depression   . GERD (gastroesophageal reflux disease)   . Headache     SURGICAL HISTORY: Past Surgical History:  Procedure Laterality Date  . CERVICAL CONE BIOPSY    . CHOLECYSTECTOMY    . MASTECTOMY WITH RADIOACTIVE SEED GUIDED EXCISION AND AXILLARY SENTINEL LYMPH NODE BIOPSY Right 07/08/2017   Procedure: RIGHT LUMPTECTOMY WITH RADIOACTIVE SEED GUIDED EXCISION AND RIGHT AXILLARY SENTINEL LYMPH NODE BIOPSY ERAS PATHWAY;  Surgeon: [name], MD;  Location: [facility];  Service: General;  Laterality:  Right;  PEC BLOCK  . OPEN REDUCTION INTERNAL FIXATION (ORIF) DISTAL RADIAL FRACTURE  03/17/2012   Procedure: OPEN REDUCTION INTERNAL FIXATION (ORIF) DISTAL RADIAL FRACTURE;  Surgeon: Roseanne Kaufman, MD;  Location: [facility];  Service: Orthopedics;  Laterality: Right;  Pre-operative a supra-clavical block right arm in addition to general anesthesia  . PORTACATH PLACEMENT N/A 07/30/2017   Procedure: INSERTION PORT-A-CATH WITH [place];  Surgeon: [name], MD;  Location: [facility];  Service: General;  Laterality: N/A;  . TUBAL LIGATION      SOCIAL HISTORY: Social History   Socioeconomic History  . Marital status: Single    Spouse name: Not on file  . Number of children: Not on file  . Years of education: Not on file  . Highest education level: Not on file  Occupational History  . Not on file  Social Needs  . Financial resource strain: Not on file  . Food insecurity:    Worry: Not on file     Inability: Not on file  . Transportation needs:    Medical: Not on file    Non-medical: Not on file  Tobacco Use  . Smoking status: Never Smoker  . Smokeless tobacco: Never Used  Substance and Sexual Activity  . Alcohol use: Yes    Comment: occassionally  . Drug use: No  . Sexual activity: Yes    Birth control/protection: Surgical  Lifestyle  . Physical activity:    Days per week: 2 days    Minutes per session: 30 min  . Stress: Only a little  Relationships  . Social connections:    Talks on phone: More than three times a week    Gets together: Once a week    Attends religious service: Never    Active member of club or organization: No    Attends meetings of clubs or organizations: Never    Relationship status: Never married  . Intimate partner violence:    Fear of current or ex partner: No    Emotionally abused: Yes    Physically abused: No    Forced sexual activity: No  Other Topics Concern  . Not on file  Social History Narrative   Lives with roommate   Works fulltime but seasonal work    FAMILY HISTORY: Family History  Problem Relation Age of Onset  . Hypertension Mother   . Diabetes Father   . Hypertension Father     ALLERGIES:  has No Known Allergies.  MEDICATIONS:  Current Outpatient Medications  Medication Sig Dispense Refill  . dexamethasone (DECADRON) 4 MG tablet Take 1 tablet (4 mg total) by mouth 2 (two) times daily with a meal. Take one tablet twice a day - day before chemo and day after chemo 6 tablet 0  . ibuprofen (ADVIL,MOTRIN) 200 MG tablet Take 400 mg by mouth daily as needed for mild pain or moderate pain.    [name] lidocaine-prilocaine (EMLA) cream Apply to affected area once 30 g 3  . ondansetron (ZOFRAN) 8 MG tablet Take 1 tablet (8 mg total) by mouth every 8 (eight) hours as needed for nausea or vomiting. 30 tablet 2  . prochlorperazine (COMPAZINE) 10 MG tablet Take 1 tablet (10 mg total) by mouth every 6 (six) hours as needed for nausea or  vomiting. 30 tablet 2  . traMADol (ULTRAM) 50 MG tablet Take 1 tablet (50 mg total) by mouth every 6 (six) hours as needed. 30 tablet 0   No current facility-administered medications for this visit.  REVIEW OF SYSTEMS:   Constitutional: Denies fevers, chills or abnormal night sweats (+) hot flashes, manageable  Eyes: Denies blurriness of vision, double vision or watery eyes Ears, nose, mouth, throat, and face: Denies mucositis or sore throat Respiratory: Denies cough, dyspnea or wheezes Cardiovascular: Denies palpitation, chest discomfort or lower extremity swelling Gastrointestinal:  Denies nausea, heartburn or change in bowel habits Skin: Denies abnormal skin rashes MSK: (+) significant bone pain, especially in right lower leg Lymphatics: Denies new lymphadenopathy or easy bruising Neurological:Denies numbness, tingling or new weaknesses Behavioral/Psych: Mood is stable, no new changes (+) Numbness of bottom of feet Breast: (+) right breast soreness from biopsy All other systems were reviewed with the patient and are negative.  PHYSICAL EXAMINATION: ECOG PERFORMANCE STATUS: 0 - Asymptomatic  Vitals:   08/29/17 1132  BP: (!) 152/91  Pulse: (!) 111  Resp: 18  Temp: 97.9 F (36.6 C)  TempSrc: Oral  SpO2: 98%  Weight: (!) 346 lb 4.8 oz (157.1 kg)  Height: 5' 7"  (1.702 m)    GENERAL:alert, no distress and comfortable SKIN: skin color, texture, turgor are normal, no rashes or significant lesions EYES: normal, conjunctiva are pink and non-injected, sclera clear OROPHARYNX:no exudate, no erythema and lips, buccal mucosa, and tongue normal  NECK: supple, thyroid normal size, non-tender, without nodularity LYMPH:  no palpable lymphadenopathy in the cervical, axillary or inguinal LUNGS: clear to auscultation and percussion with normal breathing effort HEART: regular rate & rhythm and no murmurs and no lower extremity edema ABDOMEN:abdomen soft, non-tender and normal bowel  sounds Musculoskeletal:no cyanosis of digits and no clubbing  PSYCH: alert & oriented x 3 with fluent speech NEURO: no focal motor/sensory deficits Breast exam deferred today   LABORATORY DATA:  I have reviewed the data as listed CBC Latest Ref Rng & Units 08/29/2017 08/14/2017 08/08/2017  WBC 3.9 - 10.3 K/uL 8.7 2.2(L) 9.8  Hemoglobin 11.6 - 15.9 g/dL 12.3 12.9 13.8  Hematocrit 34.8 - 46.6 % 37.2 38.7 42.4  Platelets 145 - 400 K/uL 398 176 308    CMP Latest Ref Rng & Units 08/29/2017 08/14/2017 08/08/2017  Glucose 70 - 140 mg/dL 239(H) 170(H) 216(H)  BUN 7 - 26 mg/dL 15 8 15   Creatinine 0.60 - 1.10 mg/dL 0.89 0.82 0.91  Sodium 136 - 145 mmol/L 137 139 136  Potassium 3.5 - 5.1 mmol/L 3.8 3.3(L) 4.2  Chloride 98 - 109 mmol/L 106 106 107  CO2 22 - 29 mmol/L 22 24 20(L)  Calcium 8.4 - 10.4 mg/dL 9.5 9.2 10.1  Total Protein 6.4 - 8.3 g/dL 7.7 7.3 8.7(H)  Total Bilirubin 0.2 - 1.2 mg/dL 0.3 0.5 0.4  Alkaline Phos 40 - 150 U/L 79 92 94  AST 5 - 34 U/L 19 24 18   ALT 0 - 55 U/L 24 29 19    PATHOLOGY  Diagnosis 06/26/17 1. Breast, right, needle core biopsy, upper inner quadrant, 12:30 o'clock, 7cm from nipple - INVASIVE DUCTAL CARCINOMA - SEE COMMENT 2. Lymph node, needle/core biopsy, right axillary (level 2 node) - NO CARCINOMA IDENTIFIED IN ONE LYMPH NODE (0/1) Results: HER2 - NEGATIVE RATIO OF HER2/CEP17 SIGNALS 1.21 AVERAGE HER2 COPY NUMBER PER CELL 2.00 Results: IMMUNOHISTOCHEMICAL AND MORPHOMETRIC ANALYSIS PERFORMED MANUALLY Estrogen Receptor: 70%, POSITIVE, STRONG STAINING INTENSITY Progesterone Receptor: 70%, POSITIVE, STRONG STAINING INTENSITY Proliferation Marker Ki67: 60%  RADIOGRAPHIC STUDIES: I have personally reviewed the radiological images as listed and agreed with the findings in the report.  Diagnostic Mammogram 06/23/17 IMPRESSION: 1. There is a highly suspicious  mass measuring 3.5 cm mammographically in the right breast at the palpable site identified by the  patient. 2. There is 1 suspicious lymph node with cortex measuring up to 6 mm. 3.  No mammographic evidence of malignancy in the left breast.   No results found.  ASSESSMENT & PLAN:  52 y.o. pre-menopausal woman, presented with screening discovered to Ductal carcinoma  1.  Malignant neoplasm of the upper-ourter quadrant of right breast, base of ductal carcinoma, pT2N0M0, stage IIA, grade 3, ER+ /PR +, HER2 -, stage IB, Oncotype RS 29 -She underwent Right breast lumpectomy and right axillary sentinel lymph node biopsy on 07/08/17. -We previously reviewed her pathology and oncotype score in detail. She had complete resection, margins are negative. She has grade 3, ER/PR positive, HER2 negative, and node-negative disease. -Given her age, pathologic features, and oncotype score of 29, she will benefit from adjuvant chemotherapy. I recommend TC q3 weeks x4 cycles. She has no other co-morbidities except obesity and has good social support, she would be a good candidate for chemotherapy.  -She started TC with Udenyca on 08/08/17. She tolerated fairly well. She lost 5 lbs due to decreased po intake. She developed diarrhea lasting 1 week and moderate to severe bone pain, not well controlled with Advil and Claritin. She appears mildly dehydrated today secondary to GI losses. She received IV Fluid and Potassium on 08/14/17.  -She has not had a her period since 05/2017, and prior to her periods were irregular. It is likely she is perimenopause. Will continue to monitor.  -Labs reviewed, her blood counts overall normalized after Neulasta injection. However she experienced significant bone pain. She was previously prescribed Tramadol. She will continue tramadol and if bone pain is not controlled I will change her to Granix.  -Diarrhea is controlled with imodium. I recommend she takes imodium at first sign of loose stool. I encouraged her to drink plenty of water.  -I explained she already has numbness of the bottom of  her foot after first cycle and neuropathy may not completely recover after treatment. I offered her the option of cold gloves and ice bag for feet to decrease her risk of progressive neuropathy. She agreed to try the cold glove today (08/29/17) -I recommend she start with OTC Vitamin B12 or B complex.  -Labs adequate to proceed with cycle 2 TC today.  -She will be seen by Lacie next week and before cycle 3 to monitor her more toxicities. -F/u with me on cycle 4.   2. Depression/Anxiety -She has a hx of depression and was previously treated with medication. Not currently on medication and she feels that her depression is controlled. Mildly anxious about her diagnosis -I previously offered social work services and she agreed -Her main concern is having to feel dependent on others, but she has good social support. I contacted our breast RN navigator to help assist her with transportation, she agreed  3.  Steroids induced hyperglycemia -Her random blood glucose was 239 today, this is likely steroid-induced hyperglycemia.  She is very obese, likely at high risk for diabetes. -I encouraged her to avoid sugar and desserts, will monitor her blood glucose closely.   4. GCSF induced bone pain -She will take Tylenol, or tramadol as needed -If she still has severe bone pain after this cycle, will probably change to Granix  Plan -Labs reveiwed and adequate to proceed with cycle 2 TC today  -Lab, flush, f/u with Lacie and 2hr IVF in one week  -Lab, flush,  f/u and chemo TC in 6 weeks  -Neulasta injection one day after each chemo, starting tomorrow   No orders of the defined types were placed in this encounter.   All questions were answered. The patient knows to call the clinic with any problems, questions or concerns. I spent 20 minutes counseling the patient face to face. The total time spent in the appointment was 25 minutes and more than 50% was on counseling.  This document serves as a record of  services personally performed by Truitt Merle, MD. It was created on her behalf by Joslyn Devon, a trained medical scribe. The creation of this record is based on the scribe's personal observations and the provider's statements to them.    I have reviewed the above documentation for accuracy and completeness, and I agree with the above.     Truitt Merle, MD 08/29/2017

## 2017-08-29 ENCOUNTER — Inpatient Hospital Stay: Payer: BLUE CROSS/BLUE SHIELD

## 2017-08-29 ENCOUNTER — Inpatient Hospital Stay (HOSPITAL_BASED_OUTPATIENT_CLINIC_OR_DEPARTMENT_OTHER): Payer: BLUE CROSS/BLUE SHIELD | Admitting: Hematology

## 2017-08-29 ENCOUNTER — Encounter: Payer: Self-pay | Admitting: Hematology

## 2017-08-29 VITALS — BP 152/91 | HR 111 | Temp 97.9°F | Resp 18 | Ht 67.0 in | Wt 346.3 lb

## 2017-08-29 DIAGNOSIS — M898X9 Other specified disorders of bone, unspecified site: Secondary | ICD-10-CM | POA: Diagnosis not present

## 2017-08-29 DIAGNOSIS — C50211 Malignant neoplasm of upper-inner quadrant of right female breast: Secondary | ICD-10-CM

## 2017-08-29 DIAGNOSIS — R739 Hyperglycemia, unspecified: Secondary | ICD-10-CM

## 2017-08-29 DIAGNOSIS — Z170 Estrogen receptor positive status [ER+]: Secondary | ICD-10-CM | POA: Diagnosis not present

## 2017-08-29 DIAGNOSIS — F418 Other specified anxiety disorders: Secondary | ICD-10-CM | POA: Diagnosis not present

## 2017-08-29 DIAGNOSIS — Z5111 Encounter for antineoplastic chemotherapy: Secondary | ICD-10-CM | POA: Diagnosis not present

## 2017-08-29 DIAGNOSIS — Z95828 Presence of other vascular implants and grafts: Secondary | ICD-10-CM | POA: Insufficient documentation

## 2017-08-29 DIAGNOSIS — T380X5A Adverse effect of glucocorticoids and synthetic analogues, initial encounter: Secondary | ICD-10-CM | POA: Diagnosis not present

## 2017-08-29 LAB — CBC WITH DIFFERENTIAL (CANCER CENTER ONLY)
BASOS PCT: 0 %
Basophils Absolute: 0 10*3/uL (ref 0.0–0.1)
EOS ABS: 0 10*3/uL (ref 0.0–0.5)
Eosinophils Relative: 0 %
HCT: 37.2 % (ref 34.8–46.6)
HEMOGLOBIN: 12.3 g/dL (ref 11.6–15.9)
Lymphocytes Relative: 19 %
Lymphs Abs: 1.7 10*3/uL (ref 0.9–3.3)
MCH: 27.2 pg (ref 25.1–34.0)
MCHC: 33.1 g/dL (ref 31.5–36.0)
MCV: 82.3 fL (ref 79.5–101.0)
Monocytes Absolute: 0.2 10*3/uL (ref 0.1–0.9)
Monocytes Relative: 2 %
NEUTROS PCT: 79 %
Neutro Abs: 6.8 10*3/uL — ABNORMAL HIGH (ref 1.5–6.5)
Platelet Count: 398 10*3/uL (ref 145–400)
RBC: 4.52 MIL/uL (ref 3.70–5.45)
RDW: 15.2 % — ABNORMAL HIGH (ref 11.2–14.5)
WBC: 8.7 10*3/uL (ref 3.9–10.3)

## 2017-08-29 LAB — CMP (CANCER CENTER ONLY)
ALT: 24 U/L (ref 0–55)
AST: 19 U/L (ref 5–34)
Albumin: 3.8 g/dL (ref 3.5–5.0)
Alkaline Phosphatase: 79 U/L (ref 40–150)
Anion gap: 9 (ref 3–11)
BUN: 15 mg/dL (ref 7–26)
CALCIUM: 9.5 mg/dL (ref 8.4–10.4)
CO2: 22 mmol/L (ref 22–29)
CREATININE: 0.89 mg/dL (ref 0.60–1.10)
Chloride: 106 mmol/L (ref 98–109)
Glucose, Bld: 239 mg/dL — ABNORMAL HIGH (ref 70–140)
Potassium: 3.8 mmol/L (ref 3.5–5.1)
Sodium: 137 mmol/L (ref 136–145)
Total Bilirubin: 0.3 mg/dL (ref 0.2–1.2)
Total Protein: 7.7 g/dL (ref 6.4–8.3)

## 2017-08-29 MED ORDER — DEXAMETHASONE SODIUM PHOSPHATE 10 MG/ML IJ SOLN
10.0000 mg | Freq: Once | INTRAMUSCULAR | Status: AC
  Administered 2017-08-29: 10 mg via INTRAVENOUS

## 2017-08-29 MED ORDER — SODIUM CHLORIDE 0.9% FLUSH
10.0000 mL | INTRAVENOUS | Status: DC | PRN
  Administered 2017-08-29: 10 mL
  Filled 2017-08-29: qty 10

## 2017-08-29 MED ORDER — SODIUM CHLORIDE 0.9 % IV SOLN
Freq: Once | INTRAVENOUS | Status: AC
  Administered 2017-08-29: 13:00:00 via INTRAVENOUS

## 2017-08-29 MED ORDER — SODIUM CHLORIDE 0.9 % IV SOLN
600.0000 mg/m2 | Freq: Once | INTRAVENOUS | Status: AC
  Administered 2017-08-29: 1640 mg via INTRAVENOUS
  Filled 2017-08-29: qty 82

## 2017-08-29 MED ORDER — PALONOSETRON HCL INJECTION 0.25 MG/5ML
0.2500 mg | Freq: Once | INTRAVENOUS | Status: AC
  Administered 2017-08-29: 0.25 mg via INTRAVENOUS

## 2017-08-29 MED ORDER — DEXAMETHASONE SODIUM PHOSPHATE 10 MG/ML IJ SOLN
INTRAMUSCULAR | Status: AC
  Filled 2017-08-29: qty 1

## 2017-08-29 MED ORDER — HEPARIN SOD (PORK) LOCK FLUSH 100 UNIT/ML IV SOLN
500.0000 [IU] | Freq: Once | INTRAVENOUS | Status: AC | PRN
  Administered 2017-08-29: 500 [IU]
  Filled 2017-08-29: qty 5

## 2017-08-29 MED ORDER — PALONOSETRON HCL INJECTION 0.25 MG/5ML
INTRAVENOUS | Status: AC
  Filled 2017-08-29: qty 5

## 2017-08-29 MED ORDER — DOCETAXEL CHEMO INJECTION 160 MG/16ML
75.0000 mg/m2 | Freq: Once | INTRAVENOUS | Status: AC
  Administered 2017-08-29: 210 mg via INTRAVENOUS
  Filled 2017-08-29: qty 21

## 2017-08-29 NOTE — Patient Instructions (Signed)
[facility] Discharge Instructions for Patients Receiving Chemotherapy  Today you received the following chemotherapy agents Taxotere and Cytoxan  To help prevent nausea and vomiting after your treatment, we encourage you to take your nausea medication as directed.    If you develop nausea and vomiting that is not controlled by your nausea medication, call the clinic.   BELOW ARE SYMPTOMS THAT SHOULD BE REPORTED IMMEDIATELY:  *FEVER GREATER THAN 100.5 F  *CHILLS WITH OR WITHOUT FEVER  NAUSEA AND VOMITING THAT IS NOT CONTROLLED WITH YOUR NAUSEA MEDICATION  *UNUSUAL SHORTNESS OF BREATH  *UNUSUAL BRUISING OR BLEEDING  TENDERNESS IN MOUTH AND THROAT WITH OR WITHOUT PRESENCE OF ULCERS  *URINARY PROBLEMS  *BOWEL PROBLEMS  UNUSUAL RASH Items with * indicate a potential emergency and should be followed up as soon as possible.  Feel free to call the clinic should you have any questions or concerns. The clinic phone number is [phone].  Please show the CHEMO ALERT CARD at check-in to the Emergency Department and triage nurse.   

## 2017-08-30 ENCOUNTER — Other Ambulatory Visit: Payer: Self-pay | Admitting: Hematology

## 2017-08-30 ENCOUNTER — Encounter: Payer: Self-pay | Admitting: Hematology

## 2017-08-30 DIAGNOSIS — T380X5A Adverse effect of glucocorticoids and synthetic analogues, initial encounter: Principal | ICD-10-CM

## 2017-08-30 DIAGNOSIS — E099 Drug or chemical induced diabetes mellitus without complications: Secondary | ICD-10-CM

## 2017-09-01 ENCOUNTER — Other Ambulatory Visit: Payer: Self-pay | Admitting: Hematology

## 2017-09-01 ENCOUNTER — Ambulatory Visit: Payer: Self-pay

## 2017-09-01 MED ORDER — PEGFILGRASTIM-CBQV 6 MG/0.6ML ~~LOC~~ SOSY
PREFILLED_SYRINGE | SUBCUTANEOUS | Status: AC
Start: 2017-09-01 — End: ?
  Filled 2017-09-01: qty 0.6

## 2017-09-03 ENCOUNTER — Other Ambulatory Visit: Payer: Self-pay | Admitting: Hematology

## 2017-09-03 ENCOUNTER — Telehealth: Payer: Self-pay | Admitting: *Deleted

## 2017-09-03 DIAGNOSIS — M898X9 Other specified disorders of bone, unspecified site: Secondary | ICD-10-CM

## 2017-09-03 MED ORDER — ACETAMINOPHEN-CODEINE #3 300-30 MG PO TABS: 1.0000 | ORAL_TABLET | Freq: Three times a day (TID) | ORAL | 0 refills | Status: DC | PRN

## 2017-09-03 MED ORDER — DULOXETINE HCL 30 MG PO CPEP: 30.0000 mg | ORAL_CAPSULE | Freq: Every day | ORAL | 0 refills | Status: DC

## 2017-09-03 NOTE — Telephone Encounter (Signed)
I called her back, she has not been sleeping well due to her pain, which is likely related to chemo (she did not have neulasta with last cycle chemo last week). I will call in Cymbalta 30 mg once daily, #20, and Tylenol #3 1 tablet every 8 hours as needed for severe pain, total of 15 tablets.  She will see [place] back in 2 days.  Truitt Merle MD

## 2017-09-03 NOTE — Telephone Encounter (Signed)
Pt called reporting pain in legs and arms, and requested a call back from nurse.   Spoke with pt and was informed that since Saturday evening, pt experienced pain in both legs and arms.  Stated she took Tramadol, Ibuprofen, and Claritin with minimal relief.  Stated she took Claritin a few days ( Wed prior to Friday chemo date ).   Noted pt had Taxotere and Cytoxan last Friday  08/29/17. Pt denied redness, denied warm or tender to touch, denied swelling in legs nor arms.  Stated able to ambulate fine, and can perform ADLs ok when arms not hurting. Stated she takes Ibuprofen during day while at work, and Tramadol at night due to drowsiness effect. Pt would like to know what Dr. Burr Medico would suggest. Pt's    Phone      [phone].

## 2017-09-05 ENCOUNTER — Inpatient Hospital Stay: Payer: BLUE CROSS/BLUE SHIELD

## 2017-09-05 ENCOUNTER — Telehealth: Payer: Self-pay | Admitting: Hematology

## 2017-09-05 ENCOUNTER — Telehealth: Payer: Self-pay | Admitting: Nurse Practitioner

## 2017-09-05 ENCOUNTER — Inpatient Hospital Stay (HOSPITAL_BASED_OUTPATIENT_CLINIC_OR_DEPARTMENT_OTHER): Payer: BLUE CROSS/BLUE SHIELD | Admitting: Nurse Practitioner

## 2017-09-05 ENCOUNTER — Encounter: Payer: Self-pay | Admitting: Nurse Practitioner

## 2017-09-05 ENCOUNTER — Other Ambulatory Visit: Payer: Self-pay

## 2017-09-05 VITALS — BP 134/81 | HR 103 | Temp 98.1°F | Resp 18 | Ht 67.0 in | Wt 332.8 lb

## 2017-09-05 DIAGNOSIS — M898X9 Other specified disorders of bone, unspecified site: Secondary | ICD-10-CM

## 2017-09-05 DIAGNOSIS — C50211 Malignant neoplasm of upper-inner quadrant of right female breast: Secondary | ICD-10-CM

## 2017-09-05 DIAGNOSIS — Z95828 Presence of other vascular implants and grafts: Secondary | ICD-10-CM

## 2017-09-05 DIAGNOSIS — E099 Drug or chemical induced diabetes mellitus without complications: Secondary | ICD-10-CM | POA: Diagnosis not present

## 2017-09-05 DIAGNOSIS — E876 Hypokalemia: Secondary | ICD-10-CM | POA: Diagnosis not present

## 2017-09-05 DIAGNOSIS — Z170 Estrogen receptor positive status [ER+]: Secondary | ICD-10-CM

## 2017-09-05 DIAGNOSIS — R7309 Other abnormal glucose: Secondary | ICD-10-CM | POA: Diagnosis not present

## 2017-09-05 DIAGNOSIS — Z5111 Encounter for antineoplastic chemotherapy: Secondary | ICD-10-CM | POA: Diagnosis not present

## 2017-09-05 DIAGNOSIS — D701 Agranulocytosis secondary to cancer chemotherapy: Secondary | ICD-10-CM

## 2017-09-05 DIAGNOSIS — T380X5A Adverse effect of glucocorticoids and synthetic analogues, initial encounter: Secondary | ICD-10-CM | POA: Diagnosis not present

## 2017-09-05 DIAGNOSIS — T451X5A Adverse effect of antineoplastic and immunosuppressive drugs, initial encounter: Secondary | ICD-10-CM

## 2017-09-05 LAB — COMPREHENSIVE METABOLIC PANEL
ALK PHOS: 73 U/L (ref 38–126)
ALT: 35 U/L (ref 14–54)
AST: 31 U/L (ref 15–41)
Albumin: 4 g/dL (ref 3.5–5.0)
Anion gap: 11 (ref 5–15)
BUN: 14 mg/dL (ref 6–20)
CALCIUM: 8.9 mg/dL (ref 8.9–10.3)
CO2: 24 mmol/L (ref 22–32)
Chloride: 100 mmol/L — ABNORMAL LOW (ref 101–111)
Creatinine, Ser: 0.76 mg/dL (ref 0.44–1.00)
GFR calc Af Amer: >60 mL/min (ref >60)
GFR calc non Af Amer: >60 mL/min (ref >60)
GLUCOSE: 131 mg/dL — AB (ref 65–99)
Potassium: 3.4 mmol/L — ABNORMAL LOW (ref 3.5–5.1)
SODIUM: 135 mmol/L (ref 135–145)
TOTAL PROTEIN: 7.8 g/dL (ref 6.5–8.1)
Total Bilirubin: 0.4 mg/dL (ref 0.3–1.2)

## 2017-09-05 LAB — CBC WITH DIFFERENTIAL (CANCER CENTER ONLY)
Basophils Absolute: 0 10*3/uL (ref 0.0–0.1)
Basophils Relative: 2 %
EOS ABS: 0 10*3/uL (ref 0.0–0.5)
Eosinophils Relative: 0 %
HCT: 35.3 % (ref 34.8–46.6)
HEMOGLOBIN: 11.6 g/dL (ref 11.6–15.9)
Lymphocytes Relative: 87 %
Lymphs Abs: 1 10*3/uL (ref 0.9–3.3)
MCH: 26.7 pg (ref 25.1–34.0)
MCHC: 32.9 g/dL (ref 31.5–36.0)
MCV: 81.2 fL (ref 79.5–101.0)
MONO ABS: 0.1 10*3/uL (ref 0.1–0.9)
Monocytes Relative: 5 %
NEUTROS ABS: 0.1 10*3/uL — AB (ref 1.5–6.5)
Neutrophils Relative %: 6 %
Platelet Count: 250 10*3/uL (ref 145–400)
RBC: 4.35 MIL/uL (ref 3.70–5.45)
RDW: 14.3 % (ref 11.2–14.5)
WBC: 1.2 10*3/uL — AB (ref 3.9–10.3)

## 2017-09-05 LAB — HEMOGLOBIN A1C
HEMOGLOBIN A1C: 6.7 % — AB (ref 4.8–5.6)
MEAN PLASMA GLUCOSE: 145.59 mg/dL

## 2017-09-05 MED ORDER — SODIUM CHLORIDE 0.9% FLUSH
10.0000 mL | INTRAVENOUS | Status: DC | PRN
  Administered 2017-09-05: 10 mL
  Filled 2017-09-05: qty 10

## 2017-09-05 MED ORDER — METFORMIN HCL 500 MG PO TABS
500.0000 mg | ORAL_TABLET | Freq: Two times a day (BID) | ORAL | 0 refills | Status: DC
Start: 2017-09-05 — End: 2017-10-13

## 2017-09-05 MED ORDER — SODIUM CHLORIDE 0.9 % IV SOLN: Freq: Once | INTRAVENOUS | Status: DC

## 2017-09-05 MED ORDER — HEPARIN SOD (PORK) LOCK FLUSH 100 UNIT/ML IV SOLN
500.0000 [IU] | Freq: Once | INTRAVENOUS | Status: AC | PRN
  Administered 2017-09-05: 500 [IU]
  Filled 2017-09-05: qty 5

## 2017-09-05 MED ORDER — TBO-FILGRASTIM 480 MCG/0.8ML ~~LOC~~ SOSY
480.0000 ug | PREFILLED_SYRINGE | Freq: Once | SUBCUTANEOUS | Status: AC
  Administered 2017-09-05: 480 ug via SUBCUTANEOUS

## 2017-09-05 MED ORDER — SODIUM CHLORIDE 0.9% FLUSH
10.0000 mL | Freq: Once | INTRAVENOUS | Status: AC
  Administered 2017-09-05: 10 mL via INTRAVENOUS
  Filled 2017-09-05: qty 10

## 2017-09-05 MED ORDER — SODIUM CHLORIDE 0.9 % IV SOLN
Freq: Once | INTRAVENOUS | Status: AC
  Administered 2017-09-05: 14:00:00 via INTRAVENOUS

## 2017-09-05 MED ORDER — ALTEPLASE 2 MG IJ SOLR
2.0000 mg | Freq: Once | INTRAMUSCULAR | Status: DC | PRN
  Filled 2017-09-05: qty 2

## 2017-09-05 MED ORDER — POTASSIUM CHLORIDE 10 MEQ/100ML IV SOLN: 10.0000 meq | Freq: Once | INTRAVENOUS | Status: DC

## 2017-09-05 NOTE — Telephone Encounter (Signed)
NO los 5/24

## 2017-09-05 NOTE — Patient Instructions (Signed)
Dehydration, Adult Dehydration is when there is not enough fluid or water in your body. This happens when you lose more fluids than you take in. Dehydration can range from mild to very bad. It should be treated right away to keep it from getting very bad. Symptoms of mild dehydration may include:  Thirst.  Dry lips.  Slightly dry mouth.  Dry, warm skin.  Dizziness. Symptoms of moderate dehydration may include:  Very dry mouth.  Muscle cramps.  Dark pee (urine). Pee may be the color of tea.  Your body making less pee.  Your eyes making fewer tears.  Heartbeat that is uneven or faster than normal (palpitations).  Headache.  Light-headedness, especially when you stand up from sitting.  Fainting (syncope). Symptoms of very bad dehydration may include:  Changes in skin, such as: ? Cold and clammy skin. ? Blotchy (mottled) or pale skin. ? Skin that does not quickly return to normal after being lightly pinched and let go (poor skin turgor).  Changes in body fluids, such as: ? Feeling very thirsty. ? Your eyes making fewer tears. ? Not sweating when body temperature is high, such as in hot weather. ? Your body making very little pee.  Changes in vital signs, such as: ? Weak pulse. ? Pulse that is more than 100 beats a minute when you are sitting still. ? Fast breathing. ? Low blood pressure.  Other changes, such as: ? Sunken eyes. ? Cold hands and feet. ? Confusion. ? Lack of energy (lethargy). ? Trouble waking up from sleep. ? Short-term weight loss. ? Unconsciousness. Follow these instructions at home:  If told by your doctor, drink an ORS: ? Make an ORS by using instructions on the package. ? Start by drinking small amounts, about  cup (120 mL) every 5-10 minutes. ? Slowly drink more until you have had the amount that your doctor said to have.  Drink enough clear fluid to keep your pee clear or pale yellow. If you were told to drink an ORS, finish the ORS  first, then start slowly drinking clear fluids. Drink fluids such as: ? Water. Do not drink only water by itself. Doing that can make the salt (sodium) level in your body get too low (hyponatremia). ? Ice chips. ? Fruit juice that you have added water to (diluted). ? Low-calorie sports drinks.  Avoid: ? Alcohol. ? Drinks that have a lot of sugar. These include high-calorie sports drinks, fruit juice that does not have water added, and soda. ? Caffeine. ? Foods that are greasy or have a lot of fat or sugar.  Take over-the-counter and prescription medicines only as told by your doctor.  Do not take salt tablets. Doing that can make the salt level in your body get too high (hypernatremia).  Eat foods that have minerals (electrolytes). Examples include bananas, oranges, potatoes, tomatoes, and spinach.  Keep all follow-up visits as told by your doctor. This is important. Contact a doctor if:  You have belly (abdominal) pain that: ? Gets worse. ? Stays in one area (localizes).  You have a rash.  You have a stiff neck.  You get angry or annoyed more easily than normal (irritability).  You are more sleepy than normal.  You have a harder time waking up than normal.  You feel: ? Weak. ? Dizzy. ? Very thirsty.  You have peed (urinated) only a small amount of very dark pee during 6-8 hours. Get help right away if:  You have symptoms of  very bad dehydration.  You cannot drink fluids without throwing up (vomiting).  Your symptoms get worse with treatment.  You have a fever.  You have a very bad headache.  You are throwing up or having watery poop (diarrhea) and it: ? Gets worse. ? Does not go away.  You have blood or something green (bile) in your throw-up.  You have blood in your poop (stool). This may cause poop to look black and tarry.  You have not peed in 6-8 hours.  You pass out (faint).  Your heart rate when you are sitting still is more than 100 beats a  minute.  You have trouble breathing. This information is not intended to replace advice given to you by your health care provider. Make sure you discuss any questions you have with your health care provider. Document Released: 01/26/2009 Document Revised: 10/20/2015 Document Reviewed: 05/26/2015 Elsevier Interactive Patient Education  2018 [employer].   Neutropenia Neutropenia is a condition that occurs when you have a lower-than-normal level of a type of white blood cell (neutrophil) in your body. Neutrophils are made in the spongy center of large bones (bone marrow) and they fight infections. Neutrophils are your body's main defense against bacterial and fungal infections. The fewer neutrophils you have and the longer your body remains without them, the greater your risk of getting a severe infection. What are the causes? This condition can occur if your body uses up or destroys neutrophils faster than your bone marrow can make them. This problem may happen because of:  Bacterial or fungal infection.  Allergic disorders.  Reactions to some medicines.  Autoimmune disease.  An enlarged spleen.  This condition can also occur if your bone marrow does not produce enough neutrophils. This problem may be caused by:  Cancer.  Cancer treatments, such as radiation or chemotherapy.  Viral infections.  Medicines, such as phenytoin.  Vitamin B12 deficiency.  Diseases of the bone marrow.  Environmental toxins, such as insecticides.  What are the signs or symptoms? This condition does not usually cause symptoms. If symptoms are present, they are usually caused by an underlying infection. Symptoms of an infection may include:  Fever.  Chills.  Swollen glands.  Oral or anal ulcers.  Cough and shortness of breath.  Rash.  Skin infection.  Fatigue.  How is this diagnosed? Your health care provider may suspect neutropenia if you have:  A condition that may cause  neutropenia.  Symptoms of infection, especially fever.  Frequent and unusual infections.  You will have a medical history and physical exam. Tests will also be done, such as:  A complete blood count (CBC).  A procedure to collect a sample of bone marrow for examination (bone marrow biopsy).  A chest X-ray.  A urine culture.  A blood culture.  How is this treated? Treatment depends on the underlying cause and severity of your condition. Mild neutropenia may not require treatment. Treatment may include medicines, such as:  Antibiotic medicine given through an IV tube.  Antiviral medicines.  Antifungal medicines.  A medicine to increase neutrophil production (colony-stimulating factor). You may get this drug through an IV tube or by injection.  Steroids given through an IV tube.  If an underlying condition is causing neutropenia, you may need treatment for that condition. If medicines you are taking are causing neutropenia, your health care provider may have you stop taking those medicines. Follow these instructions at home: Medicines  Take over-the-counter and prescription medicines only as told  by your health care provider.  Get a seasonal flu shot (influenza vaccine). Lifestyle  Do not eat unpasteurized foods.Do not eat unwashed raw fruits or vegetables.  Avoid exposure to groups of people or children.  Avoid being around people who are sick.  Avoid being around dirt or dust, such as in construction areas or gardens.  Do not provide direct care for pets. Avoid animal droppings. Do not clean litter boxes and bird cages. Hygiene   Bathe daily.  Clean the area between the genitals and the anus (perineal area) after you urinate or have a bowel movement. If you are female, wipe from front to back.  Brush your teeth with a soft toothbrush before and after meals.  Do not use a razor that has a blade. Use an electric razor to remove hair.  Wash your hands often.  Make sure others who come in contact with you also wash their hands. If soap and water are not available, use hand sanitizer. General instructions  Do not have sex unless your health care provider has approved.  Take actions to avoid cuts and burns. For example: ? Be cautious when you use knives. Always cut away from yourself. ? Keep knives in protective sheaths or guards when not in use. ? Use oven mitts when you cook with a hot stove, oven, or grill. ? Stand a safe distance away from open fires.  Avoid people who received a vaccine in the past 30 days if that vaccine contained a live version of the germ (live vaccine). You should not get a live vaccine. Common live vaccines are varicella, measles, mumps, and rubella.  Do not share food utensils.  Do not use tampons, enemas, or rectal suppositories unless your health care provider has approved.  Keep all appointments as told by your health care provider. This is important. Contact a health care provider if:  You have a fever.  You have chills or you start to shake.  You have: ? A sore throat. ? A warm, red, or tender area on your skin. ? A cough. ? Frequent or painful urination. ? Vaginal discharge or itching.  You develop: ? Sores in your mouth or anus. ? Swollen lymph nodes. ? Red streaks on the skin. ? A rash.  You feel: ? Nauseous or you vomit. ? Very fatigued. ? Short of breath. This information is not intended to replace advice given to you by your health care provider. Make sure you discuss any questions you have with your health care provider. Document Released: 09/21/2001 Document Revised: 09/07/2015 Document Reviewed: 10/12/2014 Elsevier Interactive Patient Education  [employer].

## 2017-09-05 NOTE — Progress Notes (Signed)
Pt ANC 0.1. Granix today and tomorrow per Regan Rakers, NP. Pt made aware of appointments to be scheduled and educated regarding neutropenic precautions. Pt potassium reconfirmed at 3.4 and pt encouraged to increase oral intake of potassium through foods and monitor diarrhea.

## 2017-09-05 NOTE — Telephone Encounter (Signed)
Spoke to patient regarding upcoming may appointments per 5/25 sch message.

## 2017-09-05 NOTE — Progress Notes (Signed)
Potassium 3.0 received order for IV potassium from [name] NP

## 2017-09-05 NOTE — Progress Notes (Signed)
[facility]  [phone] [phone]  Clinic Follow up Note   Patient Care Team: Lucianne Lei, MD as PCP - General (Family Medicine) Truitt Merle, MD as Consulting Physician (Hematology) [name], MD as Consulting Physician (General Surgery) [name], MD as Attending Physician (Radiation Oncology) 09/05/2017  SUMMARY OF ONCOLOGIC HISTORY: Oncology History   Cancer Staging Malignant neoplasm of upper-inner quadrant of right breast in female, estrogen receptor positive ([place]) Staging form: Breast, AJCC 8th Edition - Clinical stage from 06/26/2017: Stage IIA (cT2, cN0, cM0, G3, ER: Positive, PR: Positive, HER2: Negative) - Signed by Truitt Merle, MD on 07/01/2017 - Pathologic stage from 07/08/2017: Stage IB (pT2, pN0, cM0, G3, ER+, PR+, HER2-, Oncotype DX score: 29) - Signed by [name], NP on 07/25/2017       Malignant neoplasm of upper-inner quadrant of right breast in female, estrogen receptor positive ([place])   06/23/2017 Mammogram    IMPRESSION: 1. There is a highly suspicious mass measuring 3.5 cm mammographically in the right breast at the palpable site identified by the patient. 2. There is 1 suspicious lymph node with cortex measuring up to 6 mm. 3.  No mammographic evidence of malignancy in the left breast.      06/26/2017 Initial Biopsy    Diagnosis 06/26/17 1. Breast, right, needle core biopsy, upper inner quadrant, 12:30 o'clock, 7cm from nipple - INVASIVE DUCTAL CARCINOMA - SEE COMMENT 2. Lymph node, needle/core biopsy, right axillary (level 2 node) - NO CARCINOMA IDENTIFIED IN ONE LYMPH NODE (0/1)      06/26/2017 Receptors her2    Prognostic indicators significant for: ER, 70% positive and PR, 70% positive, both with strong staining intensity. Proliferation marker Ki67 at 60%. HER2 negative.      07/01/2017 Initial Diagnosis    Malignant neoplasm of upper-inner quadrant of right breast in female, estrogen receptor  positive ([place])      07/08/2017 Pathology Results    Diagnosis 1. Breast, lumpectomy, Right - INVASIVE DUCTAL CARCINOMA, NOTTINGHAM GRADE 3 OF 3, 3.5 CM - MARGINS UNINVOLVED BY CARCINOMA (0.1 CM, SUPERIOR MARGIN) - PREVIOUS BIOPSY SITE CHANGES - SEE ONCOLOGY TABLE BELOW 2. Soft tissue, biopsy, Axillary - BENIGN FIBROADIPOSE TISSUE - NO MALIGNANCY IDENTIFIED 3. Lymph node, sentinel, biopsy, Right axillary - NO CARCINOMA IDENTIFIED IN ONE LYMPH NODE (0/1) 4. Lymph node, sentinel, biopsy, Right axillary - NO CARCINOMA IDENTIFIED IN ONE LYMPH NODE (0/1) - PREVIOUS BIOPSY SITE CHANGES - SEE COMMENT 5. Breast, excision, Right superior margin - BENIGN BREAST TISSUE - NO RESIDUAL CARCINOMA IDENTIFIED 6. Breast, excision, Right inferior margin - BENIGN BREAST TISSUE - NO RESIDUAL CARCINOMA IDENTIFIED      07/08/2017 Cancer Staging    Staging form: Breast, AJCC 8th Edition - Pathologic stage from 07/08/2017: Stage IB (pT2, pN0, cM0, G3, ER+, PR+, HER2-, Oncotype DX score: 29) - Signed by [name], NP on 07/25/2017      07/22/2017 Oncotype testing    Recurrence Score: 29 Distant Recurrence Risk at 9 years with Ai or Tamoxifen alone: 18% Absolute Chemotherapy Benefit: > 15 %      08/07/2017 -  Chemotherapy    Adjuvant chemo Taxotere/Cytoxanq3 weeks x4 cycles with Udenyca     CURRENT THERAPY: AdjuvantTaxotere/Cytoxanq3 weeks x4 cycles with Udenyca, started 08/08/17  INTERVAL HISTORY: Ms. Krizek returns for follow up as scheduled 1 week following cycle 2 TC. neulasta was not given with cycle 2, however she continues to have bone/joint pain not controlled with tylenol, claritin, and  tramadol. Dr. Burr Medico prescribed tylenol #3 and cymbalta, which have helped some. She has mild fatigue. Appetite has been low for the past few days, she lost 14 lbs by our scale. She reports only eating small amounts twice per day but is drinking well. She developed mild diarrhea last night, has had 3  episodes in 24 hours. Imodium helps. 1 day ago at work she developed nausea with lightheadedness and dizziness, think she got "overheated" with near syncope. She had taken zofran recently. She improved with rest and cold compress. She has numbness to bottoms of feet and sensitivity to tips of fingers. She denies recent fever, chills, cough, chest pain, dyspnea, or vomiting.   REVIEW OF SYSTEMS:   Constitutional: Denies fevers, chills (+) abnormal weight loss (+) decreased appetite days 2-4 after chemo  Eyes: Denies blurriness of vision Ears, nose, mouth, throat, and face: Denies mucositis or sore throat Respiratory: Denies cough, dyspnea or wheezes  Cardiovascular: Denies palpitation, chest discomfort or lower extremity swelling Gastrointestinal:  Denies vomiting, constipation, heartburn (+) mild intermittent nausea (+) intermittent diarrhea, 3 episodes in 24 hours  Skin: Denies abnormal skin rashes Lymphatics: Denies new lymphadenopathy or easy bruising Neurological:Denies numbness, tingling or new weaknesses (+) numbness to bottoms of feet (+) 1 episodes lightheaded/dizzy after becoming "overheated" at work  Behavioral/Psych: Mood is stable, no new changes  MSK: (+) moderate bone pain after chemo, arms and legs  All other systems were reviewed with the patient and are negative.  MEDICAL HISTORY:  Past Medical History:  Diagnosis Date  . Anemia   . Anxiety   . Cancer [facility])    breast cancer right  . Depression   . GERD (gastroesophageal reflux disease)   . Headache     SURGICAL HISTORY: Past Surgical History:  Procedure Laterality Date  . CERVICAL CONE BIOPSY    . CHOLECYSTECTOMY    . MASTECTOMY WITH RADIOACTIVE SEED GUIDED EXCISION AND AXILLARY SENTINEL LYMPH NODE BIOPSY Right 07/08/2017   Procedure: RIGHT LUMPTECTOMY WITH RADIOACTIVE SEED GUIDED EXCISION AND RIGHT AXILLARY SENTINEL LYMPH NODE BIOPSY ERAS PATHWAY;  Surgeon: [name], MD;  Location: [facility];  Service:  General;  Laterality: Right;  PEC BLOCK  . OPEN REDUCTION INTERNAL FIXATION (ORIF) DISTAL RADIAL FRACTURE  03/17/2012   Procedure: OPEN REDUCTION INTERNAL FIXATION (ORIF) DISTAL RADIAL FRACTURE;  Surgeon: Roseanne Kaufman, MD;  Location: [facility];  Service: Orthopedics;  Laterality: Right;  Pre-operative a supra-clavical block right arm in addition to general anesthesia  . PORTACATH PLACEMENT N/A 07/30/2017   Procedure: INSERTION PORT-A-CATH WITH [place];  Surgeon: [name], MD;  Location: [facility];  Service: General;  Laterality: N/A;  . TUBAL LIGATION      I have reviewed the social history and family history with the patient and they are unchanged from previous note.  ALLERGIES:  has No Known Allergies.  MEDICATIONS:  Current Outpatient Medications  Medication Sig Dispense Refill  . acetaminophen-codeine (TYLENOL #3) 300-30 MG tablet Take 1 tablet by mouth every 8 (eight) hours as needed for moderate pain. 15 tablet 0  . dexamethasone (DECADRON) 4 MG tablet Take 1 tablet (4 mg total) by mouth 2 (two) times daily with a meal. Take one tablet twice a day - day before chemo and day after chemo 6 tablet 0  . DULoxetine (CYMBALTA) 30 MG capsule Take 1 capsule (30 mg total) by mouth daily. 20 capsule 0  . ibuprofen (ADVIL,MOTRIN) 200 MG tablet Take 400 mg by mouth daily as needed for mild pain  or moderate pain.    [name] lidocaine-prilocaine (EMLA) cream Apply to affected area once 30 g 3  . ondansetron (ZOFRAN) 8 MG tablet Take 1 tablet (8 mg total) by mouth every 8 (eight) hours as needed for nausea or vomiting. 30 tablet 2  . prochlorperazine (COMPAZINE) 10 MG tablet Take 1 tablet (10 mg total) by mouth every 6 (six) hours as needed for nausea or vomiting. 30 tablet 2  . metFORMIN (GLUCOPHAGE) 500 MG tablet Take 1 tablet (500 mg total) by mouth 2 (two) times daily with a meal. 60 tablet 0  . traMADol (ULTRAM) 50 MG tablet Take 1 tablet (50 mg total) by mouth every 6 (six) hours as needed. 30 tablet 0    Current Facility-Administered Medications  Medication Dose Route Frequency Provider Last Rate Last Dose  . 0.9 %  sodium chloride infusion   Intravenous Once [name], NP       Facility-Administered Medications Ordered in Other Visits  Medication Dose Route Frequency Provider Last Rate Last Dose  . alteplase (CATHFLO ACTIVASE) injection 2 mg  2 mg Intracatheter Once PRN Truitt Merle, MD        PHYSICAL EXAMINATION: ECOG PERFORMANCE STATUS: 1 - Symptomatic but completely ambulatory  Vitals:   09/05/17 1312  BP: 134/81  Pulse: (!) 103  Resp: 18  Temp: 98.1 F (36.7 C)  SpO2: 100%   Filed Weights   09/05/17 1312  Weight: (!) 332 lb 12.8 oz (151 kg)    GENERAL:alert, no distress and comfortable SKIN: no rashes or significant lesions EYES: normal, Conjunctiva are pink and non-injected, sclera clear OROPHARYNX:no thrush or ulcers   LYMPH:  no palpable cervical or supraclavicular lymphadenopathy  LUNGS: clear to auscultation with normal breathing effort HEART: regular rate & rhythm and no murmurs and no lower extremity edema ABDOMEN:abdomen soft, non-tender and normal bowel sounds Musculoskeletal:no cyanosis of digits and no clubbing  NEURO: alert & oriented x 3 with fluent speech, steady gait. No peripheral sensory deficits per tuning fork exam.  PAC without erythema   LABORATORY DATA:  I have reviewed the data as listed CBC Latest Ref Rng & Units 09/05/2017 08/29/2017 08/14/2017  WBC 3.9 - 10.3 K/uL 1.2(L) 8.7 2.2(L)  Hemoglobin 11.6 - 15.9 g/dL 11.6 12.3 12.9  Hematocrit 34.8 - 46.6 % 35.3 37.2 38.7  Platelets 145 - 400 K/uL 250 398 176     CMP Latest Ref Rng & Units 09/05/2017 08/29/2017 08/14/2017  Glucose 65 - 99 mg/dL 131(H) 239(H) 170(H)  BUN 6 - 20 mg/dL 14 15 8   Creatinine 0.44 - 1.00 mg/dL 0.76 0.89 0.82  Sodium 135 - 145 mmol/L 135 137 139  Potassium 3.5 - 5.1 mmol/L 3.4(L) 3.8 3.3(L)  Chloride 101 - 111 mmol/L 100(L) 106 106  CO2 22 - 32 mmol/L 24 22 24    Calcium 8.9 - 10.3 mg/dL 8.9 9.5 9.2  Total Protein 6.5 - 8.1 g/dL 7.8 7.7 7.3  Total Bilirubin 0.3 - 1.2 mg/dL 0.4 0.3 0.5  Alkaline Phos 38 - 126 U/L 73 79 92  AST 15 - 41 U/L 31 19 24   ALT 14 - 54 U/L 35 24 29   PATHOLOGY  Diagnosis 06/26/17 1. Breast, right, needle core biopsy, upper inner quadrant, 12:30 o'clock, 7cm from nipple - INVASIVE DUCTAL CARCINOMA - SEE COMMENT 2. Lymph node, needle/core biopsy, right axillary (level 2 node) - NO CARCINOMA IDENTIFIED IN ONE LYMPH NODE (0/1) Results: HER2 - NEGATIVE RATIO OF HER2/CEP17 SIGNALS 1.21 AVERAGE HER2 COPY  NUMBER PER CELL 2.00 Results: IMMUNOHISTOCHEMICAL AND MORPHOMETRIC ANALYSIS PERFORMED MANUALLY Estrogen Receptor: 70%, POSITIVE, STRONG STAINING INTENSITY Progesterone Receptor: 70%, POSITIVE, STRONG STAINING INTENSITY Proliferation Marker Ki67: 60%    RADIOGRAPHIC STUDIES: I have personally reviewed the radiological images as listed and agreed with the findings in the report. No results found.   ASSESSMENT & PLAN: 52 y.o. pre-menopausal woman, presented with screening discovered to Ductal carcinoma  1.  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 Lizardo appears stable.  She completed cycle 2 TC on 08/29/2017.  He had more difficulty with cycle 2.  Had increased 14 pounds weight loss, however diarrhea has been less frequent with cycle 2.  She continues to have bone pain secondary to Taxotere; continue Tylenol 3 and Cymbalta.  Labs reviewed, she is neutropenic ANC 0.1 she will receive Granix today and 09/06/2017.  We reviewed neutropenic precautions. Follow-up in 2 weeks with cycle 3  2.  Weight loss -She lost 14 pounds in 1 week after cycle 2.  I encouraged her to eat small frequent amounts and take antiemetics 30-60 minutes prior to p.o. intake to control nausea and help promote eating.  I  recommend she Glucerna nutrition supplement to add calories and prevent further weight loss.  I encouraged her to avoid high sugar and carb intake related to elevated blood sugar.  Refer her to dietitian today for further management.   3. Depression, anxiety -Mood is stable  4. Steroid induced hyperglycemia  -We reviewed low-carb diet.  BG improved today, 131, however she has been off steroids this week.  Hgb A1c 6.7.  I discussed side effects of metformin which include possible GI upset with diarrhea.  If she experiences will change to long-acting, she will call to notify.  I will send prescription today to start 1 tab BID.   5. GCSF induced bone pain  -she still has significant bone pain without neulasta with cycle 2, pain is likely related to docetaxel. She started tylenol #3 and cymbalta, taking both PRN. I reviewed to take cymbalta daily routinely and use tylenol #3 PRN. She agrees.  -She will get granix, hopefully bone pain will be less   6. Chemotherapy-induced neutropenia -She did not get neulasta with cycle 2 due to significant bone pain. ANC 0.1 at her 1 week f/u. Will arrange granix 480 mcg today and tomorrow (5/24 and 5/25). We discussed neutropenic precautions.   7.  Hypokalemia K3.4 today, likely slightly decreased due to recent diarrhea.  Encouraged her to take Imodium as prescribed to control diarrhea and prevent further electrode imbalance.  She will increase potassium intake in her diet.  PLAN -1 L NS over 2 hours today -ANC 0.1, granix today and 5/25, reviewed neutropenic precautions -anti-emetics 30-60 mins before meals to control nausea and improve po intake -start glucerna for nutrition supplement  -take cymbalta daily rather than PRN for pain and neuropathy; start B complex/B12 vitamin  -refer to dietician for weight loss on chemotherapy  -Start Metformin 1 tablet BID, prescription sent to pharmacy   Orders Placed This Encounter  Procedures  . Ambulatory referral  to Nutrition and Diabetic [facility]    Referral Priority:   Routine    Referral Type:   Consultation    Referral Reason:   Specialty Services Required    Number of Visits Requested:   1   All questions were answered.  The patient knows to call the clinic with any problems, questions or concerns. No barriers to learning was detected. I spent 20 minutes counseling the patient face to face. The total time spent in the appointment was 25 minutes and more than 50% was on counseling and review of test results     [name], NP 09/05/17

## 2017-09-06 ENCOUNTER — Inpatient Hospital Stay: Payer: BLUE CROSS/BLUE SHIELD

## 2017-09-06 VITALS — BP 124/74 | HR 114 | Temp 99.3°F | Resp 12

## 2017-09-06 DIAGNOSIS — Z95828 Presence of other vascular implants and grafts: Secondary | ICD-10-CM

## 2017-09-06 DIAGNOSIS — Z5111 Encounter for antineoplastic chemotherapy: Secondary | ICD-10-CM | POA: Diagnosis not present

## 2017-09-06 MED ORDER — TBO-FILGRASTIM 480 MCG/0.8ML ~~LOC~~ SOSY
480.0000 ug | PREFILLED_SYRINGE | Freq: Once | SUBCUTANEOUS | Status: AC
  Administered 2017-09-06: 480 ug via SUBCUTANEOUS

## 2017-09-06 MED ORDER — TBO-FILGRASTIM 480 MCG/0.8ML ~~LOC~~ SOSY
PREFILLED_SYRINGE | SUBCUTANEOUS | Status: AC
  Filled 2017-09-06: qty 0.8

## 2017-09-06 NOTE — Patient Instructions (Addendum)
Tbo-Filgrastim injection What is this medicine? TBO-FILGRASTIM (T B O fil GRA stim) is a granulocyte colony-stimulating factor that stimulates the growth of neutrophils, a type of white blood cell important in the body's fight against infection. It is used to reduce the incidence of fever and infection in patients with certain types of cancer who are receiving chemotherapy that affects the bone marrow. This medicine may be used for other purposes; ask your health care provider or pharmacist if you have questions. COMMON BRAND NAME(S): Granix What should I tell my health care provider before I take this medicine? They need to know if you have any of these conditions: -bone scan or tests planned -kidney disease -sickle cell anemia -an unusual or allergic reaction to tbo-filgrastim, filgrastim, pegfilgrastim, other medicines, foods, dyes, or preservatives -pregnant or trying to get pregnant -breast-feeding How should I use this medicine? This medicine is for injection under the skin. If you get this medicine at home, you will be taught how to prepare and give this medicine. Refer to the Instructions for Use that come with your medication packaging. Use exactly as directed. Take your medicine at regular intervals. Do not take your medicine more often than directed. It is important that you put your used needles and syringes in a special sharps container. Do not put them in a trash can. If you do not have a sharps container, call your pharmacist or healthcare provider to get one. Talk to your pediatrician regarding the use of this medicine in children. Special care may be needed. Overdosage: If you think you have taken too much of this medicine contact a poison control center or emergency room at once. NOTE: This medicine is only for you. Do not share this medicine with others. What if I miss a dose? It is important not to miss your dose. Call your doctor or health care professional if you miss a  dose. What may interact with this medicine? This medicine may interact with the following medications: -medicines that may cause a release of neutrophils, such as lithium This list may not describe all possible interactions. Give your health care provider a list of all the medicines, herbs, non-prescription drugs, or dietary supplements you use. Also tell them if you smoke, drink alcohol, or use illegal drugs. Some items may interact with your medicine. What should I watch for while using this medicine? You may need blood work done while you are taking this medicine. What side effects may I notice from receiving this medicine? Side effects that you should report to your doctor or health care professional as soon as possible: -allergic reactions like skin rash, itching or hives, swelling of the face, lips, or tongue -blood in the urine -dark urine -dizziness -fast heartbeat -feeling faint -shortness of breath or breathing problems -signs and symptoms of infection like fever or chills; cough; or sore throat -signs and symptoms of kidney injury like trouble passing urine or change in the amount of urine -stomach or side pain, or pain at the shoulder -sweating -swelling of the legs, ankles, or abdomen -tiredness Side effects that usually do not require medical attention (report to your doctor or health care professional if they continue or are bothersome): -bone pain -headache -muscle pain -vomiting This list may not describe all possible side effects. Call your doctor for medical advice about side effects. You may report side effects to FDA at 1-800-FDA-1088. Where should I keep my medicine? Keep out of the reach of children. Store in a refrigerator between   2 and 8 degrees C (36 and 46 degrees F). Keep in carton to protect from light. Throw away this medicine if it is left out of the refrigerator for more than 5 consecutive days. Throw away any unused medicine after the expiration  date. NOTE: This sheet is a summary. It may not cover all possible information. If you have questions about this medicine, talk to your doctor, pharmacist, or health care provider.  2018 Elsevier/Gold Standard (2015-05-22 19:07:04)  

## 2017-09-10 ENCOUNTER — Telehealth: Payer: Self-pay | Admitting: Hematology

## 2017-09-10 NOTE — Telephone Encounter (Signed)
Scheduled nutrition consult per 5/28 sch message - pt is aware of appt date and time.

## 2017-09-16 ENCOUNTER — Inpatient Hospital Stay: Payer: BLUE CROSS/BLUE SHIELD | Admitting: Nutrition

## 2017-09-16 ENCOUNTER — Telehealth: Payer: Self-pay | Admitting: Hematology

## 2017-09-16 ENCOUNTER — Encounter: Payer: Self-pay | Admitting: Nutrition

## 2017-09-16 NOTE — Telephone Encounter (Signed)
Patient called to reschedule due to car trouble  °

## 2017-09-16 NOTE — Progress Notes (Signed)
Patient did not show up for nutrition appointment. If desired, patient should be rescheduled with nutrition and [facility] Education Services since appointment was for Decreased CHO diet secondary to increased CBG. (Hga1C - 6.7)

## 2017-09-17 NOTE — Progress Notes (Signed)
[place]  Telephone:([phone]) [phone] Fax:(336) [phone]  Clinic Follow up Note   Patient Care Team: Lucianne Lei, MD as PCP - General (Family Medicine) Truitt Merle, MD as Consulting Physician (Hematology) [name], MD as Consulting Physician (General Surgery) [name], MD as Attending Physician (Radiation Oncology)   Date of Service:  09/19/2017  CHIEF COMPLAINTS:  Follow up for ductal carcinoma of the right breast, on adjuvant chemo   Oncology History   Cancer Staging Malignant neoplasm of upper-inner quadrant of right breast in female, estrogen receptor positive ([place]) Staging form: Breast, AJCC 8th Edition - Clinical stage from 06/26/2017: Stage IIA (cT2, cN0, cM0, G3, ER: Positive, PR: Positive, HER2: Negative) - Signed by Truitt Merle, MD on 07/01/2017 - Pathologic stage from 07/08/2017: Stage IB (pT2, pN0, cM0, G3, ER+, PR+, HER2-, Oncotype DX score: 29) - Signed by [name], NP on 07/25/2017       Malignant neoplasm of upper-inner quadrant of right breast in female, estrogen receptor positive ([place])   06/23/2017 Mammogram    IMPRESSION: 1. There is a highly suspicious mass measuring 3.5 cm mammographically in the right breast at the palpable site identified by the patient. 2. There is 1 suspicious lymph node with cortex measuring up to 6 mm. 3.  No mammographic evidence of malignancy in the left breast.      06/26/2017 Initial Biopsy    Diagnosis 06/26/17 1. Breast, right, needle core biopsy, upper inner quadrant, 12:30 o'clock, 7cm from nipple - INVASIVE DUCTAL CARCINOMA - SEE COMMENT 2. Lymph node, needle/core biopsy, right axillary (level 2 node) - NO CARCINOMA IDENTIFIED IN ONE LYMPH NODE (0/1)      06/26/2017 Receptors her2    Prognostic indicators significant for: ER, 70% positive and PR, 70% positive, both with strong staining intensity. Proliferation marker Ki67 at 60%. HER2 negative.      07/01/2017 Initial Diagnosis   Malignant neoplasm of upper-inner quadrant of right breast in female, estrogen receptor positive ([place])      07/08/2017 Pathology Results    Diagnosis 1. Breast, lumpectomy, Right - INVASIVE DUCTAL CARCINOMA, NOTTINGHAM GRADE 3 OF 3, 3.5 CM - MARGINS UNINVOLVED BY CARCINOMA (0.1 CM, SUPERIOR MARGIN) - PREVIOUS BIOPSY SITE CHANGES - SEE ONCOLOGY TABLE BELOW 2. Soft tissue, biopsy, Axillary - BENIGN FIBROADIPOSE TISSUE - NO MALIGNANCY IDENTIFIED 3. Lymph node, sentinel, biopsy, Right axillary - NO CARCINOMA IDENTIFIED IN ONE LYMPH NODE (0/1) 4. Lymph node, sentinel, biopsy, Right axillary - NO CARCINOMA IDENTIFIED IN ONE LYMPH NODE (0/1) - PREVIOUS BIOPSY SITE CHANGES - SEE COMMENT 5. Breast, excision, Right superior margin - BENIGN BREAST TISSUE - NO RESIDUAL CARCINOMA IDENTIFIED 6. Breast, excision, Right inferior margin - BENIGN BREAST TISSUE - NO RESIDUAL CARCINOMA IDENTIFIED      07/08/2017 Cancer Staging    Staging form: Breast, AJCC 8th Edition - Pathologic stage from 07/08/2017: Stage IB (pT2, pN0, cM0, G3, ER+, PR+, HER2-, Oncotype DX score: 29) - Signed by [name], NP on 07/25/2017      07/22/2017 Oncotype testing    Recurrence Score: 29 Distant Recurrence Risk at 9 years with Ai or Tamoxifen alone: 18% Absolute Chemotherapy Benefit: > 15 %      08/07/2017 -  Chemotherapy    Adjuvant chemo Taxotere/Cytoxanq3 weeks x4 cycles with Udenyca       HISTORY OF PRESENTING ILLNESS:  Caitlin Cohen 52 y.o. female is a here because of newly diagnosed breast cancer. The patient was referred by Dr. Elly Modena. The  patient presents to the clinic today by herself.  Prior to pt's abnormal mammogram she states she felt the lump which prompted her to get screening 1 month ago. Her last mammogram was in 2008 and was otherwise normal.   Pt's mammogram from 06/23/17 revealed a highly suspicious mass measuring 3.5 cm mammographically in the right breast at the palpable site  identified by the patient.There was also a 1 suspicious lymph node with cortex measuring up to 6 Mm. Her biopsy confirmed invasive ductal carcinoma and had reassuring results of no carcinoma identified in the lymph node.   Prognostic indicators significant for: ER, 70% positive and PR, 70% positive, both with strong staining intensity. Proliferation marker Ki67 at 60%. HER2 negative.  GYN HISTORY  Menarchal: xx LMP: xx Contraceptive: not currently, oral birth control in the past  HRT:  GP: 3:3 first at 52 years old   Today the patient notes she does have some pain due to biopsy. Pt denies hot flash. She still has a period but it is irregular. Her last period was over 1 month ago.    Pt has a PMHx of Depression for which she use to take medication. She states she is not feeling overwhelmed right now just mildly anxious. She has a past surgical history of cholecystectomy, tubal ligation, right wrist fracture. She reports she previously had part of her cervix removed due to discovery of cancer cells.   Pt does not have any FHx of Cancer that she knows of. Pt denies tobacco use and she drinks alcohol occasionally.   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 All three of her children live in [place].    CURRENT THERAPY: Adjuvant Taxotere/Cytoxan q3 weeks x4 cycles with Udenyca, started 08/08/17     -Granix added from cycle 2. Discontinued Udenyca following cycle 2.    INTERVAL HISTORY  Caitlin Cohen is here for a follow up and cycle 3 TC. She was last seen by me in 08/2017. She is accompanied by a family member today. She is doing well overall. She notes that she had leg pain for 5 days following her last chemo cycle. She is taking her medications as prescribed. She is out of tramadol and tylenol #3 and she notes that the tylenol #3 works best for her pain. She takes ibuprofen during the day and tylenol #3 at night only. She also needs cymbalta and her nausea medications.   On review  of systems, she reports increased appetite, weight gain, diarrhea x 4-5 episodes for about 4-5 days (treated with imodium). She doesn't have a prescription for lomotil at this time. she denies any other symptoms. Pertinent positives are listed and detailed within the above HPI.    MEDICAL HISTORY:  Past Medical History:  Diagnosis Date  . Anemia   . Anxiety   . Cancer [facility])    breast cancer right  . Depression   . GERD (gastroesophageal reflux disease)   . Headache     SURGICAL HISTORY: Past Surgical History:  Procedure Laterality Date  . CERVICAL CONE BIOPSY    . CHOLECYSTECTOMY    . MASTECTOMY WITH RADIOACTIVE SEED GUIDED EXCISION AND AXILLARY SENTINEL LYMPH NODE BIOPSY Right 07/08/2017   Procedure: RIGHT LUMPTECTOMY WITH RADIOACTIVE SEED GUIDED EXCISION AND RIGHT AXILLARY SENTINEL LYMPH NODE BIOPSY ERAS PATHWAY;  Surgeon: [name], MD;  Location: [facility];  Service: General;  Laterality: Right;  PEC BLOCK  . OPEN REDUCTION INTERNAL FIXATION (ORIF) DISTAL RADIAL FRACTURE  03/17/2012  Procedure: OPEN REDUCTION INTERNAL FIXATION (ORIF) DISTAL RADIAL FRACTURE;  Surgeon: Roseanne Kaufman, MD;  Location: [facility];  Service: Orthopedics;  Laterality: Right;  Pre-operative a supra-clavical block right arm in addition to general anesthesia  . PORTACATH PLACEMENT N/A 07/30/2017   Procedure: INSERTION PORT-A-CATH WITH [place];  Surgeon: [name], MD;  Location: [facility];  Service: General;  Laterality: N/A;  . TUBAL LIGATION      SOCIAL HISTORY: Social History   Socioeconomic History  . Marital status: Single    Spouse name: Not on file  . Number of children: Not on file  . Years of education: Not on file  . Highest education level: Not on file  Occupational History  . Not on file  Social Needs  . Financial resource strain: Not on file  . Food insecurity:    Worry: Not on file    Inability: Not on file  . Transportation needs:    Medical: Not on file    Non-medical: Not on  file  Tobacco Use  . Smoking status: Never Smoker  . Smokeless tobacco: Never Used  Substance and Sexual Activity  . Alcohol use: Yes    Comment: occassionally  . Drug use: No  . Sexual activity: Yes    Birth control/protection: Surgical  Lifestyle  . Physical activity:    Days per week: 2 days    Minutes per session: 30 min  . Stress: Only a little  Relationships  . Social connections:    Talks on phone: More than three times a week    Gets together: Once a week    Attends religious service: Never    Active member of club or organization: No    Attends meetings of clubs or organizations: Never    Relationship status: Never married  . Intimate partner violence:    Fear of current or ex partner: No    Emotionally abused: Yes    Physically abused: No    Forced sexual activity: No  Other Topics Concern  . Not on file  Social History Narrative   Lives with roommate   Works fulltime but seasonal work    FAMILY HISTORY: Family History  Problem Relation Age of Onset  . Hypertension Mother   . Diabetes Father   . Hypertension Father     ALLERGIES:  has No Known Allergies.  MEDICATIONS:  Current Outpatient Medications  Medication Sig Dispense Refill  . acetaminophen-codeine (TYLENOL #3) 300-30 MG tablet Take 1 tablet by mouth every 8 (eight) hours as needed for moderate pain. 15 tablet 0  . dexamethasone (DECADRON) 4 MG tablet Take 1 tablet (4 mg total) by mouth 2 (two) times daily with a meal. Take one tablet twice a day - day before chemo and day after chemo 6 tablet 0  . diphenoxylate-atropine (LOMOTIL) 2.5-0.025 MG tablet Take 1-2 tablets by mouth 4 (four) times daily as needed for diarrhea or loose stools. 30 tablet 1  . DULoxetine (CYMBALTA) 30 MG capsule Take 1 capsule (30 mg total) by mouth daily. 20 capsule 0  . ibuprofen (ADVIL,MOTRIN) 200 MG tablet Take 400 mg by mouth daily as needed for mild pain or moderate pain.    [name] lidocaine-prilocaine (EMLA) cream Apply  to affected area once 30 g 3  . metFORMIN (GLUCOPHAGE) 500 MG tablet Take 1 tablet (500 mg total) by mouth 2 (two) times daily with a meal. 60 tablet 0  . ondansetron (ZOFRAN) 8 MG tablet Take 1 tablet (8 mg total) by  mouth every 8 (eight) hours as needed for nausea or vomiting. 30 tablet 2  . prochlorperazine (COMPAZINE) 10 MG tablet Take 1 tablet (10 mg total) by mouth every 6 (six) hours as needed for nausea or vomiting. 30 tablet 2  . traMADol (ULTRAM) 50 MG tablet Take 1 tablet (50 mg total) by mouth every 6 (six) hours as needed. 30 tablet 0   No current facility-administered medications for this visit.    Facility-Administered Medications Ordered in Other Visits  Medication Dose Route Frequency Provider Last Rate Last Dose  . 0.9 %  sodium chloride infusion   Intravenous Once Truitt Merle, MD      . cyclophosphamide (CYTOXAN) 1,640 mg in sodium chloride 0.9 % 250 mL chemo infusion  600 mg/m2 (Treatment Plan Recorded) Intravenous Once Truitt Merle, MD      . DOCEtaxel (TAXOTERE) 210 mg in sodium chloride 0.9 % 500 mL chemo infusion  75 mg/m2 (Treatment Plan Recorded) Intravenous Once Truitt Merle, MD 521 mL/hr at 09/19/17 1123 210 mg at 09/19/17 1123  . heparin lock flush 100 unit/mL  500 Units Intracatheter Once PRN Truitt Merle, MD      . sodium chloride flush (NS) 0.9 % injection 10 mL  10 mL Intracatheter PRN Truitt Merle, MD        REVIEW OF SYSTEMS:   Constitutional: Denies fevers, chills or abnormal night sweats (+) hot flashes, manageable  Eyes: Denies blurriness of vision, double vision or watery eyes Ears, nose, mouth, throat, and face: Denies mucositis or sore throat Respiratory: Denies cough, dyspnea or wheezes Cardiovascular: Denies palpitation, chest discomfort or lower extremity swelling Gastrointestinal:  Denies nausea, heartburn or change in bowel habits Skin: Denies abnormal skin rashes MSK: (+) significant bone pain, especially in right lower leg Lymphatics: Denies new  lymphadenopathy or easy bruising Neurological:Denies numbness, tingling or new weaknesses Behavioral/Psych: Mood is stable, no new changes (+) Numbness of bottom of feet Breast: (+) right breast soreness from biopsy All other systems were reviewed with the patient and are negative.  PHYSICAL EXAMINATION:  ECOG PERFORMANCE STATUS: 1 Vitals:   09/19/17 1002  BP: (!) 176/97  Pulse: (!) 103  Resp: 18  Temp: 98.3 F (36.8 C)  TempSrc: Oral  SpO2: 98%  Weight: (!) 335 lb 6.4 oz (152.1 kg)  Height: 5' 7"  (1.702 m)    GENERAL:alert, no distress and comfortable SKIN: skin color, texture, turgor are normal, no rashes or significant lesions EYES: normal, conjunctiva are pink and non-injected, sclera clear OROPHARYNX:no exudate, no erythema and lips, buccal mucosa, and tongue normal  NECK: supple, thyroid normal size, non-tender, without nodularity LYMPH:  no palpable lymphadenopathy in the cervical, axillary or inguinal LUNGS: clear to auscultation and percussion with normal breathing effort HEART: regular rate & rhythm and no murmurs and no lower extremity edema ABDOMEN:abdomen soft, non-tender and normal bowel sounds Musculoskeletal:no cyanosis of digits and no clubbing  PSYCH: alert & oriented x 3 with fluent speech NEURO: no focal motor/sensory deficits Breast exam deferred today   LABORATORY DATA:  I have reviewed the data as listed CBC Latest Ref Rng & Units 09/19/2017 09/05/2017 08/29/2017  WBC 3.9 - 10.3 K/uL 10.3 1.2(L) 8.7  Hemoglobin 11.6 - 15.9 g/dL 11.6 11.6 12.3  Hematocrit 34.8 - 46.6 % 35.0 35.3 37.2  Platelets 145 - 400 K/uL 288 250 398    CMP Latest Ref Rng & Units 09/19/2017 09/05/2017 08/29/2017  Glucose 70 - 140 mg/dL 200(H) 131(H) 239(H)  BUN 7 - 26 mg/dL  12 14 15   Creatinine 0.60 - 1.10 mg/dL 0.89 0.76 0.89  Sodium 136 - 145 mmol/L 139 135 137  Potassium 3.5 - 5.1 mmol/L 4.0 3.4(L) 3.8  Chloride 98 - 109 mmol/L 105 100(L) 106  CO2 22 - 29 mmol/L 26 24 22     Calcium 8.4 - 10.4 mg/dL 9.7 8.9 9.5  Total Protein 6.4 - 8.3 g/dL 7.8 7.8 7.7  Total Bilirubin 0.2 - 1.2 mg/dL 0.3 0.4 0.3  Alkaline Phos 40 - 150 U/L 72 73 79  AST 5 - 34 U/L 20 31 19   ALT 0 - 55 U/L 20 35 24   PATHOLOGY  Diagnosis 06/26/17 1. Breast, right, needle core biopsy, upper inner quadrant, 12:30 o'clock, 7cm from nipple - INVASIVE DUCTAL CARCINOMA - SEE COMMENT 2. Lymph node, needle/core biopsy, right axillary (level 2 node) - NO CARCINOMA IDENTIFIED IN ONE LYMPH NODE (0/1) Results: HER2 - NEGATIVE RATIO OF HER2/CEP17 SIGNALS 1.21 AVERAGE HER2 COPY NUMBER PER CELL 2.00 Results: IMMUNOHISTOCHEMICAL AND MORPHOMETRIC ANALYSIS PERFORMED MANUALLY Estrogen Receptor: 70%, POSITIVE, STRONG STAINING INTENSITY Progesterone Receptor: 70%, POSITIVE, STRONG STAINING INTENSITY Proliferation Marker Ki67: 60%  RADIOGRAPHIC STUDIES: I have personally reviewed the radiological images as listed and agreed with the findings in the report.  Diagnostic Mammogram 06/23/17 IMPRESSION: 1. There is a highly suspicious mass measuring 3.5 cm mammographically in the right breast at the palpable site identified by the patient. 2. There is 1 suspicious lymph node with cortex measuring up to 6 mm. 3.  No mammographic evidence of malignancy in the left breast.   No results found.  ASSESSMENT & PLAN:  52 y.o. pre-menopausal woman, presented with screening discovered to Ductal carcinoma  1.  Malignant neoplasm of the upper-ourter quadrant of right breast, base of ductal carcinoma, pT2N0M0, stage IIA, grade 3, ER+ /PR +, HER2 -, stage IB, Oncotype RS 29 -She underwent Right breast lumpectomy and right axillary sentinel lymph node biopsy on 07/08/17. -We previously reviewed her pathology and oncotype score in detail. She had complete resection, margins are negative. She has grade 3, ER/PR positive, HER2 negative, and node-negative disease. -Given her age, pathologic features, and oncotype score of  29, she will benefit from adjuvant chemotherapy. I recommend TC q3 weeks x4 cycles. She has no other co-morbidities except obesity and has good social support, she would be a good candidate for chemotherapy.  -She started TC with Udenyca on 08/08/17.  She has been tolerating  -Labs reviewed with the patient today. Labs showed: CBC with RDW at 15.5 and Neutro abs at 8.6. CMP PENDING.  -Labs adequate to proceed with cycle 3 TC today.  -Please cancel her injection tomorrow and schedule injection on 6/10 and 6/11 late afternoon    2. Depression/Anxiety -She has a hx of depression and was previously treated with medication. Not currently on medication and she feels that her depression is controlled. Mildly anxious about her diagnosis -I previously offered social work services and she agreed -Her main concern is having to feel dependent on others, but she has good social support. I contacted our breast RN navigator to help assist her with transportation, she agreed  3.  Steroids induced hyperglycemia -Her random blood glucose was 239 today, this is likely steroid-induced hyperglycemia.  She is very obese, likely at high risk for diabetes. -I previously encouraged her to avoid sugar and desserts, will monitor her blood glucose closely.  -She is not checking her blood sugar levels at this time due to not having a reader at  home.  -I advised the patient to check her blood sugar levels at home and follow up with her PCP for evaluation of this.  -I also advised the patient to continue taking Metformin as prescribed to her  4. GCSF induced bone pain -She will take Tylenol, or tramadol as needed -Patient switched to Granix on cycle 2 and notes improvement of bone pain on this and she wishes to continue on this medication -I advised the patient to continue with taking ibuprofen throughout the day for pain and Tylenol #3 as needed for severe pain. -I will refill Tylenol #3 Rx today, 09/19/2017. I do not plan to  refill in the future   5. Diarrhea -She notes that she had 4-5 episodes of diarrhea for 4-5 days and had been taking imodium for this without relief.  -I advised for the patient to remain hydrated as that will aid with possible dehydration due to increased diarrhea.  -I will prescribe lomotil to be used as needed for prolonged diarrhea.    Plan -Labs reveiwed and adequate to proceed with cycle 3 TC today, with Granix next Monday and Tuesday  -Will refill Tylenol #3, compazine, zofran, and prescribe lomotil -f/u in 3 weeks for last cycle chemo, will refer her back to rad/onc on next visit    No orders of the defined types were placed in this encounter.   All questions were answered. The patient knows to call the clinic with any problems, questions or concerns. I spent 20 minutes counseling the patient face to face. The total time spent in the appointment was 25 minutes and more than 50% was on counseling.  I, Soijett Blue am acting as scribe for Dr. Truitt Merle.  I have reviewed the above documentation for accuracy and completeness, and I agree with the above.   Truitt Merle, MD 09/19/2017

## 2017-09-18 ENCOUNTER — Ambulatory Visit: Payer: Self-pay | Admitting: Nurse Practitioner

## 2017-09-18 ENCOUNTER — Ambulatory Visit: Payer: Self-pay

## 2017-09-18 ENCOUNTER — Other Ambulatory Visit: Payer: Self-pay

## 2017-09-19 ENCOUNTER — Ambulatory Visit: Payer: Self-pay

## 2017-09-19 ENCOUNTER — Inpatient Hospital Stay: Payer: BLUE CROSS/BLUE SHIELD | Attending: Hematology

## 2017-09-19 ENCOUNTER — Telehealth: Payer: Self-pay | Admitting: Hematology

## 2017-09-19 ENCOUNTER — Inpatient Hospital Stay: Payer: BLUE CROSS/BLUE SHIELD

## 2017-09-19 ENCOUNTER — Encounter: Payer: Self-pay | Admitting: Hematology

## 2017-09-19 ENCOUNTER — Inpatient Hospital Stay (HOSPITAL_BASED_OUTPATIENT_CLINIC_OR_DEPARTMENT_OTHER): Payer: BLUE CROSS/BLUE SHIELD | Admitting: Hematology

## 2017-09-19 VITALS — BP 176/97 | HR 103 | Temp 98.3°F | Resp 18 | Ht 67.0 in | Wt 335.4 lb

## 2017-09-19 DIAGNOSIS — M898X9 Other specified disorders of bone, unspecified site: Secondary | ICD-10-CM | POA: Diagnosis not present

## 2017-09-19 DIAGNOSIS — C50411 Malignant neoplasm of upper-outer quadrant of right female breast: Secondary | ICD-10-CM | POA: Insufficient documentation

## 2017-09-19 DIAGNOSIS — Z95828 Presence of other vascular implants and grafts: Secondary | ICD-10-CM

## 2017-09-19 DIAGNOSIS — Z5189 Encounter for other specified aftercare: Secondary | ICD-10-CM | POA: Diagnosis not present

## 2017-09-19 DIAGNOSIS — Z5111 Encounter for antineoplastic chemotherapy: Secondary | ICD-10-CM | POA: Diagnosis not present

## 2017-09-19 DIAGNOSIS — Z170 Estrogen receptor positive status [ER+]: Secondary | ICD-10-CM | POA: Diagnosis not present

## 2017-09-19 DIAGNOSIS — C50211 Malignant neoplasm of upper-inner quadrant of right female breast: Secondary | ICD-10-CM

## 2017-09-19 DIAGNOSIS — R739 Hyperglycemia, unspecified: Secondary | ICD-10-CM | POA: Diagnosis not present

## 2017-09-19 DIAGNOSIS — E876 Hypokalemia: Secondary | ICD-10-CM | POA: Diagnosis not present

## 2017-09-19 DIAGNOSIS — R197 Diarrhea, unspecified: Secondary | ICD-10-CM | POA: Diagnosis not present

## 2017-09-19 DIAGNOSIS — F418 Other specified anxiety disorders: Secondary | ICD-10-CM | POA: Diagnosis not present

## 2017-09-19 DIAGNOSIS — T380X5A Adverse effect of glucocorticoids and synthetic analogues, initial encounter: Secondary | ICD-10-CM | POA: Diagnosis not present

## 2017-09-19 LAB — CBC WITH DIFFERENTIAL (CANCER CENTER ONLY)
BASOS ABS: 0 10*3/uL (ref 0.0–0.1)
Basophils Relative: 0 %
Eosinophils Absolute: 0 10*3/uL (ref 0.0–0.5)
Eosinophils Relative: 0 %
HEMATOCRIT: 35 % (ref 34.8–46.6)
HEMOGLOBIN: 11.6 g/dL (ref 11.6–15.9)
LYMPHS ABS: 1.5 10*3/uL (ref 0.9–3.3)
LYMPHS PCT: 15 %
MCH: 27.1 pg (ref 25.1–34.0)
MCHC: 33.1 g/dL (ref 31.5–36.0)
MCV: 81.8 fL (ref 79.5–101.0)
Monocytes Absolute: 0.2 10*3/uL (ref 0.1–0.9)
Monocytes Relative: 2 %
NEUTROS ABS: 8.6 10*3/uL — AB (ref 1.5–6.5)
Neutrophils Relative %: 83 %
Platelet Count: 288 10*3/uL (ref 145–400)
RBC: 4.28 MIL/uL (ref 3.70–5.45)
RDW: 15.5 % — ABNORMAL HIGH (ref 11.2–14.5)
WBC: 10.3 10*3/uL (ref 3.9–10.3)

## 2017-09-19 LAB — CMP (CANCER CENTER ONLY)
ALT: 20 U/L (ref 0–55)
ANION GAP: 8 (ref 3–11)
AST: 20 U/L (ref 5–34)
Albumin: 3.7 g/dL (ref 3.5–5.0)
Alkaline Phosphatase: 72 U/L (ref 40–150)
BUN: 12 mg/dL (ref 7–26)
CHLORIDE: 105 mmol/L (ref 98–109)
CO2: 26 mmol/L (ref 22–29)
Calcium: 9.7 mg/dL (ref 8.4–10.4)
Creatinine: 0.89 mg/dL (ref 0.60–1.10)
Glucose, Bld: 200 mg/dL — ABNORMAL HIGH (ref 70–140)
POTASSIUM: 4 mmol/L (ref 3.5–5.1)
Sodium: 139 mmol/L (ref 136–145)
Total Bilirubin: 0.3 mg/dL (ref 0.2–1.2)
Total Protein: 7.8 g/dL (ref 6.4–8.3)

## 2017-09-19 MED ORDER — SODIUM CHLORIDE 0.9 % IV SOLN
600.0000 mg/m2 | Freq: Once | INTRAVENOUS | Status: AC
  Administered 2017-09-19: 1640 mg via INTRAVENOUS
  Filled 2017-09-19: qty 82

## 2017-09-19 MED ORDER — SODIUM CHLORIDE 0.9% FLUSH
10.0000 mL | INTRAVENOUS | Status: DC | PRN
  Administered 2017-09-19: 10 mL
  Filled 2017-09-19: qty 10

## 2017-09-19 MED ORDER — DEXAMETHASONE SODIUM PHOSPHATE 10 MG/ML IJ SOLN
10.0000 mg | Freq: Once | INTRAMUSCULAR | Status: AC
  Administered 2017-09-19: 10 mg via INTRAVENOUS

## 2017-09-19 MED ORDER — DULOXETINE HCL 30 MG PO CPEP
30.0000 mg | ORAL_CAPSULE | Freq: Every day | ORAL | 0 refills | Status: DC
Start: 2017-09-19 — End: 2017-10-28

## 2017-09-19 MED ORDER — PALONOSETRON HCL INJECTION 0.25 MG/5ML
0.2500 mg | Freq: Once | INTRAVENOUS | Status: AC
Start: 2017-09-19 — End: 2017-09-19
  Administered 2017-09-19: 0.25 mg via INTRAVENOUS

## 2017-09-19 MED ORDER — PROCHLORPERAZINE MALEATE 10 MG PO TABS: 10.0000 mg | ORAL_TABLET | Freq: Four times a day (QID) | ORAL | 2 refills | Status: DC | PRN

## 2017-09-19 MED ORDER — DIPHENOXYLATE-ATROPINE 2.5-0.025 MG PO TABS: 1.0000 | ORAL_TABLET | Freq: Four times a day (QID) | ORAL | 1 refills | Status: DC | PRN

## 2017-09-19 MED ORDER — ACETAMINOPHEN-CODEINE #3 300-30 MG PO TABS: 1.0000 | ORAL_TABLET | Freq: Three times a day (TID) | ORAL | 0 refills | Status: DC | PRN

## 2017-09-19 MED ORDER — ONDANSETRON HCL 8 MG PO TABS: 8.0000 mg | ORAL_TABLET | Freq: Three times a day (TID) | ORAL | 2 refills | Status: DC | PRN

## 2017-09-19 MED ORDER — DEXAMETHASONE SODIUM PHOSPHATE 10 MG/ML IJ SOLN
INTRAMUSCULAR | Status: AC
  Filled 2017-09-19: qty 1

## 2017-09-19 MED ORDER — SODIUM CHLORIDE 0.9 % IV SOLN: Freq: Once | INTRAVENOUS | Status: DC

## 2017-09-19 MED ORDER — PALONOSETRON HCL INJECTION 0.25 MG/5ML
INTRAVENOUS | Status: AC
  Filled 2017-09-19: qty 5

## 2017-09-19 MED ORDER — SODIUM CHLORIDE 0.9 % IV SOLN
75.0000 mg/m2 | Freq: Once | INTRAVENOUS | Status: AC
  Administered 2017-09-19: 210 mg via INTRAVENOUS
  Filled 2017-09-19: qty 21

## 2017-09-19 MED ORDER — DOCETAXEL CHEMO INJECTION 160 MG/16ML: 75.0000 mg/m2 | Freq: Once | INTRAVENOUS | Status: DC

## 2017-09-19 MED ORDER — HEPARIN SOD (PORK) LOCK FLUSH 100 UNIT/ML IV SOLN
500.0000 [IU] | Freq: Once | INTRAVENOUS | Status: AC | PRN
  Administered 2017-09-19: 500 [IU]
  Filled 2017-09-19: qty 5

## 2017-09-19 NOTE — Telephone Encounter (Signed)
Called patient to update her on appointments per 6/7 los.  She also requested that Nutrition Appt be moved as well.

## 2017-09-19 NOTE — Progress Notes (Signed)
Ok to treat with HR of 100 per Dr. Burr Medico. Pt will come back for injection on Monday/tuesday

## 2017-09-19 NOTE — Patient Instructions (Signed)
[facility] Discharge Instructions for Patients Receiving Chemotherapy  Today you received the following chemotherapy agents Taxotere and Cytoxan  To help prevent nausea and vomiting after your treatment, we encourage you to take your nausea medication as directed.    If you develop nausea and vomiting that is not controlled by your nausea medication, call the clinic.   BELOW ARE SYMPTOMS THAT SHOULD BE REPORTED IMMEDIATELY:  *FEVER GREATER THAN 100.5 F  *CHILLS WITH OR WITHOUT FEVER  NAUSEA AND VOMITING THAT IS NOT CONTROLLED WITH YOUR NAUSEA MEDICATION  *UNUSUAL SHORTNESS OF BREATH  *UNUSUAL BRUISING OR BLEEDING  TENDERNESS IN MOUTH AND THROAT WITH OR WITHOUT PRESENCE OF ULCERS  *URINARY PROBLEMS  *BOWEL PROBLEMS  UNUSUAL RASH Items with * indicate a potential emergency and should be followed up as soon as possible.  Feel free to call the clinic should you have any questions or concerns. The clinic phone number is [phone].  Please show the CHEMO ALERT CARD at check-in to the Emergency Department and triage nurse.   

## 2017-09-20 ENCOUNTER — Inpatient Hospital Stay: Payer: BLUE CROSS/BLUE SHIELD

## 2017-09-22 ENCOUNTER — Telehealth: Payer: Self-pay

## 2017-09-22 ENCOUNTER — Inpatient Hospital Stay: Payer: BLUE CROSS/BLUE SHIELD | Admitting: Nutrition

## 2017-09-22 ENCOUNTER — Inpatient Hospital Stay: Payer: BLUE CROSS/BLUE SHIELD

## 2017-09-22 MED ORDER — TBO-FILGRASTIM 480 MCG/0.8ML ~~LOC~~ SOSY
PREFILLED_SYRINGE | SUBCUTANEOUS | Status: AC
  Filled 2017-09-22: qty 0.8

## 2017-09-22 NOTE — Telephone Encounter (Signed)
Called patient at 1627 since she did not show up for 1600 appt. She stated she was not able to get a ride today so she would come tomorrow as scheduled and she  asked to reschedule her shot from today until Wed . Told her she can check with scheduling tomorrow.

## 2017-09-23 ENCOUNTER — Other Ambulatory Visit: Payer: Self-pay | Admitting: Nutrition

## 2017-09-23 ENCOUNTER — Inpatient Hospital Stay: Payer: BLUE CROSS/BLUE SHIELD

## 2017-09-23 VITALS — BP 102/74 | HR 65 | Temp 97.8°F | Resp 20

## 2017-09-23 DIAGNOSIS — Z5111 Encounter for antineoplastic chemotherapy: Secondary | ICD-10-CM | POA: Diagnosis not present

## 2017-09-23 DIAGNOSIS — R7309 Other abnormal glucose: Secondary | ICD-10-CM

## 2017-09-23 DIAGNOSIS — Z95828 Presence of other vascular implants and grafts: Secondary | ICD-10-CM

## 2017-09-23 MED ORDER — TBO-FILGRASTIM 480 MCG/0.8ML ~~LOC~~ SOSY
480.0000 ug | PREFILLED_SYRINGE | Freq: Once | SUBCUTANEOUS | Status: AC
  Administered 2017-09-23: 480 ug via SUBCUTANEOUS

## 2017-09-23 MED ORDER — TBO-FILGRASTIM 480 MCG/0.8ML ~~LOC~~ SOSY
PREFILLED_SYRINGE | SUBCUTANEOUS | Status: AC
  Filled 2017-09-23: qty 0.8

## 2017-09-23 NOTE — Patient Instructions (Signed)
Tbo-Filgrastim injection What is this medicine? TBO-FILGRASTIM (T B O fil GRA stim) is a granulocyte colony-stimulating factor that stimulates the growth of neutrophils, a type of white blood cell important in the body's fight against infection. It is used to reduce the incidence of fever and infection in patients with certain types of cancer who are receiving chemotherapy that affects the bone marrow. This medicine may be used for other purposes; ask your health care provider or pharmacist if you have questions. COMMON BRAND NAME(S): Granix What should I tell my health care provider before I take this medicine? They need to know if you have any of these conditions: -bone scan or tests planned -kidney disease -sickle cell anemia -an unusual or allergic reaction to tbo-filgrastim, filgrastim, pegfilgrastim, other medicines, foods, dyes, or preservatives -pregnant or trying to get pregnant -breast-feeding How should I use this medicine? This medicine is for injection under the skin. If you get this medicine at home, you will be taught how to prepare and give this medicine. Refer to the Instructions for Use that come with your medication packaging. Use exactly as directed. Take your medicine at regular intervals. Do not take your medicine more often than directed. It is important that you put your used needles and syringes in a special sharps container. Do not put them in a trash can. If you do not have a sharps container, call your pharmacist or healthcare provider to get one. Talk to your pediatrician regarding the use of this medicine in children. Special care may be needed. Overdosage: If you think you have taken too much of this medicine contact a poison control center or emergency room at once. NOTE: This medicine is only for you. Do not share this medicine with others. What if I miss a dose? It is important not to miss your dose. Call your doctor or health care professional if you miss a  dose. What may interact with this medicine? This medicine may interact with the following medications: -medicines that may cause a release of neutrophils, such as lithium This list may not describe all possible interactions. Give your health care provider a list of all the medicines, herbs, non-prescription drugs, or dietary supplements you use. Also tell them if you smoke, drink alcohol, or use illegal drugs. Some items may interact with your medicine. What should I watch for while using this medicine? You may need blood work done while you are taking this medicine. What side effects may I notice from receiving this medicine? Side effects that you should report to your doctor or health care professional as soon as possible: -allergic reactions like skin rash, itching or hives, swelling of the face, lips, or tongue -blood in the urine -dark urine -dizziness -fast heartbeat -feeling faint -shortness of breath or breathing problems -signs and symptoms of infection like fever or chills; cough; or sore throat -signs and symptoms of kidney injury like trouble passing urine or change in the amount of urine -stomach or side pain, or pain at the shoulder -sweating -swelling of the legs, ankles, or abdomen -tiredness Side effects that usually do not require medical attention (report to your doctor or health care professional if they continue or are bothersome): -bone pain -headache -muscle pain -vomiting This list may not describe all possible side effects. Call your doctor for medical advice about side effects. You may report side effects to FDA at 1-800-FDA-1088. Where should I keep my medicine? Keep out of the reach of children. Store in a refrigerator between   2 and 8 degrees C (36 and 46 degrees F). Keep in carton to protect from light. Throw away this medicine if it is left out of the refrigerator for more than 5 consecutive days. Throw away any unused medicine after the expiration  date. NOTE: This sheet is a summary. It may not cover all possible information. If you have questions about this medicine, talk to your doctor, pharmacist, or health care provider.  2018 Elsevier/Gold Standard (2015-05-22 19:07:04)  

## 2017-09-24 ENCOUNTER — Inpatient Hospital Stay: Payer: BLUE CROSS/BLUE SHIELD

## 2017-09-24 ENCOUNTER — Telehealth: Payer: Self-pay | Admitting: Hematology

## 2017-09-24 VITALS — BP 150/78 | HR 68 | Temp 98.3°F | Resp 18

## 2017-09-24 DIAGNOSIS — Z95828 Presence of other vascular implants and grafts: Secondary | ICD-10-CM

## 2017-09-24 DIAGNOSIS — Z5111 Encounter for antineoplastic chemotherapy: Secondary | ICD-10-CM | POA: Diagnosis not present

## 2017-09-24 MED ORDER — TBO-FILGRASTIM 480 MCG/0.8ML ~~LOC~~ SOSY
PREFILLED_SYRINGE | SUBCUTANEOUS | Status: AC
  Filled 2017-09-24: qty 0.8

## 2017-09-24 MED ORDER — TBO-FILGRASTIM 480 MCG/0.8ML ~~LOC~~ SOSY
480.0000 ug | PREFILLED_SYRINGE | Freq: Once | SUBCUTANEOUS | Status: AC
  Administered 2017-09-24: 480 ug via SUBCUTANEOUS

## 2017-09-24 NOTE — Patient Instructions (Signed)
Tbo-Filgrastim injection What is this medicine? TBO-FILGRASTIM (T B O fil GRA stim) is a granulocyte colony-stimulating factor that stimulates the growth of neutrophils, a type of white blood cell important in the body's fight against infection. It is used to reduce the incidence of fever and infection in patients with certain types of cancer who are receiving chemotherapy that affects the bone marrow. This medicine may be used for other purposes; ask your health care provider or pharmacist if you have questions. COMMON BRAND NAME(S): Granix What should I tell my health care provider before I take this medicine? They need to know if you have any of these conditions: -bone scan or tests planned -kidney disease -sickle cell anemia -an unusual or allergic reaction to tbo-filgrastim, filgrastim, pegfilgrastim, other medicines, foods, dyes, or preservatives -pregnant or trying to get pregnant -breast-feeding How should I use this medicine? This medicine is for injection under the skin. If you get this medicine at home, you will be taught how to prepare and give this medicine. Refer to the Instructions for Use that come with your medication packaging. Use exactly as directed. Take your medicine at regular intervals. Do not take your medicine more often than directed. It is important that you put your used needles and syringes in a special sharps container. Do not put them in a trash can. If you do not have a sharps container, call your pharmacist or healthcare provider to get one. Talk to your pediatrician regarding the use of this medicine in children. Special care may be needed. Overdosage: If you think you have taken too much of this medicine contact a poison control center or emergency room at once. NOTE: This medicine is only for you. Do not share this medicine with others. What if I miss a dose? It is important not to miss your dose. Call your doctor or health care professional if you miss a  dose. What may interact with this medicine? This medicine may interact with the following medications: -medicines that may cause a release of neutrophils, such as lithium This list may not describe all possible interactions. Give your health care provider a list of all the medicines, herbs, non-prescription drugs, or dietary supplements you use. Also tell them if you smoke, drink alcohol, or use illegal drugs. Some items may interact with your medicine. What should I watch for while using this medicine? You may need blood work done while you are taking this medicine. What side effects may I notice from receiving this medicine? Side effects that you should report to your doctor or health care professional as soon as possible: -allergic reactions like skin rash, itching or hives, swelling of the face, lips, or tongue -blood in the urine -dark urine -dizziness -fast heartbeat -feeling faint -shortness of breath or breathing problems -signs and symptoms of infection like fever or chills; cough; or sore throat -signs and symptoms of kidney injury like trouble passing urine or change in the amount of urine -stomach or side pain, or pain at the shoulder -sweating -swelling of the legs, ankles, or abdomen -tiredness Side effects that usually do not require medical attention (report to your doctor or health care professional if they continue or are bothersome): -bone pain -headache -muscle pain -vomiting This list may not describe all possible side effects. Call your doctor for medical advice about side effects. You may report side effects to FDA at 1-800-FDA-1088. Where should I keep my medicine? Keep out of the reach of children. Store in a refrigerator between   2 and 8 degrees C (36 and 46 degrees F). Keep in carton to protect from light. Throw away this medicine if it is left out of the refrigerator for more than 5 consecutive days. Throw away any unused medicine after the expiration  date. NOTE: This sheet is a summary. It may not cover all possible information. If you have questions about this medicine, talk to your doctor, pharmacist, or health care provider.  2018 Elsevier/Gold Standard (2015-05-22 19:07:04)  

## 2017-09-24 NOTE — Telephone Encounter (Signed)
Referral for Nutrition & Diab entered into [facility] per 6/11 staff message

## 2017-09-26 ENCOUNTER — Other Ambulatory Visit: Payer: Self-pay

## 2017-09-26 ENCOUNTER — Encounter (HOSPITAL_COMMUNITY): Payer: Self-pay

## 2017-09-26 ENCOUNTER — Emergency Department (HOSPITAL_COMMUNITY): Payer: BLUE CROSS/BLUE SHIELD

## 2017-09-26 ENCOUNTER — Telehealth: Payer: Self-pay

## 2017-09-26 ENCOUNTER — Inpatient Hospital Stay (HOSPITAL_COMMUNITY)
Admission: EM | Admit: 2017-09-26 | Discharge: 2017-09-28 | DRG: 872 | Disposition: A | Discharge status: Home / Self Care | Payer: BLUE CROSS/BLUE SHIELD | Attending: Internal Medicine | Admitting: Internal Medicine

## 2017-09-26 DIAGNOSIS — Z95828 Presence of other vascular implants and grafts: Secondary | ICD-10-CM | POA: Diagnosis not present

## 2017-09-26 DIAGNOSIS — Z9011 Acquired absence of right breast and nipple: Secondary | ICD-10-CM | POA: Diagnosis not present

## 2017-09-26 DIAGNOSIS — R5081 Fever presenting with conditions classified elsewhere: Secondary | ICD-10-CM | POA: Diagnosis present

## 2017-09-26 DIAGNOSIS — Z8249 Family history of ischemic heart disease and other diseases of the circulatory system: Secondary | ICD-10-CM | POA: Diagnosis not present

## 2017-09-26 DIAGNOSIS — Z7984 Long term (current) use of oral hypoglycemic drugs: Secondary | ICD-10-CM | POA: Diagnosis not present

## 2017-09-26 DIAGNOSIS — E099 Drug or chemical induced diabetes mellitus without complications: Secondary | ICD-10-CM

## 2017-09-26 DIAGNOSIS — T380X5A Adverse effect of glucocorticoids and synthetic analogues, initial encounter: Secondary | ICD-10-CM | POA: Diagnosis not present

## 2017-09-26 DIAGNOSIS — Z833 Family history of diabetes mellitus: Secondary | ICD-10-CM | POA: Diagnosis not present

## 2017-09-26 DIAGNOSIS — K219 Gastro-esophageal reflux disease without esophagitis: Secondary | ICD-10-CM | POA: Diagnosis present

## 2017-09-26 DIAGNOSIS — F329 Major depressive disorder, single episode, unspecified: Secondary | ICD-10-CM | POA: Diagnosis present

## 2017-09-26 DIAGNOSIS — Z9049 Acquired absence of other specified parts of digestive tract: Secondary | ICD-10-CM

## 2017-09-26 DIAGNOSIS — R197 Diarrhea, unspecified: Secondary | ICD-10-CM | POA: Diagnosis present

## 2017-09-26 DIAGNOSIS — E876 Hypokalemia: Secondary | ICD-10-CM | POA: Diagnosis present

## 2017-09-26 DIAGNOSIS — F419 Anxiety disorder, unspecified: Secondary | ICD-10-CM | POA: Diagnosis present

## 2017-09-26 DIAGNOSIS — E118 Type 2 diabetes mellitus with unspecified complications: Secondary | ICD-10-CM | POA: Diagnosis not present

## 2017-09-26 DIAGNOSIS — A419 Sepsis, unspecified organism: Secondary | ICD-10-CM | POA: Diagnosis not present

## 2017-09-26 DIAGNOSIS — Z170 Estrogen receptor positive status [ER+]: Secondary | ICD-10-CM

## 2017-09-26 DIAGNOSIS — E119 Type 2 diabetes mellitus without complications: Secondary | ICD-10-CM

## 2017-09-26 DIAGNOSIS — Z923 Personal history of irradiation: Secondary | ICD-10-CM | POA: Diagnosis not present

## 2017-09-26 DIAGNOSIS — C50211 Malignant neoplasm of upper-inner quadrant of right female breast: Secondary | ICD-10-CM | POA: Diagnosis not present

## 2017-09-26 DIAGNOSIS — D709 Neutropenia, unspecified: Secondary | ICD-10-CM | POA: Diagnosis present

## 2017-09-26 DIAGNOSIS — E089 Diabetes mellitus due to underlying condition without complications: Secondary | ICD-10-CM | POA: Diagnosis not present

## 2017-09-26 LAB — PROTIME-INR
INR: 1.11
Prothrombin Time: 14.2 seconds (ref 11.4–15.2)

## 2017-09-26 LAB — CBC WITH DIFFERENTIAL/PLATELET
BLASTS: 0 %
Band Neutrophils: 0 %
Basophils Absolute: 0 10*3/uL (ref 0.0–0.1)
Basophils Relative: 1 %
EOS PCT: 0 %
Eosinophils Absolute: 0 10*3/uL (ref 0.0–0.7)
HEMATOCRIT: 29 % — AB (ref 36.0–46.0)
Hemoglobin: 9.7 g/dL — ABNORMAL LOW (ref 12.0–15.0)
LYMPHS ABS: 1.3 10*3/uL (ref 0.7–4.0)
Lymphocytes Relative: 80 %
MCH: 27.2 pg (ref 26.0–34.0)
MCHC: 33.4 g/dL (ref 30.0–36.0)
MCV: 81.2 fL (ref 78.0–100.0)
MYELOCYTES: 1 %
Metamyelocytes Relative: 0 %
Monocytes Absolute: 0.3 10*3/uL (ref 0.1–1.0)
Monocytes Relative: 16 %
NEUTROS PCT: 2 %
Neutro Abs: 0 10*3/uL — ABNORMAL LOW (ref 1.7–7.7)
Other: 0 %
PLATELETS: 315 10*3/uL (ref 150–400)
Promyelocytes Relative: 0 %
RBC: 3.57 MIL/uL — AB (ref 3.87–5.11)
RDW: 15.5 % (ref 11.5–15.5)
WBC: 1.6 10*3/uL — AB (ref 4.0–10.5)
nRBC: 0 /100 WBC

## 2017-09-26 LAB — I-STAT BETA HCG BLOOD, ED (MC, WL, AP ONLY)

## 2017-09-26 LAB — COMPREHENSIVE METABOLIC PANEL
ALBUMIN: 3.7 g/dL (ref 3.5–5.0)
ALT: 136 U/L — ABNORMAL HIGH (ref 14–54)
ANION GAP: 10 (ref 5–15)
AST: 96 U/L — ABNORMAL HIGH (ref 15–41)
Alkaline Phosphatase: 87 U/L (ref 38–126)
BILIRUBIN TOTAL: 0.6 mg/dL (ref 0.3–1.2)
BUN: 14 mg/dL (ref 6–20)
CO2: 24 mmol/L (ref 22–32)
Calcium: 8.8 mg/dL — ABNORMAL LOW (ref 8.9–10.3)
Chloride: 99 mmol/L — ABNORMAL LOW (ref 101–111)
Creatinine, Ser: 1.01 mg/dL — ABNORMAL HIGH (ref 0.44–1.00)
GFR calc Af Amer: >60 mL/min (ref >60)
GFR calc non Af Amer: >60 mL/min (ref >60)
GLUCOSE: 157 mg/dL — AB (ref 65–99)
POTASSIUM: 3.1 mmol/L — AB (ref 3.5–5.1)
SODIUM: 133 mmol/L — AB (ref 135–145)
TOTAL PROTEIN: 7.4 g/dL (ref 6.5–8.1)

## 2017-09-26 LAB — URINALYSIS, ROUTINE W REFLEX MICROSCOPIC
BILIRUBIN URINE: NEGATIVE
GLUCOSE, UA: NEGATIVE mg/dL
KETONES UR: NEGATIVE mg/dL
NITRITE: NEGATIVE
PROTEIN: NEGATIVE mg/dL
Specific Gravity, Urine: 1.019 (ref 1.005–1.030)
pH: 5 (ref 5.0–8.0)

## 2017-09-26 LAB — I-STAT CG4 LACTIC ACID, ED: Lactic Acid, Venous: 3.1 mmol/L (ref 0.5–1.9)

## 2017-09-26 LAB — MRSA PCR SCREENING: MRSA BY PCR: NEGATIVE

## 2017-09-26 LAB — LACTIC ACID, PLASMA: Lactic Acid, Venous: 0.9 mmol/L (ref 0.5–1.9)

## 2017-09-26 LAB — GLUCOSE, CAPILLARY: Glucose-Capillary: 108 mg/dL — ABNORMAL HIGH (ref 65–99)

## 2017-09-26 MED ORDER — SODIUM CHLORIDE 0.9 % IV BOLUS (SEPSIS)
1000.0000 mL | Freq: Once | INTRAVENOUS | Status: AC
  Administered 2017-09-26: 1000 mL via INTRAVENOUS

## 2017-09-26 MED ORDER — ACETAMINOPHEN 650 MG RE SUPP: 650.0000 mg | Freq: Four times a day (QID) | RECTAL | Status: DC | PRN

## 2017-09-26 MED ORDER — ENOXAPARIN SODIUM 40 MG/0.4ML ~~LOC~~ SOLN
40.0000 mg | SUBCUTANEOUS | Status: DC
  Administered 2017-09-26 – 2017-09-27 (×2): 40 mg via SUBCUTANEOUS
  Filled 2017-09-26 (×2): qty 0.4

## 2017-09-26 MED ORDER — TBO-FILGRASTIM 480 MCG/0.8ML ~~LOC~~ SOSY: 480.0000 ug | PREFILLED_SYRINGE | Freq: Once | SUBCUTANEOUS | Status: DC

## 2017-09-26 MED ORDER — PIPERACILLIN-TAZOBACTAM 3.375 G IVPB 30 MIN
3.3750 g | Freq: Once | INTRAVENOUS | Status: AC
  Administered 2017-09-26: 3.375 g via INTRAVENOUS
  Filled 2017-09-26: qty 50

## 2017-09-26 MED ORDER — VANCOMYCIN HCL IN DEXTROSE 1-5 GM/200ML-% IV SOLN
1000.0000 mg | Freq: Once | INTRAVENOUS | Status: AC
  Administered 2017-09-26: 1000 mg via INTRAVENOUS
  Filled 2017-09-26: qty 200

## 2017-09-26 MED ORDER — ONDANSETRON HCL 4 MG/2ML IJ SOLN: 4.0000 mg | Freq: Four times a day (QID) | INTRAMUSCULAR | Status: DC | PRN

## 2017-09-26 MED ORDER — INSULIN ASPART 100 UNIT/ML ~~LOC~~ SOLN: 0.0000 [IU] | SUBCUTANEOUS | Status: DC

## 2017-09-26 MED ORDER — TRAMADOL HCL 50 MG PO TABS: 50.0000 mg | ORAL_TABLET | Freq: Four times a day (QID) | ORAL | Status: DC | PRN

## 2017-09-26 MED ORDER — ACETAMINOPHEN-CODEINE #3 300-30 MG PO TABS: 1.0000 | ORAL_TABLET | Freq: Three times a day (TID) | ORAL | Status: DC | PRN

## 2017-09-26 MED ORDER — ACETAMINOPHEN 325 MG PO TABS
650.0000 mg | ORAL_TABLET | Freq: Once | ORAL | Status: AC
  Administered 2017-09-26: 650 mg via ORAL
  Filled 2017-09-26: qty 2

## 2017-09-26 MED ORDER — ONDANSETRON HCL 4 MG PO TABS: 4.0000 mg | ORAL_TABLET | Freq: Four times a day (QID) | ORAL | Status: DC | PRN

## 2017-09-26 MED ORDER — SODIUM CHLORIDE 0.9 % IV BOLUS
1000.0000 mL | Freq: Once | INTRAVENOUS | Status: AC
  Administered 2017-09-26: 1000 mL via INTRAVENOUS

## 2017-09-26 MED ORDER — DULOXETINE HCL 30 MG PO CPEP
30.0000 mg | ORAL_CAPSULE | Freq: Every day | ORAL | Status: DC
  Administered 2017-09-27 – 2017-09-28 (×2): 30 mg via ORAL
  Filled 2017-09-26 (×2): qty 1

## 2017-09-26 MED ORDER — IBUPROFEN 200 MG PO TABS: 400.0000 mg | ORAL_TABLET | Freq: Every day | ORAL | Status: DC | PRN

## 2017-09-26 MED ORDER — TBO-FILGRASTIM 480 MCG/0.8ML ~~LOC~~ SOSY
480.0000 ug | PREFILLED_SYRINGE | Freq: Once | SUBCUTANEOUS | Status: AC
  Administered 2017-09-26: 480 ug via SUBCUTANEOUS
  Filled 2017-09-26: qty 0.8

## 2017-09-26 MED ORDER — ACETAMINOPHEN 325 MG PO TABS
650.0000 mg | ORAL_TABLET | Freq: Four times a day (QID) | ORAL | Status: DC | PRN
  Administered 2017-09-27: 650 mg via ORAL
  Filled 2017-09-26: qty 2

## 2017-09-26 MED ORDER — INSULIN ASPART 100 UNIT/ML ~~LOC~~ SOLN: 0.0000 [IU] | Freq: Three times a day (TID) | SUBCUTANEOUS | Status: DC

## 2017-09-26 MED ORDER — SODIUM CHLORIDE 0.9 % IV SOLN
INTRAVENOUS | Status: DC
  Administered 2017-09-26 – 2017-09-27 (×3): via INTRAVENOUS

## 2017-09-26 NOTE — Telephone Encounter (Signed)
Patient calls stating she usually has an appointment for labs this week however her next appointment is 6/28.  She wants to know if she needs to come in for labs sooner than that or if she can just get them on 6/28.  [phone]

## 2017-09-26 NOTE — ED Notes (Signed)
Spoke with Dr. Alcario Drought at bedside and will repeat her lactic acid bloodwork after she has finished the initial boluses

## 2017-09-26 NOTE — Telephone Encounter (Signed)
Called patient per Dr. Burr Medico she states she feels pretty good and thinks she can wait until 6/28, advised that if things change to call our office and we will see her sooner. She verbalized an understanding.

## 2017-09-26 NOTE — H&P (Signed)
History and Physical    Caitlin Cohen NID:[id number] DOB: 1965/05/03 DOA: 09/26/2017  PCP: Lucianne Lei, MD  Patient coming from: Home  I have personally briefly reviewed patient's old medical records in [place]  Chief Complaint: Fever, weakness, diarrhea  HPI: Caitlin Cohen is a 52 y.o. 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  Cycle 3 of TC chemo on 6/7 by Dr. Krista Blue.  Had Granix 480 mcg on 6/12.  Since Sunday has had nausea, diarrhea.  Due to continuing to not feel well, family member insisted (correctly) that she come to the ED.  She wasn't aware she was febrile at home (has fever here).  Mild dyspnea, no cough.  No rashes, no dysuria.   ED Course: Fever 103.1, tachy to 140, tachypnea to 26, initial BP is actually high 144R-154M systolic.  WBC 1.6k, of which she has an ANC of 0.0, monocyte count of 300, rest are lymphocytes.  Given zosyn and vanc, cultures obtained.   Review of Systems: As per HPI otherwise 10 point review of systems negative.   Past Medical History:  Diagnosis Date  . Anemia   . Anxiety   . Cancer [facility])    breast cancer right  . Depression   . GERD (gastroesophageal reflux disease)   . Headache     Past Surgical History:  Procedure Laterality Date  . CERVICAL CONE BIOPSY    . CHOLECYSTECTOMY    . MASTECTOMY WITH RADIOACTIVE SEED GUIDED EXCISION AND AXILLARY SENTINEL LYMPH NODE BIOPSY Right 07/08/2017   Procedure: RIGHT LUMPTECTOMY WITH RADIOACTIVE SEED GUIDED EXCISION AND RIGHT AXILLARY SENTINEL LYMPH NODE BIOPSY ERAS PATHWAY;  Surgeon: [name], MD;  Location: [place];  Service: General;  Laterality: Right;  PEC BLOCK  . OPEN REDUCTION INTERNAL FIXATION (ORIF) DISTAL RADIAL FRACTURE  03/17/2012   Procedure: OPEN REDUCTION INTERNAL FIXATION (ORIF) DISTAL RADIAL FRACTURE;  Surgeon: Roseanne Kaufman, MD;  Location: [place];  Service: Orthopedics;  Laterality: Right;  Pre-operative a supra-clavical  block right arm in addition to general anesthesia  . PORTACATH PLACEMENT N/A 07/30/2017   Procedure: INSERTION PORT-A-CATH WITH [place];  Surgeon: [name], MD;  Location: [place];  Service: General;  Laterality: N/A;  . TUBAL LIGATION       reports that she has never smoked. She has never used smokeless tobacco. She reports that she drinks alcohol. She reports that she does not use drugs.  No Known Allergies  Family History  Problem Relation Age of Onset  . Hypertension Mother   . Diabetes Father   . Hypertension Father      Prior to Admission medications   Medication Sig Start Date End Date Taking? Authorizing Provider  acetaminophen-codeine (TYLENOL #3) 300-30 MG tablet Take 1 tablet by mouth every 8 (eight) hours as needed for moderate pain. 09/19/17  Yes Truitt Merle, MD  diphenoxylate-atropine (LOMOTIL) 2.5-0.025 MG tablet Take 1-2 tablets by mouth 4 (four) times daily as needed for diarrhea or loose stools. 09/19/17  Yes Truitt Merle, MD  DULoxetine (CYMBALTA) 30 MG capsule Take 1 capsule (30 mg total) by mouth daily. 09/19/17  Yes Truitt Merle, MD  lidocaine-prilocaine (EMLA) cream Apply to affected area once 08/05/17  Yes Truitt Merle, MD  metFORMIN (GLUCOPHAGE) 500 MG tablet Take 1 tablet (500 mg total) by mouth 2 (two) times daily with a meal. 09/05/17  Yes [name], NP  ondansetron (ZOFRAN) 8 MG tablet Take 1 tablet (8 mg total)  by mouth every 8 (eight) hours as needed for nausea or vomiting. 09/19/17  Yes Truitt Merle, MD  prochlorperazine (COMPAZINE) 10 MG tablet Take 1 tablet (10 mg total) by mouth every 6 (six) hours as needed for nausea or vomiting. 09/19/17  Yes Truitt Merle, MD  dexamethasone (DECADRON) 4 MG tablet Take 1 tablet (4 mg total) by mouth 2 (two) times daily with a meal. Take one tablet twice a day - day before chemo and day after chemo 08/26/17   Truitt Merle, MD  ibuprofen (ADVIL,MOTRIN) 200 MG tablet Take 400 mg by mouth daily as needed for mild pain or moderate pain.     [provider]  traMADol (ULTRAM) 50 MG tablet Take 1 tablet (50 mg total) by mouth every 6 (six) hours as needed. Patient taking differently: Take 50 mg by mouth every 6 (six) hours as needed for moderate pain.  08/14/17   [name], NP    Physical Exam: Vitals:   09/26/17 1922 09/26/17 2040 09/26/17 2041 09/26/17 2045  BP: (!) 152/92  138/81 133/82  Pulse: (!) 131  (!) 126 (!) 126  Resp: (!) 23  (!) 22 (!) 23  Temp: (!) 103 F (39.4 C) 99.7 F (37.6 C)    TempSrc: Oral Oral    SpO2: 100%  98% 94%  Weight:      Height:        Constitutional: NAD, calm, comfortable Eyes: PERRL, lids and conjunctivae normal ENMT: Mucous membranes are moist. Posterior pharynx clear of any exudate or lesions.Normal dentition.  Neck: normal, supple, no masses, no thyromegaly Respiratory: clear to auscultation bilaterally, no wheezing, no crackles. Normal respiratory effort. No accessory muscle use.  Cardiovascular: Tachycardic, regular rhythm Abdomen: no tenderness, no masses palpated. No hepatosplenomegaly. Bowel sounds positive.  Musculoskeletal: no clubbing / cyanosis. No joint deformity upper and lower extremities. Good ROM, no contractures. Normal muscle tone.  Skin: no rashes, lesions, ulcers. No induration Neurologic: CN 2-12 grossly intact. Sensation intact, DTR normal. Strength 5/5 in all 4.  Psychiatric: Normal judgment and insight. Alert and oriented x 3. Normal mood.    Labs on Admission: I have personally reviewed following labs and imaging studies  CBC: Recent Labs  Lab 09/26/17 1812  WBC 1.6*  NEUTROABS 0.0*  HGB 9.7*  HCT 29.0*  MCV 81.2  PLT 122   Basic Metabolic Panel: Recent Labs  Lab 09/26/17 1812  NA 133*  K 3.1*  CL 99*  CO2 24  GLUCOSE 157*  BUN 14  CREATININE 1.01*  CALCIUM 8.8*   GFR: Estimated Creatinine Clearance: 100.7 mL/min (A) (by C-G formula based on SCr of 1.01 mg/dL (H)). Liver Function Tests: Recent Labs  Lab 09/26/17 1812    AST 96*  ALT 136*  ALKPHOS 87  BILITOT 0.6  PROT 7.4  ALBUMIN 3.7   No results for input(s): LIPASE, AMYLASE in the last 168 hours. No results for input(s): AMMONIA in the last 168 hours. Coagulation Profile: Recent Labs  Lab 09/26/17 1812  INR 1.11   Cardiac Enzymes: No results for input(s): CKTOTAL, CKMB, CKMBINDEX, TROPONINI in the last 168 hours. BNP (last 3 results) No results for input(s): PROBNP in the last 8760 hours. HbA1C: No results for input(s): HGBA1C in the last 72 hours. CBG: No results for input(s): GLUCAP in the last 168 hours. Lipid Profile: No results for input(s): CHOL, HDL, LDLCALC, TRIG, CHOLHDL, LDLDIRECT in the last 72 hours. Thyroid Function Tests: No results for input(s): TSH, T4TOTAL, FREET4, T3FREE,  THYROIDAB in the last 72 hours. Anemia Panel: No results for input(s): VITAMINB12, FOLATE, FERRITIN, TIBC, IRON, RETICCTPCT in the last 72 hours. Urine analysis:    Component Value Date/Time   COLORURINE YELLOW 09/26/2017 2028   APPEARANCEUR HAZY (A) 09/26/2017 2028   LABSPEC 1.019 09/26/2017 2028   PHURINE 5.0 09/26/2017 2028   GLUCOSEU NEGATIVE 09/26/2017 2028   HGBUR MODERATE (A) 09/26/2017 2028   BILIRUBINUR NEGATIVE 09/26/2017 2028   KETONESUR NEGATIVE 09/26/2017 2028   PROTEINUR NEGATIVE 09/26/2017 2028   NITRITE NEGATIVE 09/26/2017 2028   LEUKOCYTESUR TRACE (A) 09/26/2017 2028    Radiological Exams on Admission: Dg Chest Port 1 View  Result Date: 09/26/2017 CLINICAL DATA:  Patient undergoing chemotherapy for breast cancer. Nausea and diarrhea since 09/22/2017. EXAM: PORTABLE CHEST 1 VIEW COMPARISON:  Single-view of the chest 07/30/2017. FINDINGS: The lungs are clear. Heart size is normal. No pneumothorax or pleural effusion. Port-A-Cath noted. IMPRESSION: Negative chest. Electronically Signed   By: Inge Rise M.D.   On: 09/26/2017 19:38    EKG: Independently reviewed.  Assessment/Plan Principal Problem:   Febrile neutropenia  (HCC) Active Problems:   Malignant neoplasm of upper-inner quadrant of right breast in female, estrogen receptor positive ([place])   Steroid-induced diabetes ([place])   Sepsis (HCC)   Diarrhea    1. Febrile neutropenia with sepsis - 1. Sepsis pathway 2. Fever controlled with tylenol 3. IVF: 3L NS bolus in ED then 125 cc/hr 4. Tele monitor 5. Zosyn / vanc 6. Got Granix x2 days ago. 7. Per Dr. Marin Olp: 1. OKAY give another 481mg Granix [place] now 8. BCx, UCx, pending 9. Repeat lactate pending after IVF bolus done 10. Neutropenic precautions 2. Diarrhea - 1. C.Diff quick scan pending 2. GI pathogen pnl pending 3. Steroid induced DM - 1. Mod scale SSI AC 2. Hold metformin 4. BRCA - 1. Spoke with Dr. EMarin Olpabout febrile neutropenia as above 2. Put IP consult order into Epic for onc.  DVT prophylaxis: Lovenox Code Status: Full Family Communication: Family at bedside Disposition Plan: Home after admit Consults called: Spoke with Dr. EMarin OlpAdmission status: Admit to inpatient   GEtta Quill DO [facility] Pager [phone] works nights!  If 7AM-7PM, please contact the primary day team physician taking care of patient  [email or web address] Password [email or web address] 09/26/2017, 9:13 PM

## 2017-09-26 NOTE — Progress Notes (Signed)
A consult was received from an ED physician for Vancomycin and Zosyn per pharmacy dosing (for an indication other than meningitis). The patient's profile has been reviewed for ht/wt/allergies/indication/available labs. A one time order has been placed for the above antibiotics.  Further antibiotics/pharmacy consults should be ordered by admitting physician if indicated.                       Reuel Boom, PharmD, BCPS [phone] 09/26/2017, 5:52 PM

## 2017-09-26 NOTE — ED Provider Notes (Signed)
Pt received at sign out with CXR and CBC/diff pending. Pt hx breast CA with LD chemo 1 week ago. Pt c/o nausea and diarrhea this past week, with chills. Code Sepsis called on pt arrival to ED. IV abx given after BC obtained. IVF infusing. Pt has not urinated yet in the ED. APAP given for fever. BP without hypotension. 2010:  T/C returned from [facility] Dr. Gwendolyn Lima, case discussed, including:  HPI, pertinent PM/SHx, VS/PE, dx testing, ED course and treatment:  Agreeable to admit.    Patient Vitals for the past 24 hrs:  BP Temp Temp src Pulse Resp SpO2 Height Weight  09/26/17 1922 (!) 152/92 (!) 103 F (39.4 C) Oral (!) 131 (!) 23 100 % - -  09/26/17 1730 (!) 118/100 - - - 19 - - -  09/26/17 1711 - - - - - - 5\' 7"  (1.702 m) (!) 149.7 kg (330 lb)  09/26/17 1659 (!) 136/111 (!) 103.1 F (39.5 C) Oral (!) 140 (!) 26 100 % - -     Results for orders placed or performed during the hospital encounter of 09/26/17  Comprehensive metabolic panel  Result Value Ref Range   Sodium 133 (L) 135 - 145 mmol/L   Potassium 3.1 (L) 3.5 - 5.1 mmol/L   Chloride 99 (L) 101 - 111 mmol/L   CO2 24 22 - 32 mmol/L   Glucose, Bld 157 (H) 65 - 99 mg/dL   BUN 14 6 - 20 mg/dL   Creatinine, Ser 1.01 (H) 0.44 - 1.00 mg/dL   Calcium 8.8 (L) 8.9 - 10.3 mg/dL   Total Protein 7.4 6.5 - 8.1 g/dL   Albumin 3.7 3.5 - 5.0 g/dL   AST 96 (H) 15 - 41 U/L   ALT 136 (H) 14 - 54 U/L   Alkaline Phosphatase 87 38 - 126 U/L   Total Bilirubin 0.6 0.3 - 1.2 mg/dL   GFR calc non Af Amer >60 >60 mL/min   GFR calc Af Amer >60 >60 mL/min   Anion gap 10 5 - 15  CBC with Differential  Result Value Ref Range   WBC 1.6 (L) 4.0 - 10.5 K/uL   RBC 3.57 (L) 3.87 - 5.11 MIL/uL   Hemoglobin 9.7 (L) 12.0 - 15.0 g/dL   HCT 29.0 (L) 36.0 - 46.0 %   MCV 81.2 78.0 - 100.0 fL   MCH 27.2 26.0 - 34.0 pg   MCHC 33.4 30.0 - 36.0 g/dL   RDW 15.5 11.5 - 15.5 %   Platelets 315 150 - 400 K/uL   Neutrophils Relative % 2 %   Lymphocytes Relative 80 %   Monocytes Relative 16 %   Eosinophils Relative 0 %   Basophils Relative 1 %   Band Neutrophils 0 %   Metamyelocytes Relative 0 %   Myelocytes 1 %   Promyelocytes Relative 0 %   Blasts 0 %   nRBC 0 0 /100 WBC   Other 0 %   Neutro Abs 0.0 (L) 1.7 - 7.7 K/uL   Lymphs Abs 1.3 0.7 - 4.0 K/uL   Monocytes Absolute 0.3 0.1 - 1.0 K/uL   Eosinophils Absolute 0.0 0.0 - 0.7 K/uL   Basophils Absolute 0.0 0.0 - 0.1 K/uL   Smear Review MORPHOLOGY UNREMARKABLE   Protime-INR  Result Value Ref Range   Prothrombin Time 14.2 11.4 - 15.2 seconds   INR 1.11   I-Stat CG4 Lactic Acid, ED  Result Value Ref Range   Lactic Acid, Venous 3.10 (HH)  0.5 - 1.9 mmol/L   Comment NOTIFIED PHYSICIAN   I-Stat beta hCG blood, ED  Result Value Ref Range   I-stat hCG, quantitative <5.0 <5 mIU/mL   Comment 3           Dg Chest Port 1 View Result Date: 09/26/2017 CLINICAL DATA:  Patient undergoing chemotherapy for breast cancer. Nausea and diarrhea since 09/22/2017. EXAM: PORTABLE CHEST 1 VIEW COMPARISON:  Single-view of the chest 07/30/2017. FINDINGS: The lungs are clear. Heart size is normal. No pneumothorax or pleural effusion. Port-A-Cath noted. IMPRESSION: Negative chest. Electronically Signed   By: Inge Rise M.D.   On: 09/26/2017 19:38      Francine Graven, DO 09/26/17 2018

## 2017-09-26 NOTE — ED Notes (Signed)
Attempted to have pt provide stool sample however pt only passed a small clot. Pt with known history of hemorrhoids. Dr. Alcario Drought notified and also made aware that there may be blood in UA as a result as well.

## 2017-09-26 NOTE — ED Triage Notes (Addendum)
Patient c/o "feeling bad"and having weakness x 5 days. Last chemo treatment was a week ago. Patient states SOB, but not necessarily any worse than usual.  Patient added that she has been having abdominal pain and diarrhea this week. And states that the cancer physician ordered her something for the diarrhea and is better at this time.

## 2017-09-26 NOTE — ED Provider Notes (Signed)
[facility] DEPT Provider Note   CSN: [id number] Arrival date & time: 09/26/17  1646     History   Chief Complaint Chief Complaint  Patient presents with  . Fever  . Weakness  . chemo patient  . Shortness of Breath  . Abdominal Pain    HPI Caitlin Cohen is a 52 y.o. female.  HPI 52 year old female with active breast cancer, last chemo last Friday.  She states that since Sunday she has had nausea and has had diarrhea.  She continued to not feel well and her family member insisted she come to the ED.  She is febrile here but was not aware that she was febrile at home.  She has not noted chills.  She has had some mild dyspnea but has not noted any productive cough.  She has had the diarrhea and has had some nausea and decreased p.o. intake.  She denies any rashes, urinary tract infection symptoms, or other signs of infection. [place]  Primary care doctor-Dr. Criss Rosales Past Medical History:  Diagnosis Date  . Anemia   . Anxiety   . Cancer [facility])    breast cancer right  . Depression   . GERD (gastroesophageal reflux disease)   . Headache     Patient Active Problem List   Diagnosis Date Noted  . 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 Port-A-Cath in place 08/29/2017  . Malignant neoplasm of upper-inner quadrant of right breast in female, estrogen receptor positive ([place]) 07/01/2017  . Mood disorder ([place]) 07/05/2013    Class: Acute    Past Surgical History:  Procedure Laterality Date  . CERVICAL CONE BIOPSY    . CHOLECYSTECTOMY    . MASTECTOMY WITH RADIOACTIVE SEED GUIDED EXCISION AND AXILLARY SENTINEL LYMPH NODE BIOPSY Right 07/08/2017   Procedure: RIGHT LUMPTECTOMY WITH RADIOACTIVE SEED GUIDED EXCISION AND RIGHT AXILLARY SENTINEL LYMPH NODE BIOPSY ERAS PATHWAY;  Surgeon: [name], MD;  Location: [place];  Service: General;  Laterality: Right;  PEC BLOCK  . OPEN REDUCTION INTERNAL FIXATION (ORIF) DISTAL RADIAL FRACTURE   03/17/2012   Procedure: OPEN REDUCTION INTERNAL FIXATION (ORIF) DISTAL RADIAL FRACTURE;  Surgeon: Roseanne Kaufman, MD;  Location: [place];  Service: Orthopedics;  Laterality: Right;  Pre-operative a supra-clavical block right arm in addition to general anesthesia  . PORTACATH PLACEMENT N/A 07/30/2017   Procedure: INSERTION PORT-A-CATH WITH [place];  Surgeon: [name], MD;  Location: [place];  Service: General;  Laterality: N/A;  . TUBAL LIGATION       OB History    Gravida  3   Para      Term      Preterm      AB      Living  3     SAB      TAB      Ectopic      Multiple      Live Births  3            Home Medications    Prior to Admission medications   Medication Sig Start Date End Date Taking? Authorizing Provider  acetaminophen-codeine (TYLENOL #3) 300-30 MG tablet Take 1 tablet by mouth every 8 (eight) hours as needed for moderate pain. 09/19/17  Yes Truitt Merle, MD  diphenoxylate-atropine (LOMOTIL) 2.5-0.025 MG tablet Take 1-2 tablets by mouth 4 (four) times daily as needed for diarrhea or loose stools. 09/19/17  Yes Truitt Merle, MD  DULoxetine (CYMBALTA) 30 MG capsule Take 1 capsule (30 mg total)  by mouth daily. 09/19/17  Yes Truitt Merle, MD  lidocaine-prilocaine (EMLA) cream Apply to affected area once 08/05/17  Yes Truitt Merle, MD  metFORMIN (GLUCOPHAGE) 500 MG tablet Take 1 tablet (500 mg total) by mouth 2 (two) times daily with a meal. 09/05/17  Yes [name], NP  ondansetron (ZOFRAN) 8 MG tablet Take 1 tablet (8 mg total) by mouth every 8 (eight) hours as needed for nausea or vomiting. 09/19/17  Yes Truitt Merle, MD  prochlorperazine (COMPAZINE) 10 MG tablet Take 1 tablet (10 mg total) by mouth every 6 (six) hours as needed for nausea or vomiting. 09/19/17  Yes Truitt Merle, MD  dexamethasone (DECADRON) 4 MG tablet Take 1 tablet (4 mg total) by mouth 2 (two) times daily with a meal. Take one tablet twice a day - day before chemo and day after chemo 08/26/17   Truitt Merle, MD    ibuprofen (ADVIL,MOTRIN) 200 MG tablet Take 400 mg by mouth daily as needed for mild pain or moderate pain.    [provider]  traMADol (ULTRAM) 50 MG tablet Take 1 tablet (50 mg total) by mouth every 6 (six) hours as needed. Patient taking differently: Take 50 mg by mouth every 6 (six) hours as needed for moderate pain.  08/14/17   [name], NP    Family History Family History  Problem Relation Age of Onset  . Hypertension Mother   . Diabetes Father   . Hypertension Father     Social History Social History   Tobacco Use  . Smoking status: Never Smoker  . Smokeless tobacco: Never Used  Substance Use Topics  . Alcohol use: Yes    Comment: occassionally  . Drug use: No     Allergies   Patient has no known allergies.   Review of Systems Review of Systems  All other systems reviewed and are negative.    Physical Exam Updated Vital Signs BP (!) 136/111 (BP Location: Right Arm)   Pulse (!) 140   Temp (!) 103.1 F (39.5 C) (Oral)   Resp (!) 26   Ht 1.702 m (5\' 7" )   Wt (!) 149.7 kg (330 lb)   SpO2 100%   BMI 51.69 kg/m   Physical Exam  Constitutional: She is oriented to person, place, and time. She appears well-developed and well-nourished. She does not appear ill.  HENT:  Head: Normocephalic and atraumatic.  Mouth/Throat: Oropharynx is clear and moist.  Eyes: Pupils are equal, round, and reactive to light.  Neck: Normal range of motion.  Cardiovascular: Tachycardia present.  Pulmonary/Chest: Effort normal and breath sounds normal.  Chest wall with well-healed site of port without tenderness or erythema at port Right breast is mildly erythematous not warm or streaking   Abdominal: Soft. Bowel sounds are normal. She exhibits no mass. There is no tenderness. There is no guarding.  Musculoskeletal: Normal range of motion.  Neurological: She is alert and oriented to person, place, and time.  Skin: Skin is warm and dry. Capillary refill takes less  than 2 seconds.  Psychiatric: She has a normal mood and affect.  Nursing note and vitals reviewed.    ED Treatments / Results  Labs (all labs ordered are listed, but only abnormal results are displayed) Labs Reviewed  CULTURE, BLOOD (ROUTINE X 2)  CULTURE, BLOOD (ROUTINE X 2)  COMPREHENSIVE METABOLIC PANEL  CBC WITH DIFFERENTIAL/PLATELET  PROTIME-INR  URINALYSIS, ROUTINE W REFLEX MICROSCOPIC  I-STAT CG4 LACTIC ACID, ED  I-STAT BETA HCG BLOOD,  ED (MC, [facility], AP ONLY)  I-STAT CG4 LACTIC ACID, ED    EKG None  Radiology No results found.  Procedures Procedures (including critical care time)  Medications Ordered in ED Medications  sodium chloride 0.9 % bolus 1,000 mL (has no administration in time range)    And  sodium chloride 0.9 % bolus 1,000 mL (has no administration in time range)  piperacillin-tazobactam (ZOSYN) IVPB 3.375 g (has no administration in time range)  vancomycin (VANCOCIN) IVPB 1000 mg/200 mL premix (has no administration in time range)     Initial Impression / Assessment and Plan / ED Course  I have reviewed the triage vital signs and the nursing notes.  Pertinent labs & imaging results that were available during my care of the patient were reviewed by me and considered in my medical decision making (see chart for details).     6:44 PM First lactic acid elevated at 3 Patient remains tachycardic at 135, blood pressure 136/111 White blood cell count 1600 differential pending 7:13 PM She continues tachycardic.  IV fluids infusing.  Awaiting differential.  Signed out to Dr. Thurnell Garbe.  Patient may require stepdown or ICU admission.  Plan to continue to monitor blood pressure and heart rate.  If patient has low ANC, consider higher level of care. CRITICAL CARE Performed by: Pattricia Boss Total critical care time: 45 minutes Critical care time was exclusive of separately billable procedures and treating other patients. Critical care was necessary to treat or  prevent imminent or life-threatening deterioration. Critical care was time spent personally by me on the following activities: development of treatment plan with patient and/or surrogate as well as nursing, discussions with consultants, evaluation of patient's response to treatment, examination of patient, obtaining history from patient or surrogate, ordering and performing treatments and interventions, ordering and review of laboratory studies, ordering and review of radiographic studies, pulse oximetry and re-evaluation of patient's condition.  Final Clinical Impressions(s) / ED Diagnoses   Final diagnoses:  Sepsis, due to unspecified organism [facility])    ED Discharge Orders    None       Pattricia Boss, MD 09/26/17 1914

## 2017-09-27 DIAGNOSIS — Z170 Estrogen receptor positive status [ER+]: Secondary | ICD-10-CM

## 2017-09-27 DIAGNOSIS — A419 Sepsis, unspecified organism: Principal | ICD-10-CM

## 2017-09-27 DIAGNOSIS — E089 Diabetes mellitus due to underlying condition without complications: Secondary | ICD-10-CM

## 2017-09-27 DIAGNOSIS — C50211 Malignant neoplasm of upper-inner quadrant of right female breast: Secondary | ICD-10-CM

## 2017-09-27 DIAGNOSIS — R5081 Fever presenting with conditions classified elsewhere: Secondary | ICD-10-CM

## 2017-09-27 DIAGNOSIS — D709 Neutropenia, unspecified: Secondary | ICD-10-CM

## 2017-09-27 LAB — BASIC METABOLIC PANEL
ANION GAP: 7 (ref 5–15)
BUN: 10 mg/dL (ref 6–20)
CO2: 26 mmol/L (ref 22–32)
Calcium: 8.1 mg/dL — ABNORMAL LOW (ref 8.9–10.3)
Chloride: 105 mmol/L (ref 101–111)
Creatinine, Ser: 0.82 mg/dL (ref 0.44–1.00)
GFR calc Af Amer: >60 mL/min (ref >60)
GLUCOSE: 128 mg/dL — AB (ref 65–99)
POTASSIUM: 3 mmol/L — AB (ref 3.5–5.1)
Sodium: 138 mmol/L (ref 135–145)

## 2017-09-27 LAB — GLUCOSE, CAPILLARY
GLUCOSE-CAPILLARY: 85 mg/dL (ref 65–99)
Glucose-Capillary: 97 mg/dL (ref 65–99)
Glucose-Capillary: 74 mg/dL (ref 65–99)
Glucose-Capillary: 133 mg/dL — ABNORMAL HIGH (ref 65–99)

## 2017-09-27 LAB — CBC
HEMATOCRIT: 30.2 % — AB (ref 36.0–46.0)
Hemoglobin: 10.1 g/dL — ABNORMAL LOW (ref 12.0–15.0)
MCH: 26.9 pg (ref 26.0–34.0)
MCHC: 33.4 g/dL (ref 30.0–36.0)
MCV: 80.5 fL (ref 78.0–100.0)
PLATELETS: 188 10*3/uL (ref 150–400)
RBC: 3.75 MIL/uL — AB (ref 3.87–5.11)
RDW: 15.5 % (ref 11.5–15.5)
WBC: 1.8 10*3/uL — AB (ref 4.0–10.5)

## 2017-09-27 LAB — MAGNESIUM: MAGNESIUM: 1.5 mg/dL — AB (ref 1.7–2.4)

## 2017-09-27 LAB — LACTIC ACID, PLASMA: Lactic Acid, Venous: 0.8 mmol/L (ref 0.5–1.9)

## 2017-09-27 LAB — C DIFFICILE QUICK SCREEN W PCR REFLEX
C Diff antigen: NEGATIVE
C Diff interpretation: NOT DETECTED
C Diff toxin: NEGATIVE

## 2017-09-27 LAB — HIV ANTIBODY (ROUTINE TESTING W REFLEX): HIV Screen 4th Generation wRfx: NONREACTIVE

## 2017-09-27 MED ORDER — POTASSIUM CHLORIDE CRYS ER 20 MEQ PO TBCR
40.0000 meq | EXTENDED_RELEASE_TABLET | ORAL | Status: AC
  Administered 2017-09-27 (×2): 40 meq via ORAL
  Filled 2017-09-27 (×2): qty 2

## 2017-09-27 MED ORDER — MAGNESIUM SULFATE 2 GM/50ML IV SOLN
2.0000 g | Freq: Once | INTRAVENOUS | Status: AC
  Administered 2017-09-27: 2 g via INTRAVENOUS
  Filled 2017-09-27: qty 50

## 2017-09-27 MED ORDER — SODIUM CHLORIDE 0.9% FLUSH: 10.0000 mL | INTRAVENOUS | Status: DC | PRN

## 2017-09-27 MED ORDER — SODIUM CHLORIDE 0.9% FLUSH
10.0000 mL | Freq: Two times a day (BID) | INTRAVENOUS | Status: DC
  Administered 2017-09-28: 10 mL

## 2017-09-27 MED ORDER — VANCOMYCIN HCL 10 G IV SOLR
1500.0000 mg | Freq: Once | INTRAVENOUS | Status: AC
  Administered 2017-09-27: 1500 mg via INTRAVENOUS
  Filled 2017-09-27: qty 1500

## 2017-09-27 MED ORDER — CHLORHEXIDINE GLUCONATE CLOTH 2 % EX PADS
6.0000 | MEDICATED_PAD | Freq: Every day | CUTANEOUS | Status: DC
  Administered 2017-09-28: 6 via TOPICAL

## 2017-09-27 MED ORDER — TBO-FILGRASTIM 480 MCG/0.8ML ~~LOC~~ SOSY
480.0000 ug | PREFILLED_SYRINGE | Freq: Every day | SUBCUTANEOUS | Status: DC
  Administered 2017-09-27: 480 ug via SUBCUTANEOUS
  Filled 2017-09-27: qty 0.8

## 2017-09-27 MED ORDER — PIPERACILLIN-TAZOBACTAM 3.375 G IVPB
3.3750 g | Freq: Three times a day (TID) | INTRAVENOUS | Status: DC
  Administered 2017-09-27 – 2017-09-28 (×4): 3.375 g via INTRAVENOUS
  Filled 2017-09-27 (×6): qty 50

## 2017-09-27 MED ORDER — VANCOMYCIN HCL 10 G IV SOLR
1750.0000 mg | INTRAVENOUS | Status: DC
  Administered 2017-09-27: 1750 mg via INTRAVENOUS
  Filled 2017-09-27 (×2): qty 1750

## 2017-09-27 NOTE — Progress Notes (Signed)
Pharmacy Antibiotic Note  Caitlin Cohen is a 52 y.o. female admitted on 09/26/2017 with febrile neutopenia.  Pharmacy has been consulted for zosyn and vancomycin dosing.  Plan: Zosyn 3.375g IV q8h (4 hour infusion).  Vancomycin 1 Gm x1 then 1500 mg x1 = 2500 mg load then 1750 mg IV q24h for est AUC = 484 Goal AUC = 400-500 Daily Scr F/u cultures/levels  Height: 5\' 7"  (170.2 cm) Weight: (!) 330 lb (149.7 kg) IBW/kg (Calculated) : 61.6  Temp (24hrs), Avg:101.2 F (38.4 C), Min:98.9 F (37.2 C), Max:103.1 F (39.5 C)  Recent Labs  Lab 09/26/17 1812 09/26/17 1824 09/26/17 2200  WBC 1.6*  --   --   CREATININE 1.01*  --   --   LATICACIDVEN  --  3.10* 0.9    Estimated Creatinine Clearance: 100.7 mL/min (A) (by C-G formula based on SCr of 1.01 mg/dL (H)).    No Known Allergies  Antimicrobials this admission: 6/14 zosyn >>  6/14 vancomycin >>   Dose adjustments this admission:   Microbiology results:  BCx:   UCx:    Sputum:    MRSA PCR:   Thank you for allowing pharmacy to be a part of this patient's care.  Dorrene German 09/27/2017 2:01 AM

## 2017-09-27 NOTE — Progress Notes (Signed)
PROGRESS NOTE    Caitlin Cohen   XBM:[id number]  DOB: [birth year]  DOA: 09/26/2017 PCP: Lucianne Lei, MD   Brief Narrative:  Caitlin Cohen is a 52 y/o with Right Breast CA w/p mastectomy on chemo. She received her 3rd cycle of chemo and Granix subsequent to this. She had diarrhea but this resolved on Thursday. She presented for ongoing weakness and was found to be febrile with temp of 103 and 0 neutrophils. Admitted for neutropenic fever.    Subjective: No nausea, vomiting or diarrhea. No fever or chills. Urinating well without dysuria.     Assessment & Plan:   Principal Problem:   Febrile neutropenia/ sepsis - daily Granix until counts improve - VAnc and Zosyn - f.u cultures - d/c stool studies as she has no diarrhea for 3 days now - cont IVF but decrease rate from 125 to 75 cc/hr  Active Problems:  Hypokalemia/ Hypomagnesemia - replacing    Malignant neoplasm of upper-inner quadrant of right breast in female, estrogen receptor positive ([place]) - has had 3 cycles of chemo - Dr Burr Medico  DM2 - A1c 6.7 on 09/05/17 - SSI - carb mod diet- Metformin at home  Depression/ anxiety - Cymbalta    DVT prophylaxis: Lovenox Code Status: Full code Family Communication:  Disposition Plan: home when fevers resolve Consultants:   none Procedures:   none Antimicrobials:  Anti-infectives (From admission, onward)   Start     Dose/Rate Route Frequency Ordered Stop   09/27/17 2000  vancomycin (VANCOCIN) 1,750 mg in sodium chloride 0.9 % 500 mL IVPB     1,750 mg 250 mL/hr over 120 Minutes Intravenous Every 24 hours 09/27/17 0221     09/27/17 0200  piperacillin-tazobactam (ZOSYN) IVPB 3.375 g     3.375 g 12.5 mL/hr over 240 Minutes Intravenous Every 8 hours 09/27/17 0152     09/27/17 0200  vancomycin (VANCOCIN) 1,500 mg in sodium chloride 0.9 % 500 mL IVPB     1,500 mg 250 mL/hr over 120 Minutes Intravenous  Once 09/27/17 0157 09/27/17 0411   09/26/17 1800   piperacillin-tazobactam (ZOSYN) IVPB 3.375 g     3.375 g 100 mL/hr over 30 Minutes Intravenous  Once 09/26/17 1745 09/26/17 1853   09/26/17 1800  vancomycin (VANCOCIN) IVPB 1000 mg/200 mL premix     1,000 mg 200 mL/hr over 60 Minutes Intravenous  Once 09/26/17 1745 09/26/17 2007       Objective: Vitals:   09/27/17 0200 09/27/17 0356 09/27/17 0400 09/27/17 0812  BP: 127/66  111/79   Pulse: (!) 119  (!) 115   Resp: (!) 23  18   Temp:  99.1 F (37.3 C)  98.4 F (36.9 C)  TempSrc:  Oral  Oral  SpO2: 97%  97%   Weight:      Height:        Intake/Output Summary (Last 24 hours) at 09/27/2017 1032 Last data filed at 09/27/2017 0700 Gross per 24 hour  Intake 3376.88 ml  Output -  Net 3376.88 ml   Filed Weights   09/26/17 1711  Weight: (!) 149.7 kg (330 lb)    Examination: General exam: Appears comfortable  HEENT: PERRLA, oral mucosa moist, no sclera icterus or thrush Respiratory system: Clear to auscultation. Respiratory effort normal. Cardiovascular system: S1 & S2 heard, RRR.   Gastrointestinal system: Abdomen soft, non-tender, nondistended. Normal bowel sound. No organomegaly Central nervous system: Alert and oriented. No focal neurological deficits. Extremities: No cyanosis, clubbing or edema Skin: No  rashes or ulcers Psychiatry:  Mood & affect appropriate.     Data Reviewed: I have personally reviewed following labs and imaging studies  CBC: Recent Labs  Lab 09/26/17 1812 09/27/17 0150  WBC 1.6* 1.8*  NEUTROABS 0.0*  --   HGB 9.7* 10.1*  HCT 29.0* 30.2*  MCV 81.2 80.5  PLT 315 361   Basic Metabolic Panel: Recent Labs  Lab 09/26/17 1812 09/27/17 0150  NA 133* 138  K 3.1* 3.0*  CL 99* 105  CO2 24 26  GLUCOSE 157* 128*  BUN 14 10  CREATININE 1.01* 0.82  CALCIUM 8.8* 8.1*  MG  --  1.5*   GFR: Estimated Creatinine Clearance: 124 mL/min (by C-G formula based on SCr of 0.82 mg/dL). Liver Function Tests: Recent Labs  Lab 09/26/17 1812  AST 96*    ALT 136*  ALKPHOS 87  BILITOT 0.6  PROT 7.4  ALBUMIN 3.7   No results for input(s): LIPASE, AMYLASE in the last 168 hours. No results for input(s): AMMONIA in the last 168 hours. Coagulation Profile: Recent Labs  Lab 09/26/17 1812  INR 1.11   Cardiac Enzymes: No results for input(s): CKTOTAL, CKMB, CKMBINDEX, TROPONINI in the last 168 hours. BNP (last 3 results) No results for input(s): PROBNP in the last 8760 hours. HbA1C: No results for input(s): HGBA1C in the last 72 hours. CBG: Recent Labs  Lab 09/26/17 2211 09/27/17 0727  GLUCAP 108* 97   Lipid Profile: No results for input(s): CHOL, HDL, LDLCALC, TRIG, CHOLHDL, LDLDIRECT in the last 72 hours. Thyroid Function Tests: No results for input(s): TSH, T4TOTAL, FREET4, T3FREE, THYROIDAB in the last 72 hours. Anemia Panel: No results for input(s): VITAMINB12, FOLATE, FERRITIN, TIBC, IRON, RETICCTPCT in the last 72 hours. Urine analysis:    Component Value Date/Time   COLORURINE YELLOW 09/26/2017 2028   APPEARANCEUR HAZY (A) 09/26/2017 2028   LABSPEC 1.019 09/26/2017 2028   PHURINE 5.0 09/26/2017 2028   GLUCOSEU NEGATIVE 09/26/2017 2028   HGBUR MODERATE (A) 09/26/2017 2028   BILIRUBINUR NEGATIVE 09/26/2017 2028   KETONESUR NEGATIVE 09/26/2017 2028   PROTEINUR NEGATIVE 09/26/2017 2028   NITRITE NEGATIVE 09/26/2017 2028   LEUKOCYTESUR TRACE (A) 09/26/2017 2028   Sepsis Labs: @LABRCNTIP (procalcitonin:4,lacticidven:4) ) Recent Results (from the past 240 hour(s))  MRSA PCR Screening     Status: None   Collection Time: 09/26/17 10:08 PM  Result Value Ref Range Status   MRSA by PCR NEGATIVE NEGATIVE Final    Comment:        The GeneXpert MRSA Assay (FDA approved for NASAL specimens only), is one component of a comprehensive MRSA colonization surveillance program. It is not intended to diagnose MRSA infection nor to guide or monitor treatment for MRSA infections. Performed at [facility],  [place] [address], [place], [place] [address]          Radiology Studies: Dg Chest Port 1 View  Result Date: 09/26/2017 CLINICAL DATA:  Patient undergoing chemotherapy for breast cancer. Nausea and diarrhea since 09/22/2017. EXAM: PORTABLE CHEST 1 VIEW COMPARISON:  Single-view of the chest 07/30/2017. FINDINGS: The lungs are clear. Heart size is normal. No pneumothorax or pleural effusion. Port-A-Cath noted. IMPRESSION: Negative chest. Electronically Signed   By: Inge Rise M.D.   On: 09/26/2017 19:38      Scheduled Meds: . [START ON 09/28/2017] Chlorhexidine Gluconate Cloth  6 each Topical Daily  . DULoxetine  30 mg Oral Daily  . enoxaparin (LOVENOX) injection  40 mg Subcutaneous Q24H  .  insulin aspart  0-15 Units Subcutaneous TID WC  . potassium chloride  40 mEq Oral Q4H  . sodium chloride flush  10-40 mL Intracatheter Q12H   Continuous Infusions: . sodium chloride 75 mL/hr at 09/27/17 0912  . piperacillin-tazobactam (ZOSYN)  IV 3.375 g (09/27/17 0912)  . vancomycin       LOS: 1 day    Time spent in minutes: 35    Debbe Odea, MD [facility] Pager: [email or web address] Password [facility] 09/27/2017, 10:32 AM

## 2017-09-28 DIAGNOSIS — R197 Diarrhea, unspecified: Secondary | ICD-10-CM

## 2017-09-28 DIAGNOSIS — Z95828 Presence of other vascular implants and grafts: Secondary | ICD-10-CM

## 2017-09-28 DIAGNOSIS — E118 Type 2 diabetes mellitus with unspecified complications: Secondary | ICD-10-CM

## 2017-09-28 DIAGNOSIS — E119 Type 2 diabetes mellitus without complications: Secondary | ICD-10-CM

## 2017-09-28 LAB — GLUCOSE, CAPILLARY
Glucose-Capillary: 105 mg/dL — ABNORMAL HIGH (ref 65–99)
Glucose-Capillary: 98 mg/dL (ref 65–99)

## 2017-09-28 LAB — URINE CULTURE: Culture: <10000 — AB

## 2017-09-28 LAB — BASIC METABOLIC PANEL
Anion gap: 7 (ref 5–15)
BUN: 6 mg/dL (ref 6–20)
CALCIUM: 8.1 mg/dL — AB (ref 8.9–10.3)
CHLORIDE: 107 mmol/L (ref 101–111)
CO2: 26 mmol/L (ref 22–32)
Creatinine, Ser: 0.81 mg/dL (ref 0.44–1.00)
GFR calc non Af Amer: >60 mL/min (ref >60)
GLUCOSE: 102 mg/dL — AB (ref 65–99)
Potassium: 3.1 mmol/L — ABNORMAL LOW (ref 3.5–5.1)
Sodium: 140 mmol/L (ref 135–145)

## 2017-09-28 LAB — CBC WITH DIFFERENTIAL/PLATELET
BASOS ABS: 0 10*3/uL (ref 0.0–0.1)
Band Neutrophils: 14 %
Basophils Relative: 0 %
Eosinophils Absolute: 0 10*3/uL (ref 0.0–0.7)
Eosinophils Relative: 0 %
HEMATOCRIT: 29.7 % — AB (ref 36.0–46.0)
HEMOGLOBIN: 9.9 g/dL — AB (ref 12.0–15.0)
LYMPHS ABS: 1.4 10*3/uL (ref 0.7–4.0)
Lymphocytes Relative: 18 %
MCH: 26.8 pg (ref 26.0–34.0)
MCHC: 33.3 g/dL (ref 30.0–36.0)
MCV: 80.5 fL (ref 78.0–100.0)
METAMYELOCYTES PCT: 2 %
Monocytes Absolute: 0.3 10*3/uL (ref 0.1–1.0)
Monocytes Relative: 4 %
Myelocytes: 2 %
NEUTROS ABS: 6.1 10*3/uL (ref 1.7–7.7)
Neutrophils Relative %: 60 %
Platelets: 158 10*3/uL (ref 150–400)
RBC: 3.69 MIL/uL — AB (ref 3.87–5.11)
RDW: 15.6 % — AB (ref 11.5–15.5)
WBC: 7.8 10*3/uL (ref 4.0–10.5)

## 2017-09-28 LAB — GASTROINTESTINAL PANEL BY PCR, STOOL (REPLACES STOOL CULTURE)
Adenovirus F40/41: NOT DETECTED
Astrovirus: NOT DETECTED
CAMPYLOBACTER SPECIES: NOT DETECTED
CRYPTOSPORIDIUM: NOT DETECTED
CYCLOSPORA CAYETANENSIS: NOT DETECTED
ENTAMOEBA HISTOLYTICA: NOT DETECTED
Enteroaggregative E coli (EAEC): NOT DETECTED
Enteropathogenic E coli (EPEC): NOT DETECTED
Enterotoxigenic E coli (ETEC): NOT DETECTED
Giardia lamblia: NOT DETECTED
Norovirus GI/GII: NOT DETECTED
PLESIMONAS SHIGELLOIDES: NOT DETECTED
Rotavirus A: NOT DETECTED
SAPOVIRUS (I, II, IV, AND V): NOT DETECTED
SHIGA LIKE TOXIN PRODUCING E COLI (STEC): NOT DETECTED
Salmonella species: NOT DETECTED
Shigella/Enteroinvasive E coli (EIEC): NOT DETECTED
VIBRIO SPECIES: NOT DETECTED
Vibrio cholerae: NOT DETECTED
YERSINIA ENTEROCOLITICA: NOT DETECTED

## 2017-09-28 LAB — MAGNESIUM: MAGNESIUM: 1.8 mg/dL (ref 1.7–2.4)

## 2017-09-28 MED ORDER — DOXYCYCLINE MONOHYDRATE 100 MG PO TABS: 100.0000 mg | ORAL_TABLET | Freq: Two times a day (BID) | ORAL | 0 refills | Status: DC

## 2017-09-28 MED ORDER — AMOXICILLIN-POT CLAVULANATE 875-125 MG PO TABS: 1.0000 | ORAL_TABLET | Freq: Two times a day (BID) | ORAL | 0 refills | Status: AC

## 2017-09-28 MED ORDER — GLUCERNA SHAKE PO LIQD
237.0000 mL | Freq: Three times a day (TID) | ORAL | Status: DC
Start: 2017-09-28 — End: 2017-09-28
  Filled 2017-09-28: qty 237

## 2017-09-28 MED ORDER — POTASSIUM CHLORIDE CRYS ER 20 MEQ PO TBCR
40.0000 meq | EXTENDED_RELEASE_TABLET | ORAL | Status: DC
  Administered 2017-09-28: 40 meq via ORAL
  Filled 2017-09-28: qty 2

## 2017-09-28 MED ORDER — GLUCERNA SHAKE PO LIQD: 237.0000 mL | Freq: Three times a day (TID) | ORAL | 3 refills | Status: DC

## 2017-09-28 NOTE — Progress Notes (Signed)
Patient discharged to home, all discharge medications and instructions reviewed and questions answered.  Patient to be assisted to vehicle by wheelchair.  

## 2017-09-28 NOTE — Discharge Summary (Signed)
Physician Discharge Summary  Caitlin Cohen DTO:[id number] DOB: 03/09/1966 DOA: 09/26/2017  PCP: Lucianne Lei, MD  Admit date: 09/26/2017 Discharge date: 09/28/2017  Admitted From: home  Disposition:  home    Discharge Condition:  stable   CODE STATUS:  Full code   Diet recommendation:  Regular diet Consultations:  none    Discharge Diagnoses:  Principal Problem:   Febrile neutropenia ([place]) Active Problems:   Malignant neoplasm of upper-inner quadrant of right breast in female, estrogen receptor positive ([place])   Port-A-Cath in place   Sepsis ([place])   Diarrhea   DM (diabetes mellitus), type 2 ([place])      Brief Summary: Caitlin Cohen is a 52 y/o with Right Breast CA w/p mastectomy on chemo. She received her 3rd cycle of chemo and Granix subsequent to this. She had diarrhea but this resolved on Thursday. She presented for ongoing weakness and was found to be febrile with temp of 103 and 0 neutrophils. Admitted for neutropenic fever.   Hospital Course:  Principal Problem:   Febrile neutropenia/ sepsis - Granix given x 2 days in hospital- WBC today 7.8 with 6.1 neutrophils - VAnc and Zosyn given in hospital - fever has not recurred -  Blood cultures have been negative - U culture showing < 10K unidentified colonies -  She had diarrhea yesterday without abdominal pain or vomiting- C diff is negative- recommended to take Lomotil as needed- she has a prescription at home - drinking fluids and able to maintain hydration - Glucerna prescribed if she has poor oral intake - will transition to Augmentin and Doxy today to complete 5 days of antibiotics - she is advised to check for fever 2 x day and if it recurs, to come back to the ED  Active Problems:  Hypokalemia/ Hypomagnesemia - replaced Mag- replacing K    Malignant neoplasm of upper-inner quadrant of right breast in female, estrogen receptor positive ([place]) - has had 3 cycles of chemo - Dr Burr Medico  DM2 - A1c 6.7 on  09/05/17 - SSI - carb mod diet- Metformin at home  Depression/ anxiety - Cymbalta      Discharge Exam: Vitals:   09/27/17 2122 09/28/17 0517  BP: 118/79 (!) 141/71  Pulse: (!) 104 (!) 101  Resp: 14 18  Temp: 98.4 F (36.9 C) 98.5 F (36.9 C)  SpO2: 100% 99%   Vitals:   09/27/17 1144 09/27/17 1803 09/27/17 2122 09/28/17 0517  BP: 126/79 (!) 141/55 118/79 (!) 141/71  Pulse: 95 (!) 101 (!) 104 (!) 101  Resp: 17 18 14 18   Temp: 98.4 F (36.9 C) 98.5 F (36.9 C) 98.4 F (36.9 C) 98.5 F (36.9 C)  TempSrc: Oral Oral Oral Oral  SpO2: 99% 100% 100% 99%  Weight:      Height:        General: Pt is alert, awake, not in acute distress Cardiovascular: RRR, S1/S2 +, no rubs, no gallops Respiratory: CTA bilaterally, no wheezing, no rhonchi Abdominal: Soft, NT, ND, bowel sounds + Extremities: no edema, no cyanosis   Discharge Instructions  Discharge Instructions    Diet - low sodium heart healthy   Complete by:  As directed    Discharge instructions   Complete by:  As directed    Please check your temperature 2 x day and if fever returns, come back to the ED. Take Lomotil for diarrhea.  Drink at least 2 L of water a day.   Increase activity slowly   Complete by:  As directed      Allergies as of 09/28/2017   No Known Allergies     Medication List    TAKE these medications   acetaminophen-codeine 300-30 MG tablet Commonly known as:  TYLENOL #3 Take 1 tablet by mouth every 8 (eight) hours as needed for moderate pain.   amoxicillin-clavulanate 875-125 MG tablet Commonly known as:  AUGMENTIN Take 1 tablet by mouth every 12 (twelve) hours for 10 days.   dexamethasone 4 MG tablet Commonly known as:  DECADRON Take 1 tablet (4 mg total) by mouth 2 (two) times daily with a meal. Take one tablet twice a day - day before chemo and day after chemo   diphenoxylate-atropine 2.5-0.025 MG tablet Commonly known as:  LOMOTIL Take 1-2 tablets by mouth 4 (four) times daily as  needed for diarrhea or loose stools.   doxycycline 100 MG tablet Commonly known as:  ADOXA Take 1 tablet (100 mg total) by mouth 2 (two) times daily.   DULoxetine 30 MG capsule Commonly known as:  CYMBALTA Take 1 capsule (30 mg total) by mouth daily.   feeding supplement (GLUCERNA SHAKE) Liqd Take 237 mLs by mouth 3 (three) times daily between meals.   ibuprofen 200 MG tablet Commonly known as:  ADVIL,MOTRIN Take 400 mg by mouth daily as needed for mild pain or moderate pain.   lidocaine-prilocaine cream Commonly known as:  EMLA Apply to affected area once   metFORMIN 500 MG tablet Commonly known as:  GLUCOPHAGE Take 1 tablet (500 mg total) by mouth 2 (two) times daily with a meal.   ondansetron 8 MG tablet Commonly known as:  ZOFRAN Take 1 tablet (8 mg total) by mouth every 8 (eight) hours as needed for nausea or vomiting.   prochlorperazine 10 MG tablet Commonly known as:  COMPAZINE Take 1 tablet (10 mg total) by mouth every 6 (six) hours as needed for nausea or vomiting.   traMADol 50 MG tablet Commonly known as:  ULTRAM Take 1 tablet (50 mg total) by mouth every 6 (six) hours as needed. What changed:  reasons to take this      Follow-up Information    Lucianne Lei, MD Follow up in 1 week(s).   Specialty:  Family Medicine Contact information: [address] [place] [address] [phone]          No Known Allergies   Procedures/Studies:   Dg Chest Port 1 View  Result Date: 09/26/2017 CLINICAL DATA:  Patient undergoing chemotherapy for breast cancer. Nausea and diarrhea since 09/22/2017. EXAM: PORTABLE CHEST 1 VIEW COMPARISON:  Single-view of the chest 07/30/2017. FINDINGS: The lungs are clear. Heart size is normal. No pneumothorax or pleural effusion. Port-A-Cath noted. IMPRESSION: Negative chest. Electronically Signed   By: Inge Rise M.D.   On: 09/26/2017 19:38     The results of significant diagnostics from this hospitalization  (including imaging, microbiology, ancillary and laboratory) are listed below for reference.     Microbiology: Recent Results (from the past 240 hour(s))  Culture, blood (Routine x 2)     Status: None (Preliminary result)   Collection Time: 09/26/17  6:12 PM  Result Value Ref Range Status   Specimen Description   Final    BLOOD RIGHT CHEST Performed at [facility], [place] [address], [place], [place] [address]    Special Requests   Final    BOTTLES DRAWN AEROBIC AND ANAEROBIC Blood Culture adequate volume Performed at [facility]  [address], [place], [place] [address]    Culture   Final    NO GROWTH < 24 HOURS Performed at [facility] [address], [place], [place] [address]    Report Status PENDING  Incomplete  Culture, blood (Routine x 2)     Status: None (Preliminary result)   Collection Time: 09/26/17  6:12 PM  Result Value Ref Range Status   Specimen Description   Final    BLOOD LEFT ANTECUBITAL Performed at [facility] [address] [place], [place] [address]    Special Requests   Final    BOTTLES DRAWN AEROBIC AND ANAEROBIC Blood Culture adequate volume Performed at [facility] [address] [place], [place] [address]    Culture   Final    NO GROWTH < 24 HOURS Performed at [employer] [address] [place], [place] [address]    Report Status PENDING  Incomplete  Culture, Urine     Status: Abnormal   Collection Time: 09/26/17  8:27 PM  Result Value Ref Range Status   Specimen Description   Final    URINE, RANDOM Performed at [facility] [address] [place], [place] [address]    Special Requests   Final    NONE Performed at [facility], [place] [address], [place], [place] [address]    Culture (A)  Final    <10,000 COLONIES/mL INSIGNIFICANT GROWTH Performed at [facility] [address], [place], [place]  [address]    Report Status 09/28/2017 FINAL  Final  MRSA PCR Screening     Status: None   Collection Time: 09/26/17 10:08 PM  Result Value Ref Range Status   MRSA by PCR NEGATIVE NEGATIVE Final    Comment:        The GeneXpert MRSA Assay (FDA approved for NASAL specimens only), is one component of a comprehensive MRSA colonization surveillance program. It is not intended to diagnose MRSA infection nor to guide or monitor treatment for MRSA infections. Performed at [facility], [place] [address] [place], [place] [address]   C difficile quick scan w PCR reflex     Status: None   Collection Time: 09/27/17  9:36 AM  Result Value Ref Range Status   C Diff antigen NEGATIVE NEGATIVE Final   C Diff toxin NEGATIVE NEGATIVE Final   C Diff interpretation No C. difficile detected.  Final    Comment: Performed at [facility], [place] [address], [place],  [address]     Labs: BNP (last 3 results) No results for input(s): BNP in the last 8760 hours. Basic Metabolic Panel: Recent Labs  Lab 09/26/17 1812 09/27/17 0150 09/28/17 0500  NA 133* 138 140  K 3.1* 3.0* 3.1*  CL 99* 105 107  CO2 24 26 26   GLUCOSE 157* 128* 102*  BUN 14 10 6   CREATININE 1.01* 0.82 0.81  CALCIUM 8.8* 8.1* 8.1*  MG  --  1.5* 1.8   Liver Function Tests: Recent Labs  Lab 09/26/17 1812  AST 96*  ALT 136*  ALKPHOS 87  BILITOT 0.6  PROT 7.4  ALBUMIN 3.7   No results for input(s): LIPASE, AMYLASE in the last 168 hours. No results for input(s): AMMONIA in the last 168 hours. CBC: Recent Labs  Lab 09/26/17 1812 09/27/17 0150 09/28/17 0500  WBC 1.6* 1.8* 7.8  NEUTROABS 0.0*  --  6.1  HGB 9.7* 10.1* 9.9*  HCT  29.0* 30.2* 29.7*  MCV 81.2 80.5 80.5  PLT 315 188 158   Cardiac Enzymes: No results for input(s): CKTOTAL, CKMB, CKMBINDEX, TROPONINI in the last 168 hours. BNP: Invalid input(s): POCBNP CBG: Recent Labs  Lab 09/27/17 0727 09/27/17 1153  09/27/17 1726 09/27/17 2118 09/28/17 0737  GLUCAP 97 85 74 133* 105*   D-Dimer No results for input(s): DDIMER in the last 72 hours. Hgb A1c No results for input(s): HGBA1C in the last 72 hours. Lipid Profile No results for input(s): CHOL, HDL, LDLCALC, TRIG, CHOLHDL, LDLDIRECT in the last 72 hours. Thyroid function studies No results for input(s): TSH, T4TOTAL, T3FREE, THYROIDAB in the last 72 hours.  Invalid input(s): FREET3 Anemia work up No results for input(s): VITAMINB12, FOLATE, FERRITIN, TIBC, IRON, RETICCTPCT in the last 72 hours. Urinalysis    Component Value Date/Time   COLORURINE YELLOW 09/26/2017 2028   APPEARANCEUR HAZY (A) 09/26/2017 2028   LABSPEC 1.019 09/26/2017 2028   PHURINE 5.0 09/26/2017 2028   GLUCOSEU NEGATIVE 09/26/2017 2028   HGBUR MODERATE (A) 09/26/2017 2028   BILIRUBINUR NEGATIVE 09/26/2017 2028   KETONESUR NEGATIVE 09/26/2017 2028   PROTEINUR NEGATIVE 09/26/2017 2028   NITRITE NEGATIVE 09/26/2017 2028   LEUKOCYTESUR TRACE (A) 09/26/2017 2028   Sepsis Labs Invalid input(s): PROCALCITONIN,  WBC,  LACTICIDVEN Microbiology Recent Results (from the past 240 hour(s))  Culture, blood (Routine x 2)     Status: None (Preliminary result)   Collection Time: 09/26/17  6:12 PM  Result Value Ref Range Status   Specimen Description   Final    BLOOD RIGHT CHEST Performed at [facility], [place] [address], [place], [place] [address]    Special Requests   Final    BOTTLES DRAWN AEROBIC AND ANAEROBIC Blood Culture adequate volume Performed at [facility] [address], [place], [place] [address]    Culture   Final    NO GROWTH < 24 HOURS Performed at [facility] [address] [place], [place] [address]    Report Status PENDING  Incomplete  Culture, blood (Routine x 2)     Status: None (Preliminary result)   Collection Time: 09/26/17  6:12 PM  Result Value Ref Range Status   Specimen Description   Final     BLOOD LEFT ANTECUBITAL Performed at [facility] [address], [place], [place] [address]    Special Requests   Final    BOTTLES DRAWN AEROBIC AND ANAEROBIC Blood Culture adequate volume Performed at [facility] [address], [place], [place] [address]    Culture   Final    NO GROWTH < 24 HOURS Performed at [facility] [address], [place], [place] [address]    Report Status PENDING  Incomplete  Culture, Urine     Status: Abnormal   Collection Time: 09/26/17  8:27 PM  Result Value Ref Range Status   Specimen Description   Final    URINE, RANDOM Performed at [facility] [address] [place], [place] [address]    Special Requests   Final    NONE Performed at [facility], [place] [address], [place], [place] [address]    Culture (A)  Final    <10,000 COLONIES/mL INSIGNIFICANT GROWTH Performed at [facility] [facility] [address] [place], [place] [address]    Report Status 09/28/2017 FINAL  Final  MRSA PCR Screening     Status: None  Collection Time: 09/26/17 10:08 PM  Result Value Ref Range Status   MRSA by PCR NEGATIVE NEGATIVE Final    Comment:        The GeneXpert MRSA Assay (FDA approved for NASAL specimens only), is one component of a comprehensive MRSA colonization surveillance program. It is not intended to diagnose MRSA infection nor to guide or monitor treatment for MRSA infections. Performed at [facility], [place] [address] [place], [place] [address]   C difficile quick scan w PCR reflex     Status: None   Collection Time: 09/27/17  9:36 AM  Result Value Ref Range Status   C Diff antigen NEGATIVE NEGATIVE Final   C Diff toxin NEGATIVE NEGATIVE Final   C Diff interpretation No C. difficile detected.  Final    Comment: Performed at [facility], [place] [address], [place], [place] [address]     Time coordinating discharge  in minutes: 29  SIGNED:   Debbe Odea, MD  [facility] 09/28/2017, 10:03 AM Pager   If 7PM-7AM, please contact night-coverage [email or web address] Password [email or web address]

## 2017-09-28 NOTE — Discharge Instructions (Signed)

## 2017-09-29 ENCOUNTER — Telehealth: Payer: Self-pay | Admitting: Hematology

## 2017-09-29 ENCOUNTER — Telehealth: Payer: Self-pay | Admitting: *Deleted

## 2017-09-29 NOTE — Telephone Encounter (Signed)
Could you please offer her lab, f/u with me this Friday 6/21 to see how she is recovering. If she still feels weak please also add on 2 hour IVF.  Thanks,  Regan Rakers NP

## 2017-09-29 NOTE — Telephone Encounter (Signed)
Thank you :)

## 2017-09-29 NOTE — Telephone Encounter (Signed)
Called pt re appts that were added per 6/17 sch msg - spoke w/ pt re appts.

## 2017-09-29 NOTE — Telephone Encounter (Signed)
Spoke with pt and offered appts for Friday as per NP.   Pt agreed.  Schedule message sent.

## 2017-09-29 NOTE — Telephone Encounter (Signed)
Pt called requesting a call back from nurse.  Spoke with pt, and was informed that pt was in ER on Friday  09/26/17.  Stated she had fever of   103.4 ; pt was admitted and was given ABXs   - cause unknown per pt.  Pt was discharged home yesterday 09/28/17 with Amoxicillin, and  Doxycycline po for 10 days.   Pt wanted to make Dr. Burr Medico aware. Pt's    Phone      [phone].

## 2017-10-01 LAB — CULTURE, BLOOD (ROUTINE X 2)
CULTURE: NO GROWTH
Culture: NO GROWTH
Special Requests: ADEQUATE
Special Requests: ADEQUATE

## 2017-10-03 ENCOUNTER — Inpatient Hospital Stay: Payer: BLUE CROSS/BLUE SHIELD

## 2017-10-03 ENCOUNTER — Inpatient Hospital Stay (HOSPITAL_BASED_OUTPATIENT_CLINIC_OR_DEPARTMENT_OTHER): Payer: BLUE CROSS/BLUE SHIELD | Admitting: Nurse Practitioner

## 2017-10-03 ENCOUNTER — Telehealth: Payer: Self-pay | Admitting: Nurse Practitioner

## 2017-10-03 ENCOUNTER — Encounter: Payer: Self-pay | Admitting: Nurse Practitioner

## 2017-10-03 VITALS — BP 132/86 | HR 103 | Temp 98.6°F | Resp 18 | Ht 67.0 in | Wt 326.2 lb

## 2017-10-03 DIAGNOSIS — F418 Other specified anxiety disorders: Secondary | ICD-10-CM

## 2017-10-03 DIAGNOSIS — C50411 Malignant neoplasm of upper-outer quadrant of right female breast: Secondary | ICD-10-CM

## 2017-10-03 DIAGNOSIS — E876 Hypokalemia: Secondary | ICD-10-CM

## 2017-10-03 DIAGNOSIS — Z5111 Encounter for antineoplastic chemotherapy: Secondary | ICD-10-CM | POA: Diagnosis not present

## 2017-10-03 DIAGNOSIS — Z170 Estrogen receptor positive status [ER+]: Secondary | ICD-10-CM

## 2017-10-03 DIAGNOSIS — R739 Hyperglycemia, unspecified: Secondary | ICD-10-CM

## 2017-10-03 DIAGNOSIS — C50211 Malignant neoplasm of upper-inner quadrant of right female breast: Secondary | ICD-10-CM

## 2017-10-03 DIAGNOSIS — M898X9 Other specified disorders of bone, unspecified site: Secondary | ICD-10-CM

## 2017-10-03 DIAGNOSIS — T380X5A Adverse effect of glucocorticoids and synthetic analogues, initial encounter: Secondary | ICD-10-CM

## 2017-10-03 DIAGNOSIS — Z5189 Encounter for other specified aftercare: Secondary | ICD-10-CM | POA: Diagnosis not present

## 2017-10-03 DIAGNOSIS — Z95828 Presence of other vascular implants and grafts: Secondary | ICD-10-CM

## 2017-10-03 LAB — CBC WITH DIFFERENTIAL (CANCER CENTER ONLY)
BASOS ABS: 0 10*3/uL (ref 0.0–0.1)
BASOS PCT: 0 %
EOS ABS: 0 10*3/uL (ref 0.0–0.5)
EOS PCT: 0 %
HCT: 32.9 % — ABNORMAL LOW (ref 34.8–46.6)
Hemoglobin: 10.8 g/dL — ABNORMAL LOW (ref 11.6–15.9)
Lymphocytes Relative: 27 %
Lymphs Abs: 1.8 10*3/uL (ref 0.9–3.3)
MCH: 26.5 pg (ref 25.1–34.0)
MCHC: 32.8 g/dL (ref 31.5–36.0)
MCV: 80.8 fL (ref 79.5–101.0)
Monocytes Absolute: 0.5 10*3/uL (ref 0.1–0.9)
Monocytes Relative: 7 %
Neutro Abs: 4.4 10*3/uL (ref 1.5–6.5)
Neutrophils Relative %: 66 %
Platelet Count: 225 10*3/uL (ref 145–400)
RBC: 4.07 MIL/uL (ref 3.70–5.45)
RDW: 16.7 % — ABNORMAL HIGH (ref 11.2–14.5)
WBC: 6.6 10*3/uL (ref 3.9–10.3)

## 2017-10-03 LAB — CMP (CANCER CENTER ONLY)
ALT: 36 U/L (ref 0–55)
AST: 24 U/L (ref 5–34)
Albumin: 3.3 g/dL — ABNORMAL LOW (ref 3.5–5.0)
Alkaline Phosphatase: 76 U/L (ref 40–150)
Anion gap: 8 (ref 3–11)
BILIRUBIN TOTAL: 0.3 mg/dL (ref 0.2–1.2)
BUN: 9 mg/dL (ref 7–26)
CO2: 28 mmol/L (ref 22–29)
CREATININE: 0.8 mg/dL (ref 0.60–1.10)
Calcium: 9.2 mg/dL (ref 8.4–10.4)
Chloride: 104 mmol/L (ref 98–109)
Glucose, Bld: 123 mg/dL (ref 70–140)
Potassium: 2.7 mmol/L — CL (ref 3.5–5.1)
Sodium: 140 mmol/L (ref 136–145)
TOTAL PROTEIN: 6.9 g/dL (ref 6.4–8.3)

## 2017-10-03 MED ORDER — SODIUM CHLORIDE 0.9% FLUSH
10.0000 mL | INTRAVENOUS | Status: DC | PRN
  Administered 2017-10-03: 10 mL
  Filled 2017-10-03: qty 10

## 2017-10-03 MED ORDER — SODIUM CHLORIDE 0.9 % IV SOLN
Freq: Once | INTRAVENOUS | Status: DC
  Filled 2017-10-03: qty 1000

## 2017-10-03 MED ORDER — POTASSIUM CHLORIDE IN NACL 20-0.9 MEQ/L-% IV SOLN
Freq: Once | INTRAVENOUS | Status: AC
  Administered 2017-10-03: 13:00:00 via INTRAVENOUS
  Filled 2017-10-03: qty 1000

## 2017-10-03 MED ORDER — POTASSIUM CHLORIDE CRYS ER 20 MEQ PO TBCR: 20.0000 meq | EXTENDED_RELEASE_TABLET | Freq: Two times a day (BID) | ORAL | 0 refills | Status: DC

## 2017-10-03 MED ORDER — TRAMADOL HCL 50 MG PO TABS: 50.0000 mg | ORAL_TABLET | Freq: Four times a day (QID) | ORAL | 0 refills | Status: DC | PRN

## 2017-10-03 MED ORDER — SODIUM CHLORIDE 0.9 % IV SOLN: Freq: Once | INTRAVENOUS | Status: DC

## 2017-10-03 NOTE — Telephone Encounter (Signed)
Unable to schedule appt per 6/21 - due to cap - logged - will contact patient with appts if able to adjust.

## 2017-10-03 NOTE — Patient Instructions (Signed)
Implanted Port Home Guide An implanted port is a type of central line that is placed under the skin. Central lines are used to provide IV access when treatment or nutrition needs to be given through a person's veins. Implanted ports are used for long-term IV access. An implanted port may be placed because:  You need IV medicine that would be irritating to the small veins in your hands or arms.  You need long-term IV medicines, such as antibiotics.  You need IV nutrition for a long period.  You need frequent blood draws for lab tests.  You need dialysis.  Implanted ports are usually placed in the chest area, but they can also be placed in the upper arm, the abdomen, or the leg. An implanted port has two main parts:  Reservoir. The reservoir is round and will appear as a small, raised area under your skin. The reservoir is the part where a needle is inserted to give medicines or draw blood.  Catheter. The catheter is a thin, flexible tube that extends from the reservoir. The catheter is placed into a large vein. Medicine that is inserted into the reservoir goes into the catheter and then into the vein.  How will I care for my incision site? Do not get the incision site wet. Bathe or shower as directed by your health care provider. How is my port accessed? Special steps must be taken to access the port:  Before the port is accessed, a numbing cream can be placed on the skin. This helps numb the skin over the port site.  Your health care provider uses a sterile technique to access the port. ? Your health care provider must put on a mask and sterile gloves. ? The skin over your port is cleaned carefully with an antiseptic and allowed to dry. ? The port is gently pinched between sterile gloves, and a needle is inserted into the port.  Only "non-coring" port needles should be used to access the port. Once the port is accessed, a blood return should be checked. This helps ensure that the port  is in the vein and is not clogged.  If your port needs to remain accessed for a constant infusion, a clear (transparent) bandage will be placed over the needle site. The bandage and needle will need to be changed every week, or as directed by your health care provider.  Keep the bandage covering the needle clean and dry. Do not get it wet. Follow your health care provider's instructions on how to take a shower or bath while the port is accessed.  If your port does not need to stay accessed, no bandage is needed over the port.  What is flushing? Flushing helps keep the port from getting clogged. Follow your health care provider's instructions on how and when to flush the port. Ports are usually flushed with saline solution or a medicine called heparin. The need for flushing will depend on how the port is used.  If the port is used for intermittent medicines or blood draws, the port will need to be flushed: ? After medicines have been given. ? After blood has been drawn. ? As part of routine maintenance.  If a constant infusion is running, the port may not need to be flushed.  How long will my port stay implanted? The port can stay in for as long as your health care provider thinks it is needed. When it is time for the port to come out, surgery will be   done to remove it. The procedure is similar to the one performed when the port was put in. When should I seek immediate medical care? When you have an implanted port, you should seek immediate medical care if:  You notice a bad smell coming from the incision site.  You have swelling, redness, or drainage at the incision site.  You have more swelling or pain at the port site or the surrounding area.  You have a fever that is not controlled with medicine.  This information is not intended to replace advice given to you by your health care provider. Make sure you discuss any questions you have with your health care provider. Document  Released: 04/01/2005 Document Revised: 09/07/2015 Document Reviewed: 12/07/2012 Elsevier Interactive Patient Education  2017 [employer]  

## 2017-10-03 NOTE — Progress Notes (Signed)
[facility]  [phone] [phone]  Clinic Follow up Note   Patient Care Team: Lucianne Lei, MD as PCP - General (Family Medicine) Truitt Merle, MD as Consulting Physician (Hematology) [name], MD as Consulting Physician (General Surgery) [name], MD as Attending Physician (Radiation Oncology) 10/03/2017  SUMMARY OF ONCOLOGIC HISTORY: Oncology History   Cancer Staging Malignant neoplasm of upper-inner quadrant of right breast in female, estrogen receptor positive ([place]) Staging form: Breast, AJCC 8th Edition - Clinical stage from 06/26/2017: Stage IIA (cT2, cN0, cM0, G3, ER: Positive, PR: Positive, HER2: Negative) - Signed by Truitt Merle, MD on 07/01/2017 - Pathologic stage from 07/08/2017: Stage IB (pT2, pN0, cM0, G3, ER+, PR+, HER2-, Oncotype DX score: 29) - Signed by [name], NP on 07/25/2017       Malignant neoplasm of upper-inner quadrant of right breast in female, estrogen receptor positive ([place])   06/23/2017 Mammogram    IMPRESSION: 1. There is a highly suspicious mass measuring 3.5 cm mammographically in the right breast at the palpable site identified by the patient. 2. There is 1 suspicious lymph node with cortex measuring up to 6 mm. 3.  No mammographic evidence of malignancy in the left breast.      06/26/2017 Initial Biopsy    Diagnosis 06/26/17 1. Breast, right, needle core biopsy, upper inner quadrant, 12:30 o'clock, 7cm from nipple - INVASIVE DUCTAL CARCINOMA - SEE COMMENT 2. Lymph node, needle/core biopsy, right axillary (level 2 node) - NO CARCINOMA IDENTIFIED IN ONE LYMPH NODE (0/1)      06/26/2017 Receptors her2    Prognostic indicators significant for: ER, 70% positive and PR, 70% positive, both with strong staining intensity. Proliferation marker Ki67 at 60%. HER2 negative.      07/01/2017 Initial Diagnosis    Malignant neoplasm of upper-inner quadrant of right breast in female, estrogen receptor  positive ([place])      07/08/2017 Pathology Results    Diagnosis 1. Breast, lumpectomy, Right - INVASIVE DUCTAL CARCINOMA, NOTTINGHAM GRADE 3 OF 3, 3.5 CM - MARGINS UNINVOLVED BY CARCINOMA (0.1 CM, SUPERIOR MARGIN) - PREVIOUS BIOPSY SITE CHANGES - SEE ONCOLOGY TABLE BELOW 2. Soft tissue, biopsy, Axillary - BENIGN FIBROADIPOSE TISSUE - NO MALIGNANCY IDENTIFIED 3. Lymph node, sentinel, biopsy, Right axillary - NO CARCINOMA IDENTIFIED IN ONE LYMPH NODE (0/1) 4. Lymph node, sentinel, biopsy, Right axillary - NO CARCINOMA IDENTIFIED IN ONE LYMPH NODE (0/1) - PREVIOUS BIOPSY SITE CHANGES - SEE COMMENT 5. Breast, excision, Right superior margin - BENIGN BREAST TISSUE - NO RESIDUAL CARCINOMA IDENTIFIED 6. Breast, excision, Right inferior margin - BENIGN BREAST TISSUE - NO RESIDUAL CARCINOMA IDENTIFIED      07/08/2017 Cancer Staging    Staging form: Breast, AJCC 8th Edition - Pathologic stage from 07/08/2017: Stage IB (pT2, pN0, cM0, G3, ER+, PR+, HER2-, Oncotype DX score: 29) - Signed by [name], NP on 07/25/2017      07/22/2017 Oncotype testing    Recurrence Score: 29 Distant Recurrence Risk at 9 years with Ai or Tamoxifen alone: 18% Absolute Chemotherapy Benefit: > 15 %      08/07/2017 -  Chemotherapy    Adjuvant chemo Taxotere/Cytoxanq3 weeks x4 cycles with Udenyca     CURRENT THERAPY: AdjuvantTaxotere/Cytoxanq3 weeks x4 cycles with Udenyca, started 08/08/17 -Granix added from cycle 2. Discontinued Udenyca following cycle 2.    INTERVAL HISTORY: Ms. Vine returns for hospital f/u. She completed cycle 3 TC on 09/19/17, granix on 6/11 - 6/14. She presented to ED  later on 6/14 with fever, weakness, dyspnea, and abdominal pain. She was found to be neutropenic and anemic on CBC, ANC 0.0 and Hgb 9.7. She was admitted for febrile neutropenia work up. Peripheral and PAC blood cultures were negative, urine culture with <10,000 colonies, negative C.dif and GI panel. CXR was negative.  She was given broad spectrum abx and granix x2, counts recovered. She was started on broad spectrum IV antibiotics and eventually transitioned to augmentin and doxycycline to complete 5 days outpatient antibiotics. Was discharged home on 09/28/17.   Today she reports she is recovering well, appetite and energy level are recovering. She feels she is drinking adequately. Diarrhea stopped 2-3 days ago, takes lomotil which helps. Completed augmentin and doxy today. Has remained afebrile. Denies rash, cough, chest pain, dyspnea, leg edema,    REVIEW OF SYSTEMS:   Constitutional: Denies fevers, chills or abnormal weight loss (+) mild fatigue, improving (+) appetite improving  Ears, nose, mouth, throat, and face: Denies mucositis or sore throat Respiratory: Denies cough, dyspnea or wheezes Cardiovascular: Denies palpitation, chest discomfort or lower extremity swelling Gastrointestinal:  Denies nausea, vomiting, constipation, heartburn or change in bowel habits (+) recent diarrhea, stopped >48 hours  Skin: Denies abnormal skin rashes Lymphatics: Denies new lymphadenopathy or easy bruising Neurological:Denies numbness, tingling or new weaknesses Behavioral/Psych: Mood is stable, no new changes  All other systems were reviewed with the patient and are negative.  MEDICAL HISTORY:  Past Medical History:  Diagnosis Date  . Anemia   . Anxiety   . Cancer [facility])    breast cancer right  . Depression   . GERD (gastroesophageal reflux disease)   . Headache     SURGICAL HISTORY: Past Surgical History:  Procedure Laterality Date  . CERVICAL CONE BIOPSY    . CHOLECYSTECTOMY    . MASTECTOMY WITH RADIOACTIVE SEED GUIDED EXCISION AND AXILLARY SENTINEL LYMPH NODE BIOPSY Right 07/08/2017   Procedure: RIGHT LUMPTECTOMY WITH RADIOACTIVE SEED GUIDED EXCISION AND RIGHT AXILLARY SENTINEL LYMPH NODE BIOPSY ERAS PATHWAY;  Surgeon: [name], MD;  Location: [facility];  Service: General;  Laterality: Right;  PEC  BLOCK  . OPEN REDUCTION INTERNAL FIXATION (ORIF) DISTAL RADIAL FRACTURE  03/17/2012   Procedure: OPEN REDUCTION INTERNAL FIXATION (ORIF) DISTAL RADIAL FRACTURE;  Surgeon: Roseanne Kaufman, MD;  Location: [facility];  Service: Orthopedics;  Laterality: Right;  Pre-operative a supra-clavical block right arm in addition to general anesthesia  . PORTACATH PLACEMENT N/A 07/30/2017   Procedure: INSERTION PORT-A-CATH WITH [place];  Surgeon: [name], MD;  Location: [facility];  Service: General;  Laterality: N/A;  . TUBAL LIGATION      I have reviewed the social history and family history with the patient and they are unchanged from previous note.  ALLERGIES:  has No Known Allergies.  MEDICATIONS:  Current Outpatient Medications  Medication Sig Dispense Refill  . acetaminophen-codeine (TYLENOL #3) 300-30 MG tablet Take 1 tablet by mouth every 8 (eight) hours as needed for moderate pain. 15 tablet 0  . diphenoxylate-atropine (LOMOTIL) 2.5-0.025 MG tablet Take 1-2 tablets by mouth 4 (four) times daily as needed for diarrhea or loose stools. 30 tablet 1  . DULoxetine (CYMBALTA) 30 MG capsule Take 1 capsule (30 mg total) by mouth daily. 20 capsule 0  . ibuprofen (ADVIL,MOTRIN) 200 MG tablet Take 400 mg by mouth daily as needed for mild pain or moderate pain.    [name] lidocaine-prilocaine (EMLA) cream Apply to affected area once 30 g 3  . metFORMIN (GLUCOPHAGE) 500 MG  tablet Take 1 tablet (500 mg total) by mouth 2 (two) times daily with a meal. 60 tablet 0  . ondansetron (ZOFRAN) 8 MG tablet Take 1 tablet (8 mg total) by mouth every 8 (eight) hours as needed for nausea or vomiting. 30 tablet 2  . prochlorperazine (COMPAZINE) 10 MG tablet Take 1 tablet (10 mg total) by mouth every 6 (six) hours as needed for nausea or vomiting. 30 tablet 2  . traMADol (ULTRAM) 50 MG tablet Take 1 tablet (50 mg total) by mouth every 6 (six) hours as needed. 10 tablet 0  . amoxicillin-clavulanate (AUGMENTIN) 875-125 MG tablet Take 1  tablet by mouth every 12 (twelve) hours for 10 days. 10 tablet 0  . dexamethasone (DECADRON) 4 MG tablet Take 1 tablet (4 mg total) by mouth 2 (two) times daily with a meal. Take one tablet twice a day - day before chemo and day after chemo 6 tablet 0  . doxycycline (ADOXA) 100 MG tablet Take 1 tablet (100 mg total) by mouth 2 (two) times daily. 10 tablet 0  . feeding supplement, GLUCERNA SHAKE, (GLUCERNA SHAKE) LIQD Take 237 mLs by mouth 3 (three) times daily between meals. 90 Can 3  . potassium chloride SA (K-DUR,KLOR-CON) 20 MEQ tablet Take 1 tablet (20 mEq total) by mouth 2 (two) times daily. 14 tablet 0   No current facility-administered medications for this visit.    Facility-Administered Medications Ordered in Other Visits  Medication Dose Route Frequency Provider Last Rate Last Dose  . 0.9 % NaCl with KCl 20 mEq/ L  infusion   Intravenous Once Truitt Merle, MD 500 mL/hr at 10/03/17 1230      PHYSICAL EXAMINATION: ECOG PERFORMANCE STATUS: 1 - Symptomatic but completely ambulatory  Vitals:   10/03/17 1119  BP: 132/86  Pulse: (!) 103  Resp: 18  Temp: 98.6 F (37 C)  SpO2: 99%   Filed Weights   10/03/17 1119  Weight: (!) 326 lb 3.2 oz (148 kg)    GENERAL:alert, no distress and comfortable SKIN: no rashes or significant lesions EYES: normal, Conjunctiva are pink and non-injected, sclera clear OROPHARYNX:no thrush or ulcers  LYMPH:  no palpable cervical or supraclavicular lymphadenopathy LUNGS: clear to auscultation with normal breathing effort HEART: regular rate & rhythm and no murmurs and no lower extremity edema ABDOMEN:abdomen soft, non-tender and normal bowel sounds Musculoskeletal:no cyanosis of digits and no clubbing  NEURO: alert & oriented x 3 with fluent speech, no focal motor/sensory deficits PAC without erythema  Breast incision is well healed without erythema or drainage; complete breast exam deferred    LABORATORY DATA:  I have reviewed the data as  listed CBC Latest Ref Rng & Units 10/03/2017 09/28/2017 09/27/2017  WBC 3.9 - 10.3 K/uL 6.6 7.8 1.8(L)  Hemoglobin 11.6 - 15.9 g/dL 10.8(L) 9.9(L) 10.1(L)  Hematocrit 34.8 - 46.6 % 32.9(L) 29.7(L) 30.2(L)  Platelets 145 - 400 K/uL 225 158 188     CMP Latest Ref Rng & Units 10/03/2017 09/28/2017 09/27/2017  Glucose 70 - 140 mg/dL 123 102(H) 128(H)  BUN 7 - 26 mg/dL _0 Creatinine 0.60 - 1.10 mg/dL 0.80 0.81 0.82  Sodium 136 - 145 mmol/L 140 140 138  Potassium 3.5 - 5.1 mmol/L 2.7(LL) 3.1(L) 3.0(L)  Chloride 98 - 109 mmol/L 104 107 105  CO2 22 - 29 mmol/L _1 Calcium 8.4 - 10.4 mg/dL 9.2 8.1(L) 8.1(L)  Total Protein 6.4 - 8.3 g/dL 6.9 - -  Total Bilirubin 0.2 -  1.2 mg/dL 0.3 - -  Alkaline Phos 40 - 150 U/L 76 - -  AST 5 - 34 U/L 24 - -  ALT 0 - 55 U/L 36 - -      RADIOGRAPHIC STUDIES: I have personally reviewed the radiological images as listed and agreed with the findings in the report. No results found.   ASSESSMENT & PLAN: 52 y.o. pre-menopausal woman, presented with screening discovered to Ductal carcinoma  1.  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 Sivertson appears stable. She completed 3 cycles adjuvant TC with granix on 6/7. She was hospitalized for febrile neutropenia from 6/14/- 6/16, etiology was not determined. She is recovering well. I encouraged her to continue adequate po intake for regain her strength. I encouraged her to use glucerna for nutrition support while her appetite recovers. VSS. Labs reviewed, [place] recovered. She is hypokalemic, likely secondary to recent diarrhea and decreased po intake. Will support with IVF today. She will return in 1 week for final cycle 4 TC with granix. She hopes to push chemo a few days to be able to enjoy a family function, which is fine of course.   2. Depression/anxiety -Mood is stable  3. Steroid  induced hyperglycemia -on metformin. BG normal today  4. GCSF induced bone pain -improved with granix. She uses tramadol for mild-moderate pain, tylenol #3 for severe pain. She requested refill of tramadol for her last cycle of chemo, I refilled #10 tabs, I explained to her I do not plan to refill. She understands.   5. Diarrhea -moderately controlled with lomotil. No diarrhea in >48 hours. I encouraged her to adequately hydrate  6. Hypokalemia -likely secondary to diarrhea and low po intake. K 2.7 today. Will support with IVF + 20 K and start oral K 20 mEq BID x1 week.   PLAN -Labs reviewed -IVF + 20 K today -Begin oral K 20 mEq BID x1 week, prescription sent -Return in ~1 week for final cycle 4 TC -Last tramadol refill, #10 tabs   All questions were answered. The patient knows to call the clinic with any problems, questions or concerns. No barriers to learning was detected. I spent 20 minutes counseling the patient face to face. The total time spent in the appointment was 25 minutes and more than 50% was on counseling and review of test results     [name], NP 10/03/17

## 2017-10-03 NOTE — Patient Instructions (Signed)
Dehydration, Adult Dehydration is a condition in which there is not enough fluid or water in the body. This happens when you lose more fluids than you take in. Important organs, such as the kidneys, brain, and heart, cannot function without a proper amount of fluids. Any loss of fluids from the body can lead to dehydration. Dehydration can range from mild to severe. This condition should be treated right away to prevent it from becoming severe. What are the causes? This condition may be caused by:  Vomiting.  Diarrhea.  Excessive sweating, such as from heat exposure or exercise.  Not drinking enough fluid, especially: ? When ill. ? While doing activity that requires a lot of energy.  Excessive urination.  Fever.  Infection.  Certain medicines, such as medicines that cause the body to lose excess fluid (diuretics).  Inability to access safe drinking water.  Reduced physical ability to get adequate water and food.  What increases the risk? This condition is more likely to develop in people:  Who have a poorly controlled long-term (chronic) illness, such as diabetes, heart disease, or kidney disease.  Who are age 65 or older.  Who are disabled.  Who live in a place with high altitude.  Who play endurance sports.  What are the signs or symptoms? Symptoms of mild dehydration may include:  Thirst.  Dry lips.  Slightly dry mouth.  Dry, warm skin.  Dizziness. Symptoms of moderate dehydration may include:  Very dry mouth.  Muscle cramps.  Dark urine. Urine may be the color of tea.  Decreased urine production.  Decreased tear production.  Heartbeat that is irregular or faster than normal (palpitations).  Headache.  Light-headedness, especially when you stand up from a sitting position.  Fainting (syncope). Symptoms of severe dehydration may include:  Changes in skin, such as: ? Cold and clammy skin. ? Blotchy (mottled) or pale skin. ? Skin that does  not quickly return to normal after being lightly pinched and released (poor skin turgor).  Changes in body fluids, such as: ? Extreme thirst. ? No tear production. ? Inability to sweat when body temperature is high, such as in hot weather. ? Very little urine production.  Changes in vital signs, such as: ? Weak pulse. ? Pulse that is more than 100 beats a minute when sitting still. ? Rapid breathing. ? Low blood pressure.  Other changes, such as: ? Sunken eyes. ? Cold hands and feet. ? Confusion. ? Lack of energy (lethargy). ? Difficulty waking up from sleep. ? Short-term weight loss. ? Unconsciousness. How is this diagnosed? This condition is diagnosed based on your symptoms and a physical exam. Blood and urine tests may be done to help confirm the diagnosis. How is this treated? Treatment for this condition depends on the severity. Mild or moderate dehydration can often be treated at home. Treatment should be started right away. Do not wait until dehydration becomes severe. Severe dehydration is an emergency and it needs to be treated in a hospital. Treatment for mild dehydration may include:  Drinking more fluids.  Replacing salts and minerals in your blood (electrolytes) that you may have lost. Treatment for moderate dehydration may include:  Drinking an oral rehydration solution (ORS). This is a drink that helps you replace fluids and electrolytes (rehydrate). It can be found at pharmacies and retail stores. Treatment for severe dehydration may include:  Receiving fluids through an IV tube.  Receiving an electrolyte solution through a feeding tube that is passed through your nose   and into your stomach (nasogastric tube, or NG tube).  Correcting any abnormalities in electrolytes.  Treating the underlying cause of dehydration. Follow these instructions at home:  If directed by your health care provider, drink an ORS: ? Make an ORS by following instructions on the  package. ? Start by drinking small amounts, about  cup (120 mL) every 5-10 minutes. ? Slowly increase how much you drink until you have taken the amount recommended by your health care provider.  Drink enough clear fluid to keep your urine clear or pale yellow. If you were told to drink an ORS, finish the ORS first, then start slowly drinking other clear fluids. Drink fluids such as: ? Water. Do not drink only water. Doing that can lead to having too little salt (sodium) in the body (hyponatremia). ? Ice chips. ? Fruit juice that you have added water to (diluted fruit juice). ? Low-calorie sports drinks.  Avoid: ? Alcohol. ? Drinks that contain a lot of sugar. These include high-calorie sports drinks, fruit juice that is not diluted, and soda. ? Caffeine. ? Foods that are greasy or contain a lot of fat or sugar.  Take over-the-counter and prescription medicines only as told by your health care provider.  Do not take sodium tablets. This can lead to having too much sodium in the body (hypernatremia).  Eat foods that contain a healthy balance of electrolytes, such as bananas, oranges, potatoes, tomatoes, and spinach.  Keep all follow-up visits as told by your health care provider. This is important. Contact a health care provider if:  You have abdominal pain that: ? Gets worse. ? Stays in one area (localizes).  You have a rash.  You have a stiff neck.  You are more irritable than usual.  You are sleepier or more difficult to wake up than usual.  You feel weak or dizzy.  You feel very thirsty.  You have urinated only a small amount of very dark urine over 6-8 hours. Get help right away if:  You have symptoms of severe dehydration.  You cannot drink fluids without vomiting.  Your symptoms get worse with treatment.  You have a fever.  You have a severe headache.  You have vomiting or diarrhea that: ? Gets worse. ? Does not go away.  You have blood or green matter  (bile) in your vomit.  You have blood in your stool. This may cause stool to look black and tarry.  You have not urinated in 6-8 hours.  You faint.  Your heart rate while sitting still is over 100 beats a minute.  You have trouble breathing. This information is not intended to replace advice given to you by your health care provider. Make sure you discuss any questions you have with your health care provider. Document Released: 04/01/2005 Document Revised: 10/27/2015 Document Reviewed: 05/26/2015 Elsevier Interactive Patient Education  2018 [employer]  

## 2017-10-07 ENCOUNTER — Encounter: Payer: Medicaid Other | Admitting: Nutrition

## 2017-10-07 ENCOUNTER — Telehealth: Payer: Self-pay | Admitting: Hematology

## 2017-10-07 NOTE — Telephone Encounter (Signed)
R/s appt from 6/28 to 7/1 per 6/21 los - pt aware of apt date and time.

## 2017-10-10 ENCOUNTER — Inpatient Hospital Stay: Payer: BLUE CROSS/BLUE SHIELD

## 2017-10-10 ENCOUNTER — Inpatient Hospital Stay: Payer: BLUE CROSS/BLUE SHIELD | Admitting: Hematology

## 2017-10-11 ENCOUNTER — Ambulatory Visit: Payer: Self-pay

## 2017-10-12 ENCOUNTER — Other Ambulatory Visit: Payer: Self-pay | Admitting: Nurse Practitioner

## 2017-10-12 DIAGNOSIS — R7309 Other abnormal glucose: Secondary | ICD-10-CM

## 2017-10-13 ENCOUNTER — Inpatient Hospital Stay: Payer: BLUE CROSS/BLUE SHIELD

## 2017-10-13 ENCOUNTER — Encounter: Payer: Self-pay | Admitting: Hematology

## 2017-10-13 ENCOUNTER — Telehealth: Payer: Self-pay | Admitting: Hematology

## 2017-10-13 ENCOUNTER — Inpatient Hospital Stay: Payer: BLUE CROSS/BLUE SHIELD | Attending: Hematology

## 2017-10-13 ENCOUNTER — Ambulatory Visit: Payer: BLUE CROSS/BLUE SHIELD | Admitting: Radiation Oncology

## 2017-10-13 ENCOUNTER — Inpatient Hospital Stay (HOSPITAL_BASED_OUTPATIENT_CLINIC_OR_DEPARTMENT_OTHER): Payer: BLUE CROSS/BLUE SHIELD | Admitting: Hematology

## 2017-10-13 VITALS — BP 158/91 | HR 114 | Temp 98.5°F | Resp 18 | Ht 67.0 in | Wt 326.8 lb

## 2017-10-13 VITALS — HR 98

## 2017-10-13 DIAGNOSIS — Z7952 Long term (current) use of systemic steroids: Secondary | ICD-10-CM | POA: Insufficient documentation

## 2017-10-13 DIAGNOSIS — Z5189 Encounter for other specified aftercare: Secondary | ICD-10-CM | POA: Diagnosis not present

## 2017-10-13 DIAGNOSIS — Z170 Estrogen receptor positive status [ER+]: Secondary | ICD-10-CM | POA: Diagnosis not present

## 2017-10-13 DIAGNOSIS — C50211 Malignant neoplasm of upper-inner quadrant of right female breast: Secondary | ICD-10-CM | POA: Diagnosis present

## 2017-10-13 DIAGNOSIS — E669 Obesity, unspecified: Secondary | ICD-10-CM

## 2017-10-13 DIAGNOSIS — G620 Drug-induced polyneuropathy: Secondary | ICD-10-CM | POA: Insufficient documentation

## 2017-10-13 DIAGNOSIS — Z7984 Long term (current) use of oral hypoglycemic drugs: Secondary | ICD-10-CM | POA: Insufficient documentation

## 2017-10-13 DIAGNOSIS — R739 Hyperglycemia, unspecified: Secondary | ICD-10-CM | POA: Insufficient documentation

## 2017-10-13 DIAGNOSIS — F418 Other specified anxiety disorders: Secondary | ICD-10-CM

## 2017-10-13 DIAGNOSIS — R7309 Other abnormal glucose: Secondary | ICD-10-CM

## 2017-10-13 DIAGNOSIS — N926 Irregular menstruation, unspecified: Secondary | ICD-10-CM | POA: Insufficient documentation

## 2017-10-13 DIAGNOSIS — Z5111 Encounter for antineoplastic chemotherapy: Secondary | ICD-10-CM | POA: Insufficient documentation

## 2017-10-13 DIAGNOSIS — Z95828 Presence of other vascular implants and grafts: Secondary | ICD-10-CM

## 2017-10-13 DIAGNOSIS — Z9221 Personal history of antineoplastic chemotherapy: Secondary | ICD-10-CM | POA: Insufficient documentation

## 2017-10-13 LAB — CBC WITH DIFFERENTIAL (CANCER CENTER ONLY)
Basophils Absolute: 0 10*3/uL (ref 0.0–0.1)
Basophils Relative: 0 %
EOS ABS: 0 10*3/uL (ref 0.0–0.5)
EOS PCT: 0 %
HCT: 35.5 % (ref 34.8–46.6)
Hemoglobin: 11.4 g/dL — ABNORMAL LOW (ref 11.6–15.9)
LYMPHS ABS: 1.4 10*3/uL (ref 0.9–3.3)
LYMPHS PCT: 22 %
MCH: 26.7 pg (ref 25.1–34.0)
MCHC: 32.1 g/dL (ref 31.5–36.0)
MCV: 83.1 fL (ref 79.5–101.0)
MONO ABS: 0.1 10*3/uL (ref 0.1–0.9)
Monocytes Relative: 2 %
Neutro Abs: 4.9 10*3/uL (ref 1.5–6.5)
Neutrophils Relative %: 76 %
PLATELETS: 403 10*3/uL — AB (ref 145–400)
RBC: 4.27 MIL/uL (ref 3.70–5.45)
RDW: 17.8 % — AB (ref 11.2–14.5)
WBC Count: 6.4 10*3/uL (ref 3.9–10.3)

## 2017-10-13 LAB — CMP (CANCER CENTER ONLY)
ALT: 23 U/L (ref 0–44)
AST: 18 U/L (ref 15–41)
Albumin: 3.7 g/dL (ref 3.5–5.0)
Alkaline Phosphatase: 74 U/L (ref 38–126)
Anion gap: 9 (ref 5–15)
BUN: 14 mg/dL (ref 6–20)
CHLORIDE: 106 mmol/L (ref 98–111)
CO2: 24 mmol/L (ref 22–32)
CREATININE: 0.87 mg/dL (ref 0.44–1.00)
Calcium: 9.7 mg/dL (ref 8.9–10.3)
Glucose, Bld: 226 mg/dL — ABNORMAL HIGH (ref 70–99)
POTASSIUM: 4 mmol/L (ref 3.5–5.1)
Sodium: 139 mmol/L (ref 135–145)
Total Bilirubin: 0.3 mg/dL (ref 0.3–1.2)
Total Protein: 7.6 g/dL (ref 6.5–8.1)

## 2017-10-13 MED ORDER — DEXAMETHASONE SODIUM PHOSPHATE 10 MG/ML IJ SOLN
10.0000 mg | Freq: Once | INTRAMUSCULAR | Status: AC
  Administered 2017-10-13: 10 mg via INTRAVENOUS

## 2017-10-13 MED ORDER — DEXAMETHASONE SODIUM PHOSPHATE 10 MG/ML IJ SOLN
INTRAMUSCULAR | Status: AC
  Filled 2017-10-13: qty 1

## 2017-10-13 MED ORDER — SODIUM CHLORIDE 0.9 % IV SOLN
Freq: Once | INTRAVENOUS | Status: AC
  Administered 2017-10-13: 13:00:00 via INTRAVENOUS

## 2017-10-13 MED ORDER — SODIUM CHLORIDE 0.9% FLUSH
10.0000 mL | INTRAVENOUS | Status: DC | PRN
  Administered 2017-10-13: 10 mL
  Filled 2017-10-13: qty 10

## 2017-10-13 MED ORDER — METFORMIN HCL 500 MG PO TABS: 500.0000 mg | ORAL_TABLET | Freq: Two times a day (BID) | ORAL | 0 refills | Status: DC

## 2017-10-13 MED ORDER — PALONOSETRON HCL INJECTION 0.25 MG/5ML
INTRAVENOUS | Status: AC
  Filled 2017-10-13: qty 5

## 2017-10-13 MED ORDER — PALONOSETRON HCL INJECTION 0.25 MG/5ML
0.2500 mg | Freq: Once | INTRAVENOUS | Status: AC
  Administered 2017-10-13: 0.25 mg via INTRAVENOUS

## 2017-10-13 MED ORDER — HEPARIN SOD (PORK) LOCK FLUSH 100 UNIT/ML IV SOLN
500.0000 [IU] | Freq: Once | INTRAVENOUS | Status: AC | PRN
  Administered 2017-10-13: 500 [IU]
  Filled 2017-10-13: qty 5

## 2017-10-13 MED ORDER — SODIUM CHLORIDE 0.9 % IV SOLN
600.0000 mg/m2 | Freq: Once | INTRAVENOUS | Status: AC
  Administered 2017-10-13: 1640 mg via INTRAVENOUS
  Filled 2017-10-13: qty 82

## 2017-10-13 MED ORDER — SODIUM CHLORIDE 0.9 % IV SOLN
75.0000 mg/m2 | Freq: Once | INTRAVENOUS | Status: AC
  Administered 2017-10-13: 210 mg via INTRAVENOUS
  Filled 2017-10-13: qty 21

## 2017-10-13 NOTE — Patient Instructions (Signed)
Implanted Port Home Guide An implanted port is a type of central line that is placed under the skin. Central lines are used to provide IV access when treatment or nutrition needs to be given through a person's veins. Implanted ports are used for long-term IV access. An implanted port may be placed because:  You need IV medicine that would be irritating to the small veins in your hands or arms.  You need long-term IV medicines, such as antibiotics.  You need IV nutrition for a long period.  You need frequent blood draws for lab tests.  You need dialysis.  Implanted ports are usually placed in the chest area, but they can also be placed in the upper arm, the abdomen, or the leg. An implanted port has two main parts:  Reservoir. The reservoir is round and will appear as a small, raised area under your skin. The reservoir is the part where a needle is inserted to give medicines or draw blood.  Catheter. The catheter is a thin, flexible tube that extends from the reservoir. The catheter is placed into a large vein. Medicine that is inserted into the reservoir goes into the catheter and then into the vein.  How will I care for my incision site? Do not get the incision site wet. Bathe or shower as directed by your health care provider. How is my port accessed? Special steps must be taken to access the port:  Before the port is accessed, a numbing cream can be placed on the skin. This helps numb the skin over the port site.  Your health care provider uses a sterile technique to access the port. ? Your health care provider must put on a mask and sterile gloves. ? The skin over your port is cleaned carefully with an antiseptic and allowed to dry. ? The port is gently pinched between sterile gloves, and a needle is inserted into the port.  Only "non-coring" port needles should be used to access the port. Once the port is accessed, a blood return should be checked. This helps ensure that the port  is in the vein and is not clogged.  If your port needs to remain accessed for a constant infusion, a clear (transparent) bandage will be placed over the needle site. The bandage and needle will need to be changed every week, or as directed by your health care provider.  Keep the bandage covering the needle clean and dry. Do not get it wet. Follow your health care provider's instructions on how to take a shower or bath while the port is accessed.  If your port does not need to stay accessed, no bandage is needed over the port.  What is flushing? Flushing helps keep the port from getting clogged. Follow your health care provider's instructions on how and when to flush the port. Ports are usually flushed with saline solution or a medicine called heparin. The need for flushing will depend on how the port is used.  If the port is used for intermittent medicines or blood draws, the port will need to be flushed: ? After medicines have been given. ? After blood has been drawn. ? As part of routine maintenance.  If a constant infusion is running, the port may not need to be flushed.  How long will my port stay implanted? The port can stay in for as long as your health care provider thinks it is needed. When it is time for the port to come out, surgery will be   done to remove it. The procedure is similar to the one performed when the port was put in. When should I seek immediate medical care? When you have an implanted port, you should seek immediate medical care if:  You notice a bad smell coming from the incision site.  You have swelling, redness, or drainage at the incision site.  You have more swelling or pain at the port site or the surrounding area.  You have a fever that is not controlled with medicine.  This information is not intended to replace advice given to you by your health care provider. Make sure you discuss any questions you have with your health care provider. Document  Released: 04/01/2005 Document Revised: 09/07/2015 Document Reviewed: 12/07/2012 Elsevier Interactive Patient Education  2017 [employer]  

## 2017-10-13 NOTE — Telephone Encounter (Signed)
Scheduled appt per 7/1 los- gave patient aVS and calender per los.

## 2017-10-13 NOTE — Progress Notes (Signed)
[place]  [phone] [phone]  Clinic Follow up Note   Patient Care Team: Lucianne Lei, MD as PCP - General (Family Medicine) Truitt Merle, MD as Consulting Physician (Hematology) [name], MD as Consulting Physician (General Surgery) [name], MD as Attending Physician (Radiation Oncology)   Date of Service:  10/13/2017  CHIEF COMPLAINTS:  Follow up for ductal carcinoma of the right breast   Oncology History   Cancer Staging Malignant neoplasm of upper-inner quadrant of right breast in female, estrogen receptor positive ([place]) Staging form: Breast, AJCC 8th Edition - Clinical stage from 06/26/2017: Stage IIA (cT2, cN0, cM0, G3, ER: Positive, PR: Positive, HER2: Negative) - Signed by Truitt Merle, MD on 07/01/2017 - Pathologic stage from 07/08/2017: Stage IB (pT2, pN0, cM0, G3, ER+, PR+, HER2-, Oncotype DX score: 29) - Signed by [name], NP on 07/25/2017       Malignant neoplasm of upper-inner quadrant of right breast in female, estrogen receptor positive ([place])   06/23/2017 Mammogram    IMPRESSION: 1. There is a highly suspicious mass measuring 3.5 cm mammographically in the right breast at the palpable site identified by the patient. 2. There is 1 suspicious lymph node with cortex measuring up to 6 mm. 3.  No mammographic evidence of malignancy in the left breast.      06/26/2017 Initial Biopsy    Diagnosis 06/26/17 1. Breast, right, needle core biopsy, upper inner quadrant, 12:30 o'clock, 7cm from nipple - INVASIVE DUCTAL CARCINOMA - SEE COMMENT 2. Lymph node, needle/core biopsy, right axillary (level 2 node) - NO CARCINOMA IDENTIFIED IN ONE LYMPH NODE (0/1)      06/26/2017 Receptors her2    Prognostic indicators significant for: ER, 70% positive and PR, 70% positive, both with strong staining intensity. Proliferation marker Ki67 at 60%. HER2 negative.      07/01/2017 Initial Diagnosis    Malignant neoplasm of  upper-inner quadrant of right breast in female, estrogen receptor positive ([place])      07/08/2017 Pathology Results    Diagnosis 1. Breast, lumpectomy, Right - INVASIVE DUCTAL CARCINOMA, [place]TINGHAM GRADE 3 OF 3, 3.5 CM - MARGINS UNINVOLVED BY CARCINOMA (0.1 CM, SUPERIOR MARGIN) - PREVIOUS BIOPSY SITE CHANGES - SEE ONCOLOGY TABLE BELOW 2. Soft tissue, biopsy, Axillary - BENIGN FIBROADIPOSE TISSUE - NO MALIGNANCY IDENTIFIED 3. Lymph node, sentinel, biopsy, Right axillary - NO CARCINOMA IDENTIFIED IN ONE LYMPH NODE (0/1) 4. Lymph node, sentinel, biopsy, Right axillary - NO CARCINOMA IDENTIFIED IN ONE LYMPH NODE (0/1) - PREVIOUS BIOPSY SITE CHANGES - SEE COMMENT 5. Breast, excision, Right superior margin - BENIGN BREAST TISSUE - NO RESIDUAL CARCINOMA IDENTIFIED 6. Breast, excision, Right inferior margin - BENIGN BREAST TISSUE - NO RESIDUAL CARCINOMA IDENTIFIED      07/08/2017 Cancer Staging    Staging form: Breast, AJCC 8th Edition - Pathologic stage from 07/08/2017: Stage IB (pT2, pN0, cM0, G3, ER+, PR+, HER2-, Oncotype DX score: 29) - Signed by [name], NP on 07/25/2017      07/22/2017 Oncotype testing    Recurrence Score: 29 Distant Recurrence Risk at 9 years with Ai or Tamoxifen alone: 18% Absolute Chemotherapy Benefit: > 15 %      08/07/2017 -  Chemotherapy    Adjuvant chemo Taxotere/Cytoxanq3 weeks x4 cycles with Udenyca       HISTORY OF PRESENTING ILLNESS:  Caitlin Cohen 52 y.o. female is a here because of newly diagnosed breast cancer. The patient was referred by Dr. Elly Modena. The patient  presents to the clinic today by herself.  Prior to pt's abnormal mammogram she states she felt the lump which prompted her to get screening 1 month ago. Her last mammogram was in 2008 and was otherwise normal.   Pt's mammogram from 06/23/17 revealed a highly suspicious mass measuring 3.5 cm mammographically in the right breast at the palpable site identified by the  patient.There was also a 1 suspicious lymph node with cortex measuring up to 6 Mm. Her biopsy confirmed invasive ductal carcinoma and had reassuring results of no carcinoma identified in the lymph node.   Prognostic indicators significant for: ER, 70% positive and PR, 70% positive, both with strong staining intensity. Proliferation marker Ki67 at 60%. HER2 negative.  GYN HISTORY  Menarchal: xx LMP: xx Contraceptive: not currently, oral birth control in the past  HRT:  GP: 3:3 first at 52 years old   Today the patient notes she does have some pain due to biopsy. Pt denies hot flash. She still has a period but it is irregular. Her last period was over 1 month ago.    Pt has a PMHx of Depression for which she use to take medication. She states she is not feeling overwhelmed right now just mildly anxious. She has a past surgical history of cholecystectomy, tubal ligation, right wrist fracture. She reports she previously had part of her cervix removed due to discovery of cancer cells.   Pt does not have any FHx of Cancer that she knows of. Pt denies tobacco use and she drinks alcohol occasionally.   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 All three of her children live in [place].    CURRENT THERAPY: Adjuvant Taxotere/Cytoxan q3 weeks x4 cycles with Udenyca, started 08/08/17     -Granix added from cycle 2. Discontinued Udenyca following cycle 2.    INTERVAL HISTORY   Caitlin Cohen is here for a follow up and cycle 4 TC. Since her last visit with me, she was hospitalized on 09/26/17 due to 103F fever and weakness found to be from sepsis due to neutropenic fever and discharged 09/28/17. She had a PAC placed on 10/03/17.   She presents to the clinic today accompanied by her family member. She notes she feels better since hospitalization and recovered well. She is ready to complete her TC today.     MEDICAL HISTORY:  Past Medical History:  Diagnosis Date  . Anemia   . Anxiety   .  Cancer [facility])    breast cancer right  . Depression   . GERD (gastroesophageal reflux disease)   . Headache     SURGICAL HISTORY: Past Surgical History:  Procedure Laterality Date  . CERVICAL CONE BIOPSY    . CHOLECYSTECTOMY    . MASTECTOMY WITH RADIOACTIVE SEED GUIDED EXCISION AND AXILLARY SENTINEL LYMPH NODE BIOPSY Right 07/08/2017   Procedure: RIGHT LUMPTECTOMY WITH RADIOACTIVE SEED GUIDED EXCISION AND RIGHT AXILLARY SENTINEL LYMPH NODE BIOPSY ERAS PATHWAY;  Surgeon: [name], MD;  Location: [place];  Service: General;  Laterality: Right;  PEC BLOCK  . OPEN REDUCTION INTERNAL FIXATION (ORIF) DISTAL RADIAL FRACTURE  03/17/2012   Procedure: OPEN REDUCTION INTERNAL FIXATION (ORIF) DISTAL RADIAL FRACTURE;  Surgeon: Roseanne Kaufman, MD;  Location: [place];  Service: Orthopedics;  Laterality: Right;  Pre-operative a supra-clavical block right arm in addition to general anesthesia  . PORTACATH PLACEMENT N/A 07/30/2017   Procedure: INSERTION PORT-A-CATH WITH [place];  Surgeon: [name], MD;  Location: [place];  Service: General;  Laterality: N/A;  . TUBAL LIGATION      SOCIAL HISTORY: Social History   Socioeconomic History  . Marital status: Single    Spouse name: Not on file  . Number of children: Not on file  . Years of education: Not on file  . Highest education level: Not on file  Occupational History  . Not on file  Social Needs  . Financial resource strain: Not on file  . Food insecurity:    Worry: Not on file    Inability: Not on file  . Transportation needs:    Medical: Not on file    Non-medical: Not on file  Tobacco Use  . Smoking status: Never Smoker  . Smokeless tobacco: Never Used  Substance and Sexual Activity  . Alcohol use: Yes    Comment: occassionally  . Drug use: No  . Sexual activity: Yes    Birth control/protection: Surgical  Lifestyle  . Physical activity:    Days per week: 2 days    Minutes per session: 30 min  . Stress: Only a little    Relationships  . Social connections:    Talks on phone: More than three times a week    Gets together: Once a week    Attends religious service: Never    Active member of club or organization: No    Attends meetings of clubs or organizations: Never    Relationship status: Never married  . Intimate partner violence:    Fear of current or ex partner: No    Emotionally abused: Yes    Physically abused: No    Forced sexual activity: No  Other Topics Concern  . Not on file  Social History Narrative   Lives with roommate   Works fulltime but seasonal work    FAMILY HISTORY: Family History  Problem Relation Age of Onset  . Hypertension Mother   . Diabetes Father   . Hypertension Father     ALLERGIES:  has No Known Allergies.  MEDICATIONS:  Current Outpatient Medications  Medication Sig Dispense Refill  . acetaminophen-codeine (TYLENOL #3) 300-30 MG tablet Take 1 tablet by mouth every 8 (eight) hours as needed for moderate pain. 15 tablet 0  . dexamethasone (DECADRON) 4 MG tablet Take 1 tablet (4 mg total) by mouth 2 (two) times daily with a meal. Take one tablet twice a day - day before chemo and day after chemo 6 tablet 0  . diphenoxylate-atropine (LOMOTIL) 2.5-0.025 MG tablet Take 1-2 tablets by mouth 4 (four) times daily as needed for diarrhea or loose stools. 30 tablet 1  . DULoxetine (CYMBALTA) 30 MG capsule Take 1 capsule (30 mg total) by mouth daily. 20 capsule 0  . feeding supplement, GLUCERNA SHAKE, (GLUCERNA SHAKE) LIQD Take 237 mLs by mouth 3 (three) times daily between meals. 90 Can 3  . ibuprofen (ADVIL,MOTRIN) 200 MG tablet Take 400 mg by mouth daily as needed for mild pain or moderate pain.    [name] lidocaine-prilocaine (EMLA) cream Apply to affected area once 30 g 3  . metFORMIN (GLUCOPHAGE) 500 MG tablet Take 1 tablet (500 mg total) by mouth 2 (two) times daily with a meal. 60 tablet 0  . ondansetron (ZOFRAN) 8 MG tablet Take 1 tablet (8 mg total) by mouth every 8  (eight) hours as needed for nausea or vomiting. 30 tablet 2  . prochlorperazine (COMPAZINE) 10 MG tablet Take 1 tablet (10 mg total) by mouth every 6 (six) hours as needed for nausea or  vomiting. 30 tablet 2  . traMADol (ULTRAM) 50 MG tablet Take 1 tablet (50 mg total) by mouth every 6 (six) hours as needed. 10 tablet 0   No current facility-administered medications for this visit.    Facility-Administered Medications Ordered in Other Visits  Medication Dose Route Frequency Provider Last Rate Last Dose  . sodium chloride flush (NS) 0.9 % injection 10 mL  10 mL Intracatheter PRN Truitt Merle, MD   10 mL at 10/13/17 1637    REVIEW OF SYSTEMS:   Constitutional: Denies fevers, chills or abnormal night sweats (+) hot flashes, manageable  Eyes: Denies blurriness of vision, double vision or watery eyes Ears, nose, mouth, throat, and face: Denies mucositis or sore throat Respiratory: Denies cough, dyspnea or wheezes Cardiovascular: Denies palpitation, chest discomfort or lower extremity swelling Gastrointestinal:  Denies nausea, heartburn or change in bowel habits Skin: Denies abnormal skin rashes MSK: (+) bone pain, especially in right lower leg, improved Lymphatics: Denies new lymphadenopathy or easy bruising Neurological:Denies numbness, tingling or new weaknesses Behavioral/Psych: Mood is stable, no new changes   All other systems were reviewed with the patient and are negative.  PHYSICAL EXAMINATION:  ECOG PERFORMANCE STATUS: 1 Vitals:   10/13/17 1135  BP: (!) 158/91  Pulse: (!) 114  Resp: 18  Temp: 98.5 F (36.9 C)  TempSrc: Oral  SpO2: 99%  Weight: (!) 326 lb 12.8 oz (148.2 kg)  Height: 5' 7"  (1.702 m)    GENERAL:alert, no distress and comfortable SKIN: skin color, texture, turgor are normal, no rashes or significant lesions EYES: normal, conjunctiva are pink and non-injected, sclera clear OROPHARYNX:no exudate, no erythema and lips, buccal mucosa, and tongue normal  NECK:  supple, thyroid normal size, non-tender, without nodularity LYMPH:  no palpable lymphadenopathy in the cervical, axillary or inguinal LUNGS: clear to auscultation and percussion with normal breathing effort HEART: regular rate & rhythm and no murmurs and no lower extremity edema ABDOMEN:abdomen soft, non-tender and normal bowel sounds Musculoskeletal:no cyanosis of digits and no clubbing  PSYCH: alert & oriented x 3 with fluent speech NEURO: no focal motor/sensory deficits Breast exam deferred today   LABORATORY DATA:  I have reviewed the data as listed CBC Latest Ref Rng & Units 10/13/2017 10/03/2017 09/28/2017  WBC 3.9 - 10.3 K/uL 6.4 6.6 7.8  Hemoglobin 11.6 - 15.9 g/dL 11.4(L) 10.8(L) 9.9(L)  Hematocrit 34.8 - 46.6 % 35.5 32.9(L) 29.7(L)  Platelets 145 - 400 K/uL 403(H) 225 158    CMP Latest Ref Rng & Units 10/13/2017 10/03/2017 09/28/2017  Glucose 70 - 99 mg/dL 226(H) 123 102(H)  BUN 6 - 20 mg/dL 14 9 6   Creatinine 0.44 - 1.00 mg/dL 0.87 0.80 0.81  Sodium 135 - 145 mmol/L 139 140 140  Potassium 3.5 - 5.1 mmol/L 4.0 2.7(LL) 3.1(L)  Chloride 98 - 111 mmol/L 106 104 107  CO2 22 - 32 mmol/L 24 28 26   Calcium 8.9 - 10.3 mg/dL 9.7 9.2 8.1(L)  Total Protein 6.5 - 8.1 g/dL 7.6 6.9 -  Total Bilirubin 0.3 - 1.2 mg/dL 0.3 0.3 -  Alkaline Phos 38 - 126 U/L 74 76 -  AST 15 - 41 U/L 18 24 -  ALT 0 - 44 U/L 23 36 -   PATHOLOGY  Diagnosis 06/26/17 1. Breast, right, needle core biopsy, upper inner quadrant, 12:30 o'clock, 7cm from nipple - INVASIVE DUCTAL CARCINOMA - SEE COMMENT 2. Lymph node, needle/core biopsy, right axillary (level 2 node) - NO CARCINOMA IDENTIFIED IN ONE LYMPH NODE (0/1)  Results: HER2 - NEGATIVE RATIO OF HER2/CEP17 SIGNALS 1.21 AVERAGE HER2 COPY NUMBER PER CELL 2.00 Results: IMMUNOHISTOCHEMICAL AND MORPHOMETRIC ANALYSIS PERFORMED MANUALLY Estrogen Receptor: 70%, POSITIVE, STRONG STAINING INTENSITY Progesterone Receptor: 70%, POSITIVE, STRONG STAINING  INTENSITY Proliferation Marker Ki67: 60%  RADIOGRAPHIC STUDIES: I have personally reviewed the radiological images as listed and agreed with the findings in the report.  Diagnostic Mammogram 06/23/17 IMPRESSION: 1. There is a highly suspicious mass measuring 3.5 cm mammographically in the right breast at the palpable site identified by the patient. 2. There is 1 suspicious lymph node with cortex measuring up to 6 mm. 3.  No mammographic evidence of malignancy in the left breast.   Dg Chest Port 1 View  Result Date: 09/26/2017 CLINICAL DATA:  Patient undergoing chemotherapy for breast cancer. Nausea and diarrhea since 09/22/2017. EXAM: PORTABLE CHEST 1 VIEW COMPARISON:  Single-view of the chest 07/30/2017. FINDINGS: The lungs are clear. Heart size is normal. No pneumothorax or pleural effusion. Port-A-Cath noted. IMPRESSION: Negative chest. Electronically Signed   By: Inge Rise M.D.   On: 09/26/2017 19:38    ASSESSMENT & PLAN:  52 y.o. pre-menopausal woman, presented with screening discovered to Ductal carcinoma  1.  Malignant neoplasm of the upper-ourter quadrant of right breast, base of ductal carcinoma, pT2N0M0, stage IIA, grade 3, ER+ /PR +, HER2 -, stage IB, Oncotype RS 29 -She previously underwent Right breast lumpectomy and right axillary sentinel lymph node biopsy on 07/08/17. -We previously reviewed her pathology and oncotype score in detail. She had complete resection, margins are negative. She has grade 3, ER/PR positive, HER2 negative, and node-negative disease.  -Given her age, pathologic features, and oncotype score of 29, she will benefit from adjuvant chemotherapy. I previously recommended TC q3 weeks x4 cycles. She has no other co-morbidities except obesity and has good social support, she would be a good candidate for chemotherapy.  -She started TC with Udenyca on 08/08/17.  She has been tolerating. Due to bone pain, from cycle 2, udenyca was discontinued and granix  was added. -She was hospitalized for febrile neutropenia from 6/14/- 6/16. She recovered well.  -Labs overall adequate to proceed with last cycle TC today and following granix injections for 4 days due to her recently neutropenia.   -I will refer her to Dr. Isidore Moos to start radiation soon.  -Given her ER/PR positive disease and premenopausal status I plan to start her on antiestrogen therapy with Tamoxifen after she completes radiation.  -F/u on 7/5 for toxicity checkup and IV fluids if needed  2. Depression/Anxiety  -She has a hx of depression and was previously treated with medication. Not currently on medication and she feels that her depression is controlled. Mildly anxious about her diagnosis -I previously offered social work services and she agreed -Her main concern is having to feel dependent on others, but she has good social support. I previously contacted our breast RN navigator to help assist her with transportation, she agreed -Mood stable   3. Steroids induced hyperglycemia  -This is likely steroid-induced hyperglycemia.  She is very obese, likely at high risk for diabetes. -I previously prescribed her Metformin BID, refill today. Plan to stop in 2-3 weeks since she is finishing chemo today -BG increased to 226 today (10/13/17) -I recommend she follow up with her PCP as she may be borderline Diabetic and should start management.   4. GCSF induced bone pain  -She will take Tylenol, or tramadol as needed -Patient switched to Granix on cycle 2 and notes  improvement of bone pain. She wishes to continue on this medication -I advised the patient to continue with taking ibuprofen throughout the day for pain and Tylenol #3 as needed for severe pain. Last refilled 09/19/17. I do not plan to refill in the future  -I disucssed pain should resolve soon after she completes her treatment.   5. Diarrhea  -She notes that she had 4-5 episodes of diarrhea for 4-5 days and had been taking imodium for  this without relief.  -I encouraged her to adequately hydrate. -Diarrhea currently resolved and controlled with lomotil.    6. Hypokalemia -likely secondary to diarrhea and low po intake. Previously given support with IVF + 20 K and oral K 20 mEq BID for 1 week.  -K normal at 4.0 today (10/13/17)    Plan -I will refill Metformin today  -Labs reviewed and adequate to proceed with cycle 4 TC today -granix injection on 7/3, 7/4, 7/5 and 7/6  -Lab, flush, f/u with me or [place] and IVF 2 hr on 7/5  -Rad/onc referral    Orders Placed This Encounter  Procedures  . Ambulatory referral to Radiation Oncology    Referral Priority:   Routine    Referral Type:   Consultation    Referral Reason:   Specialty Services Required    Requested Specialty:   Radiation Oncology    Number of Visits Requested:   1    All questions were answered. The patient knows to call the clinic with any problems, questions or concerns. I spent 20 minutes counseling the patient face to face. The total time spent in the appointment was 25 minutes and more than 50% was on counseling.  Oneal Deputy, am acting as scribe for Truitt Merle, MD.   I have reviewed the above documentation for accuracy and completeness, and I agree with the above.     Truitt Merle, MD 10/13/2017

## 2017-10-14 ENCOUNTER — Telehealth: Payer: Self-pay | Admitting: *Deleted

## 2017-10-14 NOTE — Telephone Encounter (Signed)
Called to congratulate pt on completing chemo. Informed she will receive a call for an appt to see Dr. Isidore Moos for xrt. Encourage pt to call with needs.

## 2017-10-15 ENCOUNTER — Inpatient Hospital Stay: Payer: BLUE CROSS/BLUE SHIELD

## 2017-10-15 VITALS — BP 150/88 | HR 100 | Temp 98.3°F | Resp 20

## 2017-10-15 DIAGNOSIS — C50211 Malignant neoplasm of upper-inner quadrant of right female breast: Secondary | ICD-10-CM | POA: Diagnosis not present

## 2017-10-15 DIAGNOSIS — Z95828 Presence of other vascular implants and grafts: Secondary | ICD-10-CM

## 2017-10-15 MED ORDER — TBO-FILGRASTIM 480 MCG/0.8ML ~~LOC~~ SOSY
480.0000 ug | PREFILLED_SYRINGE | Freq: Once | SUBCUTANEOUS | Status: AC
  Administered 2017-10-15: 480 ug via SUBCUTANEOUS

## 2017-10-15 MED ORDER — TBO-FILGRASTIM 480 MCG/0.8ML ~~LOC~~ SOSY
PREFILLED_SYRINGE | SUBCUTANEOUS | Status: AC
  Filled 2017-10-15: qty 0.8

## 2017-10-15 NOTE — Patient Instructions (Signed)
Tbo-Filgrastim injection What is this medicine? TBO-FILGRASTIM (T B O fil GRA stim) is a granulocyte colony-stimulating factor that stimulates the growth of neutrophils, a type of white blood cell important in the body's fight against infection. It is used to reduce the incidence of fever and infection in patients with certain types of cancer who are receiving chemotherapy that affects the bone marrow. This medicine may be used for other purposes; ask your health care provider or pharmacist if you have questions. COMMON BRAND NAME(S): Granix What should I tell my health care provider before I take this medicine? They need to know if you have any of these conditions: -bone scan or tests planned -kidney disease -sickle cell anemia -an unusual or allergic reaction to tbo-filgrastim, filgrastim, pegfilgrastim, other medicines, foods, dyes, or preservatives -pregnant or trying to get pregnant -breast-feeding How should I use this medicine? This medicine is for injection under the skin. If you get this medicine at home, you will be taught how to prepare and give this medicine. Refer to the Instructions for Use that come with your medication packaging. Use exactly as directed. Take your medicine at regular intervals. Do not take your medicine more often than directed. It is important that you put your used needles and syringes in a special sharps container. Do not put them in a trash can. If you do not have a sharps container, call your pharmacist or healthcare provider to get one. Talk to your pediatrician regarding the use of this medicine in children. Special care may be needed. Overdosage: If you think you have taken too much of this medicine contact a poison control center or emergency room at once. NOTE: This medicine is only for you. Do not share this medicine with others. What if I miss a dose? It is important not to miss your dose. Call your doctor or health care professional if you miss a  dose. What may interact with this medicine? This medicine may interact with the following medications: -medicines that may cause a release of neutrophils, such as lithium This list may not describe all possible interactions. Give your health care provider a list of all the medicines, herbs, non-prescription drugs, or dietary supplements you use. Also tell them if you smoke, drink alcohol, or use illegal drugs. Some items may interact with your medicine. What should I watch for while using this medicine? You may need blood work done while you are taking this medicine. What side effects may I notice from receiving this medicine? Side effects that you should report to your doctor or health care professional as soon as possible: -allergic reactions like skin rash, itching or hives, swelling of the face, lips, or tongue -blood in the urine -dark urine -dizziness -fast heartbeat -feeling faint -shortness of breath or breathing problems -signs and symptoms of infection like fever or chills; cough; or sore throat -signs and symptoms of kidney injury like trouble passing urine or change in the amount of urine -stomach or side pain, or pain at the shoulder -sweating -swelling of the legs, ankles, or abdomen -tiredness Side effects that usually do not require medical attention (report to your doctor or health care professional if they continue or are bothersome): -bone pain -headache -muscle pain -vomiting This list may not describe all possible side effects. Call your doctor for medical advice about side effects. You may report side effects to FDA at 1-800-FDA-1088. Where should I keep my medicine? Keep out of the reach of children. Store in a refrigerator between   2 and 8 degrees C (36 and 46 degrees F). Keep in carton to protect from light. Throw away this medicine if it is left out of the refrigerator for more than 5 consecutive days. Throw away any unused medicine after the expiration  date. NOTE: This sheet is a summary. It may not cover all possible information. If you have questions about this medicine, talk to your doctor, pharmacist, or health care provider.  2018 Elsevier/Gold Standard (2015-05-22 19:07:04)  

## 2017-10-16 ENCOUNTER — Inpatient Hospital Stay: Payer: BLUE CROSS/BLUE SHIELD

## 2017-10-16 VITALS — BP 127/97 | HR 105 | Temp 97.5°F | Resp 18

## 2017-10-16 DIAGNOSIS — Z95828 Presence of other vascular implants and grafts: Secondary | ICD-10-CM

## 2017-10-16 DIAGNOSIS — C50211 Malignant neoplasm of upper-inner quadrant of right female breast: Secondary | ICD-10-CM | POA: Diagnosis not present

## 2017-10-16 MED ORDER — TBO-FILGRASTIM 480 MCG/0.8ML ~~LOC~~ SOSY
480.0000 ug | PREFILLED_SYRINGE | Freq: Once | SUBCUTANEOUS | Status: AC
  Administered 2017-10-16: 480 ug via SUBCUTANEOUS

## 2017-10-16 MED ORDER — TBO-FILGRASTIM 480 MCG/0.8ML ~~LOC~~ SOSY
PREFILLED_SYRINGE | SUBCUTANEOUS | Status: AC
  Filled 2017-10-16: qty 0.8

## 2017-10-16 NOTE — Patient Instructions (Signed)
Tbo-Filgrastim injection What is this medicine? TBO-FILGRASTIM (T B O fil GRA stim) is a granulocyte colony-stimulating factor that stimulates the growth of neutrophils, a type of white blood cell important in the body's fight against infection. It is used to reduce the incidence of fever and infection in patients with certain types of cancer who are receiving chemotherapy that affects the bone marrow. This medicine may be used for other purposes; ask your health care provider or pharmacist if you have questions. COMMON BRAND NAME(S): Granix What should I tell my health care provider before I take this medicine? They need to know if you have any of these conditions: -bone scan or tests planned -kidney disease -sickle cell anemia -an unusual or allergic reaction to tbo-filgrastim, filgrastim, pegfilgrastim, other medicines, foods, dyes, or preservatives -pregnant or trying to get pregnant -breast-feeding How should I use this medicine? This medicine is for injection under the skin. If you get this medicine at home, you will be taught how to prepare and give this medicine. Refer to the Instructions for Use that come with your medication packaging. Use exactly as directed. Take your medicine at regular intervals. Do not take your medicine more often than directed. It is important that you put your used needles and syringes in a special sharps container. Do not put them in a trash can. If you do not have a sharps container, call your pharmacist or healthcare provider to get one. Talk to your pediatrician regarding the use of this medicine in children. Special care may be needed. Overdosage: If you think you have taken too much of this medicine contact a poison control center or emergency room at once. NOTE: This medicine is only for you. Do not share this medicine with others. What if I miss a dose? It is important not to miss your dose. Call your doctor or health care professional if you miss a  dose. What may interact with this medicine? This medicine may interact with the following medications: -medicines that may cause a release of neutrophils, such as lithium This list may not describe all possible interactions. Give your health care provider a list of all the medicines, herbs, non-prescription drugs, or dietary supplements you use. Also tell them if you smoke, drink alcohol, or use illegal drugs. Some items may interact with your medicine. What should I watch for while using this medicine? You may need blood work done while you are taking this medicine. What side effects may I notice from receiving this medicine? Side effects that you should report to your doctor or health care professional as soon as possible: -allergic reactions like skin rash, itching or hives, swelling of the face, lips, or tongue -blood in the urine -dark urine -dizziness -fast heartbeat -feeling faint -shortness of breath or breathing problems -signs and symptoms of infection like fever or chills; cough; or sore throat -signs and symptoms of kidney injury like trouble passing urine or change in the amount of urine -stomach or side pain, or pain at the shoulder -sweating -swelling of the legs, ankles, or abdomen -tiredness Side effects that usually do not require medical attention (report to your doctor or health care professional if they continue or are bothersome): -bone pain -headache -muscle pain -vomiting This list may not describe all possible side effects. Call your doctor for medical advice about side effects. You may report side effects to FDA at 1-800-FDA-1088. Where should I keep my medicine? Keep out of the reach of children. Store in a refrigerator between   2 and 8 degrees C (36 and 46 degrees F). Keep in carton to protect from light. Throw away this medicine if it is left out of the refrigerator for more than 5 consecutive days. Throw away any unused medicine after the expiration  date. NOTE: This sheet is a summary. It may not cover all possible information. If you have questions about this medicine, talk to your doctor, pharmacist, or health care provider.  2018 Elsevier/Gold Standard (2015-05-22 19:07:04)  

## 2017-10-17 ENCOUNTER — Telehealth: Payer: Self-pay | Admitting: Hematology

## 2017-10-17 ENCOUNTER — Inpatient Hospital Stay: Payer: BLUE CROSS/BLUE SHIELD

## 2017-10-17 ENCOUNTER — Inpatient Hospital Stay (HOSPITAL_BASED_OUTPATIENT_CLINIC_OR_DEPARTMENT_OTHER): Payer: BLUE CROSS/BLUE SHIELD | Admitting: Hematology

## 2017-10-17 VITALS — BP 108/69 | HR 123 | Temp 97.7°F | Resp 18 | Ht 67.0 in | Wt 315.8 lb

## 2017-10-17 DIAGNOSIS — C50211 Malignant neoplasm of upper-inner quadrant of right female breast: Secondary | ICD-10-CM

## 2017-10-17 DIAGNOSIS — F419 Anxiety disorder, unspecified: Secondary | ICD-10-CM | POA: Diagnosis not present

## 2017-10-17 DIAGNOSIS — E876 Hypokalemia: Secondary | ICD-10-CM | POA: Diagnosis not present

## 2017-10-17 DIAGNOSIS — Z833 Family history of diabetes mellitus: Secondary | ICD-10-CM | POA: Diagnosis not present

## 2017-10-17 DIAGNOSIS — F329 Major depressive disorder, single episode, unspecified: Secondary | ICD-10-CM | POA: Diagnosis not present

## 2017-10-17 DIAGNOSIS — K219 Gastro-esophageal reflux disease without esophagitis: Secondary | ICD-10-CM | POA: Diagnosis not present

## 2017-10-17 DIAGNOSIS — F418 Other specified anxiety disorders: Secondary | ICD-10-CM

## 2017-10-17 DIAGNOSIS — Z7984 Long term (current) use of oral hypoglycemic drugs: Secondary | ICD-10-CM | POA: Diagnosis not present

## 2017-10-17 DIAGNOSIS — R630 Anorexia: Secondary | ICD-10-CM

## 2017-10-17 DIAGNOSIS — R739 Hyperglycemia, unspecified: Secondary | ICD-10-CM

## 2017-10-17 DIAGNOSIS — C50411 Malignant neoplasm of upper-outer quadrant of right female breast: Secondary | ICD-10-CM

## 2017-10-17 DIAGNOSIS — Z79899 Other long term (current) drug therapy: Secondary | ICD-10-CM | POA: Diagnosis not present

## 2017-10-17 DIAGNOSIS — R197 Diarrhea, unspecified: Secondary | ICD-10-CM

## 2017-10-17 DIAGNOSIS — Z9049 Acquired absence of other specified parts of digestive tract: Secondary | ICD-10-CM | POA: Diagnosis not present

## 2017-10-17 DIAGNOSIS — Z170 Estrogen receptor positive status [ER+]: Principal | ICD-10-CM

## 2017-10-17 DIAGNOSIS — Z8249 Family history of ischemic heart disease and other diseases of the circulatory system: Secondary | ICD-10-CM | POA: Diagnosis not present

## 2017-10-17 DIAGNOSIS — G620 Drug-induced polyneuropathy: Secondary | ICD-10-CM

## 2017-10-17 DIAGNOSIS — M898X9 Other specified disorders of bone, unspecified site: Secondary | ICD-10-CM

## 2017-10-17 DIAGNOSIS — T451X5A Adverse effect of antineoplastic and immunosuppressive drugs, initial encounter: Secondary | ICD-10-CM

## 2017-10-17 DIAGNOSIS — Z95828 Presence of other vascular implants and grafts: Secondary | ICD-10-CM

## 2017-10-17 DIAGNOSIS — Z9011 Acquired absence of right breast and nipple: Secondary | ICD-10-CM | POA: Diagnosis not present

## 2017-10-17 DIAGNOSIS — R634 Abnormal weight loss: Secondary | ICD-10-CM

## 2017-10-17 LAB — COMPREHENSIVE METABOLIC PANEL
ALK PHOS: 84 U/L (ref 38–126)
ALT: 50 U/L — AB (ref 0–44)
AST: 32 U/L (ref 15–41)
Albumin: 3.8 g/dL (ref 3.5–5.0)
Anion gap: 10 (ref 5–15)
BUN: 13 mg/dL (ref 6–20)
CALCIUM: 9 mg/dL (ref 8.9–10.3)
CO2: 27 mmol/L (ref 22–32)
CREATININE: 0.83 mg/dL (ref 0.44–1.00)
Chloride: 101 mmol/L (ref 98–111)
GFR calc non Af Amer: >60 mL/min (ref >60)
Glucose, Bld: 117 mg/dL — ABNORMAL HIGH (ref 70–99)
Potassium: 3.2 mmol/L — ABNORMAL LOW (ref 3.5–5.1)
SODIUM: 138 mmol/L (ref 135–145)
Total Bilirubin: 0.8 mg/dL (ref 0.3–1.2)
Total Protein: 7 g/dL (ref 6.5–8.1)

## 2017-10-17 LAB — CBC WITH DIFFERENTIAL (CANCER CENTER ONLY)
BASOS PCT: 0 %
Basophils Absolute: 0 10*3/uL (ref 0.0–0.1)
EOS ABS: 0.1 10*3/uL (ref 0.0–0.5)
EOS PCT: 1 %
HCT: 34.5 % — ABNORMAL LOW (ref 34.8–46.6)
HEMOGLOBIN: 11.5 g/dL — AB (ref 11.6–15.9)
LYMPHS ABS: 1.3 10*3/uL (ref 0.9–3.3)
Lymphocytes Relative: 10 %
MCH: 27.3 pg (ref 25.1–34.0)
MCHC: 33.3 g/dL (ref 31.5–36.0)
MCV: 81.9 fL (ref 79.5–101.0)
MONO ABS: 0 10*3/uL — AB (ref 0.1–0.9)
MONOS PCT: 0 %
Neutro Abs: 11.7 10*3/uL — ABNORMAL HIGH (ref 1.5–6.5)
Neutrophils Relative %: 89 %
Platelet Count: 250 10*3/uL (ref 145–400)
RBC: 4.21 MIL/uL (ref 3.70–5.45)
RDW: 17.3 % — ABNORMAL HIGH (ref 11.2–14.5)
WBC Count: 13.2 10*3/uL — ABNORMAL HIGH (ref 3.9–10.3)

## 2017-10-17 MED ORDER — SODIUM CHLORIDE 0.9 % IV SOLN
Freq: Once | INTRAVENOUS | Status: AC
  Administered 2017-10-17: 12:00:00 via INTRAVENOUS

## 2017-10-17 MED ORDER — TBO-FILGRASTIM 480 MCG/0.8ML ~~LOC~~ SOSY
PREFILLED_SYRINGE | SUBCUTANEOUS | Status: AC
  Filled 2017-10-17: qty 0.8

## 2017-10-17 MED ORDER — HEPARIN SOD (PORK) LOCK FLUSH 100 UNIT/ML IV SOLN
500.0000 [IU] | Freq: Once | INTRAVENOUS | Status: AC
  Administered 2017-10-17: 500 [IU] via INTRAVENOUS
  Filled 2017-10-17: qty 5

## 2017-10-17 MED ORDER — SODIUM CHLORIDE 0.9% FLUSH
10.0000 mL | INTRAVENOUS | Status: DC | PRN
  Administered 2017-10-17: 10 mL
  Filled 2017-10-17: qty 10

## 2017-10-17 MED ORDER — SODIUM CHLORIDE 0.9% FLUSH
10.0000 mL | INTRAVENOUS | Status: DC | PRN
  Administered 2017-10-17: 10 mL via INTRAVENOUS
  Filled 2017-10-17: qty 10

## 2017-10-17 MED ORDER — TBO-FILGRASTIM 480 MCG/0.8ML ~~LOC~~ SOSY
480.0000 ug | PREFILLED_SYRINGE | Freq: Once | SUBCUTANEOUS | Status: AC
  Administered 2017-10-17: 480 ug via SUBCUTANEOUS

## 2017-10-17 NOTE — Progress Notes (Signed)
Caitlin Cohen  [phone] [phone]  Clinic Follow up Note   Patient Care Team: Lucianne Lei, MD as PCP - General (Family Medicine) Truitt Merle, MD as Consulting Physician (Hematology) [name], MD as Consulting Physician (General Surgery) [name], MD as Attending Physician (Radiation Oncology)   Date of Service:  10/17/2017  CHIEF COMPLAINTS:  Follow up for ductal carcinoma of the right breast   Oncology History   Cancer Staging Malignant neoplasm of upper-inner quadrant of right breast in female, estrogen receptor positive ([place]) Staging form: Breast, AJCC 8th Edition - Clinical stage from 06/26/2017: Stage IIA (cT2, cN0, cM0, G3, ER: Positive, PR: Positive, HER2: Negative) - Signed by Truitt Merle, MD on 07/01/2017 - Pathologic stage from 07/08/2017: Stage IB (pT2, pN0, cM0, G3, ER+, PR+, HER2-, Oncotype DX score: 29) - Signed by [name], NP on 07/25/2017       Malignant neoplasm of upper-inner quadrant of right breast in female, estrogen receptor positive ([place])   06/23/2017 Mammogram    IMPRESSION: 1. There is a highly suspicious mass measuring 3.5 cm mammographically in the right breast at the palpable site identified by the patient. 2. There is 1 suspicious lymph node with cortex measuring up to 6 mm. 3.  No mammographic evidence of malignancy in the left breast.      06/26/2017 Initial Biopsy    Diagnosis 06/26/17 1. Breast, right, needle core biopsy, upper inner quadrant, 12:30 o'clock, 7cm from nipple - INVASIVE DUCTAL CARCINOMA - SEE COMMENT 2. Lymph node, needle/core biopsy, right axillary (level 2 node) - NO CARCINOMA IDENTIFIED IN ONE LYMPH NODE (0/1)      06/26/2017 Receptors her2    Prognostic indicators significant for: ER, 70% positive and PR, 70% positive, both with strong staining intensity. Proliferation marker Ki67 at 60%. HER2 negative.      07/01/2017 Initial Diagnosis    Malignant neoplasm of  upper-inner quadrant of right breast in female, estrogen receptor positive ([place])      07/08/2017 Pathology Results    Diagnosis 1. Breast, lumpectomy, Right - INVASIVE DUCTAL CARCINOMA, NOTTINGHAM GRADE 3 OF 3, 3.5 CM - MARGINS UNINVOLVED BY CARCINOMA (0.1 CM, SUPERIOR MARGIN) - PREVIOUS BIOPSY SITE CHANGES - SEE ONCOLOGY TABLE BELOW 2. Soft tissue, biopsy, Axillary - BENIGN FIBROADIPOSE TISSUE - NO MALIGNANCY IDENTIFIED 3. Lymph node, sentinel, biopsy, Right axillary - NO CARCINOMA IDENTIFIED IN ONE LYMPH NODE (0/1) 4. Lymph node, sentinel, biopsy, Right axillary - NO CARCINOMA IDENTIFIED IN ONE LYMPH NODE (0/1) - PREVIOUS BIOPSY SITE CHANGES - SEE COMMENT 5. Breast, excision, Right superior margin - BENIGN BREAST TISSUE - NO RESIDUAL CARCINOMA IDENTIFIED 6. Breast, excision, Right inferior margin - BENIGN BREAST TISSUE - NO RESIDUAL CARCINOMA IDENTIFIED      07/08/2017 Cancer Staging    Staging form: Breast, AJCC 8th Edition - Pathologic stage from 07/08/2017: Stage IB (pT2, pN0, cM0, G3, ER+, PR+, HER2-, Oncotype DX score: 29) - Signed by [name], NP on 07/25/2017      07/22/2017 Oncotype testing    Recurrence Score: 29 Distant Recurrence Risk at 9 years with Ai or Tamoxifen alone: 18% Absolute Chemotherapy Benefit: > 15 %      08/07/2017 -  Chemotherapy    Adjuvant chemo Taxotere/Cytoxanq3 weeks x4 cycles with Udenyca       HISTORY OF PRESENTING ILLNESS:  Illene Cohen 52 y.o. female is a here because of newly diagnosed breast cancer. The patient was referred by Dr. Elly Modena. The patient  presents to the clinic today by herself.  Prior to pt's abnormal mammogram she states she felt the lump which prompted her to get screening 1 month ago. Her last mammogram was in 2008 and was otherwise normal.   Pt's mammogram from 06/23/17 revealed a highly suspicious mass measuring 3.5 cm mammographically in the right breast at the palpable site identified by the  patient.There was also a 1 suspicious lymph node with cortex measuring up to 6 Mm. Her biopsy confirmed invasive ductal carcinoma and had reassuring results of no carcinoma identified in the lymph node.   Prognostic indicators significant for: ER, 70% positive and PR, 70% positive, both with strong staining intensity. Proliferation marker Ki67 at 60%. HER2 negative.  GYN HISTORY  Menarchal: xx LMP: xx Contraceptive: not currently, oral birth control in the past  HRT:  GP: 3:3 first at 52 years old   Today the patient notes she does have some pain due to biopsy. Pt denies hot flash. She still has a period but it is irregular. Her last period was over 1 month ago.    Pt has a PMHx of Depression for which she use to take medication. She states she is not feeling overwhelmed right now just mildly anxious. She has a past surgical history of cholecystectomy, tubal ligation, right wrist fracture. She reports she previously had part of her cervix removed due to discovery of cancer cells.   Pt does not have any FHx of Cancer that she knows of. Pt denies tobacco use and she drinks alcohol occasionally.   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 All three of her children live in [place].    CURRENT THERAPY: - Adjuvant Taxotere/Cytoxan q3 weeks x4 cycles with Udenyca, 08/08/17 -10/13/17 -Granix added from cycle 2. Discontinued Udenyca following cycle 2.  PENDING radiation   INTERVAL HISTORY   ENDA SANTO is here for a follow up and toxicity check after her last cycle adjuvant chemo. She presents to the clinic today accompanied by her family member. She notes her last cycle was not well managed due to no appetite. She has lost 9 pounds this week. Her bone pain is not as significant with Granix injections.  She notes feeling lightheaded and increase in heart rate today. She notes mild stomach pain and regular BM with no blood. She notes numbness of her feet with balance issues without  falling.      MEDICAL HISTORY:  Past Medical History:  Diagnosis Date  . Anemia   . Anxiety   . Cancer [facility])    breast cancer right  . Depression   . GERD (gastroesophageal reflux disease)   . Headache     SURGICAL HISTORY: Past Surgical History:  Procedure Laterality Date  . CERVICAL CONE BIOPSY    . CHOLECYSTECTOMY    . MASTECTOMY WITH RADIOACTIVE SEED GUIDED EXCISION AND AXILLARY SENTINEL LYMPH NODE BIOPSY Right 07/08/2017   Procedure: RIGHT LUMPTECTOMY WITH RADIOACTIVE SEED GUIDED EXCISION AND RIGHT AXILLARY SENTINEL LYMPH NODE BIOPSY ERAS PATHWAY;  Surgeon: [name], MD;  Location: [place];  Service: General;  Laterality: Right;  PEC BLOCK  . OPEN REDUCTION INTERNAL FIXATION (ORIF) DISTAL RADIAL FRACTURE  03/17/2012   Procedure: OPEN REDUCTION INTERNAL FIXATION (ORIF) DISTAL RADIAL FRACTURE;  Surgeon: Roseanne Kaufman, MD;  Location: [place];  Service: Orthopedics;  Laterality: Right;  Pre-operative a supra-clavical block right arm in addition to general anesthesia  . PORTACATH PLACEMENT N/A 07/30/2017   Procedure: INSERTION PORT-A-CATH WITH [place];  Surgeon: [name], MD;  Location: [place];  Service: General;  Laterality: N/A;  . TUBAL LIGATION      SOCIAL HISTORY: Social History   Socioeconomic History  . Marital status: Single    Spouse name: Not on file  . Number of children: Not on file  . Years of education: Not on file  . Highest education level: Not on file  Occupational History  . Not on file  Social Needs  . Financial resource strain: Not on file  . Food insecurity:    Worry: Not on file    Inability: Not on file  . Transportation needs:    Medical: Not on file    Non-medical: Not on file  Tobacco Use  . Smoking status: Never Smoker  . Smokeless tobacco: Never Used  Substance and Sexual Activity  . Alcohol use: Yes    Comment: occassionally  . Drug use: No  . Sexual activity: Yes    Birth control/protection: Surgical  Lifestyle  .  Physical activity:    Days per week: 2 days    Minutes per session: 30 min  . Stress: Only a little  Relationships  . Social connections:    Talks on phone: More than three times a week    Gets together: Once a week    Attends religious service: Never    Active member of club or organization: No    Attends meetings of clubs or organizations: Never    Relationship status: Never married  . Intimate partner violence:    Fear of current or ex partner: No    Emotionally abused: Yes    Physically abused: No    Forced sexual activity: No  Other Topics Concern  . Not on file  Social History Narrative   Lives with roommate   Works fulltime but seasonal work    FAMILY HISTORY: Family History  Problem Relation Age of Onset  . Hypertension Mother   . Diabetes Father   . Hypertension Father     ALLERGIES:  has No Known Allergies.  MEDICATIONS:  Current Outpatient Medications  Medication Sig Dispense Refill  . acetaminophen-codeine (TYLENOL #3) 300-30 MG tablet Take 1 tablet by mouth every 8 (eight) hours as needed for moderate pain. 15 tablet 0  . dexamethasone (DECADRON) 4 MG tablet Take 1 tablet (4 mg total) by mouth 2 (two) times daily with a meal. Take one tablet twice a day - day before chemo and day after chemo 6 tablet 0  . diphenoxylate-atropine (LOMOTIL) 2.5-0.025 MG tablet Take 1-2 tablets by mouth 4 (four) times daily as needed for diarrhea or loose stools. 30 tablet 1  . DULoxetine (CYMBALTA) 30 MG capsule Take 1 capsule (30 mg total) by mouth daily. 20 capsule 0  . feeding supplement, GLUCERNA SHAKE, (GLUCERNA SHAKE) LIQD Take 237 mLs by mouth 3 (three) times daily between meals. 90 Can 3  . ibuprofen (ADVIL,MOTRIN) 200 MG tablet Take 400 mg by mouth daily as needed for mild pain or moderate pain.    [name] lidocaine-prilocaine (EMLA) cream Apply to affected area once 30 g 3  . metFORMIN (GLUCOPHAGE) 500 MG tablet Take 1 tablet (500 mg total) by mouth 2 (two) times daily with a  meal. 60 tablet 0  . ondansetron (ZOFRAN) 8 MG tablet Take 1 tablet (8 mg total) by mouth every 8 (eight) hours as needed for nausea or vomiting. 30 tablet 2  . prochlorperazine (COMPAZINE) 10 MG tablet Take 1 tablet (10 mg total)  by mouth every 6 (six) hours as needed for nausea or vomiting. 30 tablet 2  . traMADol (ULTRAM) 50 MG tablet Take 1 tablet (50 mg total) by mouth every 6 (six) hours as needed. 10 tablet 0   No current facility-administered medications for this visit.     REVIEW OF SYSTEMS:   Constitutional: Denies fevers, chills or abnormal night sweats (+) hot flashes, manageable (+) anorexia, significant weight loss Eyes: Denies blurriness of vision, double vision or watery eyes Ears, nose, mouth, throat, and face: Denies mucositis or sore throat Respiratory: Denies cough, dyspnea or wheezes Cardiovascular: Denies palpitation, chest discomfort or lower extremity swelling (+) lightheadedness (+) mild tachycardia Gastrointestinal:  Denies nausea, heartburn or change in bowel habits Skin: Denies abnormal skin rashes MSK: (+) bone pain, improved Lymphatics: Denies new lymphadenopathy or easy bruising Neurological:Denies new weaknesses (+) neuropathy in feet with mild effects on balance Behavioral/Psych: Mood is stable, no new changes   All other systems were reviewed with the patient and are negative.  PHYSICAL EXAMINATION:  ECOG PERFORMANCE STATUS: 1 Vitals:   10/17/17 1141  BP: 108/69  Pulse: (!) 123  Resp: 18  Temp: 97.7 F (36.5 C)  TempSrc: Axillary  SpO2: 98%  Weight: (!) 315 lb 12.8 oz (143.2 kg)  Height: _0  (1.702 m)    GENERAL:alert, no distress and comfortable SKIN: skin color, texture, turgor are normal, no rashes or significant lesions EYES: normal, conjunctiva are pink and non-injected, sclera clear OROPHARYNX:no exudate, no erythema and lips, buccal mucosa, and tongue normal  NECK: supple, thyroid normal size, non-tender, without nodularity LYMPH:   no palpable lymphadenopathy in the cervical, axillary or inguinal LUNGS: clear to auscultation and percussion with normal breathing effort HEART: regular rhythm and no murmurs and no lower extremity edema (+) mild tachycardia ABDOMEN:abdomen soft, non-tender and normal bowel sounds Musculoskeletal:no cyanosis of digits and no clubbing  PSYCH: alert & oriented x 3 with fluent speech NEURO: no focal motor/sensory deficits Breast exam deferred today   LABORATORY DATA:  I have reviewed the data as listed CBC Latest Ref Rng & Units 10/17/2017 10/13/2017 10/03/2017  WBC 3.9 - 10.3 K/uL 13.2(H) 6.4 6.6  Hemoglobin 11.6 - 15.9 g/dL 11.5(L) 11.4(L) 10.8(L)  Hematocrit 34.8 - 46.6 % 34.5(L) 35.5 32.9(L)  Platelets 145 - 400 K/uL 250 403(H) 225    CMP Latest Ref Rng & Units 10/13/2017 10/03/2017 09/28/2017  Glucose 70 - 99 mg/dL 226(H) 123 102(H)  BUN 6 - 20 mg/dL _1 Creatinine 0.44 - 1.00 mg/dL 0.87 0.80 0.81  Sodium 135 - 145 mmol/L 139 140 140  Potassium 3.5 - 5.1 mmol/L 4.0 2.7(LL) 3.1(L)  Chloride 98 - 111 mmol/L 106 104 107  CO2 22 - 32 mmol/L _2 Calcium 8.9 - 10.3 mg/dL 9.7 9.2 8.1(L)  Total Protein 6.5 - 8.1 g/dL 7.6 6.9 -  Total Bilirubin 0.3 - 1.2 mg/dL 0.3 0.3 -  Alkaline Phos 38 - 126 U/L 74 76 -  AST 15 - 41 U/L 18 24 -  ALT 0 - 44 U/L 23 36 -   PATHOLOGY  Diagnosis 06/26/17 1. Breast, right, needle core biopsy, upper inner quadrant, 12:30 o'clock, 7cm from nipple - INVASIVE DUCTAL CARCINOMA - SEE COMMENT 2. Lymph node, needle/core biopsy, right axillary (level 2 node) - NO CARCINOMA IDENTIFIED IN ONE LYMPH NODE (0/1) Results: HER2 - NEGATIVE RATIO OF HER2/CEP17 SIGNALS 1.21 AVERAGE HER2 COPY NUMBER PER CELL 2.00 Results: IMMUNOHISTOCHEMICAL AND MORPHOMETRIC ANALYSIS PERFORMED MANUALLY  Estrogen Receptor: 70%, POSITIVE, STRONG STAINING INTENSITY Progesterone Receptor: 70%, POSITIVE, STRONG STAINING INTENSITY Proliferation Marker Ki67: 60%  RADIOGRAPHIC  STUDIES: I have personally reviewed the radiological images as listed and agreed with the findings in the report.  Diagnostic Mammogram 06/23/17 IMPRESSION: 1. There is a highly suspicious mass measuring 3.5 cm mammographically in the right breast at the palpable site identified by the patient. 2. There is 1 suspicious lymph node with cortex measuring up to 6 mm. 3.  No mammographic evidence of malignancy in the left breast.   Dg Chest Port 1 View  Result Date: 09/26/2017 CLINICAL DATA:  Patient undergoing chemotherapy for breast cancer. Nausea and diarrhea since 09/22/2017. EXAM: PORTABLE CHEST 1 VIEW COMPARISON:  Single-view of the chest 07/30/2017. FINDINGS: The lungs are clear. Heart size is normal. No pneumothorax or pleural effusion. Port-A-Cath noted. IMPRESSION: Negative chest. Electronically Signed   By: Inge Rise M.D.   On: 09/26/2017 19:38    ASSESSMENT & PLAN:  52 y.o. pre-menopausal woman, presented with screening discovered to Ductal carcinoma  1.  Malignant neoplasm of the upper-ourter quadrant of right breast, base of ductal carcinoma, pT2N0M0, stage IIA, grade 3, ER+ /PR +, HER2 -, stage IB, Oncotype RS 29 -She previously underwent Right breast lumpectomy and right axillary sentinel lymph node biopsy on 07/08/17. -We previously reviewed her pathology and oncotype score in detail. She had complete resection, margins are negative. She has grade 3, ER/PR positive, HER2 negative, and node-negative disease.  -Given her age, pathologic features, and oncotype score of 29, she will benefit from adjuvant chemotherapy. I previously recommended TC q3 weeks x4 cycles. She has no other co-morbidities except obesity and has good social support, she would be a good candidate for chemotherapy.  -She started TC with Udenyca on 08/08/17.  She has been tolerating moderately well. Due to bone pain, from cycle 2, udenyca was discontinued and granix was added. -She was hospitalized for  febrile neutropenia from 6/14/- 6/16. She recovered well. -After 4th cycle TC she became anorexia with significant weight loss of 9 pounds in 1 week. She is also mildly dehydrated. Will proceed with IV fluid today and tomorrow.  -Plan to start radiaiton soon with Dr. Isidore Moos. Given her ER/PR positive disease and premenopausal status I plan to start her on antiestrogen therapy with Tamoxifen after she completes radiation.  -F/u in 2-3 weeks    2. Depression/Anxiety  -She has a hx of depression and was previously treated with medication. Not currently on medication and she feels that her depression is controlled. Mildly anxious about her diagnosis -I previously offered social work services and she agreed -Her main concern is having to feel dependent on others, but she has good social support. I previously contacted our breast RN navigator to help assist her with transportation, she agreed -Mood stable   3. Steroids induced hyperglycemia  -This is likely steroid-induced hyperglycemia.  She is very obese, likely at high risk for diabetes. -I previously prescribed her Metformin BID, refill today. Plan to stop in 2-3 weeks since she is finishing chemo today -I recommend she follow up with her PCP as she may be borderline Diabetic and should start diabetic management.   4. GCSF induced bone pain  -She takes Tylenol, or tramadol as needed -Patient switched to Granix on cycle 2 and notes improvement of bone pain. She wishes to continue on this medication -I previously advised the patient to continue with taking ibuprofen throughout the day for pain and Tylenol #3 as  needed for severe pain. Last refilled 09/19/17. I do not plan to refill in the future  -I discussed pain should resolve soon after she completes her treatment.   5. Diarrhea, anorexia secondary to chemo   -She notes that she had 4-5 episodes of diarrhea for 4-5 days and had been taking imodium for this without relief.  -I encouraged her to  adequately hydrate. -Diarrhea currently resolved and controlled with lomotil.  -I encourage her to take nutritional supplement such as Ensure    6. Hypokalemia  -likely secondary to diarrhea and low po intake. Previously given support with IVF + 20 K and oral K 20 mEq BID for 1 week.  -K 3.2 today, continue oral KCL    7. 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 SIsidore Mooson 7/16    No orders of the defined types were placed in this encounter.   All questions were answered. The patient knows to call the clinic with any problems, questions or concerns. I spent 15 minutes counseling the patient face to face. The total time spent in the appointment was 20 minutes and more than 50% was on counseling.  IOneal Deputy am acting as scribe for YTruitt Merle MD.   I have reviewed the above documentation for accuracy and completeness, and I agree with the above.      YTruitt Merle MD 10/17/2017

## 2017-10-17 NOTE — Telephone Encounter (Signed)
Appointments added to schedule , LMVM for patient and   Calendar/letter was mailed also

## 2017-10-17 NOTE — Progress Notes (Signed)
Location of Breast Cancer: Right Breast  Histology per Pathology Report:  06/26/17 Diagnosis 1. Breast, right, needle core biopsy, upper inner quadrant, 12:30 o'clock, 7cm from nipple - INVASIVE DUCTAL CARCINOMA - SEE COMMENT 2. Lymph node, needle/core biopsy, right axillary (level 2 node) - NO CARCINOMA IDENTIFIED IN ONE LYMPH NODE (0/1)  Receptor Status: ER(70%), PR (70%), Her2-neu (NEG), Ki-(60%)  07/08/17 Diagnosis 1. Breast, lumpectomy, Right - INVASIVE DUCTAL CARCINOMA, NOTTINGHAM GRADE 3 OF 3, 3.5 CM - MARGINS UNINVOLVED BY CARCINOMA (0.1 CM, SUPERIOR MARGIN) - PREVIOUS BIOPSY SITE CHANGES - SEE ONCOLOGY TABLE BELOW 2. Soft tissue, biopsy, Axillary - BENIGN FIBROADIPOSE TISSUE - NO MALIGNANCY IDENTIFIED 3. Lymph node, sentinel, biopsy, Right axillary - NO CARCINOMA IDENTIFIED IN ONE LYMPH NODE (0/1) 4. Lymph node, sentinel, biopsy, Right axillary - NO CARCINOMA IDENTIFIED IN ONE LYMPH NODE (0/1) - PREVIOUS BIOPSY SITE CHANGES - SEE COMMENT 5. Breast, excision, Right superior margin - BENIGN BREAST TISSUE - NO RESIDUAL CARCINOMA IDENTIFIED 6. Breast, excision, Right inferior margin - BENIGN BREAST TISSUE - NO RESIDUAL CARCINOMA IDENTIFIED  Did patient present with symptoms or was this found on screening mammography?: She self palpated a lump in her Right Breast and presented for evaluation.   Past/Anticipated interventions by surgeon, if any: 07/08/17 Procedure:Rightbreast seed guided lumpectomy Right deep axillary sentinel node biopsy Surgeon: Dr Matt Wakefield   Past/Anticipated interventions by medical oncology, if any:  10/13/17 Dr. Feng She started TC with Udenyca on 08/08/17.  She has been tolerating. Due to bone pain, from cycle 2, udenyca was discontinued and granix was added. -She was hospitalized for febrile neutropenia from 6/14/- 6/16. She recovered well.  -Labs overall adequate to proceed with last cycle TC today and following granix injections for 4  days due to her recently neutropenia.   -I will refer her to Dr. Squire to start radiation soon.  -Given her ER/PR positive disease and premenopausal status I plan to start her on antiestrogen therapy with Tamoxifen after she completes radiation.  -F/u on 7/5 for toxicity checkup and IV fluids if needed  Plan -I will refill Metformin today  -Labs reviewed and adequate to proceed with cycle 4 TC today -granix injection on 7/3, 7/4, 7/5 and 7/6  -Lab, flush, f/u with me or [place] and IVF 2 hr on 7/5  -Rad/onc referral   Lymphedema issues, if any:  She denies. She has good arm mobility.   Pain issues, if any:  She denies.   SAFETY ISSUES:  Prior radiation? No  Pacemaker/ICD? No  Possible current pregnancy? She has history of tubal ligation. Her last period was in February.   Is the patient on methotrexate? No  Current Complaints / other details:    BP 127/90   Pulse (!) 113   Temp 98.6 F (37 C)   Resp 20   Ht 5' 7" (1.702 m)   Wt (!) 319 lb 6.4 oz (144.9 kg)   SpO2 96% Comment: room air  BMI 50.03 kg/m    Wt Readings from Last 3 Encounters:  10/28/17 (!) 319 lb 6.4 oz (144.9 kg)  10/17/17 (!) 315 lb 12.8 oz (143.2 kg)  10/13/17 (!) 326 lb 12.8 oz (148.2 kg)      Malmfelt, Jennifer L, RN 10/17/2017,9:34 AM   

## 2017-10-18 ENCOUNTER — Inpatient Hospital Stay: Payer: BLUE CROSS/BLUE SHIELD

## 2017-10-18 ENCOUNTER — Encounter: Payer: Self-pay | Admitting: Hematology

## 2017-10-18 VITALS — BP 104/75 | HR 115 | Temp 97.9°F | Resp 18

## 2017-10-18 MED ORDER — SODIUM CHLORIDE 0.9 % IV SOLN: INTRAVENOUS | Status: AC

## 2017-10-18 MED ORDER — TBO-FILGRASTIM 480 MCG/0.8ML ~~LOC~~ SOSY: 480.0000 ug | PREFILLED_SYRINGE | Freq: Once | SUBCUTANEOUS | Status: DC

## 2017-10-18 NOTE — Patient Instructions (Signed)
Implanted Port Home Guide An implanted port is a type of central line that is placed under the skin. Central lines are used to provide IV access when treatment or nutrition needs to be given through a person's veins. Implanted ports are used for long-term IV access. An implanted port may be placed because:  You need IV medicine that would be irritating to the small veins in your hands or arms.  You need long-term IV medicines, such as antibiotics.  You need IV nutrition for a long period.  You need frequent blood draws for lab tests.  You need dialysis.  Implanted ports are usually placed in the chest area, but they can also be placed in the upper arm, the abdomen, or the leg. An implanted port has two main parts:  Reservoir. The reservoir is round and will appear as a small, raised area under your skin. The reservoir is the part where a needle is inserted to give medicines or draw blood.  Catheter. The catheter is a thin, flexible tube that extends from the reservoir. The catheter is placed into a large vein. Medicine that is inserted into the reservoir goes into the catheter and then into the vein.  How will I care for my incision site? Do not get the incision site wet. Bathe or shower as directed by your health care provider. How is my port accessed? Special steps must be taken to access the port:  Before the port is accessed, a numbing cream can be placed on the skin. This helps numb the skin over the port site.  Your health care provider uses a sterile technique to access the port. ? Your health care provider must put on a mask and sterile gloves. ? The skin over your port is cleaned carefully with an antiseptic and allowed to dry. ? The port is gently pinched between sterile gloves, and a needle is inserted into the port.  Only "non-coring" port needles should be used to access the port. Once the port is accessed, a blood return should be checked. This helps ensure that the port  is in the vein and is not clogged.  If your port needs to remain accessed for a constant infusion, a clear (transparent) bandage will be placed over the needle site. The bandage and needle will need to be changed every week, or as directed by your health care provider.  Keep the bandage covering the needle clean and dry. Do not get it wet. Follow your health care provider's instructions on how to take a shower or bath while the port is accessed.  If your port does not need to stay accessed, no bandage is needed over the port.  What is flushing? Flushing helps keep the port from getting clogged. Follow your health care provider's instructions on how and when to flush the port. Ports are usually flushed with saline solution or a medicine called heparin. The need for flushing will depend on how the port is used.  If the port is used for intermittent medicines or blood draws, the port will need to be flushed: ? After medicines have been given. ? After blood has been drawn. ? As part of routine maintenance.  If a constant infusion is running, the port may not need to be flushed.  How long will my port stay implanted? The port can stay in for as long as your health care provider thinks it is needed. When it is time for the port to come out, surgery will be   done to remove it. The procedure is similar to the one performed when the port was put in. When should I seek immediate medical care? When you have an implanted port, you should seek immediate medical care if:  You notice a bad smell coming from the incision site.  You have swelling, redness, or drainage at the incision site.  You have more swelling or pain at the port site or the surrounding area.  You have a fever that is not controlled with medicine.  This information is not intended to replace advice given to you by your health care provider. Make sure you discuss any questions you have with your health care provider. Document  Released: 04/01/2005 Document Revised: 09/07/2015 Document Reviewed: 12/07/2012 Elsevier Interactive Patient Education  2017 [employer]  

## 2017-10-18 NOTE — Progress Notes (Signed)
Per Melissa in pharmacy pt's [place] means that she doesn't need granix shot today.  Pt also refused fluids saying she felt fine.  A&Ox4.  Ambulatory w/steady gait.  VS stable.

## 2017-10-20 ENCOUNTER — Other Ambulatory Visit: Payer: Self-pay

## 2017-10-20 ENCOUNTER — Telehealth: Payer: Self-pay

## 2017-10-20 DIAGNOSIS — E876 Hypokalemia: Secondary | ICD-10-CM

## 2017-10-20 MED ORDER — POTASSIUM CHLORIDE CRYS ER 20 MEQ PO TBCR: 20.0000 meq | EXTENDED_RELEASE_TABLET | Freq: Two times a day (BID) | ORAL | 1 refills | Status: DC

## 2017-10-20 NOTE — Telephone Encounter (Signed)
-----   Message from Truitt Merle, MD sent at 10/20/2017  7:41 AM EDT ----- Please let pt know that her K was low at 3.2, and continue KCL. If she is taking once daily, then increase to 1 tab twice daily for 3 days then once daily for one more week, thanks.  Truitt Merle  10/20/2017

## 2017-10-20 NOTE — Telephone Encounter (Signed)
Per Dr. Burr Medico spoke with patient that her potassium was low at 3.2, she is not currently taking potassium ran out, instructed to take one twice daily for 3 days then 1 daily for 7 days.   Patient verbalized an understanding. Refill sent in to [facility].

## 2017-10-21 ENCOUNTER — Ambulatory Visit: Payer: Self-pay | Admitting: Skilled Nursing Facility1

## 2017-10-27 NOTE — Progress Notes (Signed)
Radiation Oncology         [phone]) [phone] ________________________________  outpatient followup  Name: Caitlin Cohen MRN: [id number]  Date: 10/28/2017  DOB: 1965/04/23  LE:XNTZG, Myra Rude, MD  Truitt Merle, MD   REFERRING PHYSICIAN: Truitt Merle, MD  DIAGNOSIS:    ICD-10-CM   1. 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 female who presented with breast abnormality on the following imaging: Diagnostic Bilateral Breast Mammogram with TOMO on the date of 06/23/2017.  Symptoms, if any, at that time, were: a palpable mass in her right breast.   Ultrasound of breast on 06/23/2017 revealed breast density category B, there is a highly suspicious mass measuring 3.5 cm mammographically in the right breast at the palpable site identified by the patient. There is 1 suspicious lymph node with cortex measuring up to 6 mm. No mammographic evidence of malignancy in the left breast.   Biopsy on date of 06/26/2017 showed invasive ductal carcinoma in the upper inner quadrant at 12:30 o'clock, 7 cm from the nipple. No carcinoma identified in the lymph node.  ER status: 70% positive, strong staining intensity; PR status 70% positive, strong staining intensity; Ki67: 60%; Her2 status negative, ratio signals 1.21 and average copy number per cell 2.00.   On 07/08/2017 she underwent a right breast lumpectomy showing invasive ductal carcinoma, nottingham grade 3 of 3, 3.5 cm. Axillary soft tissue biopsy shows benign fibroadipose tissue, no malignancy identified. Performed by [name], MD. SLN bx negative. Margins negative.  Caitlin Cohen is being followed by Truitt Merle, MD, who started her on TC with  Udenyca on 08/08/17. She has been tolerating. Duetobone pain, fromcycle 2,udenyca was discontinued and granixwas added. She was hospitalized for febrile neutropenia from 6/14/- 6/16, but has since recovered well. She finished chemotherapy about two-three weeks ago.   Today she is doing well. She is not planning on going on vacation anytime soon. The patient is currently not working. She hasn't had a period since February. She has a history of a tubal ligation.  She is certain she is not pregnant.  PREVIOUS RADIATION THERAPY: No  PAST MEDICAL HISTORY:  has a past medical history of Anemia, Anxiety, Cancer ([place]), Depression, GERD (gastroesophageal reflux disease), and Headache.    PAST SURGICAL HISTORY: Past Surgical History:  Procedure Laterality Date  . CERVICAL CONE BIOPSY    . CHOLECYSTECTOMY    . MASTECTOMY WITH RADIOACTIVE SEED GUIDED EXCISION AND AXILLARY SENTINEL LYMPH NODE BIOPSY Right 07/08/2017   Procedure: RIGHT LUMPTECTOMY WITH RADIOACTIVE SEED GUIDED EXCISION AND RIGHT AXILLARY SENTINEL LYMPH NODE BIOPSY ERAS PATHWAY;  Surgeon: [name], MD;  Location: [facility];  Service: General;  Laterality: Right;  PEC BLOCK  . OPEN REDUCTION INTERNAL FIXATION (ORIF) DISTAL RADIAL FRACTURE  03/17/2012   Procedure: OPEN REDUCTION INTERNAL FIXATION (ORIF) DISTAL RADIAL FRACTURE;  Surgeon: Roseanne Kaufman, MD;  Location: [facility];  Service: Orthopedics;  Laterality: Right;  Pre-operative a supra-clavical block right arm in addition to general anesthesia  . PORTACATH PLACEMENT N/A 07/30/2017   Procedure: INSERTION PORT-A-CATH WITH [place];  Surgeon: [name], MD;  Location: [facility];  Service: General;  Laterality: N/A;  . TUBAL LIGATION  FAMILY HISTORY: family history includes Diabetes in her father; Hypertension in her father and mother.  SOCIAL HISTORY:  reports that she has never smoked. She has never used smokeless tobacco. She reports that she drinks alcohol. She reports that she  does not use drugs.  ALLERGIES: Patient has no known allergies.  MEDICATIONS:  Current Outpatient Medications  Medication Sig Dispense Refill  . ibuprofen (ADVIL,MOTRIN) 200 MG tablet Take 400 mg by mouth daily as needed for mild pain or moderate pain.    [name] lidocaine-prilocaine (EMLA) cream Apply to affected area once 30 g 3  . metFORMIN (GLUCOPHAGE) 500 MG tablet Take 1 tablet (500 mg total) by mouth 2 (two) times daily with a meal. 60 tablet 0  . ondansetron (ZOFRAN) 8 MG tablet Take 1 tablet (8 mg total) by mouth every 8 (eight) hours as needed for nausea or vomiting. 30 tablet 2  . potassium chloride SA (K-DUR,KLOR-CON) 20 MEQ tablet Take 1 tablet (20 mEq total) by mouth 2 (two) times daily. Take 2 tablets daily x 3 days, then one tablet daily. 36 tablet 1  . prochlorperazine (COMPAZINE) 10 MG tablet Take 1 tablet (10 mg total) by mouth every 6 (six) hours as needed for nausea or vomiting. 30 tablet 2  . dexamethasone (DECADRON) 4 MG tablet Take 1 tablet (4 mg total) by mouth 2 (two) times daily with a meal. Take one tablet twice a day - day before chemo and day after chemo (Patient not taking: Reported on 10/28/2017) 6 tablet 0  . diphenoxylate-atropine (LOMOTIL) 2.5-0.025 MG tablet Take 1-2 tablets by mouth 4 (four) times daily as needed for diarrhea or loose stools. (Patient not taking: Reported on 10/28/2017) 30 tablet 1  . feeding supplement, GLUCERNA SHAKE, (GLUCERNA SHAKE) LIQD Take 237 mLs by mouth 3 (three) times daily between meals. (Patient not taking: Reported on 10/28/2017) 90 Can 3   No current facility-administered medications for this encounter.     REVIEW OF SYSTEMS: A 10+ POINT REVIEW OF SYSTEMS WAS OBTAINED including neurology, dermatology, psychiatry, cardiac, respiratory, lymph, extremities, GI, GU, Musculoskeletal, constitutional, breasts, reproductive, HEENT.  All pertinent positives are noted in the HPI.  All others are negative.   PHYSICAL EXAM:  height is 5' 7"  (1.702  m) and weight is 319 lb 6.4 oz (144.9 kg) (abnormal). Her temperature is 98.6 F (37 C). Her blood pressure is 127/90 and her pulse is 113 (abnormal). Her respiration is 20 and oxygen saturation is 96%.   General: Alert and oriented, in no acute distress HEENT: Head is normocephalic. Extraocular movements are intact. Oropharynx is clear. Neck: Neck is supple, no palpable cervical or supraclavicular lymphadenopathy. Heart: Regular in rate and rhythm with no murmurs, rubs, or gallops.  Chest: Clear to auscultation bilaterally, with no rhonchi, wheezes, or rales. Abdomen: Soft, nontender, nondistended, with no rigidity or guarding. Extremities: No cyanosis or edema in arms. Lymphatics: see Neck Exam Skin: No concerning lesions. Musculoskeletal: symmetric strength and muscle tone throughout. Good ROM in her shoulders. Neurologic: Cranial nerves II through XII are grossly intact. No obvious focalities. Speech is fluent. Coordination is intact. Psychiatric: Judgment and insight are intact. Affect is appropriate. Breasts: Axillary and lumpectomy scar in the right breast have healed well. No other palpable masses appreciated in the breasts or axillae.  ECOG = 1  0 - Asymptomatic (Fully active, able to carry on all predisease activities without restriction)  1 - Symptomatic but completely ambulatory (Restricted in physically strenuous activity but ambulatory and able to  carry out work of a light or sedentary nature. For example, light housework, office work)  2 - Symptomatic, <50% in bed during the day (Ambulatory and capable of all self care but unable to carry out any work activities. Up and about more than 50% of waking hours)  3 - Symptomatic, >50% in bed, but not bedbound (Capable of only limited self-care, confined to bed or chair 50% or more of waking hours)  4 - Bedbound (Completely disabled. Cannot carry on any self-care. Totally confined to bed or chair)  5 - Death   Eustace Pen MM, [place]  RH, Tormey DC, et al. [phone]). "Toxicity and response criteria of the [facility]". [place] Oncol. 5 (6): 649-55   LABORATORY DATA:  Lab Results  Component Value Date   WBC 13.2 (H) 10/17/2017   HGB 11.5 (L) 10/17/2017   HCT 34.5 (L) 10/17/2017   MCV 81.9 10/17/2017   PLT 250 10/17/2017   CMP     Component Value Date/Time   NA 138 10/17/2017 1105   K 3.2 (L) 10/17/2017 1105   CL 101 10/17/2017 1105   CO2 27 10/17/2017 1105   GLUCOSE 117 (H) 10/17/2017 1105   BUN 13 10/17/2017 1105   CREATININE 0.83 10/17/2017 1105   CREATININE 0.87 10/13/2017 1043   CALCIUM 9.0 10/17/2017 1105   PROT 7.0 10/17/2017 1105   ALBUMIN 3.8 10/17/2017 1105   AST 32 10/17/2017 1105   AST 18 10/13/2017 1043   ALT 50 (H) 10/17/2017 1105   ALT 23 10/13/2017 1043   ALKPHOS 84 10/17/2017 1105   BILITOT 0.8 10/17/2017 1105   BILITOT 0.3 10/13/2017 1043   GFRNONAA >60 10/17/2017 1105   GFRNONAA >60 10/13/2017 1043   GFRAA >60 10/17/2017 1105   GFRAA >60 10/13/2017 1043         RADIOGRAPHY: No results found.    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 We discussed the risks, benefits, and side effects of radiotherapy. I recommend radiotherapy to the right breast to reduce her risk of locoregional recurrence by 2/3.  We discussed that radiation would take approximately 4 weeks to complete and that I would give the patient a few weeks to heal following surgery before starting treatment planning. We spoke about acute effects including skin irritation and fatigue as well as much less common late effects including internal organ injury or irritation. We spoke about the latest technology that is used to minimize the risk of late effects for patients undergoing radiotherapy to the breast or chest wall. No guarantees of treatment were given. The patient is enthusiastic about  proceeding with treatment. I look forward to participating in the patient's care.    The patient had a tubal ligation decades ago and had her last menstrual cycle in February 2019 before chemotherapy. Pregnancy test was negative 3 months ago. She declines a pregnancy test today after discussing the potential  risks of RT to a fetus. She is sure she is not pregnant.  We will plan her radiotherapy next week.  Consent signed today.  I spent 25 minutes  face to face with the patient and more than 50% of that time was spent in counseling and/or coordination of care.    __________________________________________   [name], MD   This document serves as a record of services personally performed by [name], MD. It was  created on her behalf by Margit Banda, a trained medical scribe. The creation of this record is based on the scribe's personal observations and the provider's statements to them. This document has been checked and approved by the attending provider.

## 2017-10-28 ENCOUNTER — Ambulatory Visit
Admission: RE | Admit: 2017-10-28 | Discharge: 2017-10-28 | Disposition: A | Discharge status: Home / Self Care | Payer: BLUE CROSS/BLUE SHIELD | Source: Ambulatory Visit | Attending: Radiation Oncology | Admitting: Radiation Oncology

## 2017-10-28 ENCOUNTER — Other Ambulatory Visit: Payer: Self-pay

## 2017-10-28 ENCOUNTER — Encounter: Payer: Self-pay | Admitting: Radiation Oncology

## 2017-10-28 VITALS — BP 127/90 | HR 113 | Temp 98.6°F | Resp 20 | Ht 67.0 in | Wt 319.4 lb

## 2017-10-28 DIAGNOSIS — Z8249 Family history of ischemic heart disease and other diseases of the circulatory system: Secondary | ICD-10-CM | POA: Insufficient documentation

## 2017-10-28 DIAGNOSIS — F419 Anxiety disorder, unspecified: Secondary | ICD-10-CM | POA: Insufficient documentation

## 2017-10-28 DIAGNOSIS — C50211 Malignant neoplasm of upper-inner quadrant of right female breast: Secondary | ICD-10-CM

## 2017-10-28 DIAGNOSIS — Z9049 Acquired absence of other specified parts of digestive tract: Secondary | ICD-10-CM | POA: Insufficient documentation

## 2017-10-28 DIAGNOSIS — Z9011 Acquired absence of right breast and nipple: Secondary | ICD-10-CM | POA: Insufficient documentation

## 2017-10-28 DIAGNOSIS — F329 Major depressive disorder, single episode, unspecified: Secondary | ICD-10-CM | POA: Insufficient documentation

## 2017-10-28 DIAGNOSIS — Z170 Estrogen receptor positive status [ER+]: Principal | ICD-10-CM

## 2017-10-28 DIAGNOSIS — K219 Gastro-esophageal reflux disease without esophagitis: Secondary | ICD-10-CM | POA: Insufficient documentation

## 2017-10-28 DIAGNOSIS — Z79899 Other long term (current) drug therapy: Secondary | ICD-10-CM | POA: Insufficient documentation

## 2017-10-28 DIAGNOSIS — Z833 Family history of diabetes mellitus: Secondary | ICD-10-CM | POA: Insufficient documentation

## 2017-10-28 DIAGNOSIS — Z7984 Long term (current) use of oral hypoglycemic drugs: Secondary | ICD-10-CM | POA: Insufficient documentation

## 2017-10-29 ENCOUNTER — Telehealth: Payer: Self-pay

## 2017-10-29 ENCOUNTER — Encounter: Payer: Self-pay | Admitting: *Deleted

## 2017-10-29 NOTE — Progress Notes (Signed)
[place] Psychosocial Distress Screening Clinical Social Work  Clinical Social Work was referred by distress screening protocol.  The patient scored a 7 on the Psychosocial Distress Thermometer which indicates moderate distress. Clinical Social Worker contacted patient by phone to assess for distress and other psychosocial needs.  Patient stated she was feeling overwhelmed starting another phase of treatment, but felt "better" after meeting with the treatment team and getting additional information.  CSW and patient discussed transportation resources at [facility].  Patient stated she did not have transportation concerns at this time but know to call CSW if she needs assistance.  CSW and patient reviewed support services at [facility] and [place] encouraged patient to call with needs or concerns.     ONCBCN DISTRESS SCREENING 10/28/2017  Screening Type Initial Screening  Distress experienced in past week (1-10) 7  Practical problem type Housing;Work/school  Family Problem type   Emotional problem type Depression;Adjusting to illness  Information Concerns Type Lack of info about treatment  Physical Problem type   Referral to support programs      Johnnye Lana, MSW, LCSW, OSW-C Clinical Social Worker [facility] [phone]

## 2017-10-29 NOTE — Telephone Encounter (Signed)
Signed FMLA papers dropped off in [place] office, LVM for Ivin Poot letting her know.

## 2017-10-30 NOTE — Progress Notes (Unsigned)
FMLA successfully faxed to [facility] at [phone]. Mailed copy to patient address on file.

## 2017-10-31 ENCOUNTER — Telehealth: Payer: Self-pay

## 2017-10-31 ENCOUNTER — Encounter: Payer: Self-pay | Admitting: Hematology

## 2017-10-31 NOTE — Telephone Encounter (Signed)
LVM for Ivin Poot LPN signed FMLA papers and letter for patient are being left in front office.

## 2017-11-03 NOTE — Progress Notes (Signed)
FMLA paperwork and doctors note successfully faxed to [facility] at [phone].

## 2017-11-03 NOTE — Progress Notes (Signed)
FMLA complete and letter written by Dr. Burr Medico taken to Ms. Wilma at front desk for patient to pick up. Pt aware and will come to pick them up when she can.

## 2017-11-05 ENCOUNTER — Telehealth: Payer: Self-pay

## 2017-11-05 ENCOUNTER — Ambulatory Visit
Admission: RE | Admit: 2017-11-05 | Discharge: 2017-11-05 | Disposition: A | Discharge status: Home / Self Care | Payer: BLUE CROSS/BLUE SHIELD | Source: Ambulatory Visit | Attending: Radiation Oncology | Admitting: Radiation Oncology

## 2017-11-05 DIAGNOSIS — C50211 Malignant neoplasm of upper-inner quadrant of right female breast: Secondary | ICD-10-CM | POA: Insufficient documentation

## 2017-11-05 DIAGNOSIS — Z170 Estrogen receptor positive status [ER+]: Secondary | ICD-10-CM | POA: Diagnosis not present

## 2017-11-05 DIAGNOSIS — Z510 Encounter for antineoplastic radiation therapy: Secondary | ICD-10-CM | POA: Insufficient documentation

## 2017-11-05 NOTE — Telephone Encounter (Signed)
Faxed medical clearance to [place]:  Rinaldo Cloud NP.  Fax [phone]

## 2017-11-05 NOTE — Progress Notes (Signed)
  Radiation Oncology         [phone] ________________________________  Name: Caitlin Cohen MRN: [id number]  Date: 11/05/2017  DOB: [birth year]  SIMULATION AND TREATMENT PLANNING NOTE    Outpatient  DIAGNOSIS:     ICD-10-CM   1. Malignant neoplasm of upper-inner quadrant of right breast in female, estrogen receptor positive ([place]) C50.211    Z17.0     NARRATIVE:  The patient was brought to the [facility].  Identity was confirmed.  All relevant records and images related to the planned course of therapy were reviewed.  The patient freely provided informed written consent to proceed with treatment after reviewing the details related to the planned course of therapy. The consent form was witnessed and verified by the simulation staff.    Then, the patient was set-up in a stable reproducible supine position for radiation therapy with her ipsilateral arm over her head, and her upper body secured in a custom-made Vac-lok device.  CT images were obtained.  Surface markings were placed.  The CT images were loaded into the planning software.    TREATMENT PLANNING NOTE: Treatment planning then occurred.  The radiation prescription was entered and confirmed.     A total of 3 medically necessary complex treatment devices were fabricated and supervised by me: 2 fields with MLCs for custom blocks to protect heart, and lungs;  and, a Vac-lok. MORE COMPLEX DEVICES MAY BE MADE IN DOSIMETRY FOR FIELD IN FIELD BEAMS FOR DOSE HOMOGENEITY.  I have requested : 3D Simulation which is medically necessary to give adequate dose to at risk tissues while sparing lungs and heart.  I have requested a DVH of the following structures: lungs, heart, right lumpectomy cavity.    The patient will receive 40.05 Gy in 15 fractions to the right breast with 2 tangential fields.   This will be followed by a boost.  Optical Surface Tracking Plan:  Since intensity modulated radiotherapy (IMRT) and 3D conformal  radiation treatment methods are predicated on accurate and precise positioning for treatment, intrafraction motion monitoring is medically necessary to ensure accurate and safe treatment delivery. The ability to quantify intrafraction motion without excessive ionizing radiation dose can only be performed with optical surface tracking. Accordingly, surface imaging offers the opportunity to obtain 3D measurements of patient position throughout IMRT and 3D treatments without excessive radiation exposure. I am ordering optical surface tracking for this patient's upcoming course of radiotherapy.  ________________________________   Reference:  Ursula Alert, J, et al. Surface imaging-based analysis of intrafraction motion for breast radiotherapy patients.Journal of [place], n. 6, nov. 2014. ISSN [id number].  Available at: <http://www.jacmp.org/index.php/jacmp/article/view/4957>.    -----------------------------------  [name], MD

## 2017-11-07 ENCOUNTER — Encounter: Payer: Self-pay | Admitting: Nurse Practitioner

## 2017-11-07 ENCOUNTER — Inpatient Hospital Stay: Payer: BLUE CROSS/BLUE SHIELD

## 2017-11-07 ENCOUNTER — Inpatient Hospital Stay (HOSPITAL_BASED_OUTPATIENT_CLINIC_OR_DEPARTMENT_OTHER): Payer: BLUE CROSS/BLUE SHIELD | Admitting: Nurse Practitioner

## 2017-11-07 ENCOUNTER — Telehealth: Payer: Self-pay | Admitting: Hematology

## 2017-11-07 VITALS — BP 140/89 | HR 109 | Temp 98.4°F | Resp 18 | Ht 67.0 in | Wt 323.5 lb

## 2017-11-07 DIAGNOSIS — C50211 Malignant neoplasm of upper-inner quadrant of right female breast: Secondary | ICD-10-CM | POA: Diagnosis not present

## 2017-11-07 DIAGNOSIS — R739 Hyperglycemia, unspecified: Secondary | ICD-10-CM

## 2017-11-07 DIAGNOSIS — Z170 Estrogen receptor positive status [ER+]: Secondary | ICD-10-CM

## 2017-11-07 DIAGNOSIS — Z7952 Long term (current) use of systemic steroids: Secondary | ICD-10-CM

## 2017-11-07 DIAGNOSIS — F418 Other specified anxiety disorders: Secondary | ICD-10-CM

## 2017-11-07 DIAGNOSIS — Z95828 Presence of other vascular implants and grafts: Secondary | ICD-10-CM

## 2017-11-07 DIAGNOSIS — Z7984 Long term (current) use of oral hypoglycemic drugs: Secondary | ICD-10-CM

## 2017-11-07 DIAGNOSIS — T451X5A Adverse effect of antineoplastic and immunosuppressive drugs, initial encounter: Secondary | ICD-10-CM

## 2017-11-07 DIAGNOSIS — G620 Drug-induced polyneuropathy: Secondary | ICD-10-CM

## 2017-11-07 DIAGNOSIS — Z9221 Personal history of antineoplastic chemotherapy: Secondary | ICD-10-CM

## 2017-11-07 LAB — CMP (CANCER CENTER ONLY)
ALK PHOS: 64 U/L (ref 38–126)
ALT: 27 U/L (ref 0–44)
ANION GAP: 8 (ref 5–15)
AST: 26 U/L (ref 15–41)
Albumin: 3.6 g/dL (ref 3.5–5.0)
BILIRUBIN TOTAL: 0.4 mg/dL (ref 0.3–1.2)
BUN: 14 mg/dL (ref 6–20)
CALCIUM: 9.3 mg/dL (ref 8.9–10.3)
CO2: 24 mmol/L (ref 22–32)
CREATININE: 0.83 mg/dL (ref 0.44–1.00)
Chloride: 108 mmol/L (ref 98–111)
GFR, Estimated: >60 mL/min (ref >60)
GLUCOSE: 115 mg/dL — AB (ref 70–99)
Potassium: 3.5 mmol/L (ref 3.5–5.1)
Sodium: 140 mmol/L (ref 135–145)
TOTAL PROTEIN: 6.9 g/dL (ref 6.5–8.1)

## 2017-11-07 LAB — CBC WITH DIFFERENTIAL (CANCER CENTER ONLY)
BASOS ABS: 0.1 10*3/uL (ref 0.0–0.1)
BASOS PCT: 1 %
EOS PCT: 1 %
Eosinophils Absolute: 0.1 10*3/uL (ref 0.0–0.5)
HCT: 33.2 % — ABNORMAL LOW (ref 34.8–46.6)
Hemoglobin: 11.1 g/dL — ABNORMAL LOW (ref 11.6–15.9)
Lymphocytes Relative: 25 %
Lymphs Abs: 1.9 10*3/uL (ref 0.9–3.3)
MCH: 27.4 pg (ref 25.1–34.0)
MCHC: 33.3 g/dL (ref 31.5–36.0)
MCV: 82.2 fL (ref 79.5–101.0)
MONO ABS: 0.4 10*3/uL (ref 0.1–0.9)
MONOS PCT: 6 %
NEUTROS ABS: 5 10*3/uL (ref 1.5–6.5)
Neutrophils Relative %: 67 %
PLATELETS: 345 10*3/uL (ref 145–400)
RBC: 4.04 MIL/uL (ref 3.70–5.45)
RDW: 19.1 % — AB (ref 11.2–14.5)
WBC Count: 7.5 10*3/uL (ref 3.9–10.3)

## 2017-11-07 MED ORDER — SODIUM CHLORIDE 0.9% FLUSH
10.0000 mL | INTRAVENOUS | Status: DC | PRN
  Administered 2017-11-07: 10 mL
  Filled 2017-11-07: qty 10

## 2017-11-07 NOTE — Progress Notes (Signed)
[place]  [phone] [phone]  Clinic Follow up Note   Patient Care Team: Lucianne Lei, MD as PCP - General (Family Medicine) Truitt Merle, MD as Consulting Physician (Hematology) [name], MD as Consulting Physician (General Surgery) [name], MD as Attending Physician (Radiation Oncology) 11/07/2017  SUMMARY OF ONCOLOGIC HISTORY: Oncology History   Cancer Staging Malignant neoplasm of upper-inner quadrant of right breast in female, estrogen receptor positive ([place]) Staging form: Breast, AJCC 8th Edition - Clinical stage from 06/26/2017: Stage IIA (cT2, cN0, cM0, G3, ER: Positive, PR: Positive, HER2: Negative) - Signed by Truitt Merle, MD on 07/01/2017 - Pathologic stage from 07/08/2017: Stage IB (pT2, pN0, cM0, G3, ER+, PR+, HER2-, Oncotype DX score: 29) - Signed by Caitlin Feeling, NP on 07/25/2017       Malignant neoplasm of upper-inner quadrant of right breast in female, estrogen receptor positive ([place])   06/23/2017 Mammogram    IMPRESSION: 1. There is a highly suspicious mass measuring 3.5 cm mammographically in the right breast at the palpable site identified by the patient. 2. There is 1 suspicious lymph node with cortex measuring up to 6 mm. 3.  No mammographic evidence of malignancy in the left breast.      06/26/2017 Initial Biopsy    Diagnosis 06/26/17 1. Breast, right, needle core biopsy, upper inner quadrant, 12:30 o'clock, 7cm from nipple - INVASIVE DUCTAL CARCINOMA - SEE COMMENT 2. Lymph node, needle/core biopsy, right axillary (level 2 node) - NO CARCINOMA IDENTIFIED IN ONE LYMPH NODE (0/1)      06/26/2017 Receptors her2    Prognostic indicators significant for: ER, 70% positive and PR, 70% positive, both with strong staining intensity. Proliferation marker Ki67 at 60%. HER2 negative.      07/01/2017 Initial Diagnosis    Malignant neoplasm of upper-inner quadrant of right breast in female, estrogen receptor  positive ([place])      07/08/2017 Pathology Results    Diagnosis 1. Breast, lumpectomy, Right - INVASIVE DUCTAL CARCINOMA, [place]HAM GRADE 3 OF 3, 3.5 CM - MARGINS UNINVOLVED BY CARCINOMA (0.1 CM, SUPERIOR MARGIN) - PREVIOUS BIOPSY SITE CHANGES - SEE ONCOLOGY TABLE BELOW 2. Soft tissue, biopsy, Axillary - BENIGN FIBROADIPOSE TISSUE - NO MALIGNANCY IDENTIFIED 3. Lymph node, sentinel, biopsy, Right axillary - NO CARCINOMA IDENTIFIED IN ONE LYMPH NODE (0/1) 4. Lymph node, sentinel, biopsy, Right axillary - NO CARCINOMA IDENTIFIED IN ONE LYMPH NODE (0/1) - PREVIOUS BIOPSY SITE CHANGES - SEE COMMENT 5. Breast, excision, Right superior margin - BENIGN BREAST TISSUE - NO RESIDUAL CARCINOMA IDENTIFIED 6. Breast, excision, Right inferior margin - BENIGN BREAST TISSUE - NO RESIDUAL CARCINOMA IDENTIFIED      07/08/2017 Cancer Staging    Staging form: Breast, AJCC 8th Edition - Pathologic stage from 07/08/2017: Stage IB (pT2, pN0, cM0, G3, ER+, PR+, HER2-, Oncotype DX score: 29) - Signed by Caitlin Feeling, NP on 07/25/2017      07/22/2017 Oncotype testing    Recurrence Score: 29 Distant Recurrence Risk at 9 years with Ai or Tamoxifen alone: 18% Absolute Chemotherapy Benefit: > 15 %      08/07/2017 - 10/13/2017 Chemotherapy    Adjuvant chemo Taxotere/Cytoxanq3 weeks x4 cycles with Udenyca -Granix added from cycle 2. Discontinued Udenyca following cycle 2.      PRIOR THERAPY: - AdjuvantTaxotere/Cytoxanq3 weeks x4 cycles with Udenyca, 08/08/17-10/13/17 -Granix added from cycle 2. Discontinued Udenyca following cycle 2.   CURRENT THERAPY: PENDING adjuvant radiation to start 7/29 per Dr. Isidore Moos  INTERVAL HISTORY: Caitlin Cohen returns for follow up as scheduled. She continues to recover from last cycle of chemo on 10/13/17. Appetite and energy level are improving. She has gained some weight back. She is drinking adequately, no recurrent lightheadedness. She does note mild numbness to fingertips  and toes. She takes B complex vitamin. She denies functional limitations or difficulty ambulating. Diarrhea is improving, she continues to have an episode every few days. Imodium helps. Denies recent fever, chills, cough, chest pain, dyspnea, or pain. She is scheduled to begin radiation per Dr. Isidore Moos on Monday.   REVIEW OF SYSTEMS:   Constitutional: Denies fevers, chills or abnormal weight loss (+) improving energy (+) improving appetite Ears, nose, mouth, throat, and face: Denies mucositis or sore throat Respiratory: Denies cough, dyspnea or wheezes Cardiovascular: Denies palpitation, chest discomfort or lower extremity swelling Gastrointestinal:  Denies nausea, vomiting, constipation, heartburn or change in bowel habits (+) occasional diarrhea, episode every few days improves with Imodium Skin: Denies abnormal skin rashes Lymphatics: Denies new lymphadenopathy or easy bruising Neurological:Denies lightheadedness or new weaknesses (+) constant numbness to fingers and toes without functional deficit Behavioral/Psych: Mood is stable, no new changes  All other systems were reviewed with the patient and are negative.  MEDICAL HISTORY:  Past Medical History:  Diagnosis Date  . Anemia   . Anxiety   . Cancer [facility])    breast cancer right  . Depression   . GERD (gastroesophageal reflux disease)   . Headache     SURGICAL HISTORY: Past Surgical History:  Procedure Laterality Date  . CERVICAL CONE BIOPSY    . CHOLECYSTECTOMY    . MASTECTOMY WITH RADIOACTIVE SEED GUIDED EXCISION AND AXILLARY SENTINEL LYMPH NODE BIOPSY Right 07/08/2017   Procedure: RIGHT LUMPTECTOMY WITH RADIOACTIVE SEED GUIDED EXCISION AND RIGHT AXILLARY SENTINEL LYMPH NODE BIOPSY ERAS PATHWAY;  Surgeon: [name], MD;  Location: [place];  Service: General;  Laterality: Right;  PEC BLOCK  . OPEN REDUCTION INTERNAL FIXATION (ORIF) DISTAL RADIAL FRACTURE  03/17/2012   Procedure: OPEN REDUCTION INTERNAL FIXATION (ORIF)  DISTAL RADIAL FRACTURE;  Surgeon: Roseanne Kaufman, MD;  Location: [place];  Service: Orthopedics;  Laterality: Right;  Pre-operative a supra-clavical block right arm in addition to general anesthesia  . PORTACATH PLACEMENT N/A 07/30/2017   Procedure: INSERTION PORT-A-CATH WITH [place];  Surgeon: [name], MD;  Location: [place];  Service: General;  Laterality: N/A;  . TUBAL LIGATION      I have reviewed the social history and family history with the patient and they are unchanged from previous note.  ALLERGIES:  has No Known Allergies.  MEDICATIONS:  Current Outpatient Medications  Medication Sig Dispense Refill  . metFORMIN (GLUCOPHAGE) 500 MG tablet Take 1 tablet (500 mg total) by mouth 2 (two) times daily with a meal. 60 tablet 0  . diphenoxylate-atropine (LOMOTIL) 2.5-0.025 MG tablet Take 1-2 tablets by mouth 4 (four) times daily as needed for diarrhea or loose stools. (Patient not taking: Reported on 10/28/2017) 30 tablet 1  . ibuprofen (ADVIL,MOTRIN) 200 MG tablet Take 400 mg by mouth daily as needed for mild pain or moderate pain.     No current facility-administered medications for this visit.     PHYSICAL EXAMINATION: ECOG PERFORMANCE STATUS: 1 - Symptomatic but completely ambulatory  Vitals:   11/07/17 1133  BP: 140/89  Pulse: (!) 109  Resp: 18  Temp: 98.4 F (36.9 C)  SpO2: 100%   Filed Weights   11/07/17 1133  Weight: (!) 323 lb 8  oz (146.7 kg)    GENERAL:alert, no distress and comfortable SKIN:  no rashes or significant lesions EYES: sclera clear OROPHARYNX:no thrush or ulcers NECK: supple, thyroid normal size, non-tender, without nodularity LYMPH:  no palpable cervical or supraclavicular lymphadenopathy  LUNGS: clear to auscultation with normal breathing effort HEART: regular rate & rhythm and no murmurs and no lower extremity edema ABDOMEN:abdomen soft, non-tender and normal bowel sounds Musculoskeletal:no cyanosis of digits and no clubbing  NEURO: alert &  oriented x 3 with fluent speech, no focal motor or sensory deficits per tuning fork exam  Breast exam s/p right lumpectomy. Incisions are well healed. No palpable mass in either breast or axilla that I could palpate. PAC covered with band-aid   LABORATORY DATA:  I have reviewed the data as listed CBC Latest Ref Rng & Units 11/07/2017 10/17/2017 10/13/2017  WBC 3.9 - 10.3 K/uL 7.5 13.2(H) 6.4  Hemoglobin 11.6 - 15.9 g/dL 11.1(L) 11.5(L) 11.4(L)  Hematocrit 34.8 - 46.6 % 33.2(L) 34.5(L) 35.5  Platelets 145 - 400 K/uL 345 250 403(H)     CMP Latest Ref Rng & Units 11/07/2017 10/17/2017 10/13/2017  Glucose 70 - 99 mg/dL 115(H) 117(H) 226(H)  BUN 6 - 20 mg/dL _0 Creatinine 0.44 - 1.00 mg/dL 0.83 0.83 0.87  Sodium 135 - 145 mmol/L 140 138 139  Potassium 3.5 - 5.1 mmol/L 3.5 3.2(L) 4.0  Chloride 98 - 111 mmol/L 108 101 106  CO2 22 - 32 mmol/L _1 Calcium 8.9 - 10.3 mg/dL 9.3 9.0 9.7  Total Protein 6.5 - 8.1 g/dL 6.9 7.0 7.6  Total Bilirubin 0.3 - 1.2 mg/dL 0.4 0.8 0.3  Alkaline Phos 38 - 126 U/L 64 84 74  AST 15 - 41 U/L 26 32 18  ALT 0 - 44 U/L 27 50(H) 23   PATHOLOGY  Diagnosis 06/26/17 1. Breast, right, needle core biopsy, upper inner quadrant, 12:30 o'clock, 7cm from nipple - INVASIVE DUCTAL CARCINOMA - SEE COMMENT 2. Lymph node, needle/core biopsy, right axillary (level 2 node) - NO CARCINOMA IDENTIFIED IN ONE LYMPH NODE (0/1) Results: HER2 - NEGATIVE RATIO OF HER2/CEP17 SIGNALS 1.21 AVERAGE HER2 COPY NUMBER PER CELL 2.00 Results: IMMUNOHISTOCHEMICAL AND MORPHOMETRIC ANALYSIS PERFORMED MANUALLY Estrogen Receptor: 70%, POSITIVE, STRONG STAINING INTENSITY Progesterone Receptor: 70%, POSITIVE, STRONG STAINING INTENSITY Proliferation Marker Ki67: 60%  RADIOGRAPHIC STUDIES: I have personally reviewed the radiological images as listed and agreed with the findings in the report.  Diagnostic Mammogram 06/23/17 IMPRESSION: 1. There is a highly suspicious mass measuring 3.5  cm mammographically in the right breast at the palpable site identified by the patient. 2. There is 1 suspicious lymph node with cortex measuring up to 6 mm. 3. No mammographic evidence of malignancy in the left breast.   RADIOGRAPHIC STUDIES: I have personally reviewed the radiological images as listed and agreed with the findings in the report. No results found.   ASSESSMENT & PLAN: 52 y.o. pre-menopausal woman, presented with screening discovered to Ductal carcinoma  1.  Malignant neoplasm of the upper-ourter quadrant of right breast, base of ductal carcinoma, pT2N0M0, stage IIA, grade 3, ER+ /PR +, HER2 -, stage IB, Oncotype RS 29 2.  Depression, anxiety 3.  Steroid-induced hyperglycemia 4.  G-CSF induced bone pain 5.  Diarrhea, anorexia secondary to chemo 6. Hypokalemia 7.  Neuropathy in feet, grade 1, secondary to chemotherapy  Ms. Balch appears stable. She has recovered well from adjuvant chemotherapy. She has gained weight back. BP improved to her baseline.  Her diarrhea is overall mild and managed well. She continues B complex for persistent neuropathy. Labs reviewed, CBC and CMP stable. BG 115. She may not require metformin in the future. Will monitor closely.   She will proceed to adjuvant radiation per Dr. Isidore Moos, to start 7/29. Will see her back in 4 weeks towards the end of her radiation course to discuss adjuvant endocrine therapy.   Plan -Labs reviewed -Proceed to adjuvant radiation, to start 7/29 -Lab, f/u with Dr. Burr Medico in 4 weeks to discuss adjuvant endocrine therapy   All questions were answered. The patient knows to call the clinic with any problems, questions or concerns. No barriers to learning was detected.     Caitlin Feeling, NP 11/07/17

## 2017-11-07 NOTE — Telephone Encounter (Signed)
Spoke with patient regarding adding appointments to her schedule.  I also called [facility] to see if her Final Treatment appt time could be moved up.  [place] stated that she would just leave it at current time and work her in after visit with Lacie/ per 7/26 los

## 2017-11-10 ENCOUNTER — Ambulatory Visit
Admission: RE | Admit: 2017-11-10 | Discharge: 2017-11-10 | Disposition: A | Discharge status: Home / Self Care | Payer: BLUE CROSS/BLUE SHIELD | Source: Ambulatory Visit | Attending: Radiation Oncology | Admitting: Radiation Oncology

## 2017-11-10 DIAGNOSIS — C50211 Malignant neoplasm of upper-inner quadrant of right female breast: Secondary | ICD-10-CM | POA: Diagnosis not present

## 2017-11-10 DIAGNOSIS — Z170 Estrogen receptor positive status [ER+]: Principal | ICD-10-CM

## 2017-11-10 MED ORDER — ALRA NON-METALLIC DEODORANT (RAD-ONC)
1.0000 "application " | Freq: Once | TOPICAL | Status: AC
  Administered 2017-11-10: 1 via TOPICAL

## 2017-11-10 MED ORDER — RADIAPLEXRX EX GEL
Freq: Once | CUTANEOUS | Status: AC
  Administered 2017-11-10: 16:00:00 via TOPICAL

## 2017-11-10 NOTE — Progress Notes (Signed)
Pt here for patient teaching.  Pt given Radiation and You booklet, skin care instructions, [place]ra deodorant and Radiaplex gel.  Reviewed areas of pertinence such as fatigue, hair loss, skin changes, breast tenderness and breast swelling . Pt able to give teach back of to pat skin and use unscented/gentle soap,apply Radiaplex bid, avoid applying anything to skin within 4 hours of treatment, avoid wearing an under wire bra and to use an electric razor if they must shave. Pt demonstrated understanding, needs reinforcement, no evidence of learning, refused teaching and  of information given and will contact nursing with any questions or concerns.     [email or web address]      

## 2017-11-11 ENCOUNTER — Ambulatory Visit
Admission: RE | Admit: 2017-11-11 | Discharge: 2017-11-11 | Disposition: A | Discharge status: Home / Self Care | Payer: BLUE CROSS/BLUE SHIELD | Source: Ambulatory Visit | Attending: Radiation Oncology | Admitting: Radiation Oncology

## 2017-11-11 DIAGNOSIS — C50211 Malignant neoplasm of upper-inner quadrant of right female breast: Secondary | ICD-10-CM | POA: Diagnosis not present

## 2017-11-12 ENCOUNTER — Ambulatory Visit
Admission: RE | Admit: 2017-11-12 | Discharge: 2017-11-12 | Disposition: A | Discharge status: Home / Self Care | Payer: BLUE CROSS/BLUE SHIELD | Source: Ambulatory Visit | Attending: Radiation Oncology | Admitting: Radiation Oncology

## 2017-11-12 DIAGNOSIS — C50211 Malignant neoplasm of upper-inner quadrant of right female breast: Secondary | ICD-10-CM | POA: Diagnosis not present

## 2017-11-13 ENCOUNTER — Ambulatory Visit
Admission: RE | Admit: 2017-11-13 | Discharge: 2017-11-13 | Disposition: A | Discharge status: Home / Self Care | Payer: BLUE CROSS/BLUE SHIELD | Source: Ambulatory Visit | Attending: Radiation Oncology | Admitting: Radiation Oncology

## 2017-11-13 DIAGNOSIS — Z510 Encounter for antineoplastic radiation therapy: Secondary | ICD-10-CM | POA: Insufficient documentation

## 2017-11-13 DIAGNOSIS — Z170 Estrogen receptor positive status [ER+]: Secondary | ICD-10-CM | POA: Insufficient documentation

## 2017-11-13 DIAGNOSIS — C50211 Malignant neoplasm of upper-inner quadrant of right female breast: Secondary | ICD-10-CM | POA: Insufficient documentation

## 2017-11-14 ENCOUNTER — Ambulatory Visit
Admission: RE | Admit: 2017-11-14 | Discharge: 2017-11-14 | Disposition: A | Discharge status: Home / Self Care | Payer: BLUE CROSS/BLUE SHIELD | Source: Ambulatory Visit | Attending: Radiation Oncology | Admitting: Radiation Oncology

## 2017-11-14 DIAGNOSIS — Z510 Encounter for antineoplastic radiation therapy: Secondary | ICD-10-CM | POA: Diagnosis not present

## 2017-11-17 ENCOUNTER — Ambulatory Visit
Admission: RE | Admit: 2017-11-17 | Discharge: 2017-11-17 | Disposition: A | Discharge status: Home / Self Care | Payer: BLUE CROSS/BLUE SHIELD | Source: Ambulatory Visit | Attending: Radiation Oncology | Admitting: Radiation Oncology

## 2017-11-17 DIAGNOSIS — Z510 Encounter for antineoplastic radiation therapy: Secondary | ICD-10-CM | POA: Diagnosis not present

## 2017-11-18 ENCOUNTER — Ambulatory Visit
Admission: RE | Admit: 2017-11-18 | Discharge: 2017-11-18 | Disposition: A | Discharge status: Home / Self Care | Payer: BLUE CROSS/BLUE SHIELD | Source: Ambulatory Visit | Attending: Radiation Oncology | Admitting: Radiation Oncology

## 2017-11-18 DIAGNOSIS — Z510 Encounter for antineoplastic radiation therapy: Secondary | ICD-10-CM | POA: Diagnosis not present

## 2017-11-19 ENCOUNTER — Ambulatory Visit
Admission: RE | Admit: 2017-11-19 | Discharge: 2017-11-19 | Disposition: A | Discharge status: Home / Self Care | Payer: BLUE CROSS/BLUE SHIELD | Source: Ambulatory Visit | Attending: Radiation Oncology | Admitting: Radiation Oncology

## 2017-11-19 DIAGNOSIS — Z510 Encounter for antineoplastic radiation therapy: Secondary | ICD-10-CM | POA: Diagnosis not present

## 2017-11-20 ENCOUNTER — Ambulatory Visit: Payer: BLUE CROSS/BLUE SHIELD

## 2017-11-21 ENCOUNTER — Ambulatory Visit
Admission: RE | Admit: 2017-11-21 | Discharge: 2017-11-21 | Disposition: A | Discharge status: Home / Self Care | Payer: BLUE CROSS/BLUE SHIELD | Source: Ambulatory Visit | Attending: Radiation Oncology | Admitting: Radiation Oncology

## 2017-11-21 DIAGNOSIS — Z510 Encounter for antineoplastic radiation therapy: Secondary | ICD-10-CM | POA: Diagnosis not present

## 2017-11-24 ENCOUNTER — Ambulatory Visit
Admission: RE | Admit: 2017-11-24 | Discharge: 2017-11-24 | Disposition: A | Discharge status: Home / Self Care | Payer: BLUE CROSS/BLUE SHIELD | Source: Ambulatory Visit | Attending: Radiation Oncology | Admitting: Radiation Oncology

## 2017-11-24 ENCOUNTER — Ambulatory Visit: Payer: BLUE CROSS/BLUE SHIELD | Admitting: Radiation Oncology

## 2017-11-24 DIAGNOSIS — Z510 Encounter for antineoplastic radiation therapy: Secondary | ICD-10-CM | POA: Diagnosis not present

## 2017-11-25 ENCOUNTER — Ambulatory Visit
Admission: RE | Admit: 2017-11-25 | Discharge: 2017-11-25 | Disposition: A | Discharge status: Home / Self Care | Payer: BLUE CROSS/BLUE SHIELD | Source: Ambulatory Visit | Attending: Radiation Oncology | Admitting: Radiation Oncology

## 2017-11-25 DIAGNOSIS — Z510 Encounter for antineoplastic radiation therapy: Secondary | ICD-10-CM | POA: Diagnosis not present

## 2017-11-26 ENCOUNTER — Ambulatory Visit
Admission: RE | Admit: 2017-11-26 | Discharge: 2017-11-26 | Disposition: A | Discharge status: Home / Self Care | Payer: BLUE CROSS/BLUE SHIELD | Source: Ambulatory Visit | Attending: Radiation Oncology | Admitting: Radiation Oncology

## 2017-11-26 DIAGNOSIS — C50211 Malignant neoplasm of upper-inner quadrant of right female breast: Secondary | ICD-10-CM

## 2017-11-26 DIAGNOSIS — Z170 Estrogen receptor positive status [ER+]: Principal | ICD-10-CM

## 2017-11-26 DIAGNOSIS — Z510 Encounter for antineoplastic radiation therapy: Secondary | ICD-10-CM | POA: Diagnosis not present

## 2017-11-26 MED ORDER — RADIAPLEXRX EX GEL
Freq: Once | CUTANEOUS | Status: AC
  Administered 2017-11-26: 17:00:00 via TOPICAL

## 2017-11-27 ENCOUNTER — Ambulatory Visit
Admission: RE | Admit: 2017-11-27 | Discharge: 2017-11-27 | Disposition: A | Discharge status: Home / Self Care | Payer: BLUE CROSS/BLUE SHIELD | Source: Ambulatory Visit | Attending: Radiation Oncology | Admitting: Radiation Oncology

## 2017-11-27 DIAGNOSIS — Z510 Encounter for antineoplastic radiation therapy: Secondary | ICD-10-CM | POA: Diagnosis not present

## 2017-11-28 ENCOUNTER — Ambulatory Visit
Admission: RE | Admit: 2017-11-28 | Discharge: 2017-11-28 | Disposition: A | Discharge status: Home / Self Care | Payer: BLUE CROSS/BLUE SHIELD | Source: Ambulatory Visit | Attending: Radiation Oncology | Admitting: Radiation Oncology

## 2017-11-28 DIAGNOSIS — Z510 Encounter for antineoplastic radiation therapy: Secondary | ICD-10-CM | POA: Diagnosis not present

## 2017-11-30 ENCOUNTER — Ambulatory Visit: Admission: RE | Admit: 2017-11-30 | Payer: BLUE CROSS/BLUE SHIELD | Source: Ambulatory Visit

## 2017-12-01 ENCOUNTER — Ambulatory Visit
Admission: RE | Admit: 2017-12-01 | Discharge: 2017-12-01 | Disposition: A | Discharge status: Home / Self Care | Payer: BLUE CROSS/BLUE SHIELD | Source: Ambulatory Visit | Attending: Radiation Oncology | Admitting: Radiation Oncology

## 2017-12-01 ENCOUNTER — Telehealth: Payer: Self-pay | Admitting: *Deleted

## 2017-12-01 ENCOUNTER — Ambulatory Visit: Payer: BLUE CROSS/BLUE SHIELD

## 2017-12-01 DIAGNOSIS — Z510 Encounter for antineoplastic radiation therapy: Secondary | ICD-10-CM | POA: Diagnosis not present

## 2017-12-01 NOTE — Telephone Encounter (Signed)
ROI faxed to [facility] Record Retrieval - att: Juliann Pares; release [phone]

## 2017-12-02 ENCOUNTER — Ambulatory Visit: Payer: BLUE CROSS/BLUE SHIELD

## 2017-12-02 ENCOUNTER — Ambulatory Visit
Admission: RE | Admit: 2017-12-02 | Discharge: 2017-12-02 | Disposition: A | Discharge status: Home / Self Care | Payer: BLUE CROSS/BLUE SHIELD | Source: Ambulatory Visit | Attending: Radiation Oncology | Admitting: Radiation Oncology

## 2017-12-02 DIAGNOSIS — Z510 Encounter for antineoplastic radiation therapy: Secondary | ICD-10-CM | POA: Diagnosis not present

## 2017-12-03 ENCOUNTER — Ambulatory Visit
Admission: RE | Admit: 2017-12-03 | Discharge: 2017-12-03 | Disposition: A | Discharge status: Home / Self Care | Payer: BLUE CROSS/BLUE SHIELD | Source: Ambulatory Visit | Attending: Radiation Oncology | Admitting: Radiation Oncology

## 2017-12-03 DIAGNOSIS — Z510 Encounter for antineoplastic radiation therapy: Secondary | ICD-10-CM | POA: Diagnosis not present

## 2017-12-04 ENCOUNTER — Ambulatory Visit: Payer: BLUE CROSS/BLUE SHIELD

## 2017-12-04 ENCOUNTER — Ambulatory Visit
Admission: RE | Admit: 2017-12-04 | Discharge: 2017-12-04 | Disposition: A | Discharge status: Home / Self Care | Payer: BLUE CROSS/BLUE SHIELD | Source: Ambulatory Visit | Attending: Radiation Oncology | Admitting: Radiation Oncology

## 2017-12-04 DIAGNOSIS — Z510 Encounter for antineoplastic radiation therapy: Secondary | ICD-10-CM | POA: Diagnosis not present

## 2017-12-05 ENCOUNTER — Inpatient Hospital Stay (HOSPITAL_BASED_OUTPATIENT_CLINIC_OR_DEPARTMENT_OTHER): Payer: BLUE CROSS/BLUE SHIELD | Admitting: Nurse Practitioner

## 2017-12-05 ENCOUNTER — Encounter: Payer: Self-pay | Admitting: Nurse Practitioner

## 2017-12-05 ENCOUNTER — Ambulatory Visit: Payer: BLUE CROSS/BLUE SHIELD

## 2017-12-05 ENCOUNTER — Inpatient Hospital Stay: Payer: BLUE CROSS/BLUE SHIELD

## 2017-12-05 ENCOUNTER — Ambulatory Visit
Admission: RE | Admit: 2017-12-05 | Discharge: 2017-12-05 | Disposition: A | Discharge status: Home / Self Care | Payer: BLUE CROSS/BLUE SHIELD | Source: Ambulatory Visit | Attending: Radiation Oncology | Admitting: Radiation Oncology

## 2017-12-05 ENCOUNTER — Telehealth: Payer: Self-pay | Admitting: Nurse Practitioner

## 2017-12-05 ENCOUNTER — Inpatient Hospital Stay: Payer: BLUE CROSS/BLUE SHIELD | Attending: Hematology

## 2017-12-05 VITALS — BP 148/91 | HR 86 | Temp 98.6°F | Resp 18 | Ht 67.0 in | Wt 317.8 lb

## 2017-12-05 DIAGNOSIS — M898X9 Other specified disorders of bone, unspecified site: Secondary | ICD-10-CM | POA: Insufficient documentation

## 2017-12-05 DIAGNOSIS — R7309 Other abnormal glucose: Secondary | ICD-10-CM

## 2017-12-05 DIAGNOSIS — Z170 Estrogen receptor positive status [ER+]: Secondary | ICD-10-CM

## 2017-12-05 DIAGNOSIS — T451X5A Adverse effect of antineoplastic and immunosuppressive drugs, initial encounter: Secondary | ICD-10-CM | POA: Insufficient documentation

## 2017-12-05 DIAGNOSIS — C50211 Malignant neoplasm of upper-inner quadrant of right female breast: Secondary | ICD-10-CM

## 2017-12-05 DIAGNOSIS — G620 Drug-induced polyneuropathy: Secondary | ICD-10-CM

## 2017-12-05 DIAGNOSIS — Z95828 Presence of other vascular implants and grafts: Secondary | ICD-10-CM

## 2017-12-05 DIAGNOSIS — F418 Other specified anxiety disorders: Secondary | ICD-10-CM | POA: Insufficient documentation

## 2017-12-05 DIAGNOSIS — Z510 Encounter for antineoplastic radiation therapy: Secondary | ICD-10-CM | POA: Diagnosis not present

## 2017-12-05 LAB — CBC WITH DIFFERENTIAL (CANCER CENTER ONLY)
Basophils Absolute: 0 10*3/uL (ref 0.0–0.1)
Basophils Relative: 1 %
Eosinophils Absolute: 0.1 10*3/uL (ref 0.0–0.5)
Eosinophils Relative: 2 %
HCT: 36.4 % (ref 34.8–46.6)
Hemoglobin: 11.8 g/dL (ref 11.6–15.9)
Lymphocytes Relative: 28 %
Lymphs Abs: 1.6 10*3/uL (ref 0.9–3.3)
MCH: 27.4 pg (ref 25.1–34.0)
MCHC: 32.3 g/dL (ref 31.5–36.0)
MCV: 84.7 fL (ref 79.5–101.0)
Monocytes Absolute: 0.3 10*3/uL (ref 0.1–0.9)
Monocytes Relative: 5 %
Neutro Abs: 3.7 10*3/uL (ref 1.5–6.5)
Neutrophils Relative %: 64 %
Platelet Count: 279 10*3/uL (ref 145–400)
RBC: 4.3 MIL/uL (ref 3.70–5.45)
RDW: 16.2 % — ABNORMAL HIGH (ref 11.2–14.5)
WBC Count: 5.6 10*3/uL (ref 3.9–10.3)

## 2017-12-05 LAB — CMP (CANCER CENTER ONLY)
ALT: 18 U/L (ref 0–44)
AST: 19 U/L (ref 15–41)
Albumin: 3.5 g/dL (ref 3.5–5.0)
Alkaline Phosphatase: 69 U/L (ref 38–126)
Anion gap: 8 (ref 5–15)
BUN: 10 mg/dL (ref 6–20)
CO2: 27 mmol/L (ref 22–32)
Calcium: 9.2 mg/dL (ref 8.9–10.3)
Chloride: 107 mmol/L (ref 98–111)
Creatinine: 0.8 mg/dL (ref 0.44–1.00)
GFR, Est AFR Am: >60 mL/min
GFR, Estimated: >60 mL/min
Glucose, Bld: 119 mg/dL — ABNORMAL HIGH (ref 70–99)
Potassium: 3.3 mmol/L — ABNORMAL LOW (ref 3.5–5.1)
Sodium: 142 mmol/L (ref 135–145)
Total Bilirubin: 0.4 mg/dL (ref 0.3–1.2)
Total Protein: 7.2 g/dL (ref 6.5–8.1)

## 2017-12-05 MED ORDER — TAMOXIFEN CITRATE 20 MG PO TABS: 20.0000 mg | ORAL_TABLET | Freq: Every day | ORAL | 0 refills | Status: DC

## 2017-12-05 MED ORDER — METFORMIN HCL 500 MG PO TABS: 500.0000 mg | ORAL_TABLET | Freq: Two times a day (BID) | ORAL | 0 refills | Status: DC

## 2017-12-05 MED ORDER — GABAPENTIN 100 MG PO CAPS: 100.0000 mg | ORAL_CAPSULE | Freq: Every day | ORAL | 1 refills | Status: DC

## 2017-12-05 MED ORDER — HEPARIN SOD (PORK) LOCK FLUSH 100 UNIT/ML IV SOLN
500.0000 [IU] | Freq: Once | INTRAVENOUS | Status: AC | PRN
  Administered 2017-12-05: 500 [IU]
  Filled 2017-12-05: qty 5

## 2017-12-05 MED ORDER — SODIUM CHLORIDE 0.9% FLUSH
10.0000 mL | INTRAVENOUS | Status: DC | PRN
  Administered 2017-12-05: 10 mL
  Filled 2017-12-05: qty 10

## 2017-12-05 NOTE — Telephone Encounter (Signed)
Scheduled appt per 8/23 los - gave patient AVS and calender per los.  

## 2017-12-05 NOTE — Progress Notes (Signed)
[place]  [phone] [phone]  Clinic Follow up Note   Patient Care Team: Lucianne Lei, MD as PCP - General (Family Medicine) Truitt Merle, MD as Consulting Physician (Hematology) [name], MD as Consulting Physician (General Surgery) [name], MD as Attending Physician (Radiation Oncology) Date of Service: 12/05/17   SUMMARY OF ONCOLOGIC HISTORY: Oncology History   Cancer Staging Malignant neoplasm of upper-inner quadrant of right breast in female, estrogen receptor positive ([place]) Staging form: Breast, AJCC 8th Edition - Clinical stage from 06/26/2017: Stage IIA (cT2, cN0, cM0, G3, ER: Positive, PR: Positive, HER2: Negative) - Signed by Truitt Merle, MD on 07/01/2017 - Pathologic stage from 07/08/2017: Stage IB (pT2, pN0, cM0, G3, ER+, PR+, HER2-, Oncotype DX score: 29) - Signed by [name], NP on 07/25/2017       Malignant neoplasm of upper-inner quadrant of right breast in female, estrogen receptor positive ([place])   06/23/2017 Mammogram    IMPRESSION: 1. There is a highly suspicious mass measuring 3.5 cm mammographically in the right breast at the palpable site identified by the patient. 2. There is 1 suspicious lymph node with cortex measuring up to 6 mm. 3.  No mammographic evidence of malignancy in the left breast.    06/26/2017 Initial Biopsy    Diagnosis 06/26/17 1. Breast, right, needle core biopsy, upper inner quadrant, 12:30 o'clock, 7cm from nipple - INVASIVE DUCTAL CARCINOMA - SEE COMMENT 2. Lymph node, needle/core biopsy, right axillary (level 2 node) - NO CARCINOMA IDENTIFIED IN ONE LYMPH NODE (0/1)    06/26/2017 Receptors her2    Prognostic indicators significant for: ER, 70% positive and PR, 70% positive, both with strong staining intensity. Proliferation marker Ki67 at 60%. HER2 negative.    07/01/2017 Initial Diagnosis    Malignant neoplasm of upper-inner quadrant of right breast in female, estrogen  receptor positive ([place])    07/08/2017 Pathology Results    Diagnosis 1. Breast, lumpectomy, Right - INVASIVE DUCTAL CARCINOMA, [place]HAM GRADE 3 OF 3, 3.5 CM - MARGINS UNINVOLVED BY CARCINOMA (0.1 CM, SUPERIOR MARGIN) - PREVIOUS BIOPSY SITE CHANGES - SEE ONCOLOGY TABLE BELOW 2. Soft tissue, biopsy, Axillary - BENIGN FIBROADIPOSE TISSUE - NO MALIGNANCY IDENTIFIED 3. Lymph node, sentinel, biopsy, Right axillary - NO CARCINOMA IDENTIFIED IN ONE LYMPH NODE (0/1) 4. Lymph node, sentinel, biopsy, Right axillary - NO CARCINOMA IDENTIFIED IN ONE LYMPH NODE (0/1) - PREVIOUS BIOPSY SITE CHANGES - SEE COMMENT 5. Breast, excision, Right superior margin - BENIGN BREAST TISSUE - NO RESIDUAL CARCINOMA IDENTIFIED 6. Breast, excision, Right inferior margin - BENIGN BREAST TISSUE - NO RESIDUAL CARCINOMA IDENTIFIED    07/08/2017 Cancer Staging    Staging form: Breast, AJCC 8th Edition - Pathologic stage from 07/08/2017: Stage IB (pT2, pN0, cM0, G3, ER+, PR+, HER2-, Oncotype DX score: 29) - Signed by [name], NP on 07/25/2017    07/22/2017 Oncotype testing    Recurrence Score: 29 Distant Recurrence Risk at 9 years with Ai or Tamoxifen alone: 18% Absolute Chemotherapy Benefit: > 15 %    08/07/2017 - 10/13/2017 Chemotherapy    Adjuvant chemo Taxotere/Cytoxanq3 weeks x4 cycles with Udenyca -Granix added from cycle 2. Discontinued Udenyca following cycle 2.    CURRENT THERAPY: Adjuvant radiation to right breast starting 11/10/17, to complete 8/26   INTERVAL HISTORY: Caitlin Cohen returns for follow up as scheduled. She is nearing the end of adjuvant radiation to right breast, to complete on 8/26. She is tolerating well with mild fatigue, darkened skin,  and breast soreness. Treatment has gone better than she expected. Appetite is normal. No GI complaints. Denies cough, chest pain, or dyspnea. She does report worsening neuropathy discomfort especially at night. Using a pen is difficult. Feet feel tight at  night. Not affecting ambulation or balance.   REVIEW OF SYSTEMS:   Constitutional: Denies fevers, chills or abnormal weight loss Respiratory: Denies cough, dyspnea or wheezes Cardiovascular: Denies palpitation, chest discomfort or lower extremity swelling Gastrointestinal:  Denies nausea, vomiting, constipation, diarrhea, heartburn or change in bowel habits Skin: Denies abnormal skin rashes (+) right breast darkening  Lymphatics: Denies new lymphadenopathy or easy bruising Neurological:Denies new weaknesses (+) neuropathy, worse at night with some functional difficulties  Behavioral/Psych: Mood is stable, no new changes  Breast: (+) R: soreness, darkening  All other systems were reviewed with the patient and are negative.  MEDICAL HISTORY:  Past Medical History:  Diagnosis Date  . Anemia   . Anxiety   . Cancer [facility])    breast cancer right  . Depression   . GERD (gastroesophageal reflux disease)   . Headache     SURGICAL HISTORY: Past Surgical History:  Procedure Laterality Date  . CERVICAL CONE BIOPSY    . CHOLECYSTECTOMY    . MASTECTOMY WITH RADIOACTIVE SEED GUIDED EXCISION AND AXILLARY SENTINEL LYMPH NODE BIOPSY Right 07/08/2017   Procedure: RIGHT LUMPTECTOMY WITH RADIOACTIVE SEED GUIDED EXCISION AND RIGHT AXILLARY SENTINEL LYMPH NODE BIOPSY ERAS PATHWAY;  Surgeon: [name], MD;  Location: [place];  Service: General;  Laterality: Right;  PEC BLOCK  . OPEN REDUCTION INTERNAL FIXATION (ORIF) DISTAL RADIAL FRACTURE  03/17/2012   Procedure: OPEN REDUCTION INTERNAL FIXATION (ORIF) DISTAL RADIAL FRACTURE;  Surgeon: Roseanne Kaufman, MD;  Location: [place];  Service: Orthopedics;  Laterality: Right;  Pre-operative a supra-clavical block right arm in addition to general anesthesia  . PORTACATH PLACEMENT N/A 07/30/2017   Procedure: INSERTION PORT-A-CATH WITH [place];  Surgeon: [name], MD;  Location: [place];  Service: General;  Laterality: N/A;  . TUBAL LIGATION      I have  reviewed the social history and family history with the patient and they are unchanged from previous note.  ALLERGIES:  has No Known Allergies.  MEDICATIONS:  Current Outpatient Medications  Medication Sig Dispense Refill  . metFORMIN (GLUCOPHAGE) 500 MG tablet Take 1 tablet (500 mg total) by mouth 2 (two) times daily with a meal. 60 tablet 0  . diphenoxylate-atropine (LOMOTIL) 2.5-0.025 MG tablet Take 1-2 tablets by mouth 4 (four) times daily as needed for diarrhea or loose stools. (Patient not taking: Reported on 12/05/2017) 30 tablet 1  . gabapentin (NEURONTIN) 100 MG capsule Take 1 capsule (100 mg total) by mouth at bedtime. 30 capsule 1  . ibuprofen (ADVIL,MOTRIN) 200 MG tablet Take 400 mg by mouth daily as needed for mild pain or moderate pain.    . tamoxifen (NOLVADEX) 20 MG tablet Take 1 tablet (20 mg total) by mouth daily. 90 tablet 0   No current facility-administered medications for this visit.     PHYSICAL EXAMINATION: ECOG PERFORMANCE STATUS: 1 - Symptomatic but completely ambulatory  Vitals:   12/05/17 1435  BP: (!) 148/91  Pulse: 86  Resp: 18  Temp: 98.6 F (37 C)  SpO2: 100%   Filed Weights   12/05/17 1435  Weight: (!) 317 lb 12.8 oz (144.2 kg)    GENERAL:alert, no distress and comfortable SKIN: right breast hyperpigmentation with radiation changes, otherwise skin color, texture, turgor are normal EYES: sclera clear  LUNGS: clear to auscultation with normal breathing effort HEART: regular rate & rhythm, no lower extremity edema ABDOMEN:abdomen soft, non-tender and normal bowel sounds Musculoskeletal:no cyanosis of digits and no clubbing  NEURO: alert & oriented x 3 with fluent speech, no focal motor deficits. Mildly decreased peripheral vibratory sense to feet per tuning fork exam  Breast: inspection shows slightly edematous right breast with diffuse mild erythema and mild-moderate hyperpigmentation. Incision healed well. No open wounds.  PAC without erythema     LABORATORY DATA:  I have reviewed the data as listed CBC Latest Ref Rng & Units 12/05/2017 11/07/2017 10/17/2017  WBC 3.9 - 10.3 K/uL 5.6 7.5 13.2(H)  Hemoglobin 11.6 - 15.9 g/dL 11.8 11.1(L) 11.5(L)  Hematocrit 34.8 - 46.6 % 36.4 33.2(L) 34.5(L)  Platelets 145 - 400 K/uL 279 345 250     CMP Latest Ref Rng & Units 12/05/2017 11/07/2017 10/17/2017  Glucose 70 - 99 mg/dL 119(H) 115(H) 117(H)  BUN 6 - 20 mg/dL 10 14 13   Creatinine 0.44 - 1.00 mg/dL 0.80 0.83 0.83  Sodium 135 - 145 mmol/L 142 140 138  Potassium 3.5 - 5.1 mmol/L 3.3(L) 3.5 3.2(L)  Chloride 98 - 111 mmol/L 107 108 101  CO2 22 - 32 mmol/L 27 24 27   Calcium 8.9 - 10.3 mg/dL 9.2 9.3 9.0  Total Protein 6.5 - 8.1 g/dL 7.2 6.9 7.0  Total Bilirubin 0.3 - 1.2 mg/dL 0.4 0.4 0.8  Alkaline Phos 38 - 126 U/L 69 64 84  AST 15 - 41 U/L 19 26 32  ALT 0 - 44 U/L 18 27 50(H)      RADIOGRAPHIC STUDIES: I have personally reviewed the radiological images as listed and agreed with the findings in the report. No results found.   ASSESSMENT & PLAN: 52 y.o.pre-menopausal woman, presented with screening discovered to Ductal carcinoma  1. Malignant neoplasm of the upper-ourter quadrant of right breast, base of ductal carcinoma, pT2N0M0, stage IIA, grade 3, ER+ /PR +, HER2 -, stage IB, Oncotype RS 29 2.  Depression, anxiety 3.  Steroid-induced hyperglycemia 4.  G-CSF induced bone pain 5.  Diarrhea, anorexia secondary to chemo 6. Hypokalemia 7.  Neuropathy in feet, grade 1, secondary to chemotherapy   Caitlin Cohen is doing well. She is nearing completion of adjuvant right breast radiation, she has tolerated well with mild fatigue and mild-moderate skin change. She will complete her last done on 8/26. She continues to have peripheral neuropathy secondary to chemotherapy, G2. B complex vitamin is not effective. She has some functional difficulties with dexterity and slightly decreased vibratory sense to her feet. Will try low dose gabapentin 100  mg qHS. She can titrate slowly if needed if she is tolerating well after a few weeks. I reviewed potential referral to PT in the future if she has further difficulties affecting ambulation, balance, etc.   Given the strong ER and PR positivity of her breast cancer, I do recommend adjuvant anti-estrogen to reduce her risk of cancer recurrence,  The potential benefit and side effects, which includes but not limited to, hot flash, skin and vaginal dryness, metabolic changes ( increased blood glucose, cholesterol, weight, etc.), slightly in increased risk of cardiovascular disease, cataracts, muscular and joint discomfort, osteopenia and osteoporosis, etc, were discussed with her in great details. She is perimenopausal, LMP 05/2017. She is interested, and we'll start tamoxifen after she completes radiation. I reviewed slightly increased risk of endometrial cancer, as well as the protective effect on the bones associated with tamoxifen. She agrees  to proceed.   We discussed follow up after she completes treatment. She will undergo annual mammography and routine f/u. We will see her back in 6 weeks with lab and flush, to monitor side effects related to tamoxifen. She will have her PAC removed in the future.   PLAN: -Labs reviewed -Begin adjuvant tamoxifen 20 mg daily, start in 2 weeks  -Complete radiation 8/26 -Begin gabapentin for neuropathy  -Refilled metformin, she has f/u with PCP but will run out  -Lab, flush, f/u with Dr. Burr Medico in 6 weeks   All questions were answered. The patient knows to call the clinic with any problems, questions or concerns. No barriers to learning was detected. I spent 20 minutes counseling the patient face to face. The total time spent in the appointment was 25 minutes and more than 50% was on counseling and review of test results     [name], NP 12/06/17

## 2017-12-05 NOTE — Patient Instructions (Signed)

## 2017-12-08 ENCOUNTER — Encounter: Payer: Self-pay | Admitting: *Deleted

## 2017-12-08 ENCOUNTER — Ambulatory Visit
Admission: RE | Admit: 2017-12-08 | Discharge: 2017-12-08 | Disposition: A | Discharge status: Home / Self Care | Payer: BLUE CROSS/BLUE SHIELD | Source: Ambulatory Visit | Attending: Radiation Oncology | Admitting: Radiation Oncology

## 2017-12-08 ENCOUNTER — Encounter: Payer: Self-pay | Admitting: Radiation Oncology

## 2017-12-08 ENCOUNTER — Ambulatory Visit: Payer: BLUE CROSS/BLUE SHIELD

## 2017-12-08 DIAGNOSIS — Z510 Encounter for antineoplastic radiation therapy: Secondary | ICD-10-CM | POA: Diagnosis not present

## 2017-12-26 NOTE — Progress Notes (Signed)
  Radiation Oncology         (336) [phone] ________________________________  Name: Caitlin Cohen MRN: [id number]  Date: 12/08/2017  DOB: [birth year]  End of Treatment Note  Diagnosis:   Stage pT2 pN0 Right Breast UIQ Invasive Ductal Carcinoma, ER+ / PR+ / Her2-, Grade 3  Indication for treatment:  Curative       Radiation treatment dates:   11/10/2017 to 12/08/2017  Site/dose:    1. The Right breast was treated to 40.05 Gy in 15 fractions of 2.67 Gy. 2. The Right breast was boosted to 10 Gy in 5 fractions of 2 Gy.  Beams/energy:    1. 3D// 10X 2. Isodose plan w/photons // 10X, 6X  Narrative: The patient tolerated radiation treatment relatively well.   She developed moderate hyperpigmentation to the right breast with treatment. She denies any pain or fatigue. She continues to use radiaplex twice daily as directed.   Plan: The patient has completed radiation treatment. The patient will return to radiation oncology clinic for routine followup in one month. I advised them to call or return sooner if they have any questions or concerns related to their recovery or treatment.  -----------------------------------  [name], MD   This document serves as a record of services personally performed by [name], MD. It was created on her behalf by Arlyce Harman, a trained medical scribe. The creation of this record is based on the scribe's personal observations and the provider's statements to them. This document has been checked and approved by the attending provider.

## 2018-01-05 ENCOUNTER — Encounter: Payer: Self-pay | Admitting: Radiation Oncology

## 2018-01-05 NOTE — Progress Notes (Signed)
Ms. Lookingbill presents for follow up of radiation completed 12/08/17 to her right breast. She saw [name] NP on 12/05/17 and will see Dr. Burr Medico on 01/16/18. She will see survivorship on 05/05/18. She has started taking Tamoxifen. She reports some peeling after completing radiation. She has continued using radiaplex twice daily. She will begin to use a vitamin E containing lotion when the radiaplex is completed.   BP (!) 140/101 (BP Location: Left Arm)   Pulse 92   Temp 98.1 F (36.7 C) (Oral)   Ht 5\' 7"  (1.702 m)   Wt (!) 315 lb 12.8 oz (143.2 kg)   SpO2 100% Comment: room air  BMI 49.46 kg/m    Wt Readings from Last 3 Encounters:  01/09/18 (!) 315 lb 12.8 oz (143.2 kg)  12/05/17 (!) 317 lb 12.8 oz (144.2 kg)  11/07/17 (!) 323 lb 8 oz (146.7 kg)

## 2018-01-09 ENCOUNTER — Ambulatory Visit
Admission: RE | Admit: 2018-01-09 | Discharge: 2018-01-09 | Disposition: A | Discharge status: Home / Self Care | Payer: BLUE CROSS/BLUE SHIELD | Source: Ambulatory Visit | Attending: Radiation Oncology | Admitting: Radiation Oncology

## 2018-01-09 ENCOUNTER — Other Ambulatory Visit: Payer: Self-pay

## 2018-01-09 ENCOUNTER — Encounter: Payer: Self-pay | Admitting: Radiation Oncology

## 2018-01-09 VITALS — BP 140/101 | HR 92 | Temp 98.1°F | Ht 67.0 in | Wt 315.8 lb

## 2018-01-09 DIAGNOSIS — Z170 Estrogen receptor positive status [ER+]: Secondary | ICD-10-CM | POA: Diagnosis not present

## 2018-01-09 DIAGNOSIS — Z923 Personal history of irradiation: Secondary | ICD-10-CM | POA: Insufficient documentation

## 2018-01-09 DIAGNOSIS — Z79899 Other long term (current) drug therapy: Secondary | ICD-10-CM | POA: Diagnosis not present

## 2018-01-09 DIAGNOSIS — C50211 Malignant neoplasm of upper-inner quadrant of right female breast: Secondary | ICD-10-CM | POA: Diagnosis not present

## 2018-01-09 DIAGNOSIS — Z7984 Long term (current) use of oral hypoglycemic drugs: Secondary | ICD-10-CM | POA: Diagnosis not present

## 2018-01-09 HISTORY — DX: Personal history of irradiation: Z92.3

## 2018-01-09 NOTE — Progress Notes (Signed)
Radiation Oncology         (336) [phone] ________________________________  Name: Caitlin Cohen MRN: [id number]  Date: 01/09/2018  DOB: 1966-01-01  Follow-Up Visit Note  Outpatient  CC: Lucianne Lei, MD  [name], MD  Diagnosis and Prior Radiotherapy:    ICD-10-CM   1. Malignant neoplasm of upper-inner quadrant of right breast in female, estrogen receptor positive ([place]) C50.211    Z17.0     StagepT2 pN0 RightBreast UIQInvasive DuctalCarcinoma, ER+/ PR+/ Her2-, Grade3  11/10/2017 - 12/08/2017:  The Right breast was treated to 40.05 Gy in 15 fractions of 2.67 Gy. The Right breast was boosted to 10 Gy in 5 fractions of 2 Gy.  CHIEF COMPLAINT: Here for follow-up and surveillance of right breast cancer  Narrative:  The patient returns today for routine follow-up of radiation completed 12/08/2017 to her right breast. She saw [name] NP on 12/05/2017 and will see Dr. Burr Medico on 01/16/2018. She is scheduled to meet with survivorship on 05/05/2018. She has started taking Tamoxifen.  She reports some peeling after completing radiation. She has continued using radiaplex twice daily. She will begin to use a vitamin E containing lotion when the radiaplex is completed.                               ALLERGIES:  has No Known Allergies.  Meds: Current Outpatient Medications  Medication Sig Dispense Refill  . gabapentin (NEURONTIN) 100 MG capsule Take 1 capsule (100 mg total) by mouth at bedtime. 30 capsule 1  . ibuprofen (ADVIL,MOTRIN) 200 MG tablet Take 400 mg by mouth daily as needed for mild pain or moderate pain.    . metFORMIN (GLUCOPHAGE) 500 MG tablet Take 1 tablet (500 mg total) by mouth 2 (two) times daily with a meal. 60 tablet 0  . tamoxifen (NOLVADEX) 20 MG tablet Take 1 tablet (20 mg total) by mouth daily. 90 tablet 0  . diphenoxylate-atropine (LOMOTIL) 2.5-0.025 MG tablet Take 1-2 tablets by mouth 4 (four) times daily as needed for diarrhea or loose stools. (Patient  not taking: Reported on 12/05/2017) 30 tablet 1   No current facility-administered medications for this encounter.     Physical Findings: The patient is in no acute distress. Patient is alert and oriented.  height is _0  (1.702 m) and weight is 315 lb 12.8 oz (143.2 kg) (abnormal). Her oral temperature is 98.1 F (36.7 C). Her blood pressure is 140/101 (abnormal) and her pulse is 92. Her oxygen saturation is 100%. .    Satisfactory skin healing in radiotherapy fields. Skin over the right breast is still a little dry centrally. There is some resolving hyperpigmentation. Skin is intact.    Lab Findings: Lab Results  Component Value Date   WBC 5.6 12/05/2017   HGB 11.8 12/05/2017   HCT 36.4 12/05/2017   MCV 84.7 12/05/2017   PLT 279 12/05/2017    Radiographic Findings: No results found.  Impression/Plan: Healing well from radiotherapy to the breast tissue.  Continue skin care with topical Vitamin E Oil for at least 2 more months for further healing.  I encouraged her to continue with yearly mammography and followup with medical oncology. I will see her back on an as-needed basis. I have encouraged her to call if she has any issues or concerns in the future. I wished her the very best.   _____________________________________   [name], MD  This document serves as a  record of services personally performed by [name], MD. It was created on her behalf by Arlyce Harman, a trained medical scribe. The creation of this record is based on the scribe's personal observations and the provider's statements to them. This document has been checked and approved by the attending provider.

## 2018-01-12 ENCOUNTER — Other Ambulatory Visit: Payer: Self-pay | Admitting: Nurse Practitioner

## 2018-01-12 DIAGNOSIS — R7309 Other abnormal glucose: Secondary | ICD-10-CM

## 2018-01-13 NOTE — Progress Notes (Signed)
[facility]  [phone] Fax:([phone]) [phone]  Clinic Follow up Note   Patient Care Team: Lucianne Lei, MD as PCP - General (Family Medicine) Truitt Merle, MD as Consulting Physician (Hematology) [name], MD as Consulting Physician (General Surgery) [name], MD as Attending Physician (Radiation Oncology)   Date of Service:  01/16/2018  CHIEF COMPLAINTS:  Follow up for ductal carcinoma of the right breast   Oncology History   Cancer Staging Malignant neoplasm of upper-inner quadrant of right breast in female, estrogen receptor positive ([place]) Staging form: Breast, AJCC 8th Edition - Clinical stage from 06/26/2017: Stage IIA (cT2, cN0, cM0, G3, ER: Positive, PR: Positive, HER2: Negative) - Signed by Truitt Merle, MD on 07/01/2017 - Pathologic stage from 07/08/2017: Stage IB (pT2, pN0, cM0, G3, ER+, PR+, HER2-, Oncotype DX score: 29) - Signed by [name], NP on 07/25/2017       Malignant neoplasm of upper-inner quadrant of right breast in female, estrogen receptor positive ([place])   06/23/2017 Mammogram    IMPRESSION: 1. There is a highly suspicious mass measuring 3.5 cm mammographically in the right breast at the palpable site identified by the patient. 2. There is 1 suspicious lymph node with cortex measuring up to 6 mm. 3.  No mammographic evidence of malignancy in the left breast.    06/26/2017 Initial Biopsy    Diagnosis 06/26/17 1. Breast, right, needle core biopsy, upper inner quadrant, 12:30 o'clock, 7cm from nipple - INVASIVE DUCTAL CARCINOMA - SEE COMMENT 2. Lymph node, needle/core biopsy, right axillary (level 2 node) - NO CARCINOMA IDENTIFIED IN ONE LYMPH NODE (0/1)    06/26/2017 Receptors her2    Prognostic indicators significant for: ER, 70% positive and PR, 70% positive, both with strong staining intensity. Proliferation marker Ki67 at 60%. HER2 negative.    07/01/2017 Initial Diagnosis    Malignant neoplasm of upper-inner  quadrant of right breast in female, estrogen receptor positive ([place])    07/08/2017 Pathology Results    Diagnosis 1. Breast, lumpectomy, Right - INVASIVE DUCTAL CARCINOMA, NOTTINGHAM GRADE 3 OF 3, 3.5 CM - MARGINS UNINVOLVED BY CARCINOMA (0.1 CM, SUPERIOR MARGIN) - PREVIOUS BIOPSY SITE CHANGES - SEE ONCOLOGY TABLE BELOW 2. Soft tissue, biopsy, Axillary - BENIGN FIBROADIPOSE TISSUE - NO MALIGNANCY IDENTIFIED 3. Lymph node, sentinel, biopsy, Right axillary - NO CARCINOMA IDENTIFIED IN ONE LYMPH NODE (0/1) 4. Lymph node, sentinel, biopsy, Right axillary - NO CARCINOMA IDENTIFIED IN ONE LYMPH NODE (0/1) - PREVIOUS BIOPSY SITE CHANGES - SEE COMMENT 5. Breast, excision, Right superior margin - BENIGN BREAST TISSUE - NO RESIDUAL CARCINOMA IDENTIFIED 6. Breast, excision, Right inferior margin - BENIGN BREAST TISSUE - NO RESIDUAL CARCINOMA IDENTIFIED    07/08/2017 Cancer Staging    Staging form: Breast, AJCC 8th Edition - Pathologic stage from 07/08/2017: Stage IB (pT2, pN0, cM0, G3, ER+, PR+, HER2-, Oncotype DX score: 29) - Signed by [name], NP on 07/25/2017    07/22/2017 Oncotype testing    Recurrence Score: 29 Distant Recurrence Risk at 9 years with Ai or Tamoxifen alone: 18% Absolute Chemotherapy Benefit: > 15 %    08/07/2017 - 10/13/2017 Chemotherapy    Adjuvant chemo Taxotere/Cytoxanq3 weeks x4 cycles with Udenyca -Granix added from cycle 2. Discontinued Udenyca following cycle 2.     11/10/2017 - 12/08/2017 Radiation Therapy    Radiation treatment dates:   11/10/2017 to 12/08/2017  Site/dose:    1. The Right breast was treated to 40.05 Gy in 15 fractions of 2.67  Gy. 2. The Right breast was boosted to 10 Gy in 5 fractions of 2 Gy.  Beams/energy:    1. 3D// 10X 2. Isodose plan w/photons // 10X, 6X  Narrative: The patient tolerated radiation treatment relatively well.   She developed moderate hyperpigmentation to the right breast with treatment. She denies any pain or  fatigue. She continues to use radiaplex twice daily as directed.     12/2017 -  Anti-estrogen oral therapy    started Tamoxifen 12m daily in 12/2017     HISTORY OF PRESENTING ILLNESS:  Caitlin BINGMAN52y.o. female is a here because of newly diagnosed breast cancer. The patient was referred by Dr. CElly Modena The patient presents to the clinic today by herself.  Prior to pt's abnormal mammogram she states she felt the lump which prompted her to get screening 1 month ago. Her last mammogram was in 2008 and was otherwise normal.   Pt's mammogram from 06/23/17 revealed a highly suspicious mass measuring 3.5 cm mammographically in the right breast at the palpable site identified by the patient.There was also a 1 suspicious lymph node with cortex measuring up to 6 Mm. Her biopsy confirmed invasive ductal carcinoma and had reassuring results of no carcinoma identified in the lymph node.   Prognostic indicators significant for: ER, 70% positive and PR, 70% positive, both with strong staining intensity. Proliferation marker Ki67 at 60%. HER2 negative.  GYN HISTORY  Menarchal: xx LMP: xx Contraceptive: not currently, oral birth control in the past  HRT:  GP: 3:3 first at 52years old   Today the patient notes she does have some pain due to biopsy. Pt denies hot flash. She still has a period but it is irregular. Her last period was over 1 month ago.    Pt has a PMHx of Depression for which she use to take medication. She states she is not feeling overwhelmed right now just mildly anxious. She has a past surgical history of cholecystectomy, tubal ligation, right wrist fracture. She reports she previously had part of her cervix removed due to discovery of cancer cells.   Pt does not have any FHx of Cancer that she knows of. Pt denies tobacco use and she drinks alcohol occasionally.   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 All three of her [place] in [place]    CURRENT THERAPY:    Tamoxifen 22mdaily starting 12/2017    INTERVAL HISTORY   Caitlin GUSMANs here for a follow up of her right breast cancer. She presents to the clinic today accompanied by herself. She notes she has been on Tamoxifen for 3 weeks. She denies any significant side effects. Her hot flashes are manageable.  She notes her last period was the February-March before her cancer diagnosis.  She still has her PAC and plans to get this removed soon. She notes her hair is returning slowly for her.  She is currently taking Gabapentin once daily. This does not help with her sleep. She is not able to fall asleep or she wakes up in the middle of the night. She has not tried anything over the counter. She notes her Gabapentin is slowly improving her neuropathy.  She notes stiffness on her joint in the morning.    MEDICAL HISTORY:  Past Medical History:  Diagnosis Date  . Anemia   . Anxiety   [facility]   breast cancer right  . Depression   . GERD (gastroesophageal reflux  disease)   . Headache   . History of radiation therapy 11/10/17- 12/08/17   right breast, 40.05 Gy in 15 fractions. Right breast boost 10 Gy in 5 fractions.     SURGICAL HISTORY: Past Surgical History:  Procedure Laterality Date  . CERVICAL CONE BIOPSY    . CHOLECYSTECTOMY    . MASTECTOMY WITH RADIOACTIVE SEED GUIDED EXCISION AND AXILLARY SENTINEL LYMPH NODE BIOPSY Right 07/08/2017   Procedure: RIGHT LUMPTECTOMY WITH RADIOACTIVE SEED GUIDED EXCISION AND RIGHT AXILLARY SENTINEL LYMPH NODE BIOPSY ERAS PATHWAY;  Surgeon: [name], MD;  Location: [place];  Service: General;  Laterality: Right;  PEC BLOCK  . OPEN REDUCTION INTERNAL FIXATION (ORIF) DISTAL RADIAL FRACTURE  03/17/2012   Procedure: OPEN REDUCTION INTERNAL FIXATION (ORIF) DISTAL RADIAL FRACTURE;  Surgeon: Roseanne Kaufman, MD;  Location: [place];  Service: Orthopedics;  Laterality: Right;  Pre-operative a supra-clavical block right arm in addition to general anesthesia   . PORTACATH PLACEMENT N/A 07/30/2017   Procedure: INSERTION PORT-A-CATH WITH [place];  Surgeon: [name], MD;  Location: [facility];  Service: General;  Laterality: N/A;  . TUBAL LIGATION      SOCIAL HISTORY: Social History   Socioeconomic History  . Marital status: Single    Spouse name: Not on file  . Number of children: Not on file  . Years of education: Not on file  . Highest education level: Not on file  Occupational History  . Not on file  Social Needs  . Financial resource strain: Not on file  . Food insecurity:    Worry: Not on file    Inability: Not on file  . Transportation needs:    Medical: No    Non-medical: No  Tobacco Use  . Smoking status: Never Smoker  . Smokeless tobacco: Never Used  Substance and Sexual Activity  . Alcohol use: Yes    Comment: occassionally  . Drug use: No  . Sexual activity: Yes    Birth control/protection: Surgical  Lifestyle  . Physical activity:    Days per week: 2 days    Minutes per session: 30 min  . Stress: Only a little  Relationships  . Social connections:    Talks on phone: More than three times a week    Gets together: Once a week    Attends religious service: Never    Active member of club or organization: No    Attends meetings of clubs or organizations: Never    Relationship status: Never married  . Intimate partner violence:    Fear of current or ex partner: No    Emotionally abused: No    Physically abused: No    Forced sexual activity: No  Other Topics Concern  . Not on file  Social History Narrative   Lives with roommate   Works fulltime but seasonal work    FAMILY HISTORY: Family History  Problem Relation Age of Onset  . Hypertension Mother   . Diabetes Father   . Hypertension Father     ALLERGIES:  has No Known Allergies.  MEDICATIONS:  Current Outpatient Medications  Medication Sig Dispense Refill  . diphenoxylate-atropine (LOMOTIL) 2.5-0.025 MG tablet Take 1-2 tablets by mouth 4 (four)  times daily as needed for diarrhea or loose stools. 30 tablet 1  . gabapentin (NEURONTIN) 100 MG capsule Take 1 capsule (100 mg total) by mouth at bedtime. 30 capsule 1  . ibuprofen (ADVIL,MOTRIN) 200 MG tablet Take 400 mg by mouth daily as needed for mild pain or  moderate pain.    . metFORMIN (GLUCOPHAGE) 500 MG tablet Take 1 tablet (500 mg total) by mouth 2 (two) times daily with a meal. 60 tablet 0  . tamoxifen (NOLVADEX) 20 MG tablet Take 1 tablet (20 mg total) by mouth daily. 90 tablet 0   Current Facility-Administered Medications  Medication Dose Route Frequency Provider Last Rate Last Dose  . Influenza vac split quadrivalent PF (FLUARIX) injection 0.5 mL  0.5 mL Intramuscular Once Truitt Merle, MD        REVIEW OF SYSTEMS:   Constitutional: Denies fevers, chills or abnormal night sweats (+) hot flashes, manageable (+) weight loss (+) Insomnia (+) Hair growth  Eyes: Denies blurriness of vision, double vision or watery eyes Ears, nose, mouth, throat, and face: Denies mucositis or sore throat Respiratory: Denies cough, dyspnea or wheezes Cardiovascular: Denies palpitation, chest discomfort or lower extremity swelling  Gastrointestinal:  Denies nausea, heartburn or change in bowel habits Skin: Denies abnormal skin rashes Lymphatics: Denies new lymphadenopathy or easy bruising Neurological:Denies new weaknesses (+) neuropathy in feet with mild effects on balance, improving.  MSK: (+) Joint stiffness in the morning Behavioral/Psych: Mood is stable, no new changes   All other systems were reviewed with the patient and are negative.  PHYSICAL EXAMINATION:  ECOG PERFORMANCE STATUS: 1 Vitals:   01/16/18 1100  BP: (!) 154/99  Pulse: 91  Resp: 18  Temp: 98.5 F (36.9 C)  TempSrc: Oral  SpO2: 100%  Weight: (!) 315 lb 8 oz (143.1 kg)  Height: 5' 7"  (1.702 m)    GENERAL:alert, no distress and comfortable SKIN: skin color, texture, turgor are normal, no rashes or significant  lesions EYES: normal, conjunctiva are pink and non-injected, sclera clear OROPHARYNX:no exudate, no erythema and lips, buccal mucosa, and tongue normal  NECK: supple, thyroid normal size, non-tender, without nodularity LYMPH:  no palpable lymphadenopathy in the cervical, axillary or inguinal LUNGS: clear to auscultation and percussion with normal breathing effort HEART: regular rhythm and no murmurs and no lower extremity edema (+) mild tachycardia ABDOMEN:abdomen soft, non-tender and normal bowel sounds Musculoskeletal:no cyanosis of digits and no clubbing  PSYCH: alert & oriented x 3 with fluent speech NEURO: no focal motor/sensory deficits BREAST: (+) S/p Right lumpectomy: Surgical incision healed well (+) Diffuse skin erythema from radiation. (+) Mild right arm lymphedema   LABORATORY DATA:  I have reviewed the data as listed CBC Latest Ref Rng & Units 01/16/2018 12/05/2017 11/07/2017  WBC 3.9 - 10.3 K/uL 7.2 5.6 7.5  Hemoglobin 11.6 - 15.9 g/dL 12.1 11.8 11.1(L)  Hematocrit 34.8 - 46.6 % 37.1 36.4 33.2(L)  Platelets 145 - 400 K/uL 273 279 345    CMP Latest Ref Rng & Units 01/16/2018 12/05/2017 11/07/2017  Glucose 70 - 99 mg/dL 122(H) 119(H) 115(H)  BUN 6 - 20 mg/dL 10 10 14   Creatinine 0.44 - 1.00 mg/dL 0.80 0.80 0.83  Sodium 135 - 145 mmol/L 141 142 140  Potassium 3.5 - 5.1 mmol/L 3.5 3.3(L) 3.5  Chloride 98 - 111 mmol/L 107 107 108  CO2 22 - 32 mmol/L 27 27 24   Calcium 8.9 - 10.3 mg/dL 8.9 9.2 9.3  Total Protein 6.5 - 8.1 g/dL 7.4 7.2 6.9  Total Bilirubin 0.3 - 1.2 mg/dL 0.3 0.4 0.4  Alkaline Phos 38 - 126 U/L 63 69 64  AST 15 - 41 U/L 15 19 26   ALT 0 - 44 U/L 11 18 27    PATHOLOGY  Diagnosis 06/26/17 1. Breast, right, needle core biopsy,  upper inner quadrant, 12:30 o'clock, 7cm from nipple - INVASIVE DUCTAL CARCINOMA - SEE COMMENT 2. Lymph node, needle/core biopsy, right axillary (level 2 node) - NO CARCINOMA IDENTIFIED IN ONE LYMPH NODE (0/1) Results: HER2 -  NEGATIVE RATIO OF HER2/CEP17 SIGNALS 1.21 AVERAGE HER2 COPY NUMBER PER CELL 2.00 Results: IMMUNOHISTOCHEMICAL AND MORPHOMETRIC ANALYSIS PERFORMED MANUALLY Estrogen Receptor: 70%, POSITIVE, STRONG STAINING INTENSITY Progesterone Receptor: 70%, POSITIVE, STRONG STAINING INTENSITY Proliferation Marker Ki67: 60%  RADIOGRAPHIC STUDIES: I have personally reviewed the radiological images as listed and agreed with the findings in the report.  Diagnostic Mammogram 06/23/17 IMPRESSION: 1. There is a highly suspicious mass measuring 3.5 cm mammographically in the right breast at the palpable site identified by the patient. 2. There is 1 suspicious lymph node with cortex measuring up to 6 mm. 3.  No mammographic evidence of malignancy in the left breast.   No results found.  ASSESSMENT & PLAN:  52 y.o. pre-menopausal woman, presented with screening discovered to Ductal carcinoma  1.  Malignant neoplasm of the upper-outer quadrant of right breast, base of ductal carcinoma, pT2N0M0, stage IIA, grade 3, ER+ /PR +, HER2 -, stage IB, Oncotype RS 29 -She previously underwent Right breast lumpectomy and right axillary sentinel lymph node biopsy on 07/08/17. -due to the high risk disease, she received 4 cycles of adjuvant chemo with docetaxel and Cytoxan( TC) with Udenyca 08/08/17-10/13/17.  She overall tolerated moderately well.   -She completed adjuvant radiation with Dr. Isidore Moos 11/10/17-12/08/17. -She started Tamoxifen 64m daily in early 12/2017. Tolerating well with manageable hot flashes.  -She has joint pain for which I encouraged her to exercise and continue working on losing weight. She is agreeable.  -[place] She is interested in screening.  -We also discussed the breast cancer surveillance after her surgery. She will continue annual screening mammogram, self exam, and a routine office visit with lab and exam with [place] -I offered her the option of  attending Survivorship Clinic. She is interested. Will set up.  -She is fine to proceed with PAC removal by Dr. WDonne Hazel I will send a message to Dr. WDonne Hazel -She is clinically doing well. Her physical exam was unremarkable, except mild right arm lymphedema. I recommend she follow up with PT. There is no clinical concern for recurrence.  -Mammogram due in 06/2018 -She opted for flu shot today, will be given.  -F/u in 5 months, sooner if she participate the clinical trial    2. Depression/Anxiety  -She has a hx of depression and was previously treated with medication. Not currently on medication and she feels that her depression is controlled. Mildly anxious about her diagnosis -I previously offered social work services and she agreed -Her main concern is having to feel dependent on others, but she has good social support. I previously contacted our breast RN navigator to help assist her with transportation, she agreed -Mood stable   3. Steroids induced hyperglycemia  -This is likely steroid-induced hyperglycemia.  She is very obese, likely at high risk for diabetes. -I temporarily prescribed her Metformin BID while on chemo.  -I recommend she follow up with her PCP as she may be borderline Diabetic and should start diabetic management.  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  -She has some functional difficulties with dexterity and slightly decreased vibratory  sense to her feet.  -NP Lacie previously prescribed low dose gabapentin 100 mg qHS. She can titrate slowly if needed if she is tolerating well  -She is still on low dose gabapentin. I recommend she increase her Gabapentin to 275m at night and she can titrate up to 1083min the AM and 200-30072mn the evening if needed.    Plan -Refill Metformin, Tamoxifen, and increase  Gabapentin up to 300m26mily  -she will be screened for the NATE[place] trial  -Lab and f/u in 5 months, survivorship clinic in 2 months   -Mammogram in 06/2018 -Flu shot today     No orders of the defined types were placed in this encounter.   All questions were answered. The patient knows to call the clinic with any problems, questions or concerns. I spent 20 minutes counseling the patient face to face. The total time spent in the appointment was 25 minutes and more than 50% was on counseling.  I, AOneal Deputy acting as scribe for Lynnelle Mesmer Truitt Merle.   I have reviewed the above documentation for accuracy and completeness, and I agree with the above.        Caitlin Cohen Truitt Merle 01/16/2018

## 2018-01-16 ENCOUNTER — Inpatient Hospital Stay: Payer: BLUE CROSS/BLUE SHIELD

## 2018-01-16 ENCOUNTER — Inpatient Hospital Stay: Payer: BLUE CROSS/BLUE SHIELD | Attending: Hematology | Admitting: Hematology

## 2018-01-16 VITALS — BP 154/99 | HR 91 | Temp 98.5°F | Resp 18 | Ht 67.0 in | Wt 315.5 lb

## 2018-01-16 DIAGNOSIS — C50211 Malignant neoplasm of upper-inner quadrant of right female breast: Secondary | ICD-10-CM

## 2018-01-16 DIAGNOSIS — R7309 Other abnormal glucose: Secondary | ICD-10-CM | POA: Diagnosis not present

## 2018-01-16 DIAGNOSIS — T451X5A Adverse effect of antineoplastic and immunosuppressive drugs, initial encounter: Secondary | ICD-10-CM | POA: Diagnosis not present

## 2018-01-16 DIAGNOSIS — G620 Drug-induced polyneuropathy: Secondary | ICD-10-CM | POA: Insufficient documentation

## 2018-01-16 DIAGNOSIS — Z23 Encounter for immunization: Secondary | ICD-10-CM

## 2018-01-16 DIAGNOSIS — Z79810 Long term (current) use of selective estrogen receptor modulators (SERMs): Secondary | ICD-10-CM | POA: Insufficient documentation

## 2018-01-16 DIAGNOSIS — Z95828 Presence of other vascular implants and grafts: Secondary | ICD-10-CM

## 2018-01-16 DIAGNOSIS — Z170 Estrogen receptor positive status [ER+]: Principal | ICD-10-CM

## 2018-01-16 DIAGNOSIS — F418 Other specified anxiety disorders: Secondary | ICD-10-CM | POA: Diagnosis not present

## 2018-01-16 LAB — CBC WITH DIFFERENTIAL (CANCER CENTER ONLY)
Basophils Absolute: 0 10*3/uL (ref 0.0–0.1)
Basophils Relative: 0 %
EOS ABS: 0.1 10*3/uL (ref 0.0–0.5)
Eosinophils Relative: 1 %
HCT: 37.1 % (ref 34.8–46.6)
HEMOGLOBIN: 12.1 g/dL (ref 11.6–15.9)
Lymphocytes Relative: 35 %
Lymphs Abs: 2.5 10*3/uL (ref 0.9–3.3)
MCH: 26.8 pg (ref 25.1–34.0)
MCHC: 32.6 g/dL (ref 31.5–36.0)
MCV: 82.3 fL (ref 79.5–101.0)
Monocytes Absolute: 0.3 10*3/uL (ref 0.1–0.9)
Monocytes Relative: 4 %
NEUTROS ABS: 4.3 10*3/uL (ref 1.5–6.5)
NEUTROS PCT: 60 %
Platelet Count: 273 10*3/uL (ref 145–400)
RBC: 4.51 MIL/uL (ref 3.70–5.45)
RDW: 14.4 % (ref 11.2–14.5)
WBC: 7.2 10*3/uL (ref 3.9–10.3)

## 2018-01-16 LAB — CMP (CANCER CENTER ONLY)
ALK PHOS: 63 U/L (ref 38–126)
ALT: 11 U/L (ref 0–44)
ANION GAP: 7 (ref 5–15)
AST: 15 U/L (ref 15–41)
Albumin: 3.5 g/dL (ref 3.5–5.0)
BUN: 10 mg/dL (ref 6–20)
CALCIUM: 8.9 mg/dL (ref 8.9–10.3)
CO2: 27 mmol/L (ref 22–32)
Chloride: 107 mmol/L (ref 98–111)
Creatinine: 0.8 mg/dL (ref 0.44–1.00)
GFR, Est AFR Am: >60 mL/min (ref >60)
GFR, Estimated: >60 mL/min (ref >60)
Glucose, Bld: 122 mg/dL — ABNORMAL HIGH (ref 70–99)
Potassium: 3.5 mmol/L (ref 3.5–5.1)
Sodium: 141 mmol/L (ref 135–145)
Total Bilirubin: 0.3 mg/dL (ref 0.3–1.2)
Total Protein: 7.4 g/dL (ref 6.5–8.1)

## 2018-01-16 MED ORDER — GABAPENTIN 100 MG PO CAPS: 300.0000 mg | ORAL_CAPSULE | Freq: Every day | ORAL | 1 refills | Status: DC

## 2018-01-16 MED ORDER — TAMOXIFEN CITRATE 20 MG PO TABS: 20.0000 mg | ORAL_TABLET | Freq: Every day | ORAL | 2 refills | Status: DC

## 2018-01-16 MED ORDER — INFLUENZA VAC SPLIT QUAD 0.5 ML IM SUSY: 0.5000 mL | PREFILLED_SYRINGE | Freq: Once | INTRAMUSCULAR | Status: DC

## 2018-01-16 MED ORDER — SODIUM CHLORIDE 0.9% FLUSH
10.0000 mL | INTRAVENOUS | Status: DC | PRN
  Administered 2018-01-16: 10 mL
  Filled 2018-01-16: qty 10

## 2018-01-16 MED ORDER — METFORMIN HCL 500 MG PO TABS: 500.0000 mg | ORAL_TABLET | Freq: Two times a day (BID) | ORAL | 0 refills | Status: DC

## 2018-01-16 MED ORDER — HEPARIN SOD (PORK) LOCK FLUSH 100 UNIT/ML IV SOLN
500.0000 [IU] | Freq: Once | INTRAVENOUS | Status: AC | PRN
  Administered 2018-01-16: 500 [IU]
  Filled 2018-01-16: qty 5

## 2018-01-17 ENCOUNTER — Encounter: Payer: Self-pay | Admitting: Hematology

## 2018-01-19 ENCOUNTER — Telehealth: Payer: Self-pay | Admitting: Hematology

## 2018-01-19 NOTE — Telephone Encounter (Signed)
Appts scheduled letter/calendar mailed per 10/5 los

## 2018-01-22 ENCOUNTER — Other Ambulatory Visit: Payer: Self-pay | Admitting: General Surgery

## 2018-01-23 ENCOUNTER — Encounter (HOSPITAL_BASED_OUTPATIENT_CLINIC_OR_DEPARTMENT_OTHER): Payer: Self-pay | Admitting: *Deleted

## 2018-01-23 ENCOUNTER — Other Ambulatory Visit: Payer: Self-pay

## 2018-01-29 ENCOUNTER — Encounter (HOSPITAL_BASED_OUTPATIENT_CLINIC_OR_DEPARTMENT_OTHER)
Admission: RE | Admit: 2018-01-29 | Discharge: 2018-01-29 | Disposition: A | Discharge status: Home / Self Care | Payer: BLUE CROSS/BLUE SHIELD | Source: Ambulatory Visit | Attending: General Surgery | Admitting: General Surgery

## 2018-01-29 MED ORDER — ENSURE PRE-SURGERY PO LIQD: 296.0000 mL | Freq: Once | ORAL | Status: DC

## 2018-01-29 NOTE — Consult Note (Signed)
Anesthesia Consult: Pt presents for anesthesia consult d/t BMI 49 prior to port-a-cath removal at [facility]. Pt has history of breast cancer and diabetes. No cardiopulmonary disease. No hx of OSA. Previously was an easy mask ventilation, MIL 2 grade 1 view on 07/08/17 and LMA #4 on 07/30/17. On exam she has good mouth opening with full cervical ROM, TM>6 cm and Mallampati II. No contraindication to proceeding with [facility] at [facility].  Lidia Collum, MD  01/29/18 1:14 PM

## 2018-01-29 NOTE — Progress Notes (Signed)
Pt in for Anesthesia consult. Given pre op ensure and instructions reviewed. Ok per Dr Christella Hartigan for opt day surgery 02/02/18.

## 2018-02-02 ENCOUNTER — Encounter (HOSPITAL_BASED_OUTPATIENT_CLINIC_OR_DEPARTMENT_OTHER)
Admission: RE | Disposition: A | Discharge status: Home / Self Care | Payer: Self-pay | Source: Ambulatory Visit | Attending: General Surgery

## 2018-02-02 ENCOUNTER — Encounter (HOSPITAL_BASED_OUTPATIENT_CLINIC_OR_DEPARTMENT_OTHER): Payer: Self-pay | Admitting: Anesthesiology

## 2018-02-02 ENCOUNTER — Ambulatory Visit (HOSPITAL_BASED_OUTPATIENT_CLINIC_OR_DEPARTMENT_OTHER): Payer: BLUE CROSS/BLUE SHIELD | Admitting: Anesthesiology

## 2018-02-02 ENCOUNTER — Ambulatory Visit (HOSPITAL_BASED_OUTPATIENT_CLINIC_OR_DEPARTMENT_OTHER)
Admission: RE | Admit: 2018-02-02 | Discharge: 2018-02-02 | Disposition: A | Discharge status: Home / Self Care | Payer: BLUE CROSS/BLUE SHIELD | Source: Ambulatory Visit | Attending: General Surgery | Admitting: General Surgery

## 2018-02-02 ENCOUNTER — Other Ambulatory Visit: Payer: Self-pay

## 2018-02-02 DIAGNOSIS — Z7984 Long term (current) use of oral hypoglycemic drugs: Secondary | ICD-10-CM | POA: Insufficient documentation

## 2018-02-02 DIAGNOSIS — Z452 Encounter for adjustment and management of vascular access device: Secondary | ICD-10-CM | POA: Insufficient documentation

## 2018-02-02 DIAGNOSIS — Z79899 Other long term (current) drug therapy: Secondary | ICD-10-CM | POA: Insufficient documentation

## 2018-02-02 DIAGNOSIS — Z923 Personal history of irradiation: Secondary | ICD-10-CM | POA: Diagnosis not present

## 2018-02-02 DIAGNOSIS — Z79810 Long term (current) use of selective estrogen receptor modulators (SERMs): Secondary | ICD-10-CM | POA: Insufficient documentation

## 2018-02-02 DIAGNOSIS — Z853 Personal history of malignant neoplasm of breast: Secondary | ICD-10-CM | POA: Diagnosis not present

## 2018-02-02 HISTORY — PX: PORT-A-CATH REMOVAL: SHX5289

## 2018-02-02 SURGERY — REMOVAL PORT-A-CATH
Anesthesia: Monitor Anesthesia Care | Site: Chest | Laterality: Right

## 2018-02-02 MED ORDER — FENTANYL CITRATE (PF) 100 MCG/2ML IJ SOLN
INTRAMUSCULAR | Status: DC | PRN
  Administered 2018-02-02: 50 ug via INTRAVENOUS

## 2018-02-02 MED ORDER — FENTANYL CITRATE (PF) 100 MCG/2ML IJ SOLN
INTRAMUSCULAR | Status: AC
  Filled 2018-02-02: qty 2

## 2018-02-02 MED ORDER — GLYCOPYRROLATE PF 0.2 MG/ML IJ SOSY
PREFILLED_SYRINGE | INTRAMUSCULAR | Status: AC
  Filled 2018-02-02: qty 1

## 2018-02-02 MED ORDER — ACETAMINOPHEN 500 MG PO TABS
1000.0000 mg | ORAL_TABLET | ORAL | Status: AC
  Administered 2018-02-02: 1000 mg via ORAL

## 2018-02-02 MED ORDER — FENTANYL CITRATE (PF) 100 MCG/2ML IJ SOLN: 50.0000 ug | INTRAMUSCULAR | Status: DC | PRN

## 2018-02-02 MED ORDER — GABAPENTIN 100 MG PO CAPS
100.0000 mg | ORAL_CAPSULE | ORAL | Status: AC
  Administered 2018-02-02: 100 mg via ORAL

## 2018-02-02 MED ORDER — MIDAZOLAM HCL 2 MG/2ML IJ SOLN: 1.0000 mg | INTRAMUSCULAR | Status: DC | PRN

## 2018-02-02 MED ORDER — PROPOFOL 500 MG/50ML IV EMUL
INTRAVENOUS | Status: AC
  Filled 2018-02-02: qty 50

## 2018-02-02 MED ORDER — SCOPOLAMINE 1 MG/3DAYS TD PT72: 1.0000 | MEDICATED_PATCH | Freq: Once | TRANSDERMAL | Status: DC | PRN

## 2018-02-02 MED ORDER — ACETAMINOPHEN 500 MG PO TABS
ORAL_TABLET | ORAL | Status: AC
  Filled 2018-02-02: qty 2

## 2018-02-02 MED ORDER — FENTANYL CITRATE (PF) 100 MCG/2ML IJ SOLN: 25.0000 ug | INTRAMUSCULAR | Status: DC | PRN

## 2018-02-02 MED ORDER — LIDOCAINE 2% (20 MG/ML) 5 ML SYRINGE
INTRAMUSCULAR | Status: AC
  Filled 2018-02-02: qty 5

## 2018-02-02 MED ORDER — MIDAZOLAM HCL 2 MG/2ML IJ SOLN
INTRAMUSCULAR | Status: AC
  Filled 2018-02-02: qty 2

## 2018-02-02 MED ORDER — ONDANSETRON HCL 4 MG/2ML IJ SOLN
INTRAMUSCULAR | Status: AC
  Filled 2018-02-02: qty 2

## 2018-02-02 MED ORDER — PROPOFOL 500 MG/50ML IV EMUL: INTRAVENOUS | Status: DC | PRN

## 2018-02-02 MED ORDER — MIDAZOLAM HCL 5 MG/5ML IJ SOLN
INTRAMUSCULAR | Status: DC | PRN
  Administered 2018-02-02: 2 mg via INTRAVENOUS

## 2018-02-02 MED ORDER — GABAPENTIN 100 MG PO CAPS
ORAL_CAPSULE | ORAL | Status: AC
  Filled 2018-02-02: qty 1

## 2018-02-02 MED ORDER — DEXAMETHASONE SODIUM PHOSPHATE 10 MG/ML IJ SOLN
INTRAMUSCULAR | Status: AC
  Filled 2018-02-02: qty 1

## 2018-02-02 MED ORDER — PROPOFOL 10 MG/ML IV BOLUS
INTRAVENOUS | Status: DC | PRN
  Administered 2018-02-02: 20 mg via INTRAVENOUS
  Administered 2018-02-02: 30 mg via INTRAVENOUS

## 2018-02-02 MED ORDER — LACTATED RINGERS IV SOLN
INTRAVENOUS | Status: DC
  Administered 2018-02-02: 08:00:00 via INTRAVENOUS

## 2018-02-02 MED ORDER — ONDANSETRON HCL 4 MG/2ML IJ SOLN
INTRAMUSCULAR | Status: DC | PRN
  Administered 2018-02-02: 4 mg via INTRAVENOUS

## 2018-02-02 MED ORDER — BUPIVACAINE HCL (PF) 0.25 % IJ SOLN
INTRAMUSCULAR | Status: DC | PRN
  Administered 2018-02-02: 6 mL

## 2018-02-02 SURGICAL SUPPLY — 34 items
ADH SKN CLS APL DERMABOND .7 (GAUZE/BANDAGES/DRESSINGS) ×1
BLADE SURG 15 STRL LF DISP TIS (BLADE) ×1 IMPLANT
BLADE SURG 15 STRL SS (BLADE) ×3
CHLORAPREP W/TINT 26ML (MISCELLANEOUS) ×3 IMPLANT
COVER BACK TABLE 60X90IN (DRAPES) ×3 IMPLANT
COVER MAYO STAND STRL (DRAPES) ×3 IMPLANT
COVER WAND RF STERILE (DRAPES) IMPLANT
DECANTER SPIKE VIAL GLASS SM (MISCELLANEOUS) ×3 IMPLANT
DERMABOND ADVANCED (GAUZE/BANDAGES/DRESSINGS) ×2
DERMABOND ADVANCED .7 DNX12 (GAUZE/BANDAGES/DRESSINGS) ×1 IMPLANT
DRAPE LAPAROTOMY 100X72 PEDS (DRAPES) ×3 IMPLANT
DRAPE UTILITY XL STRL (DRAPES) ×3 IMPLANT
ELECT COATED BLADE 2.86 ST (ELECTRODE) ×3 IMPLANT
ELECT REM PT RETURN 9FT ADLT (ELECTROSURGICAL) ×3
ELECTRODE REM PT RTRN 9FT ADLT (ELECTROSURGICAL) ×1 IMPLANT
GLOVE BIO SURGEON STRL SZ 6.5 (GLOVE) ×1 IMPLANT
GLOVE BIO SURGEON STRL SZ7 (GLOVE) ×3 IMPLANT
GLOVE BIO SURGEONS STRL SZ 6.5 (GLOVE) ×1
GLOVE BIOGEL PI IND STRL 7.0 (GLOVE) IMPLANT
GLOVE BIOGEL PI IND STRL 7.5 (GLOVE) ×1 IMPLANT
GLOVE BIOGEL PI INDICATOR 7.0 (GLOVE) ×2
GLOVE BIOGEL PI INDICATOR 7.5 (GLOVE) ×2
GOWN STRL REUS W/ TWL LRG LVL3 (GOWN DISPOSABLE) ×2 IMPLANT
GOWN STRL REUS W/TWL LRG LVL3 (GOWN DISPOSABLE) ×6
NDL HYPO 25X1 1.5 SAFETY (NEEDLE) ×1 IMPLANT
NEEDLE HYPO 25X1 1.5 SAFETY (NEEDLE) ×3 IMPLANT
PACK BASIN DAY SURGERY FS (CUSTOM PROCEDURE TRAY) ×3 IMPLANT
PENCIL BUTTON HOLSTER BLD 10FT (ELECTRODE) ×3 IMPLANT
SLEEVE SCD COMPRESS KNEE MED (MISCELLANEOUS) IMPLANT
SUT MNCRL AB 4-0 PS2 18 (SUTURE) ×3 IMPLANT
SUT VIC AB 3-0 SH 27 (SUTURE) ×3
SUT VIC AB 3-0 SH 27X BRD (SUTURE) ×1 IMPLANT
SYR CONTROL 10ML LL (SYRINGE) ×3 IMPLANT
TOWEL OR NON WOVEN STRL DISP B (DISPOSABLE) ×3 IMPLANT

## 2018-02-02 NOTE — Transfer of Care (Signed)
Immediate Anesthesia Transfer of Care Note  Patient: Illene Labrador  Procedure(s) Performed: REMOVAL PORT-A-CATH (Right Chest)  Patient Location: PACU  Anesthesia Type:MAC  Level of Consciousness: awake and patient cooperative  Airway & Oxygen Therapy: Patient Spontanous Breathing and Patient connected to face mask oxygen  Post-op Assessment: Report given to RN and Post -op Vital signs reviewed and stable  Post vital signs: Reviewed and stable  Last Vitals:  Vitals Value Taken Time  BP    Temp    Pulse 78 02/02/2018  9:29 AM  Resp    SpO2 99 % 02/02/2018  9:29 AM  Vitals shown include unvalidated device data.  Last Pain:  Vitals:   02/02/18 0734  TempSrc: Oral  PainSc:          Complications: No apparent anesthesia complications

## 2018-02-02 NOTE — Interval H&P Note (Signed)
History and Physical Interval Note:  02/02/2018 8:54 AM  Caitlin Cohen  has presented today for surgery, with the diagnosis of Breast cancer  The various methods of treatment have been discussed with the patient and family. After consideration of risks, benefits and other options for treatment, the patient has consented to  Procedure(s): REMOVAL PORT-A-CATH (N/A) as a surgical intervention .  The patient's history has been reviewed, patient examined, no change in status, stable for surgery.  I have reviewed the patient's chart and labs.  Questions were answered to the patient's satisfaction.     [name]

## 2018-02-02 NOTE — Op Note (Signed)
Preoperative diagnosis: breast cancer s/p treatment Postoperative diagnosis: Same as above Procedure: right ij port removal Surgeon: Dr. Serita Grammes Anesthesia: local mac Estimated blood loss: Less than 30 cc Specimens:none Complications: None Drains: None Sponge needle count was correct at completion Disposition to recovery in stable condition  Indications: This is a 70 yof who has completed breast cancer treatment and presents for port removal.   Procedure: After informed consent was obtained the patient first underwent a pectoral block. She was then placed under MAC without complication. She was prepped and draped in the standard sterile surgical fashion. A surgical timeout was then performed.  I infiltrated marcaine at site of old port incision. I reentered incision and removed port in its entirety. There was nor more foreign body.  Hemostasis was observed.  The remaining tissue was closed with 3-0 Vicryl and 4-0 Monocryl. Glue and Steri-Strips were applied as well. She was transferred to pacu stable.

## 2018-02-02 NOTE — Anesthesia Preprocedure Evaluation (Addendum)
Anesthesia Evaluation  Patient identified by MRN, date of birth, ID band Patient awake    Reviewed: Allergy & Precautions, NPO status , Patient's Chart, lab work & pertinent test results  Airway Mallampati: II  TM Distance: >3 FB Neck ROM: Full    Dental no notable dental hx. (+) Teeth Intact, Dental Advisory Given   Pulmonary neg pulmonary ROS,    Pulmonary exam normal breath sounds clear to auscultation       Cardiovascular negative cardio ROS Normal cardiovascular exam Rhythm:Regular Rate:Normal     Neuro/Psych  Headaches, PSYCHIATRIC DISORDERS Anxiety Depression    GI/Hepatic Neg liver ROS, GERD  ,  Endo/Other  diabetes, Type 2, Oral Hypoglycemic Agents  Renal/GU negative Renal ROS  negative genitourinary   Musculoskeletal negative musculoskeletal ROS (+)   Abdominal   Peds  Hematology  (+) Blood dyscrasia, anemia ,   Anesthesia Other Findings Right breast cancer s/p port placement  Reproductive/Obstetrics                           Anesthesia Physical Anesthesia Plan  ASA: II  Anesthesia Plan: MAC   Post-op Pain Management:    Induction: Intravenous  PONV Risk Score and Plan: 2 and Propofol infusion, Treatment may vary due to age or medical condition and Midazolam  Airway Management Planned: Natural Airway and Simple Face Mask  Additional Equipment:   Intra-op Plan:   Post-operative Plan:   Informed Consent: I have reviewed the patients History and Physical, chart, labs and discussed the procedure including the risks, benefits and alternatives for the proposed anesthesia with the patient or authorized representative who has indicated his/her understanding and acceptance.   Dental advisory given  Plan Discussed with: CRNA  Anesthesia Plan Comments:        Anesthesia Quick Evaluation

## 2018-02-02 NOTE — Anesthesia Postprocedure Evaluation (Signed)
Anesthesia Post Note  Patient: Caitlin Cohen  Procedure(s) Performed: REMOVAL PORT-A-CATH (Right Chest)     Patient location during evaluation: PACU Anesthesia Type: MAC Level of consciousness: awake and alert Pain management: pain level controlled Vital Signs Assessment: post-procedure vital signs reviewed and stable Respiratory status: spontaneous breathing, nonlabored ventilation, respiratory function stable and patient connected to nasal cannula oxygen Cardiovascular status: stable and blood pressure returned to baseline Postop Assessment: no apparent nausea or vomiting Anesthetic complications: no    Last Vitals:  Vitals:   02/02/18 0944 02/02/18 0959  BP: (!) 149/87 (!) 142/92  Pulse: 72 75  Resp: 16 18  Temp:  36.6 C  SpO2: 99% 100%    Last Pain:  Vitals:   02/02/18 0959  TempSrc:   PainSc: 0-No pain                 Audrionna Lampton L Romulus Hanrahan

## 2018-02-02 NOTE — Discharge Instructions (Signed)
PORT-A-CATH: POST OP INSTRUCTIONS  Always review your discharge instruction sheet given to you by the facility where your surgery was performed.   1. A prescription for pain medication may be given to you upon discharge. Take your pain medication as prescribed, if needed. If narcotic pain medicine is not needed, then you make take acetaminophen (Tylenol) or ibuprofen (Advil) as needed.  2. Take your usually prescribed medications unless otherwise directed. 3. If you need a refill on your pain medication, please contact our office. All narcotic pain medicine now requires a paper prescription.  Phoned in and fax refills are no longer allowed by law.  Prescriptions will not be filled after 5 pm or on weekends.  4. You should follow a light diet for the remainder of the day after your procedure. 5. Most patients will experience some mild swelling and/or bruising in the area of the incision. It may take several days to resolve. 6. It is common to experience some constipation if taking pain medication after surgery. Increasing fluid intake and taking a stool softener (such as Colace) will usually help or prevent this problem from occurring. A mild laxative (Milk of Magnesia or Miralax) should be taken according to package directions if there are no bowel movements after 48 hours.  7. Unless discharge instructions indicate otherwise, you may remove your bandages 48 hours after surgery, and you may shower at that time. You may have steri-strips (small white skin tapes) in place directly over the incision.  These strips should be left on the skin for 7-10 days.  If your surgeon used Dermabond (skin glue) on the incision, you may shower in 24 hours.  The glue will flake off over the next 2-3 weeks.  8. ACTIVITIES:may return to normal activity as tolerated 9.You may need to see your doctor in the office for a follow-up appointment.  Please       check with your doctor.    WHEN TO CALL YOUR DOCTOR  [phone]): 1. Fever over 101.0 2. Chills 3. Continued bleeding from incision 4. Increased redness and tenderness at the site 5. Shortness of breath, difficulty breathing   The clinic staff is available to answer your questions during regular business hours. Please dont hesitate to call and ask to speak to one of the nurses or medical assistants for clinical concerns. If you have a medical emergency, go to the nearest emergency room or call 911.  A surgeon from [facility] is always on call at the hospital.     For further information, please visit [email or web address]     Post Anesthesia Home Care Instructions  Activity: Get plenty of rest for the remainder of the day. A responsible individual must stay with you for 24 hours following the procedure.  For the next 24 hours, DO NOT: -Drive a car -Paediatric nurse -Drink alcoholic beverages -Take any medication unless instructed by your physician -Make any legal decisions or sign important papers.  Meals: Start with liquid foods such as gelatin or soup. Progress to regular foods as tolerated. Avoid greasy, spicy, heavy foods. If nausea and/or vomiting occur, drink only clear liquids until the nausea and/or vomiting subsides. Call your physician if vomiting continues.  Special Instructions/Symptoms: Your throat may feel dry or sore from the anesthesia or the breathing tube placed in your throat during surgery. If this causes discomfort, gargle with warm salt water. The discomfort should disappear within 24 hours.  If you had a scopolamine patch placed behind your ear  for the management of post- operative nausea and/or vomiting:  1. The medication in the patch is effective for 72 hours, after which it should be removed.  Wrap patch in a tissue and discard in the trash. Wash hands thoroughly with soap and water. 2. You may remove the patch earlier than 72 hours if you experience unpleasant side effects  which may include dry mouth, dizziness or visual disturbances. 3. Avoid touching the patch. Wash your hands with soap and water after contact with the patch.

## 2018-02-02 NOTE — H&P (Signed)
Caitlin Cohen is an 52 y.o. female.   Chief Complaint: breast cancer HPI:52 yof s/p treatment for breast cancer, has done well, now for port removal.   Past Medical History:  Diagnosis Date  . Anemia   . Anxiety   . Cancer [facility])    breast cancer right  . Depression   . GERD (gastroesophageal reflux disease)   . Headache   . History of radiation therapy 11/10/17- 12/08/17   right breast, 40.05 Gy in 15 fractions. Right breast boost 10 Gy in 5 fractions.     Past Surgical History:  Procedure Laterality Date  . CERVICAL CONE BIOPSY    . CHOLECYSTECTOMY    . MASTECTOMY WITH RADIOACTIVE SEED GUIDED EXCISION AND AXILLARY SENTINEL LYMPH NODE BIOPSY Right 07/08/2017   Procedure: RIGHT LUMPTECTOMY WITH RADIOACTIVE SEED GUIDED EXCISION AND RIGHT AXILLARY SENTINEL LYMPH NODE BIOPSY ERAS PATHWAY;  Surgeon: [name], MD;  Location: [place];  Service: General;  Laterality: Right;  PEC BLOCK  . OPEN REDUCTION INTERNAL FIXATION (ORIF) DISTAL RADIAL FRACTURE  03/17/2012   Procedure: OPEN REDUCTION INTERNAL FIXATION (ORIF) DISTAL RADIAL FRACTURE;  Surgeon: Roseanne Kaufman, MD;  Location: [place];  Service: Orthopedics;  Laterality: Right;  Pre-operative a supra-clavical block right arm in addition to general anesthesia  . PORTACATH PLACEMENT N/A 07/30/2017   Procedure: INSERTION PORT-A-CATH WITH [place];  Surgeon: [name], MD;  Location: [place];  Service: General;  Laterality: N/A;  . TUBAL LIGATION      Family History  Problem Relation Age of Onset  . Hypertension Mother   . Diabetes Father   . Hypertension Father    Social History:  reports that she has never smoked. She has never used smokeless tobacco. She reports that she drinks alcohol. She reports that she does not use drugs.  Allergies: No Known Allergies  Medications Prior to Admission  Medication Sig Dispense Refill  . gabapentin (NEURONTIN) 100 MG capsule Take 3 capsules (300 mg total) by mouth at bedtime. 90 capsule 1  .  ibuprofen (ADVIL,MOTRIN) 200 MG tablet Take 400 mg by mouth daily as needed for mild pain or moderate pain.    . metFORMIN (GLUCOPHAGE) 500 MG tablet Take 1 tablet (500 mg total) by mouth 2 (two) times daily with a meal. 60 tablet 0  . tamoxifen (NOLVADEX) 20 MG tablet Take 1 tablet (20 mg total) by mouth daily. 90 tablet 2    No results found for this or any previous visit (from the past 48 hour(s)). No results found.  Review of Systems  All other systems reviewed and are negative.   Blood pressure (!) 141/87, pulse (!) 106, temperature 98.2 F (36.8 C), temperature source Oral, resp. rate 18, height 5\' 7"  (1.702 m), weight (!) 141.2 kg, SpO2 98 %. Physical Exam  cv rrr Lungs clear Right ij port in place Assessment/Plan Breast cancer Port removal  [name], MD 02/02/2018, 8:51 AM

## 2018-02-03 ENCOUNTER — Encounter (HOSPITAL_BASED_OUTPATIENT_CLINIC_OR_DEPARTMENT_OTHER): Payer: Self-pay | Admitting: General Surgery

## 2018-02-08 ENCOUNTER — Other Ambulatory Visit: Payer: Self-pay | Admitting: Hematology

## 2018-02-08 DIAGNOSIS — R7309 Other abnormal glucose: Secondary | ICD-10-CM

## 2018-02-20 ENCOUNTER — Other Ambulatory Visit: Payer: Self-pay | Admitting: Hematology

## 2018-02-20 DIAGNOSIS — G620 Drug-induced polyneuropathy: Secondary | ICD-10-CM

## 2018-02-20 DIAGNOSIS — T451X5A Adverse effect of antineoplastic and immunosuppressive drugs, initial encounter: Principal | ICD-10-CM

## 2018-02-23 ENCOUNTER — Other Ambulatory Visit: Payer: Self-pay

## 2018-02-23 ENCOUNTER — Other Ambulatory Visit: Payer: Self-pay | Admitting: Hematology

## 2018-02-23 DIAGNOSIS — R7309 Other abnormal glucose: Secondary | ICD-10-CM

## 2018-02-23 MED ORDER — METFORMIN HCL 500 MG PO TABS: 500.0000 mg | ORAL_TABLET | Freq: Two times a day (BID) | ORAL | 1 refills | Status: DC

## 2018-04-16 ENCOUNTER — Telehealth: Payer: Self-pay | Admitting: Hematology

## 2018-04-16 NOTE — Telephone Encounter (Signed)
Called to cancel will see Lacie upon return in April

## 2018-04-23 ENCOUNTER — Telehealth: Payer: Self-pay

## 2018-04-23 NOTE — Telephone Encounter (Signed)
I called her back. She denies any skin erythema, edema, tenderness, or palpable mass, the pain was close to her previous breast surgical site.  I reassured her this is likely related to her previous surgery, and her pain has much improved today.  I will cancel her appointment next week, she will keep her appointment in March, and will call us if she has worsening or new symptoms.  Truitt Merle MD

## 2018-04-23 NOTE — Telephone Encounter (Signed)
Spoke with patient concerning right breast pain, she states that yesterday it was almost the entire day, she experienced sharp shooting pain, not painful to touch, very concerned, offered patient appointment for Wednesday 1/15 at 1:45, she desires to come in.

## 2018-04-23 NOTE — Telephone Encounter (Signed)
Patient call with complaint of sharp shooting pains in the right breast in the area where she had surgery.  Wants to know if she should be concerned and/or if she needs to be seen.

## 2018-04-29 ENCOUNTER — Ambulatory Visit: Payer: Self-pay | Admitting: Hematology

## 2018-04-29 ENCOUNTER — Other Ambulatory Visit: Payer: Self-pay

## 2018-05-05 ENCOUNTER — Encounter: Payer: Self-pay | Admitting: Adult Health

## 2018-05-05 ENCOUNTER — Telehealth: Payer: Self-pay | Admitting: Hematology

## 2018-05-05 ENCOUNTER — Telehealth: Payer: Self-pay

## 2018-05-05 NOTE — Telephone Encounter (Signed)
I called patient back, regarding her back pain.  She never had a similar pain or back problem in the past, she is able to walk and function, but she does have moderate pain with walking, sitting, and even resting.  She has tried heating pad, and over-the-counter Tylenol, ibuprofen etc.  She recently lost her primary care physician, I recommend her to go to urgent care for evaluation first, and call me back in 2 to 3 weeks, if her pain does not improve then I will see her back in my clinic. She agree with the plan.  For her leg cramps, I encouraged her to drink fluids adequately, and take over-the-counter calcium, vitamin D, and magnesium.  She agrees.  Truitt Merle  05/05/2018

## 2018-05-05 NOTE — Telephone Encounter (Signed)
Patient calls with complaint of right lower back pain radiating down right leg for 3 weeks.  She has been taking Ibuprofen, using heat and ice, rest not helping.    She also is having leg cramps at night.  Wants Dr. Ernestina Penna advice.  Dr. Burr Medico made aware.

## 2018-05-06 ENCOUNTER — Encounter (HOSPITAL_COMMUNITY): Payer: Self-pay

## 2018-05-06 ENCOUNTER — Ambulatory Visit (HOSPITAL_COMMUNITY)
Admission: EM | Admit: 2018-05-06 | Discharge: 2018-05-06 | Disposition: A | Discharge status: Home / Self Care | Payer: BLUE CROSS/BLUE SHIELD | Attending: Family Medicine | Admitting: Family Medicine

## 2018-05-06 ENCOUNTER — Ambulatory Visit (INDEPENDENT_AMBULATORY_CARE_PROVIDER_SITE_OTHER): Payer: BLUE CROSS/BLUE SHIELD

## 2018-05-06 DIAGNOSIS — M545 Low back pain, unspecified: Secondary | ICD-10-CM

## 2018-05-06 DIAGNOSIS — Z853 Personal history of malignant neoplasm of breast: Secondary | ICD-10-CM | POA: Diagnosis not present

## 2018-05-06 DIAGNOSIS — Z923 Personal history of irradiation: Secondary | ICD-10-CM

## 2018-05-06 NOTE — ED Triage Notes (Signed)
Pt presents with back pain on the lower right side.

## 2018-05-06 NOTE — ED Provider Notes (Signed)
[place]    CSN: [id number] Arrival date & time: 05/06/18  1805     History   Chief Complaint Chief Complaint  Patient presents with  . Back Pain    HPI Caitlin Cohen is a 53 y.o. female.   Patient has a 3-week history of right-sided back pain.  There is no radiation.  No history of any injury lifting or bending.  She has a history of breast cancer and has had radiation and chemo.  She spoke to her oncologist who recommended she have lumbar spine x-ray and she is here for that reason.  HPI  Past Medical History:  Diagnosis Date  . Anemia   . Anxiety   . Cancer [facility])    breast cancer right  . Depression   . GERD (gastroesophageal reflux disease)   . Headache   . History of radiation therapy 11/10/17- 12/08/17   right breast, 40.05 Gy in 15 fractions. Right breast boost 10 Gy in 5 fractions.     Patient Active Problem List   Diagnosis Date Noted  . DM (diabetes mellitus), type 2 ([place]) 09/28/2017  . Febrile neutropenia ([place]) 09/26/2017  . Sepsis ([place]) 09/26/2017  . Diarrhea 09/26/2017  . Port-A-Cath in place 08/29/2017  . Malignant neoplasm of upper-inner quadrant of right breast in female, estrogen receptor positive ([place]) 07/01/2017  . Mood disorder ([place]) 07/05/2013    Class: Acute    Past Surgical History:  Procedure Laterality Date  . CERVICAL CONE BIOPSY    . CHOLECYSTECTOMY    . MASTECTOMY WITH RADIOACTIVE SEED GUIDED EXCISION AND AXILLARY SENTINEL LYMPH NODE BIOPSY Right 07/08/2017   Procedure: RIGHT LUMPTECTOMY WITH RADIOACTIVE SEED GUIDED EXCISION AND RIGHT AXILLARY SENTINEL LYMPH NODE BIOPSY ERAS PATHWAY;  Surgeon: [name], MD;  Location: [place];  Service: General;  Laterality: Right;  PEC BLOCK  . OPEN REDUCTION INTERNAL FIXATION (ORIF) DISTAL RADIAL FRACTURE  03/17/2012   Procedure: OPEN REDUCTION INTERNAL FIXATION (ORIF) DISTAL RADIAL FRACTURE;  Surgeon: Roseanne Kaufman, MD;  Location: [place];  Service: Orthopedics;  Laterality: Right;   Pre-operative a supra-clavical block right arm in addition to general anesthesia  . PORT-A-CATH REMOVAL Right 02/02/2018   Procedure: REMOVAL PORT-A-CATH;  Surgeon: [name], MD;  Location: [place];  Service: General;  Laterality: Right;  . PORTACATH PLACEMENT N/A 07/30/2017   Procedure: INSERTION PORT-A-CATH WITH [place];  Surgeon: [name], MD;  Location: [place];  Service: General;  Laterality: N/A;  . TUBAL LIGATION      OB History    Gravida  3   Para      Term      Preterm      AB      Living  3     SAB      TAB      Ectopic      Multiple      Live Births  3            Home Medications    Prior to Admission medications   Medication Sig Start Date End Date Taking? Authorizing Provider  gabapentin (NEURONTIN) 100 MG capsule TAKE 3 CAPSULES (300 MG TOTAL) BY MOUTH AT BEDTIME. 02/20/18   Truitt Merle, MD  ibuprofen (ADVIL,MOTRIN) 200 MG tablet Take 400 mg by mouth daily as needed for mild pain or moderate pain.    [provider]  metFORMIN (GLUCOPHAGE) 500 MG tablet Take 1 tablet (500 mg total) by mouth 2 (two) times daily  with a meal. 02/23/18   Truitt Merle, MD  tamoxifen (NOLVADEX) 20 MG tablet Take 1 tablet (20 mg total) by mouth daily. 01/16/18   Truitt Merle, MD    Family History Family History  Problem Relation Age of Onset  . Hypertension Mother   . Diabetes Father   . Hypertension Father     Social History Social History   Tobacco Use  . Smoking status: Never Smoker  . Smokeless tobacco: Never Used  Substance Use Topics  . Alcohol use: Yes    Comment: occassionally  . Drug use: No     Allergies   Patient has no known allergies.   Review of Systems Review of Systems  Constitutional: Negative.   Respiratory: Negative.   Cardiovascular: Negative.   Musculoskeletal: Positive for back pain.  All other systems reviewed and are negative.    Physical Exam Triage Vital Signs ED Triage Vitals [05/06/18  1854]  Enc Vitals Group     BP (!) 146/96     Pulse Rate 86     Resp 18     Temp 98.2 F (36.8 C)     Temp Source Oral     SpO2 99 %     Weight      Height      Head Circumference      Peak Flow      Pain Score 8     Pain Loc      Pain Edu?      Excl. in [place]?    No data found.  Updated Vital Signs BP (!) 146/96 (BP Location: Left Arm)   Pulse 86   Temp 98.2 F (36.8 C) (Oral)   Resp 18   SpO2 99%   Visual Acuity Right Eye Distance:   Left Eye Distance:   Bilateral Distance:    Right Eye Near:   Left Eye Near:    Bilateral Near:     Physical Exam Constitutional:      Appearance: Normal appearance. She is obese.  Cardiovascular:     Rate and Rhythm: Normal rate and regular rhythm.     Heart sounds: Normal heart sounds.  Pulmonary:     Effort: Pulmonary effort is normal.     Breath sounds: Normal breath sounds.  Musculoskeletal:     Comments: Back: Normal range of motion Straight leg raising negative Deep tendon reflexes are symmetric  Neurological:     Mental Status: She is alert.      UC Treatments / Results  Labs (all labs ordered are listed, but only abnormal results are displayed) Labs Reviewed - No data to display  EKG None  Radiology No results found.  Procedures Procedures (including critical care time)  Medications Ordered in UC Medications - No data to display  Initial Impression / Assessment and Plan / UC Course  I have reviewed the triage vital signs and the nursing notes.  Pertinent labs & imaging results that were available during my care of the patient were reviewed by me and considered in my medical decision making (see chart for details).    Low back pain.  On x-ray shows some disc space narrowing at two upper lumbar vertebrae.  Have recommended follow-up with orthopedist and continue 800 mg ibuprofen in the meantime Final Clinical Impressions(s) / UC Diagnoses   Final diagnoses:  None   Discharge Instructions   None     ED Prescriptions    None     Controlled Substance Prescriptions [place]  Controlled Substance Registry consulted? No   Wardell Honour, MD 05/06/18 2013

## 2018-05-26 ENCOUNTER — Emergency Department (HOSPITAL_COMMUNITY)
Admission: EM | Admit: 2018-05-26 | Discharge: 2018-05-27 | Disposition: A | Discharge status: Home / Self Care | Payer: BLUE CROSS/BLUE SHIELD | Attending: Emergency Medicine | Admitting: Emergency Medicine

## 2018-05-26 ENCOUNTER — Encounter (HOSPITAL_COMMUNITY): Payer: Self-pay

## 2018-05-26 DIAGNOSIS — R509 Fever, unspecified: Secondary | ICD-10-CM | POA: Insufficient documentation

## 2018-05-26 DIAGNOSIS — Z5321 Procedure and treatment not carried out due to patient leaving prior to being seen by health care provider: Secondary | ICD-10-CM | POA: Insufficient documentation

## 2018-05-26 NOTE — ED Triage Notes (Signed)
Pt arrived with complaints of chills, fever that started today. Pt finished chemo in July. States she feels like she may have the flu.

## 2018-05-27 NOTE — ED Notes (Signed)
Called for recheck of V/S No answer x1 

## 2018-06-08 ENCOUNTER — Encounter (HOSPITAL_COMMUNITY): Payer: Self-pay | Admitting: *Deleted

## 2018-06-17 NOTE — Progress Notes (Signed)
[name]   [phone] [phone]   Clinic Follow up Note   Patient Care Team: Lucianne Lei, MD as PCP - General (Family Medicine) Truitt Merle, MD as Consulting Physician (Hematology) [name], MD as Consulting Physician (General Surgery) [name], MD as Attending Physician (Radiation Oncology)  Date of Service:  06/19/2018  CHIEF COMPLAINT: Follow up of right breast cancer   SUMMARY OF ONCOLOGIC HISTORY: Oncology History   Cancer Staging Malignant neoplasm of upper-inner quadrant of right breast in female, estrogen receptor positive ([place]) Staging form: Breast, AJCC 8th Edition - Clinical stage from 06/26/2017: Stage IIA (cT2, cN0, cM0, G3, ER: Positive, PR: Positive, HER2: Negative) - Signed by Truitt Merle, MD on 07/01/2017 - Pathologic stage from 07/08/2017: Stage IB (pT2, pN0, cM0, G3, ER+, PR+, HER2-, Oncotype DX score: 29) - Signed by [name], NP on 07/25/2017       Malignant neoplasm of upper-inner quadrant of right breast in female, estrogen receptor positive ([place])   06/23/2017 Mammogram    IMPRESSION: 1. There is a highly suspicious mass measuring 3.5 cm mammographically in the right breast at the palpable site identified by the patient. 2. There is 1 suspicious lymph node with cortex measuring up to 6 mm. 3.  No mammographic evidence of malignancy in the left breast.    06/26/2017 Initial Biopsy    Diagnosis 06/26/17 1. Breast, right, needle core biopsy, upper inner quadrant, 12:30 o'clock, 7cm from nipple - INVASIVE DUCTAL CARCINOMA - SEE COMMENT 2. Lymph node, needle/core biopsy, right axillary (level 2 node) - NO CARCINOMA IDENTIFIED IN ONE LYMPH NODE (0/1)    06/26/2017 Receptors her2    Prognostic indicators significant for: ER, 70% positive and PR, 70% positive, both with strong staining intensity. Proliferation marker Ki67 at 60%. HER2 negative.    07/01/2017 Initial Diagnosis    Malignant neoplasm of  upper-inner quadrant of right breast in female, estrogen receptor positive ([place])    07/08/2017 Pathology Results    Diagnosis 1. Breast, lumpectomy, Right - INVASIVE DUCTAL CARCINOMA, [place] GRADE 3 OF 3, 3.5 CM - MARGINS UNINVOLVED BY CARCINOMA (0.1 CM, SUPERIOR MARGIN) - PREVIOUS BIOPSY SITE CHANGES - SEE ONCOLOGY TABLE BELOW 2. Soft tissue, biopsy, Axillary - BENIGN FIBROADIPOSE TISSUE - NO MALIGNANCY IDENTIFIED 3. Lymph node, sentinel, biopsy, Right axillary - NO CARCINOMA IDENTIFIED IN ONE LYMPH NODE (0/1) 4. Lymph node, sentinel, biopsy, Right axillary - NO CARCINOMA IDENTIFIED IN ONE LYMPH NODE (0/1) - PREVIOUS BIOPSY SITE CHANGES - SEE COMMENT 5. Breast, excision, Right superior margin - BENIGN BREAST TISSUE - NO RESIDUAL CARCINOMA IDENTIFIED 6. Breast, excision, Right inferior margin - BENIGN BREAST TISSUE - NO RESIDUAL CARCINOMA IDENTIFIED    07/08/2017 Cancer Staging    Staging form: Breast, AJCC 8th Edition - Pathologic stage from 07/08/2017: Stage IB (pT2, pN0, cM0, G3, ER+, PR+, HER2-, Oncotype DX score: 29) - Signed by [name], NP on 07/25/2017    07/22/2017 Oncotype testing    Recurrence Score: 29 Distant Recurrence Risk at 9 years with Ai or Tamoxifen alone: 18% Absolute Chemotherapy Benefit: > 15 %    08/07/2017 - 10/13/2017 Chemotherapy    Adjuvant chemo Taxotere/Cytoxanq3 weeks x4 cycles with Udenyca -Granix added from cycle 2. Discontinued Udenyca following cycle 2.     11/10/2017 - 12/08/2017 Radiation Therapy    Radiation treatment dates:   11/10/2017 to 12/08/2017  Site/dose:    1. The Right breast was treated to 40.05 Gy in 15 fractions of  2.67 Gy. 2. The Right breast was boosted to 10 Gy in 5 fractions of 2 Gy.  Beams/energy:    1. 3D// 10X 2. Isodose plan w/photons // 10X, 6X  Narrative: The patient tolerated radiation treatment relatively well.   She developed moderate hyperpigmentation to the right breast with treatment. She denies any  pain or fatigue. She continues to use radiaplex twice daily as directed.     12/2017 -  Anti-estrogen oral therapy    started Tamoxifen 88m daily in 12/2017      CURRENT THERAPY:  Tamoxifen 265mdaily starting 12/2017  INTERVAL HISTORY:  Caitlin SHARMAs here for a follow up of right breast cancer. She was last seen by me 5 months ago. She presents to the clinic today by herself. She notes she is doing well. She started Tamoxifen and has been tolerating well with manageable hot flashes. She feels achiness and joint pain after chemo in her knees, neck, back, hips. She is still able to do regular activity. She notes she is not trying to gain back the weight she lost. Her last period remains in 05/2017. She notes she had tubal ligation years ago and does not plan to have anymore children.     REVIEW OF SYSTEMS:   Constitutional: Denies fevers, chills or abnormal weight loss (+) Manageable hot flashes  Eyes: Denies blurriness of vision Ears, nose, mouth, throat, and face: Denies mucositis or sore throat Respiratory: Denies cough, dyspnea or wheezes Cardiovascular: Denies palpitation, chest discomfort or lower extremity swelling Gastrointestinal:  Denies nausea, heartburn or change in bowel habits Skin: Denies abnormal skin rashes MSK: (+) diffuse joint pain  Lymphatics: Denies new lymphadenopathy or easy bruising Neurological:Denies numbness, tingling or new weaknesses Behavioral/Psych: Mood is stable, no new changes  All other systems were reviewed with the patient and are negative.  MEDICAL HISTORY:  Past Medical History:  Diagnosis Date  . Anemia   . Anxiety   [facility]   bre[facility] cancer right  . Depression   . GERD (gastroesophageal reflux disease)   . Headache   . History of radiation therapy 11/10/17- 12/08/17   right breast, 40.05 Gy in 15 fractions. Right breast boost 10 Gy in 5 fractions.     SURGICAL HISTORY: Past Surgical History:  Procedure Laterality Date  .  CERVICAL CONE BIOPSY    . CHOLECYSTECTOMY    . MASTECTOMY WITH RADIOACTIVE SEED GUIDED EXCISION AND AXILLARY SENTINEL LYMPH NODE BIOPSY Right 07/08/2017   Procedure: RIGHT LUMPTECTOMY WITH RADIOACTIVE SEED GUIDED EXCISION AND RIGHT AXILLARY SENTINEL LYMPH NODE BIOPSY ERAS PATHWAY;  Surgeon: WaRolm BookbinderMD;  [place] [facility] Service[facility] General;  Laterality: Right;  PEC BLOCK  . OPEN REDUCTION INTERNAL FIXATION (ORIF) DISTAL RADIAL FRACTURE  03/17/2012   Procedure: OPEN REDUCTION INTERNAL FIXATION (ORIF) DISTAL RADIAL FRACTURE;  Surgeon: WiRoseanne KaufmanMD;  [place] [facility] Service: Orthopedics;  Laterality: Right;  Pre-operative a supra-clavical block right arm in addition to general anesthesia  . PORT-A-CATH REMOVAL Right 02/02/2018   Procedure: REMOVAL PORT-A-CATH;  Surgeon: WaRolm BookbinderMD;  [place];  Laterality: Right;  . PORTACATH PLACEMENT N/A 07/30/2017   Procedure: INSERTION PORT-A-CATH WITH [place] Surgeon: WaRolm BookbinderMD;  [place] [facility] Service: General;  Laterality: N/A;  . TUBAL LIGATION      I have reviewed the social history and family history with the patient and they are unchanged from previous note.  ALLERGIES:  has No Known Allergies.  MEDICATIONS:  Current Outpatient Medications  Medication Sig Dispense Refill  . gabapentin (NEURONTIN) 100 MG capsule TAKE 3 CAPSULES (300 MG TOTAL) BY MOUTH AT BEDTIME. 270 capsule 1  . ibuprofen (ADVIL,MOTRIN) 200 MG tablet Take 400 mg by mouth daily as needed for mild pain or moderate pain.    . metFORMIN (GLUCOPHAGE) 500 MG tablet Take 1 tablet (500 mg total) by mouth 2 (two) times daily with a meal. 180 tablet 1  . tamoxifen (NOLVADEX) 20 MG tablet Take 1 tablet (20 mg total) by mouth daily. 90 tablet 3   No current facility-administered medications for this visit.     PHYSICAL EXAMINATION: ECOG PERFORMANCE STATUS: 1 - Symptomatic but completely ambulatory  Vitals:    06/19/18 1011  BP: 136/88  Pulse: 92  Resp: 20  Temp: 98.9 F (37.2 C)  SpO2: 99%   Filed Weights   06/19/18 1011  Weight: (!) 309 lb 12.8 oz (140.5 kg)    GENERAL:alert, no distress and comfortable SKIN: skin color, texture, turgor are normal, no rashes or significant lesions EYES: normal, Conjunctiva are pink and non-injected, sclera clear OROPHARYNX:no exudate, no erythema and lips, buccal mucosa, and tongue normal  NECK: supple, thyroid normal size, non-tender, without nodularity LYMPH:  no palpable lymphadenopathy in the cervical, axillary or inguinal LUNGS: clear to auscultation and percussion with normal breathing effort HEART: regular rate & rhythm and no murmurs and no lower extremity edema ABDOMEN:abdomen soft, non-tender and normal bowel sounds Musculoskeletal:no cyanosis of digits and no clubbing  NEURO: alert & oriented x 3 with fluent speech, no focal motor/sensory deficits BREAST: S/p right breast lumpectomy: Surgical incision healed well with scar tissue and skin hyperpigmentation (+) moderate right breast lymphedema (+) No palpable mass of either breasts   LABORATORY DATA:  I have reviewed the data as listed CBC Latest Ref Rng & Units 06/19/2018 01/16/2018 12/05/2017  WBC 4.0 - 10.5 K/uL 6.4 7.2 5.6  Hemoglobin 12.0 - 15.0 g/dL 11.7(L) 12.1 11.8  Hematocrit 36.0 - 46.0 % 36.8 37.1 36.4  Platelets 150 - 400 K/uL 240 273 279     CMP Latest Ref Rng & Units 06/19/2018 01/16/2018 12/05/2017  Glucose 70 - 99 mg/dL 92 122(H) 119(H)  BUN 6 - 20 mg/dL 16 10 10   Creatinine 0.44 - 1.00 mg/dL 0.92 0.80 0.80  Sodium 135 - 145 mmol/L 141 141 142  Potassium 3.5 - 5.1 mmol/L 3.5 3.5 3.3(L)  Chloride 98 - 111 mmol/L 108 107 107  CO2 22 - 32 mmol/L 26 27 27   Calcium 8.9 - 10.3 mg/dL 8.9 8.9 9.2  Total Protein 6.5 - 8.1 g/dL 7.2 7.4 7.2  Total Bilirubin 0.3 - 1.2 mg/dL 0.6 0.3 0.4  Alkaline Phos 38 - 126 U/L 57 63 69  AST 15 - 41 U/L 16 15 19   ALT 0 - 44 U/L 12 11 18        RADIOGRAPHIC STUDIES: I have personally reviewed the radiological images as listed and agreed with the findings in the report. No results found.   ASSESSMENT & PLAN:  MERISA JULIO is a 53 y.o. female with   1.  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 She is s/p right breast lumpectomy, adjuvant TC and adjuvant radiation.  -She started anti-estrogen therapy with Tamoxifen in 12/2017. Tolerating well with manageable  hot flashes.  -I discussed with Tamoxifen can lead to weight gain, increased risk of blood clots and increase in BG. I strongly encouraged her to maintain active, monitor weight and eat healthy. This will also help her general joint pain.  -She has not had a period since 05/2017. Will test her [place] level at next visit. If she is post-menopausal, will discuss switching her to AI.  -She is clinically doing well. Lab reviewed, her CBC WNL except Hg at 11.7 and CMP WNL Her physical exam was unremarkable except moderate right breast lymphedema. There is no clinical concern for recurrence. -I recommend she do PT to help reduce her right breast lymphedema and help with her joint pain. She is interested.  -Due for mammogram. Will get it scheduled in 2 weeks  -Continue Tamoxifen, refilled today  -F/u in 4 months   2. Depression/Anxiety  -She has a hx of depression and was previously treated with medication. Not currently on medication and she feels that her depression is controlled. -Mood remains stable   3.  Arthralgia -She developed diffuse joint achiness after chemotherapy, does not limit her activities, no need pain meds. -I encouraged her to be physically active, and exercise, and try to lose some weight.   Plan Continue Tamoxifen, refilled today  Lab and f/u in 4 months Mammogram at [facility] in 2 weeks PT referral     No problem-specific Assessment & Plan notes found for this encounter.   Orders Placed This Encounter  Procedures  . MM DIAG BREAST TOMO BILATERAL    Standing Status:   Future    Standing Expiration Date:   06/19/2019    Order Specific Question:   Reason for Exam (SYMPTOM  OR DIAGNOSIS REQUIRED)    Answer:   screening    Order Specific Question:   Is the patient pregnant?    Answer:   No    Order Specific Question:   Preferred imaging location?    Answer:   [facility]  . Ambulatory referral to Physical Therapy    Referral Priority:   Routine    Referral Type:   Physical Medicine    Referral Reason:   Specialty Services Required    Requested Specialty:   Physical Therapy    Number of Visits Requested:   1   All questions were answered. The patient knows to call the clinic with any problems, questions or concerns. No barriers to learning was detected. I spent 15 minutes counseling the patient face to face. The total time spent in the appointment was 20 minutes and more than 50% was on counseling and review of test results     Truitt Merle, MD 06/19/2018   I, Joslyn Devon, am acting as scribe for Truitt Merle, MD.   I have reviewed the above documentation for accuracy and completeness, and I agree with the above.

## 2018-06-19 ENCOUNTER — Other Ambulatory Visit: Payer: Self-pay

## 2018-06-19 ENCOUNTER — Inpatient Hospital Stay: Payer: BLUE CROSS/BLUE SHIELD | Attending: Hematology

## 2018-06-19 ENCOUNTER — Inpatient Hospital Stay (HOSPITAL_BASED_OUTPATIENT_CLINIC_OR_DEPARTMENT_OTHER): Payer: BLUE CROSS/BLUE SHIELD | Admitting: Hematology

## 2018-06-19 ENCOUNTER — Telehealth: Payer: Self-pay | Admitting: Hematology

## 2018-06-19 ENCOUNTER — Encounter: Payer: Self-pay | Admitting: Hematology

## 2018-06-19 VITALS — BP 136/88 | HR 92 | Temp 98.9°F | Resp 20 | Ht 67.0 in | Wt 309.8 lb

## 2018-06-19 DIAGNOSIS — M25551 Pain in right hip: Secondary | ICD-10-CM | POA: Diagnosis not present

## 2018-06-19 DIAGNOSIS — I890 Lymphedema, not elsewhere classified: Secondary | ICD-10-CM

## 2018-06-19 DIAGNOSIS — F329 Major depressive disorder, single episode, unspecified: Secondary | ICD-10-CM | POA: Diagnosis not present

## 2018-06-19 DIAGNOSIS — Z923 Personal history of irradiation: Secondary | ICD-10-CM | POA: Diagnosis not present

## 2018-06-19 DIAGNOSIS — C50211 Malignant neoplasm of upper-inner quadrant of right female breast: Secondary | ICD-10-CM | POA: Insufficient documentation

## 2018-06-19 DIAGNOSIS — M25561 Pain in right knee: Secondary | ICD-10-CM | POA: Insufficient documentation

## 2018-06-19 DIAGNOSIS — T451X5A Adverse effect of antineoplastic and immunosuppressive drugs, initial encounter: Principal | ICD-10-CM

## 2018-06-19 DIAGNOSIS — M25552 Pain in left hip: Secondary | ICD-10-CM

## 2018-06-19 DIAGNOSIS — M25562 Pain in left knee: Secondary | ICD-10-CM | POA: Insufficient documentation

## 2018-06-19 DIAGNOSIS — Z170 Estrogen receptor positive status [ER+]: Secondary | ICD-10-CM | POA: Diagnosis not present

## 2018-06-19 DIAGNOSIS — Z79810 Long term (current) use of selective estrogen receptor modulators (SERMs): Secondary | ICD-10-CM | POA: Insufficient documentation

## 2018-06-19 DIAGNOSIS — R7309 Other abnormal glucose: Secondary | ICD-10-CM

## 2018-06-19 DIAGNOSIS — G620 Drug-induced polyneuropathy: Secondary | ICD-10-CM

## 2018-06-19 LAB — CBC WITH DIFFERENTIAL (CANCER CENTER ONLY)
ABS IMMATURE GRANULOCYTES: 0.01 10*3/uL (ref 0.00–0.07)
BASOS PCT: 0 %
Basophils Absolute: 0 10*3/uL (ref 0.0–0.1)
EOS ABS: 0.1 10*3/uL (ref 0.0–0.5)
Eosinophils Relative: 1 %
HCT: 36.8 % (ref 36.0–46.0)
Hemoglobin: 11.7 g/dL — ABNORMAL LOW (ref 12.0–15.0)
Immature Granulocytes: 0 %
Lymphocytes Relative: 36 %
Lymphs Abs: 2.3 10*3/uL (ref 0.7–4.0)
MCH: 26.8 pg (ref 26.0–34.0)
MCHC: 31.8 g/dL (ref 30.0–36.0)
MCV: 84.2 fL (ref 80.0–100.0)
MONO ABS: 0.2 10*3/uL (ref 0.1–1.0)
MONOS PCT: 3 %
Neutro Abs: 3.8 10*3/uL (ref 1.7–7.7)
Neutrophils Relative %: 60 %
PLATELETS: 240 10*3/uL (ref 150–400)
RBC: 4.37 MIL/uL (ref 3.87–5.11)
RDW: 14.3 % (ref 11.5–15.5)
WBC Count: 6.4 10*3/uL (ref 4.0–10.5)
nRBC: 0 % (ref 0.0–0.2)

## 2018-06-19 LAB — COMPREHENSIVE METABOLIC PANEL
ALT: 12 U/L (ref 0–44)
AST: 16 U/L (ref 15–41)
Albumin: 3.8 g/dL (ref 3.5–5.0)
Alkaline Phosphatase: 57 U/L (ref 38–126)
Anion gap: 7 (ref 5–15)
BILIRUBIN TOTAL: 0.6 mg/dL (ref 0.3–1.2)
BUN: 16 mg/dL (ref 6–20)
CHLORIDE: 108 mmol/L (ref 98–111)
CO2: 26 mmol/L (ref 22–32)
CREATININE: 0.92 mg/dL (ref 0.44–1.00)
Calcium: 8.9 mg/dL (ref 8.9–10.3)
GFR calc Af Amer: >60 mL/min (ref >60)
GFR calc non Af Amer: >60 mL/min (ref >60)
Glucose, Bld: 92 mg/dL (ref 70–99)
Potassium: 3.5 mmol/L (ref 3.5–5.1)
Sodium: 141 mmol/L (ref 135–145)
Total Protein: 7.2 g/dL (ref 6.5–8.1)

## 2018-06-19 MED ORDER — GABAPENTIN 100 MG PO CAPS: 300.0000 mg | ORAL_CAPSULE | Freq: Every day | ORAL | 1 refills | Status: DC

## 2018-06-19 MED ORDER — METFORMIN HCL 500 MG PO TABS: 500.0000 mg | ORAL_TABLET | Freq: Two times a day (BID) | ORAL | 1 refills | Status: DC

## 2018-06-19 MED ORDER — TAMOXIFEN CITRATE 20 MG PO TABS: 20.0000 mg | ORAL_TABLET | Freq: Every day | ORAL | 3 refills | Status: DC

## 2018-06-19 NOTE — Telephone Encounter (Signed)
Scheduled appt per 3/6 los.  Printed calendar and avs.

## 2018-07-03 ENCOUNTER — Ambulatory Visit
Admission: RE | Admit: 2018-07-03 | Discharge: 2018-07-03 | Disposition: A | Discharge status: Home / Self Care | Payer: BLUE CROSS/BLUE SHIELD | Source: Ambulatory Visit | Attending: Hematology | Admitting: Hematology

## 2018-07-03 ENCOUNTER — Other Ambulatory Visit: Payer: Self-pay

## 2018-07-03 DIAGNOSIS — C50211 Malignant neoplasm of upper-inner quadrant of right female breast: Secondary | ICD-10-CM

## 2018-07-03 DIAGNOSIS — Z170 Estrogen receptor positive status [ER+]: Principal | ICD-10-CM

## 2018-07-03 HISTORY — DX: Personal history of antineoplastic chemotherapy: Z92.21

## 2018-07-03 HISTORY — DX: Personal history of irradiation: Z92.3

## 2018-07-14 ENCOUNTER — Ambulatory Visit: Payer: BLUE CROSS/BLUE SHIELD | Admitting: Physical Therapy

## 2018-07-14 ENCOUNTER — Telehealth: Payer: Self-pay | Admitting: Physical Therapy

## 2018-07-14 NOTE — Telephone Encounter (Signed)
Called to talk with patient about her referral to PT to treat breast lymphedema and deconditioning. Explained how we treat lymphedema with MLD, compression and exercise.  She does not have a compression bra but suggested she could use a sports bra for now.  Also talked about exercise and encouraged her to do glute sets, heel raises and walking for several minutes in her house each hour.  Talked to her about the possibility of coming in this or next week to learn breast MLD and see options for compression bras versus  possiblity for telehealth visits versus coming in for a episode of visits once we reopen.  She said that she thinks she is OK for now since none of these are new issues for her, but she took our phone number and will call to schedule a visit if she changes her mind.   She was appreciative of the call. Maudry Diego, PT 07/14/2018 @ 2:58 PM

## 2018-07-31 ENCOUNTER — Ambulatory Visit: Payer: Self-pay | Admitting: Physical Therapy

## 2018-07-31 ENCOUNTER — Telehealth: Payer: Self-pay | Admitting: Hematology

## 2018-07-31 NOTE — Telephone Encounter (Signed)
Left message re rescheduling January SCP visit via webex 4/28.

## 2018-08-04 NOTE — Progress Notes (Addendum)
I connected with Caitlin Cohen on 08/11/18 at 9:49 AM EDT by Webex and verified that I am speaking with the correct person using two identifiers. Despite using WebEx program I was not able to visualize the patient. I discussed the limitations, risks, security and privacy concerns of performing an evaluation and management service by telephone and the availability of in person appointments. The patient expressed understanding and agreed to proceed.   Patient location: home  Provider location: office  Reason for visit: Survivorship care plan   Patient Care Team: Caitlin Lei, MD as PCP - General (Family Medicine) Caitlin Merle, MD as Consulting Physician (Hematology) Caitlin Bookbinder, MD as Consulting Physician (General Surgery) Caitlin Gibson, MD as Attending Physician (Radiation Oncology) Caitlin Feeling, NP as Nurse Practitioner (Nurse Practitioner)  BRIEF ONCOLOGIC HISTORY:  Oncology History   Cancer Staging Malignant neoplasm of upper-inner quadrant of right breast in female, estrogen receptor positive ([place]) Staging form: Breast, AJCC 8th Edition - Clinical stage from 06/26/2017: Stage IIA (cT2, cN0, cM0, G3, ER: Positive, PR: Positive, HER2: Negative) - Signed by Caitlin Merle, MD on 07/01/2017 - Pathologic stage from 07/08/2017: Stage IB (pT2, pN0, cM0, G3, ER+, PR+, HER2-, Oncotype DX score: 29) - Signed by Caitlin Feeling, NP on 07/25/2017       Malignant neoplasm of upper-inner quadrant of right breast in female, estrogen receptor positive ([place])   06/23/2017 Mammogram    IMPRESSION: 1. There is a highly suspicious mass measuring 3.5 cm mammographically in the right breast at the palpable site identified by the patient. 2. There is 1 suspicious lymph node with cortex measuring up to 6 mm. 3.  No mammographic evidence of malignancy in the left breast.    06/26/2017 Initial Biopsy    Diagnosis 06/26/17 1. Breast, right, needle core biopsy, upper inner quadrant, 12:30 o'clock, 7cm from  nipple - INVASIVE DUCTAL CARCINOMA - SEE COMMENT 2. Lymph node, needle/core biopsy, right axillary (level 2 node) - NO CARCINOMA IDENTIFIED IN ONE LYMPH NODE (0/1)    06/26/2017 Receptors her2    Prognostic indicators significant for: ER, 70% positive and PR, 70% positive, both with strong staining intensity. Proliferation marker Ki67 at 60%. HER2 negative.    07/01/2017 Initial Diagnosis    Malignant neoplasm of upper-inner quadrant of right breast in female, estrogen receptor positive ([place])    07/08/2017 Pathology Results    Diagnosis 1. Breast, lumpectomy, Right - INVASIVE DUCTAL CARCINOMA, [place]HAM GRADE 3 OF 3, 3.5 CM - MARGINS UNINVOLVED BY CARCINOMA (0.1 CM, SUPERIOR MARGIN) - PREVIOUS BIOPSY SITE CHANGES - SEE ONCOLOGY TABLE BELOW 2. Soft tissue, biopsy, Axillary - BENIGN FIBROADIPOSE TISSUE - NO MALIGNANCY IDENTIFIED 3. Lymph node, sentinel, biopsy, Right axillary - NO CARCINOMA IDENTIFIED IN ONE LYMPH NODE (0/1) 4. Lymph node, sentinel, biopsy, Right axillary - NO CARCINOMA IDENTIFIED IN ONE LYMPH NODE (0/1) - PREVIOUS BIOPSY SITE CHANGES - SEE COMMENT 5. Breast, excision, Right superior margin - BENIGN BREAST TISSUE - NO RESIDUAL CARCINOMA IDENTIFIED 6. Breast, excision, Right inferior margin - BENIGN BREAST TISSUE - NO RESIDUAL CARCINOMA IDENTIFIED    07/08/2017 Cancer Staging    Staging form: Breast, AJCC 8th Edition - Pathologic stage from 07/08/2017: Stage IB (pT2, pN0, cM0, G3, ER+, PR+, HER2-, Oncotype DX score: 29) - Signed by Caitlin Feeling, NP on 07/25/2017    07/22/2017 Oncotype testing    Recurrence Score: 29 Distant Recurrence Risk at 9 years with Ai or Tamoxifen alone: 18% Absolute Chemotherapy Benefit: > 15 %  08/07/2017 - 10/13/2017 Chemotherapy    Adjuvant chemo Taxotere/Cytoxanq3 weeks x4 cycles with Udenyca -Granix added from cycle 2. Discontinued Udenyca following cycle 2.     11/10/2017 - 12/08/2017 Radiation Therapy    Radiation treatment  dates:   11/10/2017 to 12/08/2017  Site/dose:    1. The Right breast was treated to 40.05 Gy in 15 fractions of 2.67 Gy. 2. The Right breast was boosted to 10 Gy in 5 fractions of 2 Gy.  Beams/energy:    1. 3D// 10X 2. Isodose plan w/photons // 10X, 6X  Narrative: The patient tolerated radiation treatment relatively well.   She developed moderate hyperpigmentation to the right breast with treatment. She denies any pain or fatigue. She continues to use radiaplex twice daily as directed.     12/2017 -  Anti-estrogen oral therapy    started Tamoxifen 48m daily in 12/2017    08/11/2018 Survivorship    SCP virtual visit per LCira Rue NP      INTERVAL HISTORY:  [name] for virtual/phone/webex visit to review her survivorship care plan detailing her treatment course for breast cancer, as well as monitoring long-term side effects of that treatment, education regarding health maintenance, screening, and overall wellness and health promotion.     Ms. HSerdawas last seen in the office 1 month ago by Dr. FBurr Medico Overall, she reports Cohen well since completing her radiation therapy approximately 8 months ago. She has minor right breast swelling. She noticed some discomfort and pain on the left side that radiated from ribs to breast, but the pain subsided. She continues tamoxifen daily. She has hot flashes that fluctuate in severity but are overall getting worse. At times she overheats and becomes nauseous. Appetite is low on these occasions. She notes joint pain in her hips and neck, discomfort is at times 8/10. She was referred to PT which is on hold due to covid19. Energy level is low; she is working from home, not able to get out much. Mood fluctuates with "some good days and some bad."  Neuropathy has improved more in fingers than toes.     REVIEW OF SYSTEMS:  Review of Systems  Constitutional: Positive for appetite change and fatigue. Negative for fever and unexpected weight change.   HENT:   Negative for mouth sores and sore throat.   Eyes: Negative for eye problems.  Respiratory: Negative for cough, shortness of breath and wheezing.   Cardiovascular: Negative for chest pain, leg swelling and palpitations.  Gastrointestinal: Positive for nausea. Negative for blood in stool, constipation and diarrhea.  Endocrine: Positive for hot flashes.  Genitourinary: Negative for vaginal bleeding.   Musculoskeletal: Positive for arthralgias.  Skin: Negative for rash.  Neurological: Positive for numbness.   Breast: Denies any new nodularity, masses, tenderness, nipple changes, or nipple discharge.    ONCOLOGY TREATMENT TEAM:  1. [facility] 2. Medical Oncologist: Dr. FBurr Medico 3. Radiation Oncologist: Dr. SIsidore Moos   PAST MEDICAL/SURGICAL HISTORY:  Past Medical History:  Diagnosis Date  . Anemia   . Anxiety   . Cancer ([facility]    breast cancer right  . Depression   . GERD (gastroesophageal reflux disease)   . Headache   . History of radiation therapy 11/10/17- 12/08/17   right breast, 40.05 Gy in 15 fractions. Right breast boost 10 Gy in 5 fractions.   . Personal history of chemotherapy   . Personal history of radiation therapy    Past Surgical  History:  Procedure Laterality Date  . CERVICAL CONE BIOPSY    . CHOLECYSTECTOMY    . MASTECTOMY WITH RADIOACTIVE SEED GUIDED EXCISION AND AXILLARY SENTINEL LYMPH NODE BIOPSY Right 07/08/2017   Procedure: RIGHT LUMPTECTOMY WITH RADIOACTIVE SEED GUIDED EXCISION AND RIGHT AXILLARY SENTINEL LYMPH NODE BIOPSY ERAS PATHWAY;  Surgeon: Caitlin Bookbinder, MD;  Location: [place];  Service: General;  Laterality: Right;  PEC BLOCK  . OPEN REDUCTION INTERNAL FIXATION (ORIF) DISTAL RADIAL FRACTURE  03/17/2012   Procedure: OPEN REDUCTION INTERNAL FIXATION (ORIF) DISTAL RADIAL FRACTURE;  Surgeon: Roseanne Kaufman, MD;  Location: [place];  Service: Orthopedics;  Laterality: Right;  Pre-operative a supra-clavical block  right arm in addition to general anesthesia  . PORT-A-CATH REMOVAL Right 02/02/2018   Procedure: REMOVAL PORT-A-CATH;  Surgeon: Caitlin Bookbinder, MD;  Location: [place];  Service: General;  Laterality: Right;  . PORTACATH PLACEMENT N/A 07/30/2017   Procedure: INSERTION PORT-A-CATH WITH [place];  Surgeon: Caitlin Bookbinder, MD;  Location: [place];  Service: General;  Laterality: N/A;  . TUBAL LIGATION       ALLERGIES:  No Known Allergies   CURRENT MEDICATIONS:  Outpatient Encounter Medications as of 08/11/2018  Medication Sig  . gabapentin (NEURONTIN) 100 MG capsule Take 1 capsule in the morning and 3 capsules at bedtime  . ibuprofen (ADVIL,MOTRIN) 200 MG tablet Take 400 mg by mouth daily as needed for mild pain or moderate pain.  . metFORMIN (GLUCOPHAGE) 500 MG tablet Take 1 tablet (500 mg total) by mouth 2 (two) times daily with a meal.  . tamoxifen (NOLVADEX) 20 MG tablet Take 1 tablet (20 mg total) by mouth daily.  . [DISCONTINUED] gabapentin (NEURONTIN) 100 MG capsule Take 3 capsules (300 mg total) by mouth at bedtime.   No facility-administered encounter medications on file as of 08/11/2018.      ONCOLOGIC FAMILY HISTORY:  Family History  Problem Relation Age of Onset  . Hypertension Mother   . Diabetes Father   . Hypertension Father     SOCIAL HISTORY:  Social History   Socioeconomic History  . Marital status: Single    Spouse name: Not on file  . Number of children: Not on file  . Years of education: Not on file  . Highest education level: Not on file  Occupational History  . Not on file  Social Needs  . Financial resource strain: Not on file  . Food insecurity:    Worry: Not on file    Inability: Not on file  . Transportation needs:    Medical: No    Non-medical: No  Tobacco Use  . Smoking status: Never Smoker  . Smokeless tobacco: Never Used  Substance and Sexual Activity  . Alcohol use: Yes    Comment: occassionally  . Drug use: No  .  Sexual activity: Yes    Birth control/protection: Surgical  Lifestyle  . Physical activity:    Days per week: 2 days    Minutes per session: 30 min  . Stress: Only a little  Relationships  . Social connections:    Talks on phone: More than three times a week    Gets together: Once a week    Attends religious service: Never    Active member of club or organization: No    Attends meetings of clubs or organizations: Never    Relationship status: Never married  . Intimate partner violence:    Fear of current or ex partner: No    Emotionally abused: No  Physically abused: No    Forced sexual activity: No  Other Topics Concern  . Not on file  Social History Narrative   Lives with roommate   Works fulltime but seasonal work     OBSERVATIONS/OBJECTIVE:  Patient sounds well over the phone. Speech is clear, non-pressured.  LABORATORY DATA:  None for this visit.  DIAGNOSTIC IMAGING:  None for this visit.      ASSESSMENT AND PLAN:  Ms.. Zynda is a pleasant 53 y.o. female with Stage IB right breast invasive ductal carcinoma, ER+/PR+/HER2- Grade 3, diagnosed in 06/2017, treated with lumpectomy, adjuvant chemotherapy, radiation therapy, and anti-estrogen therapy with Tamoxifen beginning in 12/2017.  She presents to the Survivorship Clinic via phone/webex for our initial meeting and routine follow-up post-completion of treatment for breast cancer.    1. Stage IB right breast cancer:  Ms. Daigneault is continuing to recover from definitive treatment for breast cancer. She will follow-up with her medical oncologist, Dr. Burr Medico in 3 months with history and physical exam per surveillance protocol.  She will continue her anti-estrogen therapy with Tamoxifen. Thus far, she is tolerating it moderately well with side effects. She was instructed to make Burr Medico or myself aware if she begins to experience any worsening side effects of the medication and I could see her back in clinic to help manage those side  effects, as needed. Her mammogram in 06/2018 showed interval lumpectomy and postradiation changes, no evidence of malignancy in either breast. She will be due again in 06/2019. Her breast density is category b. Today, a comprehensive survivorship care plan and treatment summary was reviewed with the patient today detailing her breast cancer diagnosis, treatment course, potential late/long-term effects of treatment, appropriate follow-up care with recommendations for the future, and patient education resources.  A copy of this summary, along with a letter will be sent to the patient's primary care provider via mail/fax/In Basket message after today's visit.    2. Problem(s) at Visit:  - Hot flashes - which are increasing. She experiences 2 or more per day. She often overheats and becomes nauseous and loses her appetite. She is on gabapentin 200 mg at night for neuropathy, I recommend to add 100 mg AM to this dose to see if this helps.  - Bone joint pain - mostly in hips and back. She was previously referred to PT which is on hold due to covid19. I recommend she pursue this once restrictions are lifted. I recommend for her to be active, getting up to stretch/move once per hour, and engage in routine exercise. She agrees.   3. Bone health:  Ms. Fleeman is currently being treated with Tamoxifen, which has bone strengthening qualities. LMP 05/2017, more than 1 year ago. Will check labs to confirm she is post menopausal when she returns to clinic. I informed her when she is menopausal Dr. Burr Medico may change her to AI at which point she'll be at increased risk for bone demineralization. At that time we can begin screening for osteoporosis with DEXA scan. She understands. In the meantime, she was encouraged to increase her consumption of foods rich in calcium, as well as increase her weight-bearing activities.    4. Cancer screening:  Due to Ms. Fossett's history and her age, she should receive screening for skin cancers, colon  cancer, and gynecologic cancers.  She has not had colonoscopy yet, I recommend she get this done with covid19 restrictions are lifted. She agrees. I placed referral to GI today for routine screening. The  information and recommendations are listed on the patient's comprehensive care plan/treatment summary and were reviewed in detail with the patient.    5. Health maintenance and wellness promotion: Ms. Lamba was encouraged to consume 5-7 servings of fruits and vegetables per day. We reviewed the "Nutrition Rainbow" handout, as well as the handout "Take Control of Your Health and Reduce Your Cancer Risk" from the [place].  She was also encouraged to engage in moderate to vigorous exercise for 30 minutes per day most days of the week. We discussed the [employer] fitness program, which is designed for cancer survivors to help them become more physically fit after cancer treatments.  She was instructed to limit her alcohol consumption and continue to abstain from tobacco use. She does not have PCP, I referred her to one today.      6. Support services/counseling: It is not uncommon for this period of the patient's cancer care trajectory to be one of many emotions and stressors.  We discussed how this can be increasingly difficult during the times of quarantine and social distancing due to the COVID-19 pandemic.   She was given information regarding our available services and encouraged to contact me with any questions or for help enrolling in any of our support group/programs.    Follow up instructions:  -Return to cancer center in 3 months, next f/u scheduled 10/23/18  -Lab at next f/u, include CBC, CMP, FSH, estradiol  -Mammogram reviewed, negative; next due in 06/2019  -Follow up with surgery as needed  -Referrals: PCP, GI  -Increase gabapentin to 100 mg AM and 300 mg PM -Return to PT when covid19 restrictions are lifted (previously referred)  Orders Placed This Encounter  Procedures   . Estradiol    Standing Status:   Future    Standing Expiration Date:   08/11/2019  . FSH-Follicle stimulating hormone    Standing Status:   Future    Standing Expiration Date:   08/11/2019  . CBC with Differential (Cancer Center Only)    Standing Status:   Future    Standing Expiration Date:   08/11/2019  . CMP ([place] only)    Standing Status:   Future    Standing Expiration Date:   08/11/2019  . Ambulatory referral to Gastroenterology    Referral Priority:   Routine    Referral Type:   Consultation    Referral Reason:   Specialty Services Required    Number of Visits Requested:   1  . Ambulatory referral to Internal Medicine    Referral Priority:   Routine    Referral Type:   Consultation    Referral Reason:   Specialty Services Required    Requested Specialty:   Internal Medicine    Number of Visits Requested:   1    She is welcome to return back to the Survivorship Clinic at any time; no additional follow-up needed at this time. Consider referral back to survivorship as a long-term survivor for continued surveillance The patient was provided an opportunity to ask questions and all were answered. The patient agreed with the plan and demonstrated an understanding of the instructions. The patient was advised to call back or seek an in-person evaluation if the symptoms worsen or if the condition fails to improve as anticipated.   I provided 35 minutes of non-face-to-face time during this encounter.    Caitlin Feeling, NP

## 2018-08-06 ENCOUNTER — Telehealth: Payer: Self-pay | Admitting: Hematology

## 2018-08-06 NOTE — Telephone Encounter (Signed)
Called regarding upcoming Webex appointment, patient is notified and test run complete. Webex invite sent.

## 2018-08-11 ENCOUNTER — Inpatient Hospital Stay: Payer: BLUE CROSS/BLUE SHIELD | Attending: Hematology | Admitting: Nurse Practitioner

## 2018-08-11 ENCOUNTER — Telehealth: Payer: Self-pay | Admitting: Nurse Practitioner

## 2018-08-11 DIAGNOSIS — Z9221 Personal history of antineoplastic chemotherapy: Secondary | ICD-10-CM | POA: Diagnosis not present

## 2018-08-11 DIAGNOSIS — G620 Drug-induced polyneuropathy: Secondary | ICD-10-CM

## 2018-08-11 DIAGNOSIS — C50211 Malignant neoplasm of upper-inner quadrant of right female breast: Secondary | ICD-10-CM | POA: Diagnosis not present

## 2018-08-11 DIAGNOSIS — Z7984 Long term (current) use of oral hypoglycemic drugs: Secondary | ICD-10-CM

## 2018-08-11 DIAGNOSIS — Z170 Estrogen receptor positive status [ER+]: Secondary | ICD-10-CM

## 2018-08-11 DIAGNOSIS — Z923 Personal history of irradiation: Secondary | ICD-10-CM

## 2018-08-11 DIAGNOSIS — Z79810 Long term (current) use of selective estrogen receptor modulators (SERMs): Secondary | ICD-10-CM

## 2018-08-11 DIAGNOSIS — Z79899 Other long term (current) drug therapy: Secondary | ICD-10-CM

## 2018-08-11 DIAGNOSIS — T451X5A Adverse effect of antineoplastic and immunosuppressive drugs, initial encounter: Secondary | ICD-10-CM

## 2018-08-11 MED ORDER — GABAPENTIN 100 MG PO CAPS: ORAL_CAPSULE | ORAL | 1 refills | Status: DC

## 2018-08-11 NOTE — Telephone Encounter (Signed)
Per 4/28 los sent referrals thru [facility].

## 2018-08-11 NOTE — Addendum Note (Signed)
Addended by: [name] on: 08/11/2018 11:18 AM   Modules accepted: Orders

## 2018-08-26 ENCOUNTER — Other Ambulatory Visit: Payer: Self-pay

## 2018-08-26 ENCOUNTER — Encounter: Payer: Self-pay | Admitting: Physical Therapy

## 2018-08-26 ENCOUNTER — Ambulatory Visit: Payer: Medicaid Other | Attending: Hematology | Admitting: Physical Therapy

## 2018-08-26 DIAGNOSIS — Z483 Aftercare following surgery for neoplasm: Secondary | ICD-10-CM | POA: Diagnosis not present

## 2018-08-26 DIAGNOSIS — R293 Abnormal posture: Secondary | ICD-10-CM

## 2018-08-26 DIAGNOSIS — M6281 Muscle weakness (generalized): Secondary | ICD-10-CM | POA: Diagnosis present

## 2018-08-26 DIAGNOSIS — I890 Lymphedema, not elsewhere classified: Secondary | ICD-10-CM

## 2018-08-26 NOTE — Therapy (Signed)
[place], [place], [address] Phone: [phone]   Fax:  [phone]  Physical Therapy Evaluation  Patient Details  Name: Caitlin Cohen MRN: [id number] Date of Birth: 20-Mar-1966 Referring Provider (PT): Dr. Burr Medico  (Dr. Donne Hazel  was surgeon )   Encounter Date: 08/26/2018  PT End of Session - 08/26/18 1955    Visit Number  1    Number of Visits  4    PT Start Time  1300    PT Stop Time  1345    PT Time Calculation (min)  45 min    Activity Tolerance  Patient tolerated treatment well    Behavior During Therapy  [facility] for tasks assessed/performed       Past Medical History:  Diagnosis Date  . Anemia   . Anxiety   . Cancer [facility])    breast cancer right  . Depression   . GERD (gastroesophageal reflux disease)   . Headache   . History of radiation therapy 11/10/17- 12/08/17   right breast, 40.05 Gy in 15 fractions. Right breast boost 10 Gy in 5 fractions.   . Personal history of chemotherapy   . Personal history of radiation therapy     Past Surgical History:  Procedure Laterality Date  . CERVICAL CONE BIOPSY    . CHOLECYSTECTOMY    . MASTECTOMY WITH RADIOACTIVE SEED GUIDED EXCISION AND AXILLARY SENTINEL LYMPH NODE BIOPSY Right 07/08/2017   Procedure: RIGHT LUMPTECTOMY WITH RADIOACTIVE SEED GUIDED EXCISION AND RIGHT AXILLARY SENTINEL LYMPH NODE BIOPSY ERAS PATHWAY;  Surgeon: [name], MD;  Location: [place];  Service: General;  Laterality: Right;  PEC BLOCK  . OPEN REDUCTION INTERNAL FIXATION (ORIF) DISTAL RADIAL FRACTURE  03/17/2012   Procedure: OPEN REDUCTION INTERNAL FIXATION (ORIF) DISTAL RADIAL FRACTURE;  Surgeon: Roseanne Kaufman, MD;  Location: [place];  Service: Orthopedics;  Laterality: Right;  Pre-operative a supra-clavical block right arm in addition to general anesthesia  . PORT-A-CATH REMOVAL Right 02/02/2018   Procedure: REMOVAL PORT-A-CATH;  Surgeon: [name], MD;   Location: [place];  Service: General;  Laterality: Right;  . PORTACATH PLACEMENT N/A 07/30/2017   Procedure: INSERTION PORT-A-CATH WITH [place];  Surgeon: [name], MD;  Location: [place];  Service: General;  Laterality: N/A;  . TUBAL LIGATION      There were no vitals filed for this visit.   Subjective Assessment - 08/26/18 1312    Subjective  comes to PT for treatment of right breast swelling and also help with pain in her back, hips and legs that is new after chemotherapy.  She plans to call Dr. Burr Medico to talk about that too.     Pertinent History  right breast cancer in March 2019 with lumpectomy 07/08/2017 followed by chemotherapy in April and radiatin that completed 12/08/2017. She had some numbness and tingling in hands and feet that is resolving and only mild skin changes with radiaiton. She developed swelling in her breast after the surgery.  She does not have a compression bra     Currently in Pain?  No/denies         [facility] PT Assessment - 08/26/18 0001      Assessment   Medical Diagnosis  right breast cancer     Referring Provider (PT)  Dr. Burr Medico    Dr. Donne Hazel  was surgeon    Onset Date/Surgical Date  07/08/17    Hand Dominance  Right      Precautions  Precautions  None      Restrictions   Weight Bearing Restrictions  No      Balance Screen   Has the patient fallen in the past 6 months  No    Has the patient had a decrease in activity level because of a fear of falling?   No    Is the patient reluctant to leave their home because of a fear of falling?   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   limited by numbness in feet,  pain in hips and back      Cognition   Overall Cognitive Status  Within Functional Limits for tasks assessed      Observation/Other Assessments   Observations  Pt has darkening and fullness in right breast     Quick DASH   13.64      Sensation   Additional Comments  pt with intermittent tingling in hands and feet from chemotherapy       Coordination   Gross Motor Movements are Fluid and Coordinated  No   quality of movement decreased with fatigue     Functional Tests   Functional tests  Sit to Stand      Sit to Stand   Comments  8 reps in 30 seconds with observable dyspnea and some pain in right hip       Posture/Postural Control   Posture/Postural Control  Postural limitations    Postural Limitations  Increased lumbar lordosis;Decreased thoracic kyphosis      ROM / Strength   AROM / PROM / Strength  AROM;PROM;Strength      AROM   Overall AROM Comments  Full ROM.  Pt states she has always been "flexible"       Strength   Overall Strength Comments  generalized weakness     Right Hand Grip (lbs)  20/20/20    Left Hand Grip (lbs)  45/45/48      Palpation   Palpation comment  firm areas in right breast from lymphatic congestion       Balance   Balance Assessed  Yes      High Level Balance   High Level Balance Comments  After standing for 90 sec. pt starts to get hot, uncomfortable with need to shift weight off of feet.         LYMPHEDEMA/ONCOLOGY QUESTIONNAIRE - 08/26/18 1952      Type   Cancer Type  breast       Surgeries   Lumpectomy Date  07/08/17    Sentinel Lymph Node Biopsy Date  07/08/17    Number Lymph Nodes Removed  1      Treatment   Past Chemotherapy Treatment  Yes    Past Radiation Treatment  Yes      What other symptoms do you have   Are you Having Heaviness or Tightness  Yes      Lymphedema Assessments   Lymphedema Assessments  --   pt with visible lymphedema in right breast          Quick Dash - 08/26/18 0001  Open a tight or  new jar  Mild difficulty    Do heavy household chores (wash walls, wash floors)  Mild difficulty    Carry a shopping bag or briefcase  No difficulty    Wash your back  No difficulty    Use a knife to cut food  No difficulty    Recreational activities in which you take some force or impact through your arm, shoulder, or hand (golf, hammering, tennis)  No difficulty    During the past week, to what extent has your arm, shoulder or hand problem interfered with your normal social activities with family, friends, neighbors, or groups?  Not at all    During the past week, to what extent has your arm, shoulder or hand problem limited your work or other regular daily activities  Not at all    Arm, shoulder, or hand pain.  Mild    Tingling (pins and needles) in your arm, shoulder, or hand  Severe    Difficulty Sleeping  No difficulty    DASH Score  13.64 %           Objective measurements completed on examination: See above findings.      [place] Adult PT Treatment/Exercise - 08/26/18 0001      Self-Care   Self-Care  Other Self-Care Comments    Other Self-Care Comments   pt would beneft from a 2XL Prarie Hugger compression bra with extender.  Placed a call to [facility] to see if she could order with Medicaid coverage but had to leave a message                   PT Long Term Goals - 08/26/18 2003      PT LONG TERM GOAL #1   Title  Pt will be independent in self manual lymph drainage and use of compression to manage right breast lymphedema     Baseline  no knowledge     Time  4    Period  Weeks    Status  New      PT LONG TERM GOAL #2   Title  Pt will be independent in a home exercise program for strength    Baseline  no knowledge      PT LONG TERM GOAL #3   Title  Pt will able to do 11 repetitions of sit to stand in 30 seconds indicating an improvment in general strength     Baseline  8    Time  4    Period  Weeks    Status  New             Plan - 08/26/18 1956     Clinical Impression Statement  Caitlin Cohen comes to PT with lymphedema in right breast but also with c/o pain in back and legs with decreased ablility to do repeated sit to stand in 30 seconds and weakness in right grip strength which is indicative of decreased general strength.  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  She needs to get a compression bra and learn self MLD and exercises to help with leg stregth.     Personal Factors and Comorbidities  Fitness;Comorbidity 1;Comorbidity 2;Past/Current Experience    Comorbidities  past chemo with neuropathy and radiation to right upper quadrant     Examination-Activity Limitations  Locomotion Level;Reach Overhead;Transfers    Stability/Clinical Decision Making  Stable/Uncomplicated    Clinical Decision Making  Low    PT Frequency  1x / week    PT Duration  4 weeks    PT Treatment/Interventions  ADLs/Self Care Home Management;Therapeutic activities;Manual lymph drainage;Manual techniques;Patient/family education;Orthotic Fit/Training;Therapeutic exercise;Balance training;Neuromuscular re-education;Gait training    PT Next Visit Plan  teach MLD to right breast, follow up with Melissa recompresion bra. progress LE and core  strength and balance exercise     Consulted and Agree with Plan of Care  Patient       Patient will benefit from skilled therapeutic intervention in order to improve the following deficits and impairments:  Abnormal gait, Increased fascial restricitons, Pain, Impaired sensation, Postural dysfunction, Decreased strength, Impaired UE functional use, Obesity, Increased edema, Difficulty walking  Visit Diagnosis: Aftercare following surgery for neoplasm - Plan: PT plan of care cert/re-cert  Abnormal posture - Plan: PT plan of care cert/re-cert  Muscle weakness (generalized) - Plan: PT plan of care cert/re-cert  Lymphedema, not elsewhere classified - Plan: PT plan of care cert/re-cert     Problem List Patient Active Problem  List   Diagnosis Date Noted  . DM (diabetes mellitus), type 2 ([place]) 09/28/2017  . Febrile neutropenia ([place]) 09/26/2017  . Sepsis ([place]) 09/26/2017  . Diarrhea 09/26/2017  . Port-A-Cath in place 08/29/2017  . Malignant neoplasm of upper-inner quadrant of right breast in female, estrogen receptor positive ([place]) 07/01/2017  . Mood disorder ([place]) 07/05/2013    Class: Acute   Donato Heinz. Owens Shark PT  Norwood Levo 08/26/2018, 8:08 PM  [place], [place], [address] Phone: [phone]   Fax:  [phone]  Name: Caitlin Cohen MRN: [id number] Date of Birth: 02-01-1966

## 2018-08-28 ENCOUNTER — Encounter: Payer: Self-pay | Admitting: Nurse Practitioner

## 2018-09-02 ENCOUNTER — Telehealth: Payer: Self-pay | Admitting: Hematology

## 2018-09-02 NOTE — Telephone Encounter (Signed)
Faxed medical records to Romeo Rabon with [facility]. Release EX#[id number]

## 2018-09-04 ENCOUNTER — Other Ambulatory Visit: Payer: Self-pay

## 2018-09-04 ENCOUNTER — Ambulatory Visit: Payer: Medicaid Other

## 2018-09-04 ENCOUNTER — Telehealth: Payer: Self-pay | Admitting: Nurse Practitioner

## 2018-09-04 DIAGNOSIS — Z483 Aftercare following surgery for neoplasm: Secondary | ICD-10-CM

## 2018-09-04 DIAGNOSIS — M6281 Muscle weakness (generalized): Secondary | ICD-10-CM

## 2018-09-04 DIAGNOSIS — R293 Abnormal posture: Secondary | ICD-10-CM

## 2018-09-04 DIAGNOSIS — I890 Lymphedema, not elsewhere classified: Secondary | ICD-10-CM

## 2018-09-04 NOTE — Therapy (Signed)
[place], [place], [address] Phone: [phone]   Fax:  [phone]  Physical Therapy Treatment  Patient Details  Name: Caitlin Cohen MRN: [id number] Date of Birth: 1965/09/22 Referring Provider (PT): Dr. Burr Medico  (Dr. Donne Hazel  was surgeon )   Encounter Date: 09/04/2018  PT End of Session - 09/04/18 0854    Visit Number  2    Number of Visits  4    PT Start Time  0802    PT Stop Time  0851    PT Time Calculation (min)  49 min    Activity Tolerance  Patient tolerated treatment well    Behavior During Therapy  [facility] for tasks assessed/performed       Past Medical History:  Diagnosis Date  . Anemia   . Anxiety   . Cancer [facility])    breast cancer right  . Depression   . GERD (gastroesophageal reflux disease)   . Headache   . History of radiation therapy 11/10/17- 12/08/17   right breast, 40.05 Gy in 15 fractions. Right breast boost 10 Gy in 5 fractions.   . Personal history of chemotherapy   . Personal history of radiation therapy     Past Surgical History:  Procedure Laterality Date  . CERVICAL CONE BIOPSY    . CHOLECYSTECTOMY    . MASTECTOMY WITH RADIOACTIVE SEED GUIDED EXCISION AND AXILLARY SENTINEL LYMPH NODE BIOPSY Right 07/08/2017   Procedure: RIGHT LUMPTECTOMY WITH RADIOACTIVE SEED GUIDED EXCISION AND RIGHT AXILLARY SENTINEL LYMPH NODE BIOPSY ERAS PATHWAY;  Surgeon: [name], MD;  Location: [place];  Service: General;  Laterality: Right;  PEC BLOCK  . OPEN REDUCTION INTERNAL FIXATION (ORIF) DISTAL RADIAL FRACTURE  03/17/2012   Procedure: OPEN REDUCTION INTERNAL FIXATION (ORIF) DISTAL RADIAL FRACTURE;  Surgeon: Roseanne Kaufman, MD;  Location: [place];  Service: Orthopedics;  Laterality: Right;  Pre-operative a supra-clavical block right arm in addition to general anesthesia  . PORT-A-CATH REMOVAL Right 02/02/2018   Procedure: REMOVAL PORT-A-CATH;  Surgeon: [name], MD;   Location: [place];  Service: General;  Laterality: Right;  . PORTACATH PLACEMENT N/A 07/30/2017   Procedure: INSERTION PORT-A-CATH WITH [place];  Surgeon: [name], MD;  Location: [place];  Service: General;  Laterality: N/A;  . TUBAL LIGATION      There were no vitals filed for this visit.  Subjective Assessment - 09/04/18 0804    Subjective  Nothing new since I was for evaluation.     Pertinent History  right breast cancer in March 2019 with lumpectomy 07/08/2017 followed by chemotherapy in April and radiatin that completed 12/08/2017. She had some numbness and tingling in hands and feet that is resolving and only mild skin changes with radiaiton. She developed swelling in her breast after the surgery.  She does not have a compression bra     Currently in Pain?  No/denies                       [facility] Adult PT Treatment/Exercise - 09/04/18 0001      Manual Therapy   Manual Therapy  Manual Lymphatic Drainage (MLD)    Manual Lymphatic Drainage (MLD)  In Supine: Short neck, superficial and deep abdominals, Lt axillary and Rt inguinal nodes, anterior inter-axillary and Rt axillo-inguinal anastomosis, then focused on Rt breast, into Lt S/L for posterior inter-axillary and further work to [employer] axillo-inguinal anastomosis, then finished in supine to retrace all steps beginning  to instruct pt througout in sequencing.                   PT Long Term Goals - 08/26/18 2003      PT LONG TERM GOAL #1   Title  Pt will be independent in self manual lymph drainage and use of compression to manage right breast lymphedema     Baseline  no knowledge     Time  4    Period  Weeks    Status  New      PT LONG TERM GOAL #2   Title  Pt will be independent in a home exercise program for strength    Baseline  no knowledge      PT LONG TERM GOAL #3   Title  Pt will able to do 11 repetitions of sit to stand in 30 seconds indicating an improvment in general strength      Baseline  8    Time  4    Period  Weeks    Status  New            Plan - 09/04/18 7619    Clinical Impression Statement  First session of manaul lymph drainage today to Rt breast. Pt tolerated this very well. Was instructed in basics of anatomy of lymphatic system and principles of MLD. Pt reported feeling good after session and softening of breast noticed by her and therapist.     Personal Factors and Comorbidities  Fitness;Comorbidity 1;Comorbidity 2;Past/Current Experience    Comorbidities  past chemo with neuropathy and radiation to right upper quadrant     Examination-Activity Limitations  Locomotion Level;Reach Overhead;Transfers    Stability/Clinical Decision Making  Stable/Uncomplicated    PT Frequency  1x / week    PT Duration  4 weeks    PT Treatment/Interventions  ADLs/Self Care Home Management;Therapeutic activities;Manual lymph drainage;Manual techniques;Patient/family education;Orthotic Fit/Training;Therapeutic exercise;Balance training;Neuromuscular re-education;Gait training    PT Next Visit Plan  Cont and instruct pt in MLD to right breast, follow up with Melissa re: compresion bra. progress LE and core  strength and balance exercise     Consulted and Agree with Plan of Care  Patient       Patient will benefit from skilled therapeutic intervention in order to improve the following deficits and impairments:  Abnormal gait, Increased fascial restricitons, Pain, Impaired sensation, Postural dysfunction, Decreased strength, Impaired UE functional use, Obesity, Increased edema, Difficulty walking  Visit Diagnosis: Aftercare following surgery for neoplasm  Abnormal posture  Muscle weakness (generalized)  Lymphedema, not elsewhere classified     Problem List Patient Active Problem List   Diagnosis Date Noted  . DM (diabetes mellitus), type 2 ([place]) 09/28/2017  . Febrile neutropenia ([place]) 09/26/2017  . Sepsis ([place]) 09/26/2017  . Diarrhea 09/26/2017  .  Port-A-Cath in place 08/29/2017  . Malignant neoplasm of upper-inner quadrant of right breast in female, estrogen receptor positive ([place]) 07/01/2017  . Mood disorder ([place]) 07/05/2013    Class: Acute    Otelia Limes, PTA 09/04/2018, 8:57 AM  [place], [place], [address] Phone: [phone]   Fax:  [phone]  Name: Chasmine Lender MRN: [id number] Date of Birth: October 11, 1965

## 2018-09-04 NOTE — Telephone Encounter (Signed)
Sent referral per sch msg

## 2018-09-11 ENCOUNTER — Ambulatory Visit: Payer: Medicaid Other

## 2018-09-11 ENCOUNTER — Other Ambulatory Visit: Payer: Self-pay

## 2018-09-11 DIAGNOSIS — Z483 Aftercare following surgery for neoplasm: Secondary | ICD-10-CM

## 2018-09-11 DIAGNOSIS — M6281 Muscle weakness (generalized): Secondary | ICD-10-CM

## 2018-09-11 DIAGNOSIS — R293 Abnormal posture: Secondary | ICD-10-CM

## 2018-09-11 DIAGNOSIS — I890 Lymphedema, not elsewhere classified: Secondary | ICD-10-CM

## 2018-09-11 NOTE — Therapy (Signed)
[place], [place], [address] Phone: [phone]   Fax:  [phone]  Physical Therapy Treatment  Patient Details  Name: Caitlin Cohen MRN: [id number] Date of Birth: 11/24/1965 Referring Provider (PT): Dr. Burr Medico  (Dr. Donne Hazel  was surgeon )   Encounter Date: 09/11/2018  PT End of Session - 09/11/18 0852    Visit Number  3    Number of Visits  4    Date for PT Re-Evaluation  09/26/18    PT Start Time  0802    PT Stop Time  0849    PT Time Calculation (min)  47 min    Activity Tolerance  Patient tolerated treatment well    Behavior During Therapy  [facility] for tasks assessed/performed       Past Medical History:  Diagnosis Date  . Anemia   . Anxiety   . Cancer [facility])    breast cancer right  . Depression   . GERD (gastroesophageal reflux disease)   . Headache   . History of radiation therapy 11/10/17- 12/08/17   right breast, 40.05 Gy in 15 fractions. Right breast boost 10 Gy in 5 fractions.   . Personal history of chemotherapy   . Personal history of radiation therapy     Past Surgical History:  Procedure Laterality Date  . CERVICAL CONE BIOPSY    . CHOLECYSTECTOMY    . MASTECTOMY WITH RADIOACTIVE SEED GUIDED EXCISION AND AXILLARY SENTINEL LYMPH NODE BIOPSY Right 07/08/2017   Procedure: RIGHT LUMPTECTOMY WITH RADIOACTIVE SEED GUIDED EXCISION AND RIGHT AXILLARY SENTINEL LYMPH NODE BIOPSY ERAS PATHWAY;  Surgeon: [name], MD;  Location: [place];  Service: General;  Laterality: Right;  PEC BLOCK  . OPEN REDUCTION INTERNAL FIXATION (ORIF) DISTAL RADIAL FRACTURE  03/17/2012   Procedure: OPEN REDUCTION INTERNAL FIXATION (ORIF) DISTAL RADIAL FRACTURE;  Surgeon: Roseanne Kaufman, MD;  Location: [place];  Service: Orthopedics;  Laterality: Right;  Pre-operative a supra-clavical block right arm in addition to general anesthesia  . PORT-A-CATH REMOVAL Right 02/02/2018   Procedure: REMOVAL PORT-A-CATH;   Surgeon: [name], MD;  Location: [place];  Service: General;  Laterality: Right;  . PORTACATH PLACEMENT N/A 07/30/2017   Procedure: INSERTION PORT-A-CATH WITH [place];  Surgeon: [name], MD;  Location: [place];  Service: General;  Laterality: N/A;  . TUBAL LIGATION      There were no vitals filed for this visit.  Subjective Assessment - 09/11/18 0808    Subjective  I saw Dr. Nelva Bush for my back and he put me on Tramodol 3x/day prn. They only gave me 20 tablets, and I go June 4 for an injection in my back. My Rt breast was alot better after last session, I could really tell a difference. My hips are actually doing okay right now.     Pertinent History  right breast cancer in March 2019 with lumpectomy 07/08/2017 followed by chemotherapy in April and radiatin that completed 12/08/2017. She had some numbness and tingling in hands and feet that is resolving and only mild skin changes with radiaiton. She developed swelling in her breast after the surgery.  She does not have a compression bra     Currently in Pain?  Yes    Pain Score  8     Pain Location  Back    Pain Orientation  Lower;Right    Pain Descriptors / Indicators  Shooting;Constant;Stabbing    Pain Type  Chronic pain    Pain  Onset  More than a month ago    Pain Frequency  Intermittent    Aggravating Factors   it's just been the worse it's been for the past month from the bone spur    Pain Relieving Factors  Tramodol                       [facility] Adult PT Treatment/Exercise - 09/11/18 0001      Manual Therapy   Manual Therapy  Manual Lymphatic Drainage (MLD)    Manual Lymphatic Drainage (MLD)  In Supine: Short neck, 5 diaphragmatic breaths, Lt axillary and Rt inguinal nodes, anterior inter-axillary and Rt axillo-inguinal anastomosis, then focused on Rt breast, into Lt S/L for posterior inter-axillary and further work to [employer] axillo-inguinal anastomosis, then finished in supine to retrace all  steps having pt return demonstration throughout.             PT Education - 09/11/18 0815    Education Details  Self manual lymph drainage of Rt breast    Person(s) Educated  Patient    Methods  Explanation;Demonstration;Handout    Comprehension  Verbalized understanding;Returned demonstration;Need further instruction;Tactile cues required;Verbal cues required          PT Long Term Goals - 08/26/18 2003      PT LONG TERM GOAL #1   Title  Pt will be independent in self manual lymph drainage and use of compression to manage right breast lymphedema     Baseline  no knowledge     Time  4    Period  Weeks    Status  New      PT LONG TERM GOAL #2   Title  Pt will be independent in a home exercise program for strength    Baseline  no knowledge      PT LONG TERM GOAL #3   Title  Pt will able to do 11 repetitions of sit to stand in 30 seconds indicating an improvment in general strength     Baseline  8    Time  4    Period  Weeks    Status  New            Plan - 09/11/18 4166    Clinical Impression Statement  Continued with manual lymph drainage of Rt breast today but also included instructing pt in this and having her return demonstration. Hand over hand technique used to instruct pt with lighter pressure and no sliding on skin. She was able to return good demo by end of session. Pt has scan for her back next week.     Personal Factors and Comorbidities  Fitness;Comorbidity 1;Comorbidity 2;Past/Current Experience    Comorbidities  past chemo with neuropathy and radiation to right upper quadrant     Examination-Activity Limitations  Locomotion Level;Reach Overhead;Transfers    Stability/Clinical Decision Making  Stable/Uncomplicated    PT Frequency  1x / week    PT Duration  4 weeks    PT Treatment/Interventions  ADLs/Self Care Home Management;Therapeutic activities;Manual lymph drainage;Manual techniques;Patient/family education;Orthotic Fit/Training;Therapeutic  exercise;Balance training;Neuromuscular re-education;Gait training    PT Next Visit Plan  If going to cont PT pt will need Medicaid renewal next session; Review MLD to right breast, follow up with Melissa re: compresion bra and next session progress LE and core strength and balance exercise     Consulted and Agree with Plan of Care  Patient       Patient will benefit from skilled  therapeutic intervention in order to improve the following deficits and impairments:  Abnormal gait, Increased fascial restricitons, Pain, Impaired sensation, Postural dysfunction, Decreased strength, Impaired UE functional use, Obesity, Increased edema, Difficulty walking  Visit Diagnosis: Aftercare following surgery for neoplasm  Abnormal posture  Muscle weakness (generalized)  Lymphedema, not elsewhere classified     Problem List Patient Active Problem List   Diagnosis Date Noted  . DM (diabetes mellitus), type 2 ([place]) 09/28/2017  . Febrile neutropenia ([place]) 09/26/2017  . Sepsis ([place]) 09/26/2017  . Diarrhea 09/26/2017  . Port-A-Cath in place 08/29/2017  . Malignant neoplasm of upper-inner quadrant of right breast in female, estrogen receptor positive ([place]) 07/01/2017  . Mood disorder ([place]) 07/05/2013    Class: Acute    Otelia Limes, PTA 09/11/2018, 9:00 AM  [place], [place], [address] Phone: [phone]   Fax:  [phone]  Name: Caitlin Cohen MRN: [id number] Date of Birth: 1966-02-21

## 2018-09-11 NOTE — Patient Instructions (Signed)
Self manual lymph drainage: Perform this sequence once a day.  Only give enough pressure no your skin to make the skin move.    Start with circles near the neck above the collarbones, 10 times each side.  Diaphragmatic - Supine   Inhale through nose making navel move out toward hands. Exhale through puckered lips, hands follow navel in. Repeat _5__ times. Rest _10__ seconds between repeats.   Copyright  VHI. All rights reserved.  Hug yourself.  Do circles at your neck just above your collarbones.  Repeat this 10 times.  Axilla - One at a Time   Using full weight of flat hand and fingers at center of uninvolved armpit, make _10__ in-place circles.   Copyright  VHI. All rights reserved.  LEG: Inguinal Nodes Stimulation   With small finger side of hand against hip crease on involved side, gently perform circles at the crease. Repeat __10_ times.   Copyright  VHI. All rights reserved.  1) Axilla to Inguinal Nodes - Sweep   On involved side, sweep _4__ times from armpit along side of trunk to hip crease.  Now gently stretch skin from the involved side to the uninvolved side across the chest at the shoulder line.  Repeat that 4 times.  Draw an imaginary diagonal line from upper outer breast through the nipple area toward lower inner breast.  Direct fluid upward and inward from this line toward the pathway across your upper chest .  Do this in three rows to treat all of the upper inner breast tissue, and do each row 3-4x.      Direct fluid to treat all of lower outer breast tissue downward and outward toward pathway that is aimed at the right groin.  Finish by doing the pathways as described above going from your involved armpit to the same side groin and going across your upper chest from the involved shoulder to the uninvolved shoulder.  Repeat the steps above where you do circles in your right groin and left armpit.  Cancer Rehab [phone]

## 2018-09-15 ENCOUNTER — Other Ambulatory Visit: Payer: Self-pay

## 2018-09-15 ENCOUNTER — Encounter: Payer: Self-pay | Admitting: Physical Therapy

## 2018-09-15 ENCOUNTER — Ambulatory Visit: Payer: Medicaid Other | Attending: Hematology | Admitting: Physical Therapy

## 2018-09-15 DIAGNOSIS — I890 Lymphedema, not elsewhere classified: Secondary | ICD-10-CM

## 2018-09-15 DIAGNOSIS — M6281 Muscle weakness (generalized): Secondary | ICD-10-CM | POA: Insufficient documentation

## 2018-09-15 DIAGNOSIS — R293 Abnormal posture: Secondary | ICD-10-CM | POA: Diagnosis present

## 2018-09-15 DIAGNOSIS — Z483 Aftercare following surgery for neoplasm: Secondary | ICD-10-CM | POA: Insufficient documentation

## 2018-09-15 NOTE — Patient Instructions (Signed)

## 2018-09-15 NOTE — Therapy (Addendum)
[place], [place], [address] Phone: [phone]   Fax:  [phone]  Physical Therapy Treatment  Patient Details  Name: Caitlin Cohen MRN: [id number] Date of Birth: [birth year] Referring Provider (PT): Dr. Burr Medico  (Dr. Donne Hazel  was surgeon )   Encounter Date: 09/15/2018  PT End of Session - 09/15/18 1428    Visit Number  4    Number of Visits  12    Date for PT Re-Evaluation  10/28/18    PT Start Time  1230    PT Stop Time  1315    PT Time Calculation (min)  45 min    Activity Tolerance  Patient tolerated treatment well    Behavior During Therapy  [facility] for tasks assessed/performed       Past Medical History:  Diagnosis Date  . Anemia   . Anxiety   . Cancer [facility])    breast cancer right  . Depression   . GERD (gastroesophageal reflux disease)   . Headache   . History of radiation therapy 11/10/17- 12/08/17   right breast, 40.05 Gy in 15 fractions. Right breast boost 10 Gy in 5 fractions.   . Personal history of chemotherapy   . Personal history of radiation therapy     Past Surgical History:  Procedure Laterality Date  . CERVICAL CONE BIOPSY    . CHOLECYSTECTOMY    . MASTECTOMY WITH RADIOACTIVE SEED GUIDED EXCISION AND AXILLARY SENTINEL LYMPH NODE BIOPSY Right 07/08/2017   Procedure: RIGHT LUMPTECTOMY WITH RADIOACTIVE SEED GUIDED EXCISION AND RIGHT AXILLARY SENTINEL LYMPH NODE BIOPSY ERAS PATHWAY;  Surgeon: [name], MD;  Location: [place];  Service: General;  Laterality: Right;  PEC BLOCK  . OPEN REDUCTION INTERNAL FIXATION (ORIF) DISTAL RADIAL FRACTURE  03/17/2012   Procedure: OPEN REDUCTION INTERNAL FIXATION (ORIF) DISTAL RADIAL FRACTURE;  Surgeon: Roseanne Kaufman, MD;  Location: [place];  Service: Orthopedics;  Laterality: Right;  Pre-operative a supra-clavical block right arm in addition to general anesthesia  . PORT-A-CATH REMOVAL Right 02/02/2018   Procedure: REMOVAL PORT-A-CATH;   Surgeon: [name], MD;  Location: [place];  Service: General;  Laterality: Right;  . PORTACATH PLACEMENT N/A 07/30/2017   Procedure: INSERTION PORT-A-CATH WITH [place];  Surgeon: [name], MD;  Location: [place];  Service: General;  Laterality: N/A;  . TUBAL LIGATION      There were no vitals filed for this visit.  Subjective Assessment - 09/15/18 1239    Subjective  Pt is having an injection for the back on Thursday .  She is still having pain in her hips     Pertinent History  right breast cancer in March 2019 with lumpectomy 07/08/2017 followed by chemotherapy in April and radiatin that completed 12/08/2017. She had some numbness and tingling in hands and feet that is resolving and only mild skin changes with radiaiton. She developed swelling in her breast after the surgery.  She does not have a compression bra     Patient Stated Goals  lessen or get rid of the joint pain     Currently in Pain?  Yes    Pain Score  8     Pain Location  Back    Pain Orientation  Lower    Pain Descriptors / Indicators  Discomfort;Pressure;Constant    Pain Type  Chronic pain    Pain Onset  More than a month ago    Pain Frequency  Constant    Aggravating Factors  can't get comfortable in any position     Pain Relieving Factors  tramodol lessens it, but does not take it away          [facility] PT Assessment - 09/15/18 0001      Assessment   Medical Diagnosis  right breast cancer     Referring Provider (PT)  Dr. Burr Medico    Dr. Donne Hazel  was surgeon    Onset Date/Surgical Date  07/08/17      Prior Function   Level of Independence  Independent      Sit to Stand   Comments  8 reps in 30 sec ( limited by back pain )                    [facility] Adult PT Treatment/Exercise - 09/15/18 0001      Exercises   Exercises  Shoulder;Lumbar      Lumbar Exercises: Supine   Pelvic Tilt  10 reps    + 10 reps of tilts wit pelvic floor    Clam  5 reps   with each leg with  core stabalzied    Heel Slides  5 reps    Heel Slides Limitations  cues to keep pelvic stable     Bent Knee Raise  10 reps      Shoulder Exercises: Supine   Other Supine Exercises  supine dowel rod flexion stretch       Manual Therapy   Manual Therapy  Edema management;Manual Lymphatic Drainage (MLD);Passive ROM    Edema Management  Contacted [employer] rep who will follow up on the compression bra. She said it will be mailed to Kim's house.  Reviewed how she should adjust her bra when she gets it for compression to right axilla     Manual Lymphatic Drainage (MLD)  Pt able to verbalize and reviewe MLD sequence and says she is having trouble with diaphragmatic breaths.  Reviewed those and performed MLD for her to fullness in right axilla and in sidelying to posterior interaxillary anastamosis     Passive ROM  to right shoulder in limits of ROM              PT Education - 09/15/18 1326    Education Details  transverse abdominals and core stabalization, how to adjust her compression bra     Person(s) Educated  Patient    Methods  Explanation;Demonstration    Comprehension  Verbalized understanding;Returned demonstration          PT Long Term Goals - 09/15/18 1248      PT LONG TERM GOAL #1   Title  Pt will be independent in self manual lymph drainage and use of compression to manage right breast lymphedema     Baseline  no knowledge . 09/15/2018: pt has not received compression bra yet     Time  4    Period  Weeks    Status  On-going      PT LONG TERM GOAL #2   Title  Pt will be independent in a home exercise program for strength    Baseline  no knowledge, 09/15/2018 have started core exercise , needs to progress to extremity exercise     Status  On-going      PT LONG TERM GOAL #3   Title  Pt will able to do 11 repetitions of sit to stand in 30 seconds indicating an improvment in general strength     Baseline  8 on eval,  8 on 09/15/2018 limited by back pain     Time  4    Status   On-going            Plan - 09/15/18 1327    Clinical Impression Statement  Pt is progressing well.  She is having a good response from MLD, but continues with fullness in her right axilla.  She has not received her compression bra yet and will need more compression at axilla.  Initiated core stablaization exercise and taught dowel stretching to shoulder today.  Renewal sent     Comorbidities  past chemo with neuropathy and radiation to right upper quadrant     Examination-Activity Limitations  Locomotion Level;Reach Overhead;Transfers    PT Frequency  1x / week    PT Duration  4 weeks    PT Treatment/Interventions  ADLs/Self Care Home Management;Therapeutic activities;Manual lymph drainage;Manual techniques;Patient/family education;Orthotic Fit/Training;Therapeutic exercise;Balance training;Neuromuscular re-education;Gait training    PT Next Visit Plan  Assess compression bra once she gets it Review MLD to right breast, progress LE and core strength and balance exercise     Consulted and Agree with Plan of Care  Patient       Patient will benefit from skilled therapeutic intervention in order to improve the following deficits and impairments:  Abnormal gait, Increased fascial restricitons, Pain, Impaired sensation, Postural dysfunction, Decreased strength, Impaired UE functional use, Obesity, Increased edema, Difficulty walking  Visit Diagnosis: Aftercare following surgery for neoplasm - Plan: PT plan of care cert/re-cert  Abnormal posture - Plan: PT plan of care cert/re-cert  Muscle weakness (generalized) - Plan: PT plan of care cert/re-cert  Lymphedema, not elsewhere classified - Plan: PT plan of care cert/re-cert     Problem List Patient Active Problem List   Diagnosis Date Noted  . DM (diabetes mellitus), type 2 ([place]) 09/28/2017  . Febrile neutropenia ([place]) 09/26/2017  . Sepsis ([place]) 09/26/2017  . Diarrhea 09/26/2017  . Port-A-Cath in place 08/29/2017  . Malignant  neoplasm of upper-inner quadrant of right breast in female, estrogen receptor positive ([place]) 07/01/2017  . Mood disorder ([place]) 07/05/2013    Class: Acute   Donato Heinz. Owens Shark, PT  [place] Levo 09/15/2018, 2:28 PM  [place], [place], [address] Phone: [phone]   Fax:  [phone]  Name: Caitlin Cohen MRN: [id number] Date of Birth: 1965-04-21  PHYSICAL THERAPY DISCHARGE SUMMARY  Visits from Start of Care: 4  Current functional level related to goals / functional outcomes: unknown   Remaining deficits: unknown   Education / Equipment: Self care for lymphedema management  Plan: Patient agrees to discharge.  Patient goals were partially met. Patient is being discharged due to not returning since the last visit.  ?????    Maudry Diego, PT 12/10/18 12:52 PM

## 2018-09-18 ENCOUNTER — Telehealth: Payer: Self-pay

## 2018-09-18 NOTE — Telephone Encounter (Signed)
Faxed signed order back to [facility] at [phone], sent to HIM for scan to chart.

## 2018-09-22 ENCOUNTER — Telehealth: Payer: Self-pay | Admitting: *Deleted

## 2018-09-22 NOTE — Telephone Encounter (Signed)
Please let her know her back pain is possible related to Tamoxifen, but I do not have high suspicion it is related. I am OK to let her stop Tamoxifen for now, until her next appointment in July. I do agree with MRI scan to evaluate her back. Thanks   Truitt Merle MD

## 2018-09-22 NOTE — Telephone Encounter (Signed)
Pt called 09/21/18 with message about back pain x 5 wks & seeing ortho & being diagnosed with bone spurs.  She had injection last week & still hurting.  She is questioning whether tamoxifen may be contributing to pain.  Returned call to pt today & she states she has called her ortho & they refilled her tramadol & may want to do MRI since this has been an ongoing problem & injection has not helped.  Suggested trying to alternate tylenol & ibuprofen with tramadol but to make sure she is eating something to protect her stomach.  She states that this pain is effecting her sleep & everything else.  Informed that this would be discussed with DR Burr Medico to see what her thoughts are on the tamoxifen.

## 2018-09-23 NOTE — Telephone Encounter (Signed)
Informed pt OK to hold tamoxifen for now until next visit & Dr Ernestina Penna message repeated to her.  She states she will let us know about MRI scan.

## 2018-10-06 ENCOUNTER — Encounter (HOSPITAL_COMMUNITY): Payer: Self-pay | Admitting: Family Medicine

## 2018-10-06 ENCOUNTER — Other Ambulatory Visit: Payer: Self-pay

## 2018-10-06 ENCOUNTER — Ambulatory Visit (HOSPITAL_COMMUNITY)
Admission: EM | Admit: 2018-10-06 | Discharge: 2018-10-06 | Disposition: A | Discharge status: Home / Self Care | Payer: Medicaid Other | Attending: Family Medicine | Admitting: Family Medicine

## 2018-10-06 DIAGNOSIS — K5903 Drug induced constipation: Secondary | ICD-10-CM

## 2018-10-06 MED ORDER — POLYETHYLENE GLYCOL 3350 17 G PO PACK: 17.0000 g | PACK | Freq: Every day | ORAL | 8 refills | Status: AC | PRN

## 2018-10-06 NOTE — ED Provider Notes (Signed)
[place]    CSN: [id number] Arrival date & time: 10/06/18  1507     History   Chief Complaint Chief Complaint  Patient presents with  . Constipation    HPI Caitlin Cohen is a 53 y.o. female.   53 yo established [place] patient with constipation and back pain.  She has been on increased pain meds for her back. No BM x 1 week.  Took corectal x 2 days without results.  Vomited last night  No fever.     Past Medical History:  Diagnosis Date  . Anemia   . Anxiety   . Cancer [facility])    breast cancer right  . Depression   . GERD (gastroesophageal reflux disease)   . Headache   . History of radiation therapy 11/10/17- 12/08/17   right breast, 40.05 Gy in 15 fractions. Right breast boost 10 Gy in 5 fractions.   . Personal history of chemotherapy   . Personal history of radiation therapy     Patient Active Problem List   Diagnosis Date Noted  . DM (diabetes mellitus), type 2 ([place]) 09/28/2017  . Febrile neutropenia ([place]) 09/26/2017  . Sepsis ([name]) 09/26/2017  . Diarrhea 09/26/2017  . Port-A-Cath in place 08/29/2017  . Malignant neoplasm of upper-inner quadrant of right breast in female, estrogen receptor positive ([place]) 07/01/2017  . Mood disorder ([place]) 07/05/2013    Class: Acute    Past Surgical History:  Procedure Laterality Date  . CERVICAL CONE BIOPSY    . CHOLECYSTECTOMY    . MASTECTOMY WITH RADIOACTIVE SEED GUIDED EXCISION AND AXILLARY SENTINEL LYMPH NODE BIOPSY Right 07/08/2017   Procedure: RIGHT LUMPTECTOMY WITH RADIOACTIVE SEED GUIDED EXCISION AND RIGHT AXILLARY SENTINEL LYMPH NODE BIOPSY ERAS PATHWAY;  Surgeon: [name], MD;  Location: [place];  Service: General;  Laterality: Right;  PEC BLOCK  . OPEN REDUCTION INTERNAL FIXATION (ORIF) DISTAL RADIAL FRACTURE  03/17/2012   Procedure: OPEN REDUCTION INTERNAL FIXATION (ORIF) DISTAL RADIAL FRACTURE;  Surgeon: Roseanne Kaufman, MD;  Location: [place];  Service: Orthopedics;  Laterality: Right;   Pre-operative a supra-clavical block right arm in addition to general anesthesia  . PORT-A-CATH REMOVAL Right 02/02/2018   Procedure: REMOVAL PORT-A-CATH;  Surgeon: [name], MD;  Location: [place];  Service: General;  Laterality: Right;  . PORTACATH PLACEMENT N/A 07/30/2017   Procedure: INSERTION PORT-A-CATH WITH [place];  Surgeon: [name], MD;  Location: [place];  Service: General;  Laterality: N/A;  . TUBAL LIGATION      OB History    Gravida  3   Para      Term      Preterm      AB      Living  3     SAB      TAB      Ectopic      Multiple      Live Births  3            Home Medications    Prior to Admission medications   Medication Sig Start Date End Date Taking? Authorizing Provider  gabapentin (NEURONTIN) 100 MG capsule Take 1 capsule in the morning and 3 capsules at bedtime 08/11/18   [name], NP  ibuprofen (ADVIL,MOTRIN) 200 MG tablet Take 400 mg by mouth daily as needed for mild pain or moderate pain.    [provider]  metFORMIN (GLUCOPHAGE) 500 MG tablet Take 1 tablet (500 mg total) by mouth 2 (two) times  daily with a meal. 06/19/18   Truitt Merle, MD  polyethylene glycol (MIRALAX) 17 g packet Take 17 g by mouth daily as needed. 10/06/18   Robyn Haber, MD  tamoxifen (NOLVADEX) 20 MG tablet Take 1 tablet (20 mg total) by mouth daily. 06/19/18   Truitt Merle, MD  traMADol (ULTRAM) 50 MG tablet Take by mouth 3 (three) times daily.    [provider]    Family History Family History  Problem Relation Age of Onset  . Hypertension Mother   . Diabetes Father   . Hypertension Father     Social History Social History   Tobacco Use  . Smoking status: Never Smoker  . Smokeless tobacco: Never Used  Substance Use Topics  . Alcohol use: Yes    Comment: occassionally  . Drug use: No     Allergies   Patient has no known allergies.   Review of Systems Review of Systems  Constitutional: Negative.    Gastrointestinal: Positive for constipation and vomiting.     Physical Exam Triage Vital Signs ED Triage Vitals  Enc Vitals Group     BP      Pulse      Resp      Temp      Temp src      SpO2      Weight      Height      Head Circumference      Peak Flow      Pain Score      Pain Loc      Pain Edu?      Excl. in [place]?    No data found.  Updated Vital Signs BP (!) 179/110 (BP Location: Left Arm)   Pulse 98   Temp 98.1 F (36.7 C) (Oral)   Resp 18   SpO2 96%    Physical Exam Vitals signs and nursing note reviewed.  Constitutional:      Appearance: Normal appearance. She is obese.  Eyes:     Conjunctiva/sclera: Conjunctivae normal.  Neck:     Musculoskeletal: Normal range of motion and neck supple.  Cardiovascular:     Rate and Rhythm: Normal rate.  Pulmonary:     Effort: Pulmonary effort is normal.  Abdominal:     General: Bowel sounds are normal. There is no distension.     Palpations: There is no mass.     Tenderness: There is no abdominal tenderness.  Musculoskeletal: Normal range of motion.  Skin:    General: Skin is warm and dry.  Neurological:     General: No focal deficit present.     Mental Status: She is alert and oriented to person, place, and time.  Psychiatric:        Mood and Affect: Mood normal.      UC Treatments / Results  Labs (all labs ordered are listed, but only abnormal results are displayed) Labs Reviewed - No data to display  EKG None  Radiology No results found.  Procedures Procedures (including critical care time)  Medications Ordered in UC Medications - No data to display  Initial Impression / Assessment and Plan / UC Course  I have reviewed the triage vital signs and the nursing notes.  Pertinent labs & imaging results that were available during my care of the patient were reviewed by me and considered in my medical decision making (see chart for details).    Final Clinical Impressions(s) / UC Diagnoses    Final diagnoses:  Drug-induced constipation   Discharge Instructions   None    ED Prescriptions    Medication Sig Dispense Auth. Provider   polyethylene glycol (MIRALAX) 17 g packet Take 17 g by mouth daily as needed. 7 each Robyn Haber, MD     Controlled Substance Prescriptions [place] Controlled Substance Registry consulted? Not Applicable   Robyn Haber, MD 10/06/18 1600

## 2018-10-06 NOTE — ED Triage Notes (Signed)
Pt here for constipation; pt has been taking increased amount of pain meds for back pain

## 2018-10-16 ENCOUNTER — Encounter: Payer: Self-pay | Admitting: Hematology

## 2018-10-19 ENCOUNTER — Other Ambulatory Visit: Payer: Self-pay | Admitting: Hematology

## 2018-10-19 ENCOUNTER — Telehealth: Payer: Self-pay | Admitting: Nurse Practitioner

## 2018-10-19 DIAGNOSIS — C50211 Malignant neoplasm of upper-inner quadrant of right female breast: Secondary | ICD-10-CM

## 2018-10-19 NOTE — Telephone Encounter (Signed)
I called patient in response to her mychart message regarding MRI result and possible metastasis. We received report from Dr. Nelva Bush. I explained that Dr. Burr Medico is out of the office and I will review with her upon her return on 10/21/18. I will let her know if/what additional testing we recommend. She understands. She is in significant pain, now taking hydrocodone or oxycodone 2-3 times per day. She is constipated, requiring miralax, stool softener. Mag citrate was not effective. Occasionally constipation induces vomiting. I encouraged her to continue bowel regimen can increase miralax to TID, to drink water, to remain mobile. She understands. Will review the above with Dr. Burr Medico. She has f/u with MD on 7/10, I explained if further work up is pending we may push to visit until after results are back, she understands and appreciates the call.  [name], NP  10/19/2018

## 2018-10-21 ENCOUNTER — Other Ambulatory Visit: Payer: Self-pay | Admitting: Hematology

## 2018-10-21 ENCOUNTER — Encounter: Payer: Self-pay | Admitting: Nurse Practitioner

## 2018-10-21 ENCOUNTER — Telehealth: Payer: Self-pay | Admitting: Hematology

## 2018-10-21 ENCOUNTER — Telehealth: Payer: Self-pay

## 2018-10-21 DIAGNOSIS — C50211 Malignant neoplasm of upper-inner quadrant of right female breast: Secondary | ICD-10-CM

## 2018-10-21 NOTE — Telephone Encounter (Signed)
R/s appt per 7/08 sch message - pt aware of appt date and time

## 2018-10-21 NOTE — Telephone Encounter (Signed)
Patient calls inquiring about what the plan will be to find out if her cancer has spread to her spine.  I explained to her that the next step will be a PET scan, we are waiting for approval.  She informed me that she also has [employer], I obtained the information and passed this on to [employer] for PA.

## 2018-10-22 ENCOUNTER — Other Ambulatory Visit: Payer: Self-pay | Admitting: Nurse Practitioner

## 2018-10-22 ENCOUNTER — Encounter: Payer: Self-pay | Admitting: Nurse Practitioner

## 2018-10-22 DIAGNOSIS — R110 Nausea: Secondary | ICD-10-CM

## 2018-10-22 DIAGNOSIS — M549 Dorsalgia, unspecified: Secondary | ICD-10-CM

## 2018-10-22 DIAGNOSIS — G8929 Other chronic pain: Secondary | ICD-10-CM

## 2018-10-22 MED ORDER — OXYCODONE-ACETAMINOPHEN 10-325 MG PO TABS: 1.0000 | ORAL_TABLET | Freq: Four times a day (QID) | ORAL | 0 refills | Status: DC | PRN

## 2018-10-22 MED ORDER — ONDANSETRON HCL 8 MG PO TABS: 8.0000 mg | ORAL_TABLET | Freq: Three times a day (TID) | ORAL | 0 refills | Status: DC | PRN

## 2018-10-23 ENCOUNTER — Inpatient Hospital Stay: Payer: Medicaid Other | Admitting: Hematology

## 2018-10-23 ENCOUNTER — Inpatient Hospital Stay: Payer: Medicaid Other

## 2018-10-26 ENCOUNTER — Other Ambulatory Visit: Payer: Self-pay

## 2018-10-26 ENCOUNTER — Inpatient Hospital Stay: Payer: Medicaid Other | Attending: Hematology

## 2018-10-26 ENCOUNTER — Encounter (HOSPITAL_COMMUNITY): Payer: Self-pay

## 2018-10-26 ENCOUNTER — Ambulatory Visit (HOSPITAL_COMMUNITY): Payer: Medicaid Other

## 2018-10-26 DIAGNOSIS — F419 Anxiety disorder, unspecified: Secondary | ICD-10-CM | POA: Diagnosis not present

## 2018-10-26 DIAGNOSIS — Z170 Estrogen receptor positive status [ER+]: Secondary | ICD-10-CM | POA: Insufficient documentation

## 2018-10-26 DIAGNOSIS — R911 Solitary pulmonary nodule: Secondary | ICD-10-CM | POA: Insufficient documentation

## 2018-10-26 DIAGNOSIS — M899 Disorder of bone, unspecified: Secondary | ICD-10-CM | POA: Insufficient documentation

## 2018-10-26 DIAGNOSIS — C50211 Malignant neoplasm of upper-inner quadrant of right female breast: Secondary | ICD-10-CM | POA: Insufficient documentation

## 2018-10-26 DIAGNOSIS — M549 Dorsalgia, unspecified: Secondary | ICD-10-CM | POA: Diagnosis not present

## 2018-10-26 DIAGNOSIS — F329 Major depressive disorder, single episode, unspecified: Secondary | ICD-10-CM | POA: Diagnosis not present

## 2018-10-26 LAB — CMP (CANCER CENTER ONLY)
ALT: 13 U/L (ref 0–44)
AST: 15 U/L (ref 15–41)
Albumin: 3.6 g/dL (ref 3.5–5.0)
Alkaline Phosphatase: 82 U/L (ref 38–126)
Anion gap: 9 (ref 5–15)
BUN: 13 mg/dL (ref 6–20)
CO2: 26 mmol/L (ref 22–32)
Calcium: 9.1 mg/dL (ref 8.9–10.3)
Chloride: 106 mmol/L (ref 98–111)
Creatinine: 0.83 mg/dL (ref 0.44–1.00)
GFR, Est AFR Am: >60 mL/min (ref >60)
GFR, Estimated: >60 mL/min (ref >60)
Glucose, Bld: 109 mg/dL — ABNORMAL HIGH (ref 70–99)
Potassium: 4 mmol/L (ref 3.5–5.1)
Sodium: 141 mmol/L (ref 135–145)
Total Bilirubin: <0.2 mg/dL — ABNORMAL LOW (ref 0.3–1.2)
Total Protein: 7.6 g/dL (ref 6.5–8.1)

## 2018-10-26 LAB — CBC WITH DIFFERENTIAL (CANCER CENTER ONLY)
Abs Immature Granulocytes: 0.02 10*3/uL (ref 0.00–0.07)
Basophils Absolute: 0 10*3/uL (ref 0.0–0.1)
Basophils Relative: 1 %
Eosinophils Absolute: 0.1 10*3/uL (ref 0.0–0.5)
Eosinophils Relative: 1 %
HCT: 41.2 % (ref 36.0–46.0)
Hemoglobin: 13.3 g/dL (ref 12.0–15.0)
Immature Granulocytes: 0 %
Lymphocytes Relative: 29 %
Lymphs Abs: 2.2 10*3/uL (ref 0.7–4.0)
MCH: 27.9 pg (ref 26.0–34.0)
MCHC: 32.3 g/dL (ref 30.0–36.0)
MCV: 86.6 fL (ref 80.0–100.0)
Monocytes Absolute: 0.3 10*3/uL (ref 0.1–1.0)
Monocytes Relative: 3 %
Neutro Abs: 5.2 10*3/uL (ref 1.7–7.7)
Neutrophils Relative %: 66 %
Platelet Count: 253 10*3/uL (ref 150–400)
RBC: 4.76 MIL/uL (ref 3.87–5.11)
RDW: 13.8 % (ref 11.5–15.5)
WBC Count: 7.8 10*3/uL (ref 4.0–10.5)
nRBC: 0 % (ref 0.0–0.2)

## 2018-10-26 NOTE — Progress Notes (Signed)
[facility]   [phone] [phone]) [phone]   Clinic Follow up Note   Patient Care Team: Patient, No Pcp Per as PCP - General ([place]) Truitt Merle, MD as Consulting Physician (Hematology) [name], MD as Consulting Physician (General Surgery) [name], MD as Attending Physician (Radiation Oncology) [name], NP as Nurse Practitioner (Nurse Practitioner)  Date of Service:  10/29/2018  CHIEF COMPLAINT: Follow up of right breast cancer   SUMMARY OF ONCOLOGIC HISTORY: Oncology History Overview Note  Cancer Staging Malignant neoplasm of upper-inner quadrant of right breast in female, estrogen receptor positive ([place]) Staging form: Breast, AJCC 8th Edition - Clinical stage from 06/26/2017: Stage IIA (cT2, cN0, cM0, G3, ER: Positive, PR: Positive, HER2: Negative) - Signed by Truitt Merle, MD on 07/01/2017 - Pathologic stage from 07/08/2017: Stage IB (pT2, pN0, cM0, G3, ER+, PR+, HER2-, Oncotype DX score: 29) - Signed by [name], NP on 07/25/2017     Malignant neoplasm of upper-inner quadrant of right breast in female, estrogen receptor positive ([place])  06/23/2017 Mammogram   IMPRESSION: 1. There is a highly suspicious mass measuring 3.5 cm mammographically in the right breast at the palpable site identified by the patient. 2. There is 1 suspicious lymph node with cortex measuring up to 6 mm. 3.  No mammographic evidence of malignancy in the left breast.   06/26/2017 Initial Biopsy   Diagnosis 06/26/17 1. Breast, right, needle core biopsy, upper inner quadrant, 12:30 o'clock, 7cm from nipple - INVASIVE DUCTAL CARCINOMA - SEE COMMENT 2. Lymph node, needle/core biopsy, right axillary (level 2 node) - NO CARCINOMA IDENTIFIED IN ONE LYMPH NODE (0/1)   06/26/2017 Receptors her2   Prognostic indicators significant for: ER, 70% positive and PR, 70% positive, both with strong staining intensity. Proliferation marker Ki67 at 60%. HER2  negative.   07/01/2017 Initial Diagnosis   Malignant neoplasm of upper-inner quadrant of right breast in female, estrogen receptor positive ([place])   07/08/2017 Pathology Results   Diagnosis 1. Breast, lumpectomy, Right - INVASIVE DUCTAL CARCINOMA, NOTTINGHAM GRADE 3 OF 3, 3.5 CM - MARGINS UNINVOLVED BY CARCINOMA (0.1 CM, SUPERIOR MARGIN) - PREVIOUS BIOPSY SITE CHANGES - SEE ONCOLOGY TABLE BELOW 2. Soft tissue, biopsy, Axillary - BENIGN FIBROADIPOSE TISSUE - NO MALIGNANCY IDENTIFIED 3. Lymph node, sentinel, biopsy, Right axillary - NO CARCINOMA IDENTIFIED IN ONE LYMPH NODE (0/1) 4. Lymph node, sentinel, biopsy, Right axillary - NO CARCINOMA IDENTIFIED IN ONE LYMPH NODE (0/1) - PREVIOUS BIOPSY SITE CHANGES - SEE COMMENT 5. Breast, excision, Right superior margin - BENIGN BREAST TISSUE - NO RESIDUAL CARCINOMA IDENTIFIED 6. Breast, excision, Right inferior margin - BENIGN BREAST TISSUE - NO RESIDUAL CARCINOMA IDENTIFIED   07/08/2017 Cancer Staging   Staging form: Breast, AJCC 8th Edition - Pathologic stage from 07/08/2017: Stage IB (pT2, pN0, cM0, G3, ER+, PR+, HER2-, Oncotype DX score: 29) - Signed by [name], NP on 07/25/2017   07/22/2017 Oncotype testing   Recurrence Score: 29 Distant Recurrence Risk at 9 years with Ai or Tamoxifen alone: 18% Absolute Chemotherapy Benefit: > 15 %   08/08/2017 -  Chemotherapy   Adjuvant chemo Taxotere/Cytoxanq3 weeks x4 cycles with Udenyca -Granix added from cycle 2. Discontinued Udenyca following cycle 2.    11/10/2017 - 12/08/2017 Radiation Therapy   Radiation treatment dates:   11/10/2017 to 12/08/2017  Site/dose:    1. The Right breast was treated to 40.05 Gy in 15 fractions of 2.67 Gy. 2. The Right breast was boosted to  10 Gy in 5 fractions of 2 Gy.  Beams/energy:    1. 3D// 10X 2. Isodose plan w/photons // 10X, 6X  Narrative: The patient tolerated radiation treatment relatively well.   She developed moderate hyperpigmentation to  the right breast with treatment. She denies any pain or fatigue. She continues to use radiaplex twice daily as directed.    12/2017 -  Anti-estrogen oral therapy   started Tamoxifen 60m daily in 12/2017   08/11/2018 Survivorship   SCP virtual visit per LCira Rue NP    10/27/2018 Imaging   CT CAP W Contrast 10/27/18  IMPRESSION: 1. Multifocal lytic bone metastases are identified involving the left ribs, L2 vertebra, and right iliac wing. There is of pathologic fracture involving the L2 vertebra. 2. Single small, nonspecific pulmonary nodule in the right upper lobe is noted. 3. Ill-defined low-density structure in right lobe of liver measures 1.3 cm and is indeterminate. This may be better characterized with contrast enhanced liver MRI or PET-CT.   10/28/2018 Imaging   Whole Body Bone Scan 10/28/18  IMPRESSION: Sites of uptake in the left third and sixth ribs, right iliac crest and L2 vertebral body are compatible with sites of osseous metastatic disease with lesions visualized on CT from 1 day prior   Non-specific soft tissue uptake is seen at the site of prior right lumpectomy.   Likely urinary contamination in the soft tissues of the right pelvis, buttock and right flank.      CURRENT THERAPY:  Tamoxifen 297mdaily starting 12/2017  INTERVAL HISTORY:  KiAntonina Cohen here for a follow up right breast cancer. She presents to the clinic alone. She still has significant back pain. She has been taking Percocet but this is not controlling pain. She feels she may need walker or cane to help her ambulate. She does not have one.  She notes her back pain started in January and lasted for 3 weeks without going away. She went to Urgent Care for CT scan. She then went to Ortho with another scan that showed bone spurs. She was going to do therapy but due to COVID-19 it was canceled. Her back pain returned in March and tried to get back injections but this did not help her. The pain  progressed to excruciating level in June. She notes she feels better when she lays down. She is concern about work as her current pain is hindering her from doing so.   She also notes heartburn and having frequent hiccups. She feels like food she swallows just sit on her stomach. She has not been eating as much due to this so she has lost weight. She notes her last period was 06/2017.    REVIEW OF SYSTEMS:   Constitutional: Denies fevers, chills  (+) Lower food intake, weight loss  Eyes: Denies blurriness of vision Ears, nose, mouth, throat, and face: Denies mucositis or sore throat Respiratory: Denies cough, dyspnea or wheezes Cardiovascular: Denies palpitation, chest discomfort or lower extremity swelling Gastrointestinal:  Denies nausea or change in bowel habits (+) heartburn (+) frequent hiccups Skin: Denies abnormal skin rashes MSK: (+) Significant back pain  Lymphatics: Denies new lymphadenopathy or easy bruising Neurological:Denies numbness, tingling or new weaknesses Behavioral/Psych: Mood is stable, no new changes  All other systems were reviewed with the patient and are negative.  MEDICAL HISTORY:  Past Medical History:  Diagnosis Date   Anemia    Anxiety    Cancer (HCPrincetondx'd 06/2017   breast cancer right  Depression    Diabetes mellitus without complication ([place])    GERD (gastroesophageal reflux disease)    Headache    History of radiation therapy 11/10/17- 12/08/17   right breast, 40.05 Gy in 15 fractions. Right breast boost 10 Gy in 5 fractions.    Personal history of chemotherapy    Personal history of radiation therapy     SURGICAL HISTORY: Past Surgical History:  Procedure Laterality Date   CERVICAL CONE BIOPSY     CHOLECYSTECTOMY     MASTECTOMY WITH RADIOACTIVE SEED GUIDED EXCISION AND AXILLARY SENTINEL LYMPH NODE BIOPSY Right 07/08/2017   Procedure: RIGHT LUMPTECTOMY WITH RADIOACTIVE SEED GUIDED EXCISION AND RIGHT AXILLARY SENTINEL LYMPH NODE  BIOPSY ERAS PATHWAY;  Surgeon: [name], MD;  Location: [facility];  Service: General;  Laterality: Right;  PEC BLOCK   OPEN REDUCTION INTERNAL FIXATION (ORIF) DISTAL RADIAL FRACTURE  03/17/2012   Procedure: OPEN REDUCTION INTERNAL FIXATION (ORIF) DISTAL RADIAL FRACTURE;  Surgeon: Roseanne Kaufman, MD;  Location: [facility];  Service: Orthopedics;  Laterality: Right;  Pre-operative a supra-clavical block right arm in addition to general anesthesia   PORT-A-CATH REMOVAL Right 02/02/2018   Procedure: REMOVAL PORT-A-CATH;  Surgeon: [name], MD;  Location: [facility];  Service: General;  Laterality: Right;   PORTACATH PLACEMENT N/A 07/30/2017   Procedure: INSERTION PORT-A-CATH WITH [place];  Surgeon: [name], MD;  Location: [facility];  Service: General;  Laterality: N/A;   TUBAL LIGATION      I have reviewed the social history and family history with the patient and they are unchanged from previous note.  ALLERGIES:  has No Known Allergies.  MEDICATIONS:  Current Outpatient Medications  Medication Sig Dispense Refill   gabapentin (NEURONTIN) 100 MG capsule Take 1 capsule in the morning and 3 capsules at bedtime 360 capsule 1   ibuprofen (ADVIL,MOTRIN) 200 MG tablet Take 400 mg by mouth daily as needed for mild pain or moderate pain.     metFORMIN (GLUCOPHAGE) 500 MG tablet Take 1 tablet (500 mg total) by mouth 2 (two) times daily with a meal. 180 tablet 1   ondansetron (ZOFRAN) 8 MG tablet Take 1 tablet (8 mg total) by mouth every 8 (eight) hours as needed for nausea or vomiting. 20 tablet 0   oxyCODONE-acetaminophen (PERCOCET) 10-325 MG tablet Take 1 tablet by mouth every 6 (six) hours as needed for pain. 30 tablet 0   polyethylene glycol (MIRALAX) 17 g packet Take 17 g by mouth daily as needed. 14 each 8   tamoxifen (NOLVADEX) 20 MG tablet Take 1 tablet (20 mg total) by mouth daily. 90 tablet 3   morphine (MS CONTIN) 30 MG 12 hr tablet Take 1 tablet (30 mg  total) by mouth every 12 (twelve) hours. 60 tablet 0   oxyCODONE (OXY IR/ROXICODONE) 5 MG immediate release tablet Take 1-2 tablets (5-10 mg total) by mouth every 6 (six) hours as needed for severe pain. 90 tablet 0   No current facility-administered medications for this visit.     PHYSICAL EXAMINATION: ECOG PERFORMANCE STATUS: 3 - Symptomatic, >50% confined to bed  Vitals:   10/29/18 1030  BP: (!) 149/95  Pulse: 100  Resp: 20  Temp: 97.7 F (36.5 C)  SpO2: 95%   Filed Weights   10/29/18 1030  Weight: 299 lb 11.2 oz (135.9 kg)    GENERAL:alert, no distress and comfortable SKIN: skin color, texture, turgor are normal, no rashes or significant lesions EYES: normal, Conjunctiva are pink and non-injected, sclera  clear  NECK: supple, thyroid normal size, non-tender, without nodularity LYMPH:  no palpable lymphadenopathy in the cervical, axillary  LUNGS: clear to auscultation and percussion with normal breathing effort HEART: regular rate & rhythm and no murmurs and no lower extremity edema ABDOMEN:abdomen soft, non-tender and normal bowel sounds Musculoskeletal:no cyanosis of digits and no clubbing  NEURO: alert & oriented x 3 with fluent speech, no focal motor/sensory deficits  LABORATORY DATA:  I have reviewed the data as listed CBC Latest Ref Rng & Units 10/26/2018 06/19/2018 01/16/2018  WBC 4.0 - 10.5 K/uL 7.8 6.4 7.2  Hemoglobin 12.0 - 15.0 g/dL 13.3 11.7(L) 12.1  Hematocrit 36.0 - 46.0 % 41.2 36.8 37.1  Platelets 150 - 400 K/uL 253 240 273     CMP Latest Ref Rng & Units 10/26/2018 06/19/2018 01/16/2018  Glucose 70 - 99 mg/dL 109(H) 92 122(H)  BUN 6 - 20 mg/dL _0 Creatinine 0.44 - 1.00 mg/dL 0.83 0.92 0.80  Sodium 135 - 145 mmol/L 141 141 141  Potassium 3.5 - 5.1 mmol/L 4.0 3.5 3.5  Chloride 98 - 111 mmol/L 106 108 107  CO2 22 - 32 mmol/L _1 Calcium 8.9 - 10.3 mg/dL 9.1 8.9 8.9  Total Protein 6.5 - 8.1 g/dL 7.6 7.2 7.4  Total Bilirubin 0.3 - 1.2 mg/dL  <0.2(L) 0.6 0.3  Alkaline Phos 38 - 126 U/L 82 57 63  AST 15 - 41 U/L _2 ALT 0 - 44 U/L _3 RADIOGRAPHIC STUDIES: I have personally reviewed the radiological images as listed and agreed with the findings in the report. Nm Bone Scan Whole Body  Result Date: 10/28/2018 CLINICAL DATA:  Breast cancer restaging, symptomatic bone pain with intermittent lower back pain of 7 months. EXAM: NUCLEAR MEDICINE WHOLE BODY BONE SCAN TECHNIQUE: Whole body anterior and posterior images were obtained approximately 3 hours after intravenous injection of radiopharmaceutical. RADIOPHARMACEUTICALS:  21.5 mCi Technetium-35mMDP IV COMPARISON:  CT chest, abdomen, pelvis October 27, 2018 bone scan February 06, 1999 FINDINGS: Increased sites of osseous activity are present in the left anterolateral third and sixth ribs, right iliac crest, and L2 vertebral body with destructive osseous lesions seen in these locations the CT exam from day prior. Foci of activity in the shoulders, hands and feet have a degenerative distribution. Minimal nonspecific soft tissue uptake at the site of prior right lumpectomy. Expected radiotracer activity is seen within the bladder. Additional nonspecific activity in the soft tissues of the pelvis, right flank and right buttock. IMPRESSION: Sites of uptake in the left third and sixth ribs, right iliac crest and L2 vertebral body are compatible with sites of osseous metastatic disease with lesions visualized on CT from 1 day prior Non-specific soft tissue uptake is seen at the site of prior right lumpectomy. Likely urinary contamination in the soft tissues of the right pelvis, buttock and right flank. Electronically Signed   By: PLovena LeM.D.   On: 10/28/2018 20:35     ASSESSMENT & PLAN:  Caitlin Urbanowiczis a 53y.o. female with    1. Diffuse bone lesion, probably bone metastasis, severe back pain, L2 fracture   -Onset in 04/2018, initial mild and intermittent, severe pain for  4-6 weeks. -09/17/18 Injections with orthopedist Dr. RNelva Bushdid not help.  -Tramadol was not enough, now on Percocet 126m4-5 times per day. Her pain is not well controlled  -I will call in long acting  MS Contin 54m BID and oxycodone 5-167mto take q6hr prn for breakthrough pain.  -I discussed these narcotics can cause dependence. Once her pain improves, will wean her off or downgrade use. I advised her to lock them up and monitor her use.  -She still has Gabapentin, she can titrate up to 30048mID.   -Her 09/2018 MRI spine was concerning for bone metastasis and L2 spine fracture, likely pathologic.  -We personally reviewed and discussed her CT CAP from 10/27/18 and bone scan from 10/28/18 which show uptake in left 3rd and 6th ribs, right iliac crest and large 4.9cm lytic lesion in L2 with pathologic fracture.  -Scan also shows Single small, nonspecific pulmonary nodule in the right upper. There is Ill-defined low-density structure in right lobe of liver measures 1.3 cm and is indeterminate. Will get liver MRI when her pain is better controlled  -I recommend bone biopsy for definitive diagnosis.  -Given her pain and fracture, I will contact IR to see if she is a candidate for OsteoCool Procedure to help her pain and treat spine tumor lesions. If not, will consider palliative RT  -Will give prescription for 4 point cane to help her ambulate.  -I recommend she speak with her job's HR about medical leave while she undergoes target therapy. If pain and condition improves she can reassess returning.    2. 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 She is s/p right breast lumpectomy, adjuvant TC and adjuvant radiation.  -She started anti-estrogen therapy with Tamoxifen in 12/2017. Tolerating well with manageable hot flashes.  --Based on recent CT CAP, bone scan  she has bone lesions highly concerning for bone metastasis.  -Will obtain a bone biopsy in the next few weeks for definitive diagnosis.  -If positive for metastasis, she will have stage IV cancer that is no longer curable, but still treatable.  -She would stop Tamoxifen as she progressed while taking it. [place] is still premenopausal. Will start her on ovarian suppression and AI, with CDK4/6 inhibitor if her biopsy confirms metastatic breast cancer -May obtain [facility] results on biopsy sample to see if she is eligible for target or immunotherapy.   3. Depression/Anxiety  -She has a hx of depression and was previously treated with medication. Not currently on medication and she feels that her depression is controlled. -due to her probable metastatic disease, he is overwhelmed and crying during her visit, she has good support from her sister and children  -I offered her the chance to talk to our SW about her current state. She declined for now.    Plan -I prescribed cane, Oxycodone 5mg37mN and MS Contin 30mg79mH today  -Increase Gabapentin dose  -Scans reviewed, high suspicion for bone mets  -Bone biopsy in 1-2 weeks  -may order liver MRI on next visit  -F/u with virtual visit after the above    No problem-specific Assessment & Plan notes found for this encounter.   No orders of the defined types were placed in this encounter.  All questions were answered. The patient knows to call the clinic with any problems, questions or concerns. No barriers to learning was detected. I spent 40 minutes counseling the patient face to face. The total time spent in the appointment was 50 minutes and more than 50% was on counseling and review  of test results     Truitt Merle, MD 10/29/2018   I, Joslyn Devon, am acting as scribe for Truitt Merle, MD.   I have reviewed the above documentation for accuracy and completeness, and I agree with the above.

## 2018-10-27 ENCOUNTER — Encounter (HOSPITAL_COMMUNITY): Payer: Self-pay

## 2018-10-27 ENCOUNTER — Ambulatory Visit (HOSPITAL_COMMUNITY)
Admission: RE | Admit: 2018-10-27 | Discharge: 2018-10-27 | Disposition: A | Discharge status: Home / Self Care | Payer: BC Managed Care – PPO | Source: Ambulatory Visit | Attending: Hematology | Admitting: Hematology

## 2018-10-27 DIAGNOSIS — Z170 Estrogen receptor positive status [ER+]: Secondary | ICD-10-CM | POA: Diagnosis present

## 2018-10-27 DIAGNOSIS — C50211 Malignant neoplasm of upper-inner quadrant of right female breast: Secondary | ICD-10-CM | POA: Insufficient documentation

## 2018-10-27 HISTORY — DX: Type 2 diabetes mellitus without complications: E11.9

## 2018-10-27 LAB — ESTRADIOL: Estradiol: 19.2 pg/mL

## 2018-10-27 LAB — FOLLICLE STIMULATING HORMONE: FSH: 25.7 m[IU]/mL

## 2018-10-27 MED ORDER — IOHEXOL 300 MG/ML  SOLN
100.0000 mL | Freq: Once | INTRAMUSCULAR | Status: AC | PRN
  Administered 2018-10-27: 08:00:00 100 mL via INTRAVENOUS

## 2018-10-27 MED ORDER — SODIUM CHLORIDE (PF) 0.9 % IJ SOLN
INTRAMUSCULAR | Status: AC
  Filled 2018-10-27: qty 50

## 2018-10-28 ENCOUNTER — Ambulatory Visit (HOSPITAL_COMMUNITY)
Admission: RE | Admit: 2018-10-28 | Discharge: 2018-10-28 | Disposition: A | Discharge status: Home / Self Care | Payer: BC Managed Care – PPO | Source: Ambulatory Visit | Attending: Hematology | Admitting: Hematology

## 2018-10-28 ENCOUNTER — Other Ambulatory Visit: Payer: Self-pay

## 2018-10-28 ENCOUNTER — Other Ambulatory Visit (HOSPITAL_COMMUNITY): Payer: Self-pay | Admitting: Hematology

## 2018-10-28 ENCOUNTER — Ambulatory Visit
Admission: RE | Admit: 2018-10-28 | Discharge: 2018-10-28 | Disposition: A | Discharge status: Home / Self Care | Payer: Self-pay | Source: Ambulatory Visit | Attending: Hematology | Admitting: Hematology

## 2018-10-28 DIAGNOSIS — C50211 Malignant neoplasm of upper-inner quadrant of right female breast: Secondary | ICD-10-CM | POA: Diagnosis not present

## 2018-10-28 DIAGNOSIS — C801 Malignant (primary) neoplasm, unspecified: Secondary | ICD-10-CM

## 2018-10-28 DIAGNOSIS — Z170 Estrogen receptor positive status [ER+]: Secondary | ICD-10-CM | POA: Insufficient documentation

## 2018-10-28 MED ORDER — TECHNETIUM TC 99M MEDRONATE IV KIT
21.5000 | PACK | Freq: Once | INTRAVENOUS | Status: AC | PRN
  Administered 2018-10-28: 21.5 via INTRAVENOUS

## 2018-10-29 ENCOUNTER — Other Ambulatory Visit: Payer: Self-pay

## 2018-10-29 ENCOUNTER — Inpatient Hospital Stay (HOSPITAL_BASED_OUTPATIENT_CLINIC_OR_DEPARTMENT_OTHER): Payer: Medicaid Other | Admitting: Hematology

## 2018-10-29 ENCOUNTER — Encounter: Payer: Self-pay | Admitting: Hematology

## 2018-10-29 VITALS — BP 149/95 | HR 100 | Temp 97.7°F | Resp 20 | Ht 67.0 in | Wt 299.7 lb

## 2018-10-29 DIAGNOSIS — F329 Major depressive disorder, single episode, unspecified: Secondary | ICD-10-CM

## 2018-10-29 DIAGNOSIS — C50211 Malignant neoplasm of upper-inner quadrant of right female breast: Secondary | ICD-10-CM

## 2018-10-29 DIAGNOSIS — M549 Dorsalgia, unspecified: Secondary | ICD-10-CM

## 2018-10-29 DIAGNOSIS — R911 Solitary pulmonary nodule: Secondary | ICD-10-CM | POA: Diagnosis not present

## 2018-10-29 DIAGNOSIS — F419 Anxiety disorder, unspecified: Secondary | ICD-10-CM

## 2018-10-29 DIAGNOSIS — M899 Disorder of bone, unspecified: Secondary | ICD-10-CM | POA: Diagnosis not present

## 2018-10-29 DIAGNOSIS — Z170 Estrogen receptor positive status [ER+]: Secondary | ICD-10-CM

## 2018-10-29 MED ORDER — MORPHINE SULFATE ER 30 MG PO TBCR: 30.0000 mg | EXTENDED_RELEASE_TABLET | Freq: Two times a day (BID) | ORAL | 0 refills | Status: DC

## 2018-10-29 MED ORDER — OXYCODONE HCL 5 MG PO TABS: 5.0000 mg | ORAL_TABLET | Freq: Four times a day (QID) | ORAL | 0 refills | Status: DC | PRN

## 2018-10-29 NOTE — Addendum Note (Signed)
Addended by: Truitt Merle on: 10/29/2018 05:26 PM   Modules accepted: Orders

## 2018-10-30 ENCOUNTER — Ambulatory Visit: Payer: Medicaid Other | Admitting: Hematology

## 2018-10-30 ENCOUNTER — Telehealth: Payer: Self-pay | Admitting: Hematology

## 2018-10-30 ENCOUNTER — Other Ambulatory Visit: Payer: Self-pay

## 2018-10-30 ENCOUNTER — Telehealth: Payer: Self-pay

## 2018-10-30 DIAGNOSIS — Z170 Estrogen receptor positive status [ER+]: Secondary | ICD-10-CM

## 2018-10-30 DIAGNOSIS — C50211 Malignant neoplasm of upper-inner quadrant of right female breast: Secondary | ICD-10-CM

## 2018-10-30 DIAGNOSIS — C7951 Secondary malignant neoplasm of bone: Secondary | ICD-10-CM

## 2018-10-30 NOTE — Telephone Encounter (Signed)
Left voice message for Dr. Suella Broad to call Dr. Burr Medico on her cell phone regarding this patient.

## 2018-10-30 NOTE — Progress Notes (Signed)
IR

## 2018-10-30 NOTE — Telephone Encounter (Signed)
Scheduled appt per 7/16 los. ° °Spoke with patient and she is aware of her appt date and time. °

## 2018-11-02 ENCOUNTER — Telehealth: Payer: Self-pay

## 2018-11-02 NOTE — Telephone Encounter (Signed)
Left voice message with the patient that the MS Contin prior approval had been obtained, spoke with [facility] and they are getting that ready for her to pick.  Also let her know that someone should be contacting her for an IR Evaluation for possible Osteo kool procedure.

## 2018-11-03 ENCOUNTER — Ambulatory Visit
Admission: RE | Admit: 2018-11-03 | Discharge: 2018-11-03 | Disposition: A | Discharge status: Home / Self Care | Payer: BC Managed Care – PPO | Source: Ambulatory Visit | Attending: Hematology | Admitting: Hematology

## 2018-11-03 ENCOUNTER — Encounter: Payer: Self-pay | Admitting: *Deleted

## 2018-11-03 ENCOUNTER — Other Ambulatory Visit: Payer: Self-pay

## 2018-11-03 DIAGNOSIS — C7951 Secondary malignant neoplasm of bone: Secondary | ICD-10-CM

## 2018-11-03 HISTORY — PX: IR RADIOLOGIST EVAL & MGMT: IMG5224

## 2018-11-03 NOTE — Consult Note (Signed)
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 female with past medical history significant for diabetes, anemia and metastatic breast cancer who is seen today in consultation via telemedicine for evaluation of candidacy for potential OsteoCool procedure related to metastatic disease involving the L2 vertebral body.  Patient states that prior to the onset of metastatic disease she has had little to no back pain.  Unfortunately, her back pain has been poorly managed despite aggressive pain medication regimen.  Patient was seen by Dr. Rolena Infante of [facility] yesterday who, per patient report is arranging for the patient to undergo presumed fluoroscopic guided biopsy and cement augmentation.  Past Medical History:  Diagnosis Date   Anemia    Anxiety    Cancer ([place]) dx'd 06/2017   breast cancer right   Depression    Diabetes mellitus without complication (HCC)    GERD (gastroesophageal reflux disease)    Headache    History of radiation therapy 11/10/17- 12/08/17   right breast, 40.05 Gy in 15 fractions. Right breast boost 10 Gy in 5 fractions.    Personal history of chemotherapy    Personal history of radiation therapy     Past Surgical History:  Procedure Laterality Date   CERVICAL CONE BIOPSY     CHOLECYSTECTOMY     MASTECTOMY WITH RADIOACTIVE SEED GUIDED EXCISION AND AXILLARY SENTINEL LYMPH NODE BIOPSY Right 07/08/2017   Procedure: RIGHT LUMPTECTOMY WITH RADIOACTIVE SEED GUIDED EXCISION AND RIGHT AXILLARY SENTINEL LYMPH NODE BIOPSY ERAS PATHWAY;  Surgeon: [name], MD;  Location: [place];  Service: General;   Laterality: Right;  PEC BLOCK   OPEN REDUCTION INTERNAL FIXATION (ORIF) DISTAL RADIAL FRACTURE  03/17/2012   Procedure: OPEN REDUCTION INTERNAL FIXATION (ORIF) DISTAL RADIAL FRACTURE;  Surgeon: Roseanne Kaufman, MD;  Location: [facility];  Service: Orthopedics;  Laterality: Right;  Pre-operative a supra-clavical block right arm in addition to general anesthesia   PORT-A-CATH REMOVAL Right 02/02/2018   Procedure: REMOVAL PORT-A-CATH;  Surgeon: [name], MD;  Location: [place];  Service: General;  Laterality: Right;   PORTACATH PLACEMENT N/A 07/30/2017   Procedure: INSERTION PORT-A-CATH WITH [place];  Surgeon: [name], MD;  Location: [facility];  Service: General;  Laterality: N/A;   TUBAL LIGATION      Allergies: Patient has no known allergies.  Medications: Prior to Admission medications   Medication Sig Start Date End Date Taking? Authorizing Provider  gabapentin (NEURONTIN) 100 MG capsule Take 1 capsule in the morning and 3 capsules at bedtime 08/11/18   [name], NP  ibuprofen (ADVIL,MOTRIN) 200 MG tablet Take 400 mg by mouth daily as needed for mild pain or moderate pain.    [provider]  metFORMIN (GLUCOPHAGE) 500 MG tablet Take 1 tablet (500 mg total) by mouth 2 (two) times daily with a meal. 06/19/18   Truitt Merle, MD  morphine (MS CONTIN) 30 MG 12 hr tablet Take 1 tablet (30 mg total) by mouth every 12 (twelve) hours. 10/29/18   Truitt Merle, MD  ondansetron (ZOFRAN) 8 MG tablet Take 1 tablet (8 mg total) by mouth every 8 (eight) hours as needed for nausea or vomiting. 10/22/18   [name], NP  oxyCODONE (OXY  IR/ROXICODONE) 5 MG immediate release tablet Take 1-2 tablets (5-10 mg total) by mouth every 6 (six) hours as needed for severe pain. 10/29/18   Truitt Merle, MD  oxyCODONE-acetaminophen (PERCOCET) 10-325 MG tablet Take 1 tablet by mouth every 6 (six) hours as needed for pain. 10/22/18   [name], NP  polyethylene glycol (MIRALAX) 17 g packet  Take 17 g by mouth daily as needed. 10/06/18   Robyn Haber, MD  tamoxifen (NOLVADEX) 20 MG tablet Take 1 tablet (20 mg total) by mouth daily. 06/19/18   Truitt Merle, MD     Family History  Problem Relation Age of Onset   Hypertension Mother    Diabetes Father    Hypertension Father     Social History   Socioeconomic History   Marital status: Single    Spouse name: Not on file   Number of children: Not on file   Years of education: Not on file   Highest education level: Not on file  Occupational History   Not on file  Social Needs   Financial resource strain: Not on file   Food insecurity    Worry: Not on file    Inability: Not on file   Transportation needs    Medical: No    Non-medical: No  Tobacco Use   Smoking status: Never Smoker   Smokeless tobacco: Never Used  Substance and Sexual Activity   Alcohol use: Yes    Comment: occassionally   Drug use: No   Sexual activity: Yes    Birth control/protection: Surgical  Lifestyle   Physical activity    Days per week: 2 days    Minutes per session: 30 min   Stress: Only a little  Relationships   Social connections    Talks on phone: More than three times a week    Gets together: Once a week    Attends religious service: Never    Active member of club or organization: No    Attends meetings of clubs or organizations: Never    Relationship status: Never married  Other Topics Concern   Not on file  Social History Narrative   Lives with roommate   Works fulltime but seasonal work    ECOG Status: 2 - Symptomatic, <50% confined to bed  Review of Systems  Review of Systems: A 12 point ROS discussed and pertinent positives are indicated in the HPI above.  All other systems are negative.  Physical Exam No direct physical exam was performed (except for noted visual exam findings with Video Visits).   Vital Signs: There were no vitals taken for this visit.  Imaging: Ct Chest W  Contrast  Result Date: 10/27/2018 CLINICAL DATA:  Metastatic breast cancer. EXAM: CT CHEST, ABDOMEN, AND PELVIS WITH CONTRAST TECHNIQUE: Multidetector CT imaging of the chest, abdomen and pelvis was performed following the standard protocol during bolus administration of intravenous contrast. CONTRAST:  174mL OMNIPAQUE IOHEXOL 300 MG/ML  SOLN COMPARISON:  None FINDINGS: CT CHEST FINDINGS Cardiovascular: The heart size appears normal. No pericardial effusion. Mediastinum/Nodes: Normal appearance of the thyroid gland. The trachea appears patent and is midline. Normal appearance of the esophagus. No enlarged axillary or supraclavicular lymph nodes. No mediastinal or hilar adenopathy. Lungs/Pleura: No pleural effusion. 2 mm right upper lobe lung nodule is identified, nonspecific, image 42/6. Musculoskeletal: Multifocal bone metastases are identified. -index expansile lytic lesion involving the anterior aspect of the left third rib measures 3.2 x 1.5 cm. -expansile lytic lesion involving the lateral  aspect of the left 6 rib measures 3.3 x 1.7 cm, image 92/6. CT ABDOMEN PELVIS FINDINGS Hepatobiliary: Ill-defined low-density structure in right lobe of liver measures 1.3 cm, image 54/2. Indeterminate. Previous cholecystectomy. No biliary dilatation. Pancreas: Unremarkable. No pancreatic ductal dilatation or surrounding inflammatory changes. Spleen: Normal in size without focal abnormality. Adrenals/Urinary Tract: Normal appearance of the adrenal glands. The right kidney is normal. Stone within inferior pole of left kidney measures 6 mm, image 72/2. The urinary bladder is normal. Stomach/Bowel: Small hiatal hernia. No evidence of bowel wall thickening, distension or inflammation. The appendix appears normal. Vascular/Lymphatic: Normal appearance of the abdominal aorta. No aneurysm. No abdominal or pelvic adenopathy. Reproductive: Partially calcified uterine fibroid noted. Other: No ascites or focal fluid collections.  Musculoskeletal: -Within the right iliac wing there is a lytic lesion measuring 3.3 cm, image 106/2. -large lytic lesion involving the L2 vertebra is identified. This measures 4.9 cm and extends into the posterior elements on the right, image 70/2. Secondary pathologic fracture of the L2 vertebra is identified. There is mild canal encroachment on the right, image 68/2. IMPRESSION: 1. Multifocal lytic bone metastases are identified involving the left ribs, L2 vertebra, and right iliac wing. There is of pathologic fracture involving the L2 vertebra. 2. Single small, nonspecific pulmonary nodule in the right upper lobe is noted. 3. Ill-defined low-density structure in right lobe of liver measures 1.3 cm and is indeterminate. This may be better characterized with contrast enhanced liver MRI or PET-CT. Electronically Signed   By: Kerby Moors M.D.   On: 10/27/2018 09:50   Nm Bone Scan Whole Body  Result Date: 10/28/2018 CLINICAL DATA:  Breast cancer restaging, symptomatic bone pain with intermittent lower back pain of 7 months. EXAM: NUCLEAR MEDICINE WHOLE BODY BONE SCAN TECHNIQUE: Whole body anterior and posterior images were obtained approximately 3 hours after intravenous injection of radiopharmaceutical. RADIOPHARMACEUTICALS:  21.5 mCi Technetium-52m MDP IV COMPARISON:  CT chest, abdomen, pelvis October 27, 2018 bone scan February 06, 1999 FINDINGS: Increased sites of osseous activity are present in the left anterolateral third and sixth ribs, right iliac crest, and L2 vertebral body with destructive osseous lesions seen in these locations the CT exam from day prior. Foci of activity in the shoulders, hands and feet have a degenerative distribution. Minimal nonspecific soft tissue uptake at the site of prior right lumpectomy. Expected radiotracer activity is seen within the bladder. Additional nonspecific activity in the soft tissues of the pelvis, right flank and right buttock. IMPRESSION: Sites of uptake in the  left third and sixth ribs, right iliac crest and L2 vertebral body are compatible with sites of osseous metastatic disease with lesions visualized on CT from 1 day prior Non-specific soft tissue uptake is seen at the site of prior right lumpectomy. Likely urinary contamination in the soft tissues of the right pelvis, buttock and right flank. Electronically Signed   By: Lovena Le M.D.   On: 10/28/2018 20:35   Ct Abdomen Pelvis W Contrast  Result Date: 10/27/2018 CLINICAL DATA:  Metastatic breast cancer. EXAM: CT CHEST, ABDOMEN, AND PELVIS WITH CONTRAST TECHNIQUE: Multidetector CT imaging of the chest, abdomen and pelvis was performed following the standard protocol during bolus administration of intravenous contrast. CONTRAST:  169mL OMNIPAQUE IOHEXOL 300 MG/ML  SOLN COMPARISON:  None FINDINGS: CT CHEST FINDINGS Cardiovascular: The heart size appears normal. No pericardial effusion. Mediastinum/Nodes: Normal appearance of the thyroid gland. The trachea appears patent and is midline. Normal appearance of the esophagus. No enlarged axillary or  supraclavicular lymph nodes. No mediastinal or hilar adenopathy. Lungs/Pleura: No pleural effusion. 2 mm right upper lobe lung nodule is identified, nonspecific, image 42/6. Musculoskeletal: Multifocal bone metastases are identified. -index expansile lytic lesion involving the anterior aspect of the left third rib measures 3.2 x 1.5 cm. -expansile lytic lesion involving the lateral aspect of the left 6 rib measures 3.3 x 1.7 cm, image 92/6. CT ABDOMEN PELVIS FINDINGS Hepatobiliary: Ill-defined low-density structure in right lobe of liver measures 1.3 cm, image 54/2. Indeterminate. Previous cholecystectomy. No biliary dilatation. Pancreas: Unremarkable. No pancreatic ductal dilatation or surrounding inflammatory changes. Spleen: Normal in size without focal abnormality. Adrenals/Urinary Tract: Normal appearance of the adrenal glands. The right kidney is normal. Stone within  inferior pole of left kidney measures 6 mm, image 72/2. The urinary bladder is normal. Stomach/Bowel: Small hiatal hernia. No evidence of bowel wall thickening, distension or inflammation. The appendix appears normal. Vascular/Lymphatic: Normal appearance of the abdominal aorta. No aneurysm. No abdominal or pelvic adenopathy. Reproductive: Partially calcified uterine fibroid noted. Other: No ascites or focal fluid collections. Musculoskeletal: -Within the right iliac wing there is a lytic lesion measuring 3.3 cm, image 106/2. -large lytic lesion involving the L2 vertebra is identified. This measures 4.9 cm and extends into the posterior elements on the right, image 70/2. Secondary pathologic fracture of the L2 vertebra is identified. There is mild canal encroachment on the right, image 68/2. IMPRESSION: 1. Multifocal lytic bone metastases are identified involving the left ribs, L2 vertebra, and right iliac wing. There is of pathologic fracture involving the L2 vertebra. 2. Single small, nonspecific pulmonary nodule in the right upper lobe is noted. 3. Ill-defined low-density structure in right lobe of liver measures 1.3 cm and is indeterminate. This may be better characterized with contrast enhanced liver MRI or PET-CT. Electronically Signed   By: Kerby Moors M.D.   On: 10/27/2018 09:50   Mr Outside Films Spine  Result Date: 10/28/2018 This examination belongs to an outside facility and is stored here for comparison purposes only.  Contact the originating outside institution for any associated report or interpretation.   Labs:  CBC: Recent Labs    12/05/17 1401 01/16/18 1016 06/19/18 0952 10/26/18 1101  WBC 5.6 7.2 6.4 7.8  HGB 11.8 12.1 11.7* 13.3  HCT 36.4 37.1 36.8 41.2  PLT 279 273 240 253    COAGS: No results for input(s): INR, APTT in the last 8760 hours.  BMP: Recent Labs    12/05/17 1401 01/16/18 1016 06/19/18 0952 10/26/18 1101  NA 142 141 141 141  K 3.3* 3.5 3.5 4.0  CL  107 107 108 106  CO2 27 27 26 26   GLUCOSE 119* 122* 92 109*  BUN 10 10 16 13   CALCIUM 9.2 8.9 8.9 9.1  CREATININE 0.80 0.80 0.92 0.83  GFRNONAA >60 >60 >60 >60  GFRAA >60 >60 >60 >60    LIVER FUNCTION TESTS: Recent Labs    12/05/17 1401 01/16/18 1016 06/19/18 0952 10/26/18 1101  BILITOT 0.4 0.3 0.6 <0.2*  AST 19 15 16 15   ALT 18 11 12 13   ALKPHOS 69 63 57 82  PROT 7.2 7.4 7.2 7.6  ALBUMIN 3.5 3.5 3.8 3.6    TUMOR MARKERS: No results for input(s): AFPTM, CEA, CA199, CHROMGRNA in the last 8760 hours.  Assessment and Plan:  Caitlin Cohen is a 53 y.o. female with past medical history significant for diabetes, anemia and metastatic breast cancer who is seen today in consultation via telemedicine for  evaluation of candidacy for potential OsteoCool procedure related to metastatic disease involving the L2 vertebral body.  CT scan of the chest, abdomen and pelvis performed 10/27/2018 as well as lumbar spine MRI performed 10/15/2018 was reviewed in detail.  Risks and benefits of image guided L2 vertebral body biopsy, ablation and cement augmentation was discussed with the patient including, but not limited to bleeding, infection, cement migration which may cause spinal cord damage and/or  paralysis.  All of the patient's questions were answered.  The patient was seen by Dr. Rolena Infante of [facility] yesterday who, per patient report, is arranging for the patient to undergo presumed fluoroscopic guided biopsy and cement augmentation next week.  Assuming the patient does undergo spinal intervention by Dr. Rolena Infante, I explained that that the patient could be a candidate for an OsteoCool procedure of the lytic lesion involving the anterior aspect of the right iliac wing if this lesion were to become symptomatic with improvement in her current over-riding back pain.  Patient demonstrated excellent understanding of the above conversation and knows to call the interventional radiology clinic  with any future questions or concerns.  Thank you for this interesting consult.  I greatly enjoyed meeting Caitlin Cohen and look forward to participating in their care.  A copy of this report was sent to the requesting provider on this date.  Electronically Signed: Sandi Mariscal 11/03/2018, 1:02 PM   I spent a total of 15 Minutes  in remote  clinical consultation, greater than 50% of which was counseling/coordinating care for L2 OsteoCool and Biopsy.    Visit type: Audio only (telephone). Audio (no video) only due to patient's lack of internet/smartphone capability. Alternative for in-person consultation at [facility], [address] [place], [place], [place]. This visit type was conducted due to national recommendations for restrictions regarding the COVID-19 Pandemic (e.g. social distancing).  This format is felt to be most appropriate for this patient at this time.  All issues noted in this document were discussed and addressed.

## 2018-11-04 ENCOUNTER — Telehealth: Payer: Self-pay

## 2018-11-04 NOTE — Telephone Encounter (Signed)
Faxed clearance for orthopedic surgery to Dr. Rolena Infante at [facility] [phone]  Attention Judeen Hammans

## 2018-11-04 NOTE — Telephone Encounter (Signed)
Left voice message for Caitlin Cohen in scheduling per Dr. Burr Medico please cancel her CT biopsy that is scheduled for tomorrow 7/23.  This will be obtained by her orthopedic surgeon when he does her surgery next week.  Left my direct number for her to call back if she has any questions.    Spoke with patient and informed her we are cancelling the CT biopsy for tomorrow and Dr. Rolena Infante will obtain biopsy specimen when he does her surgery next week.  She verbalized an understanding and appreciated the call.

## 2018-11-04 NOTE — Telephone Encounter (Signed)
Left message for Dr. Rolena Infante with [facility] to call Dr. Burr Medico on her cell phone regarding this patient.

## 2018-11-05 ENCOUNTER — Encounter (HOSPITAL_COMMUNITY): Payer: Self-pay

## 2018-11-05 ENCOUNTER — Ambulatory Visit (HOSPITAL_COMMUNITY): Admission: RE | Admit: 2018-11-05 | Payer: BC Managed Care – PPO | Source: Ambulatory Visit

## 2018-11-05 ENCOUNTER — Ambulatory Visit (HOSPITAL_COMMUNITY): Payer: BC Managed Care – PPO

## 2018-11-06 ENCOUNTER — Ambulatory Visit: Payer: Self-pay | Admitting: Orthopedic Surgery

## 2018-11-09 ENCOUNTER — Encounter (HOSPITAL_COMMUNITY): Payer: Self-pay

## 2018-11-09 ENCOUNTER — Encounter (HOSPITAL_COMMUNITY)
Admission: RE | Admit: 2018-11-09 | Discharge: 2018-11-09 | Disposition: A | Discharge status: Home / Self Care | Payer: BC Managed Care – PPO | Source: Ambulatory Visit | Attending: Orthopedic Surgery | Admitting: Orthopedic Surgery

## 2018-11-09 ENCOUNTER — Other Ambulatory Visit: Payer: Self-pay

## 2018-11-09 ENCOUNTER — Ambulatory Visit (HOSPITAL_COMMUNITY)
Admission: RE | Admit: 2018-11-09 | Discharge: 2018-11-09 | Disposition: A | Discharge status: Home / Self Care | Payer: BC Managed Care – PPO | Source: Ambulatory Visit | Attending: Orthopedic Surgery | Admitting: Orthopedic Surgery

## 2018-11-09 ENCOUNTER — Other Ambulatory Visit (HOSPITAL_COMMUNITY)
Admission: RE | Admit: 2018-11-09 | Discharge: 2018-11-09 | Disposition: A | Discharge status: Home / Self Care | Payer: BC Managed Care – PPO | Source: Ambulatory Visit | Attending: Orthopedic Surgery | Admitting: Orthopedic Surgery

## 2018-11-09 DIAGNOSIS — Z20828 Contact with and (suspected) exposure to other viral communicable diseases: Secondary | ICD-10-CM | POA: Diagnosis not present

## 2018-11-09 DIAGNOSIS — Z01818 Encounter for other preprocedural examination: Secondary | ICD-10-CM

## 2018-11-09 LAB — URINALYSIS, ROUTINE W REFLEX MICROSCOPIC
Bilirubin Urine: NEGATIVE
Glucose, UA: NEGATIVE mg/dL
Hgb urine dipstick: NEGATIVE
Ketones, ur: NEGATIVE mg/dL
Leukocytes,Ua: NEGATIVE
Nitrite: NEGATIVE
Protein, ur: NEGATIVE mg/dL
Specific Gravity, Urine: 1.015 (ref 1.005–1.030)
pH: 9 — ABNORMAL HIGH (ref 5.0–8.0)

## 2018-11-09 LAB — CBC
HCT: 39.3 % (ref 36.0–46.0)
Hemoglobin: 12.7 g/dL (ref 12.0–15.0)
MCH: 27.7 pg (ref 26.0–34.0)
MCHC: 32.3 g/dL (ref 30.0–36.0)
MCV: 85.8 fL (ref 80.0–100.0)
Platelets: 268 10*3/uL (ref 150–400)
RBC: 4.58 MIL/uL (ref 3.87–5.11)
RDW: 13.5 % (ref 11.5–15.5)
WBC: 9.4 10*3/uL (ref 4.0–10.5)
nRBC: 0 % (ref 0.0–0.2)

## 2018-11-09 LAB — BASIC METABOLIC PANEL
Anion gap: 11 (ref 5–15)
BUN: 10 mg/dL (ref 6–20)
CO2: 24 mmol/L (ref 22–32)
Calcium: 9.2 mg/dL (ref 8.9–10.3)
Chloride: 105 mmol/L (ref 98–111)
Creatinine, Ser: 0.89 mg/dL (ref 0.44–1.00)
GFR calc Af Amer: >60 mL/min (ref >60)
GFR calc non Af Amer: >60 mL/min (ref >60)
Glucose, Bld: 107 mg/dL — ABNORMAL HIGH (ref 70–99)
Potassium: 3.4 mmol/L — ABNORMAL LOW (ref 3.5–5.1)
Sodium: 140 mmol/L (ref 135–145)

## 2018-11-09 LAB — GLUCOSE, CAPILLARY: Glucose-Capillary: 113 mg/dL — ABNORMAL HIGH (ref 70–99)

## 2018-11-09 LAB — APTT: aPTT: 29 seconds (ref 24–36)

## 2018-11-09 LAB — PROTIME-INR
INR: 1.1 (ref 0.8–1.2)
Prothrombin Time: 14 seconds (ref 11.4–15.2)

## 2018-11-09 LAB — SURGICAL PCR SCREEN
MRSA, PCR: NEGATIVE
Staphylococcus aureus: POSITIVE — AB

## 2018-11-09 LAB — HEMOGLOBIN A1C
Hgb A1c MFr Bld: 5.7 % — ABNORMAL HIGH (ref 4.8–5.6)
Mean Plasma Glucose: 116.89 mg/dL

## 2018-11-09 NOTE — Pre-Procedure Instructions (Signed)
Caitlin Cohen  11/09/2018      [id number] - [place], [place] [address] [place] [address] Phone: [phone] Fax: [phone]   Your procedure is scheduled on November 12, 2018. Report to [facility] Admitting at 1030 AM. Call this number if you have problems the morning of surgery:  [phone]   Call [phone] if you have any questions prior to your surgery date Monday-Friday 8am-4pm    Remember:  Do not eat or drink after midnight.    Take these medicines the morning of surgery with A SIP OF WATER  Gabapentin (neurontin) Morphin (MS Contin) Ondansetron (Zofran)-if needed for nausea Oxycodone-if needed for pain  7 days prior to surgery STOP taking any Aspirin (unless otherwise instructed by your surgeon), Aleve, Naproxen, Ibuprofen, Motrin, Advil, Goody's, BC's, all herbal medications, fish oil, and all vitamins   WHAT DO I DO ABOUT MY DIABETES MEDICATION?  [name] Do not take oral diabetes medicines (pills) the morning of surgery-metformin (glucophage)  Reviewed and Endorsed by [facility] Patient Education Committee, August 2015    How to Manage Your Diabetes Before and After Surgery  Why is it important to control my blood sugar before and after surgery? . Improving blood sugar levels before and after surgery helps healing and can limit problems. . A way of improving blood sugar control is eating a healthy diet by: o  Eating less sugar and carbohydrates o  Increasing activity/exercise o  Talking with your doctor about reaching your blood sugar goals . High blood sugars (greater than 180 mg/dL) can raise your risk of infections and slow your recovery, so you will need to focus on controlling your diabetes during the weeks before surgery. . Make sure that the doctor who takes care of your diabetes knows about your planned surgery including the date and location.  How do  I manage my blood sugar before surgery? . Check your blood sugar at least 4 times a day, starting 2 days before surgery, to make sure that the level is not too high or low. o Check your blood sugar the morning of your surgery when you wake up and every 2 hours until you get to the Short Stay unit. . If your blood sugar is less than 70 mg/dL, you will need to treat for low blood sugar: o Do not take insulin. o Treat a low blood sugar (less than 70 mg/dL) with  cup of clear juice (cranberry or apple), 4 glucose tablets, OR glucose gel. Recheck blood sugar in 15 minutes after treatment (to make sure it is greater than 70 mg/dL). If your blood sugar is not greater than 70 mg/dL on recheck, call [phone] o  for further instructions. . Report your blood sugar to the short stay nurse when you get to Short Stay.  . If you are admitted to the hospital after surgery: o Your blood sugar will be checked by the staff and you will probably be given insulin after surgery (instead of oral diabetes medicines) to make sure you have good blood sugar levels. o The goal for blood sugar control after surgery is 80-180 mg/dL.   [facility]- Preparing For Surgery  Before surgery, you can play an important role. Because skin is not sterile, your skin needs to be as free of germs as possible. You can reduce the number of germs on your skin by washing with CHG (chlorahexidine gluconate)  Soap before surgery.  CHG is an antiseptic cleaner which kills germs and bonds with the skin to continue killing germs even after washing.    Oral Hygiene is also important to reduce your risk of infection.  Remember - BRUSH YOUR TEETH THE MORNING OF SURGERY WITH YOUR REGULAR TOOTHPASTE  Please do not use if you have an allergy to CHG or antibacterial soaps. If your skin becomes reddened/irritated stop using the CHG.  Do not shave (including legs and underarms) for at least 48 hours prior to first CHG shower. It is OK to shave your  face.  Please follow these instructions carefully.   1. Shower the NIGHT BEFORE SURGERY and the MORNING OF SURGERY with CHG.   2. If you chose to wash your hair, wash your hair first as usual with your normal shampoo.  3. After you shampoo, rinse your hair and body thoroughly to remove the shampoo.  4. Use CHG as you would any other liquid soap. You can apply CHG directly to the skin and wash gently with a scrungie or a clean washcloth.   5. Apply the CHG Soap to your body ONLY FROM THE NECK DOWN.  Do not use on open wounds or open sores. Avoid contact with your eyes, ears, mouth and genitals (private parts). Wash Face and genitals (private parts)  with your normal soap.  6. Wash thoroughly, paying special attention to the area where your surgery will be performed.  7. Thoroughly rinse your body with warm water from the neck down.  8. DO NOT shower/wash with your normal soap after using and rinsing off the CHG Soap.  9. Pat yourself dry with a CLEAN TOWEL.  10. Wear CLEAN PAJAMAS to bed the night before surgery, wear comfortable clothes the morning of surgery  11. Place CLEAN SHEETS on your bed the night of your first shower and DO NOT SLEEP WITH PETS.  Day of Surgery:  Do not apply any deodorants/lotions.  Please wear clean clothes to the hospital/surgery center.   Remember to brush your teeth WITH YOUR REGULAR TOOTHPASTE.   Do not wear jewelry, make-up or nail polish.  Do not wear lotions, powders, or perfumes, or deodorant.  Do not shave 48 hours prior to surgery.    Do not bring valuables to the hospital.  [facility] is not responsible for any belongings or valuables.  IF you are a smoker, DO NOT Smoke 24 hours prior to surgery   IF you wear a CPAP at night please bring your mask, tubing, and machine the morning of surgery    Remember that you must have someone to transport you home after your surgery, and remain with you for 24 hours if you are discharged the same  day.  Contacts, dentures or bridgework may not be worn into surgery.   For patients admitted to the hospital, discharge time will be determined by your treatment team.  Patients discharged the day of surgery will not be allowed to drive home.   Please read over the following fact sheets that you were given.

## 2018-11-09 NOTE — Progress Notes (Addendum)
PCP - Pt in between PCPs Dr.Fang acting as PCP Cardiologist - denies  Chest x-ray - 11/09/2018 at PAT appt EKG - 11/09/2018 at PAT appt  Stress Test - denies ECHO - denies  Cardiac Cath - denies  Sleep Study - pt denies CPAP - n/a  Fasting Blood Sugar - unsure Checks Blood Sugar _____ times a day- does not check at home  Aspirin Instructions:n/a Blood Thinner Instructions: n/a   Anesthesia review: pending labs, CXR, EKG  Patient denies shortness of breath, fever, cough and chest pain at PAT appointment  Patient verbalized understanding of instructions that were given to them at the PAT appointment. Patient was also instructed that they will need to review over the PAT instructions again at home before surgery.   Coronavirus Screening  Have you experienced the following symptoms:  Cough yes/no: No Fever (>100.25F)  yes/no: No Runny nose yes/no: No Sore throat yes/no: No Difficulty breathing/shortness of breath  yes/no: No  Have you or a family member traveled in the last 14 days and where? yes/no: No   If the patient indicates "YES" to the above questions, their PAT will be rescheduled to limit the exposure to others and, the surgeon will be notified. THE PATIENT WILL NEED TO BE ASYMPTOMATIC FOR 14 DAYS.   If the patient is not experiencing any of these symptoms, the PAT nurse will instruct them to NOT bring anyone with them to their appointment since they may have these symptoms or traveled as well.   Please remind your patients and families that hospital visitation restrictions are in effect and the importance of the restrictions.

## 2018-11-10 LAB — SARS CORONAVIRUS 2 (TAT 6-24 HRS): SARS Coronavirus 2: NEGATIVE

## 2018-11-11 MED ORDER — DEXTROSE 5 % IV SOLN
3.0000 g | INTRAVENOUS | Status: DC
  Filled 2018-11-11: qty 3000

## 2018-11-11 MED ORDER — DEXTROSE 5 % IV SOLN
3.0000 g | INTRAVENOUS | Status: AC
  Administered 2018-11-12: 3 g via INTRAVENOUS
  Filled 2018-11-11: qty 3

## 2018-11-12 ENCOUNTER — Telehealth: Payer: Self-pay | Admitting: Hematology

## 2018-11-12 ENCOUNTER — Observation Stay (HOSPITAL_COMMUNITY)
Admission: RE | Admit: 2018-11-12 | Discharge: 2018-11-13 | Disposition: A | Discharge status: Home / Self Care | Payer: BC Managed Care – PPO | Attending: Orthopedic Surgery | Admitting: Orthopedic Surgery

## 2018-11-12 ENCOUNTER — Ambulatory Visit (HOSPITAL_COMMUNITY): Payer: BC Managed Care – PPO | Admitting: Physician Assistant

## 2018-11-12 ENCOUNTER — Encounter (HOSPITAL_COMMUNITY)
Admission: RE | Disposition: A | Discharge status: Home / Self Care | Payer: Self-pay | Source: Home / Self Care | Attending: Orthopedic Surgery

## 2018-11-12 ENCOUNTER — Encounter (HOSPITAL_COMMUNITY): Payer: Self-pay

## 2018-11-12 ENCOUNTER — Ambulatory Visit (HOSPITAL_COMMUNITY): Payer: BC Managed Care – PPO

## 2018-11-12 ENCOUNTER — Other Ambulatory Visit: Payer: Self-pay

## 2018-11-12 ENCOUNTER — Ambulatory Visit (HOSPITAL_COMMUNITY): Payer: BC Managed Care – PPO | Admitting: Anesthesiology

## 2018-11-12 DIAGNOSIS — M8458XA Pathological fracture in neoplastic disease, other specified site, initial encounter for fracture: Secondary | ICD-10-CM | POA: Diagnosis present

## 2018-11-12 DIAGNOSIS — Z419 Encounter for procedure for purposes other than remedying health state, unspecified: Secondary | ICD-10-CM

## 2018-11-12 DIAGNOSIS — M4856XA Collapsed vertebra, not elsewhere classified, lumbar region, initial encounter for fracture: Secondary | ICD-10-CM | POA: Diagnosis present

## 2018-11-12 DIAGNOSIS — Z9889 Other specified postprocedural states: Secondary | ICD-10-CM

## 2018-11-12 DIAGNOSIS — Z7984 Long term (current) use of oral hypoglycemic drugs: Secondary | ICD-10-CM | POA: Insufficient documentation

## 2018-11-12 DIAGNOSIS — Z79891 Long term (current) use of opiate analgesic: Secondary | ICD-10-CM | POA: Insufficient documentation

## 2018-11-12 DIAGNOSIS — T451X5A Adverse effect of antineoplastic and immunosuppressive drugs, initial encounter: Secondary | ICD-10-CM | POA: Insufficient documentation

## 2018-11-12 DIAGNOSIS — C7951 Secondary malignant neoplasm of bone: Secondary | ICD-10-CM | POA: Diagnosis not present

## 2018-11-12 DIAGNOSIS — Z853 Personal history of malignant neoplasm of breast: Secondary | ICD-10-CM | POA: Diagnosis not present

## 2018-11-12 DIAGNOSIS — Z79899 Other long term (current) drug therapy: Secondary | ICD-10-CM | POA: Diagnosis not present

## 2018-11-12 DIAGNOSIS — Z79810 Long term (current) use of selective estrogen receptor modulators (SERMs): Secondary | ICD-10-CM | POA: Diagnosis not present

## 2018-11-12 DIAGNOSIS — Z6841 Body Mass Index (BMI) 40.0 and over, adult: Secondary | ICD-10-CM | POA: Diagnosis not present

## 2018-11-12 DIAGNOSIS — G620 Drug-induced polyneuropathy: Secondary | ICD-10-CM | POA: Insufficient documentation

## 2018-11-12 HISTORY — PX: KYPHOPLASTY: SHX5884

## 2018-11-12 LAB — GLUCOSE, CAPILLARY
Glucose-Capillary: 114 mg/dL — ABNORMAL HIGH (ref 70–99)
Glucose-Capillary: 205 mg/dL — ABNORMAL HIGH (ref 70–99)
Glucose-Capillary: 174 mg/dL — ABNORMAL HIGH (ref 70–99)

## 2018-11-12 SURGERY — KYPHOPLASTY
Anesthesia: General

## 2018-11-12 MED ORDER — 0.9 % SODIUM CHLORIDE (POUR BTL) OPTIME
TOPICAL | Status: DC | PRN
  Administered 2018-11-12: 11:00:00 1000 mL

## 2018-11-12 MED ORDER — ACETAMINOPHEN 500 MG PO TABS
1000.0000 mg | ORAL_TABLET | Freq: Once | ORAL | Status: AC
  Administered 2018-11-12: 11:00:00 1000 mg via ORAL

## 2018-11-12 MED ORDER — OXYCODONE HCL 5 MG PO TABS
ORAL_TABLET | ORAL | Status: AC
  Filled 2018-11-12: qty 1

## 2018-11-12 MED ORDER — ONDANSETRON HCL 4 MG/2ML IJ SOLN
INTRAMUSCULAR | Status: DC | PRN
  Administered 2018-11-12: 4 mg via INTRAVENOUS

## 2018-11-12 MED ORDER — SUCCINYLCHOLINE CHLORIDE 20 MG/ML IJ SOLN
INTRAMUSCULAR | Status: DC | PRN
  Administered 2018-11-12: 140 mg via INTRAVENOUS

## 2018-11-12 MED ORDER — PROPOFOL 10 MG/ML IV BOLUS
INTRAVENOUS | Status: DC | PRN
  Administered 2018-11-12: 150 mg via INTRAVENOUS
  Administered 2018-11-12: 50 mg via INTRAVENOUS

## 2018-11-12 MED ORDER — BUPIVACAINE-EPINEPHRINE 0.25% -1:200000 IJ SOLN
INTRAMUSCULAR | Status: DC | PRN
  Administered 2018-11-12: 25 mL
  Administered 2018-11-12: 10 mL

## 2018-11-12 MED ORDER — PHENYLEPHRINE HCL (PRESSORS) 10 MG/ML IV SOLN
INTRAVENOUS | Status: DC | PRN
  Administered 2018-11-12 (×3): 80 ug via INTRAVENOUS
  Administered 2018-11-12: 40 ug via INTRAVENOUS
  Administered 2018-11-12: 120 ug via INTRAVENOUS

## 2018-11-12 MED ORDER — METHOCARBAMOL 500 MG PO TABS
500.0000 mg | ORAL_TABLET | Freq: Three times a day (TID) | ORAL | Status: DC | PRN
  Administered 2018-11-12 – 2018-11-13 (×3): 500 mg via ORAL
  Filled 2018-11-12 (×3): qty 1

## 2018-11-12 MED ORDER — PHENYLEPHRINE HCL-NACL 10-0.9 MG/250ML-% IV SOLN
INTRAVENOUS | Status: AC
  Filled 2018-11-12: qty 500

## 2018-11-12 MED ORDER — BUPIVACAINE LIPOSOME 1.3 % IJ SUSP
20.0000 mL | Freq: Once | INTRAMUSCULAR | Status: DC
  Filled 2018-11-12: qty 20

## 2018-11-12 MED ORDER — IOPAMIDOL (ISOVUE-300) INJECTION 61%
INTRAVENOUS | Status: DC | PRN
  Administered 2018-11-12: 50 mL

## 2018-11-12 MED ORDER — BUPIVACAINE LIPOSOME 1.3 % IJ SUSP
INTRAMUSCULAR | Status: DC | PRN
  Administered 2018-11-12: 20 mL

## 2018-11-12 MED ORDER — FENTANYL CITRATE (PF) 100 MCG/2ML IJ SOLN
INTRAMUSCULAR | Status: DC | PRN
  Administered 2018-11-12: 100 ug via INTRAVENOUS

## 2018-11-12 MED ORDER — STERILE WATER FOR IRRIGATION IR SOLN
Status: DC | PRN
  Administered 2018-11-12: 1000 mL

## 2018-11-12 MED ORDER — FENTANYL CITRATE (PF) 100 MCG/2ML IJ SOLN
INTRAMUSCULAR | Status: AC
  Filled 2018-11-12: qty 2

## 2018-11-12 MED ORDER — LIDOCAINE 2% (20 MG/ML) 5 ML SYRINGE
INTRAMUSCULAR | Status: AC
  Filled 2018-11-12: qty 15

## 2018-11-12 MED ORDER — PROPOFOL 10 MG/ML IV BOLUS
INTRAVENOUS | Status: AC
  Filled 2018-11-12: qty 40

## 2018-11-12 MED ORDER — MIDAZOLAM HCL 5 MG/5ML IJ SOLN
INTRAMUSCULAR | Status: DC | PRN
  Administered 2018-11-12: 2 mg via INTRAVENOUS

## 2018-11-12 MED ORDER — POLYETHYLENE GLYCOL 3350 17 G PO PACK: 17.0000 g | PACK | Freq: Every day | ORAL | Status: DC | PRN

## 2018-11-12 MED ORDER — METFORMIN HCL 500 MG PO TABS
500.0000 mg | ORAL_TABLET | Freq: Two times a day (BID) | ORAL | Status: DC
  Administered 2018-11-12 – 2018-11-13 (×2): 500 mg via ORAL
  Filled 2018-11-12 (×2): qty 1

## 2018-11-12 MED ORDER — ONDANSETRON HCL 4 MG/2ML IJ SOLN: 4.0000 mg | Freq: Once | INTRAMUSCULAR | Status: DC | PRN

## 2018-11-12 MED ORDER — EPHEDRINE SULFATE 50 MG/ML IJ SOLN
INTRAMUSCULAR | Status: DC | PRN
  Administered 2018-11-12: 10 mg via INTRAVENOUS

## 2018-11-12 MED ORDER — OXYCODONE HCL 5 MG PO TABS
5.0000 mg | ORAL_TABLET | Freq: Once | ORAL | Status: AC
  Administered 2018-11-12: 13:00:00 5 mg via ORAL

## 2018-11-12 MED ORDER — MORPHINE SULFATE ER 30 MG PO TBCR
30.0000 mg | EXTENDED_RELEASE_TABLET | Freq: Two times a day (BID) | ORAL | Status: DC
  Administered 2018-11-12 – 2018-11-13 (×3): 30 mg via ORAL
  Filled 2018-11-12 (×3): qty 1

## 2018-11-12 MED ORDER — LACTATED RINGERS IV SOLN: INTRAVENOUS | Status: DC

## 2018-11-12 MED ORDER — LIDOCAINE 2% (20 MG/ML) 5 ML SYRINGE
INTRAMUSCULAR | Status: DC | PRN
  Administered 2018-11-12: 100 mg via INTRAVENOUS

## 2018-11-12 MED ORDER — ACETAMINOPHEN 650 MG RE SUPP: 650.0000 mg | RECTAL | Status: DC | PRN

## 2018-11-12 MED ORDER — ACETAMINOPHEN 500 MG PO TABS
ORAL_TABLET | ORAL | Status: AC
  Filled 2018-11-12: qty 1

## 2018-11-12 MED ORDER — SUGAMMADEX SODIUM 200 MG/2ML IV SOLN
INTRAVENOUS | Status: DC | PRN
  Administered 2018-11-12: 200 mg via INTRAVENOUS

## 2018-11-12 MED ORDER — ROCURONIUM BROMIDE 10 MG/ML (PF) SYRINGE
PREFILLED_SYRINGE | INTRAVENOUS | Status: AC
  Filled 2018-11-12: qty 10

## 2018-11-12 MED ORDER — ONDANSETRON HCL 4 MG/2ML IJ SOLN
INTRAMUSCULAR | Status: AC
  Filled 2018-11-12: qty 4

## 2018-11-12 MED ORDER — ROCURONIUM BROMIDE 50 MG/5ML IV SOSY
PREFILLED_SYRINGE | INTRAVENOUS | Status: DC | PRN
  Administered 2018-11-12: 30 mg via INTRAVENOUS

## 2018-11-12 MED ORDER — FENTANYL CITRATE (PF) 250 MCG/5ML IJ SOLN
INTRAMUSCULAR | Status: AC
  Filled 2018-11-12: qty 5

## 2018-11-12 MED ORDER — GABAPENTIN 300 MG PO CAPS
300.0000 mg | ORAL_CAPSULE | Freq: Three times a day (TID) | ORAL | Status: DC
  Administered 2018-11-12 – 2018-11-13 (×3): 300 mg via ORAL
  Filled 2018-11-12 (×3): qty 1

## 2018-11-12 MED ORDER — OXYCODONE HCL 5 MG PO TABS
5.0000 mg | ORAL_TABLET | Freq: Four times a day (QID) | ORAL | Status: DC | PRN
  Administered 2018-11-12 – 2018-11-13 (×3): 10 mg via ORAL
  Filled 2018-11-12 (×3): qty 2

## 2018-11-12 MED ORDER — ACETAMINOPHEN 500 MG PO TABS
1000.0000 mg | ORAL_TABLET | Freq: Four times a day (QID) | ORAL | Status: DC
  Administered 2018-11-12 – 2018-11-13 (×3): 1000 mg via ORAL
  Filled 2018-11-12 (×3): qty 2

## 2018-11-12 MED ORDER — TRANEXAMIC ACID-NACL 1000-0.7 MG/100ML-% IV SOLN
INTRAVENOUS | Status: AC
  Filled 2018-11-12: qty 100

## 2018-11-12 MED ORDER — PHENYLEPHRINE 40 MCG/ML (10ML) SYRINGE FOR IV PUSH (FOR BLOOD PRESSURE SUPPORT)
PREFILLED_SYRINGE | INTRAVENOUS | Status: AC
  Filled 2018-11-12: qty 10

## 2018-11-12 MED ORDER — METHOCARBAMOL 1000 MG/10ML IJ SOLN
500.0000 mg | Freq: Three times a day (TID) | INTRAVENOUS | Status: DC | PRN
  Filled 2018-11-12: qty 5

## 2018-11-12 MED ORDER — ACETAMINOPHEN 325 MG PO TABS: 650.0000 mg | ORAL_TABLET | ORAL | Status: DC | PRN

## 2018-11-12 MED ORDER — SUCCINYLCHOLINE CHLORIDE 200 MG/10ML IV SOSY
PREFILLED_SYRINGE | INTRAVENOUS | Status: AC
  Filled 2018-11-12: qty 10

## 2018-11-12 MED ORDER — EPHEDRINE 5 MG/ML INJ
INTRAVENOUS | Status: AC
  Filled 2018-11-12: qty 10

## 2018-11-12 MED ORDER — TRANEXAMIC ACID-NACL 1000-0.7 MG/100ML-% IV SOLN
INTRAVENOUS | Status: DC | PRN
  Administered 2018-11-12: 1000 mg via INTRAVENOUS

## 2018-11-12 MED ORDER — LIDOCAINE 2% (20 MG/ML) 5 ML SYRINGE
INTRAMUSCULAR | Status: AC
  Filled 2018-11-12: qty 5

## 2018-11-12 MED ORDER — SODIUM CHLORIDE 0.9 % IV SOLN
INTRAVENOUS | Status: DC | PRN
  Administered 2018-11-12: 50 ug/min via INTRAVENOUS

## 2018-11-12 MED ORDER — BUPIVACAINE-EPINEPHRINE (PF) 0.25% -1:200000 IJ SOLN
INTRAMUSCULAR | Status: AC
  Filled 2018-11-12: qty 30

## 2018-11-12 MED ORDER — FENTANYL CITRATE (PF) 100 MCG/2ML IJ SOLN
25.0000 ug | INTRAMUSCULAR | Status: DC | PRN
  Administered 2018-11-12 (×2): 50 ug via INTRAVENOUS

## 2018-11-12 MED ORDER — DEXAMETHASONE SODIUM PHOSPHATE 10 MG/ML IJ SOLN
INTRAMUSCULAR | Status: DC | PRN
  Administered 2018-11-12: 5 mg via INTRAVENOUS

## 2018-11-12 MED ORDER — MIDAZOLAM HCL 2 MG/2ML IJ SOLN
INTRAMUSCULAR | Status: AC
  Filled 2018-11-12: qty 2

## 2018-11-12 MED ORDER — ONDANSETRON HCL 4 MG PO TABS: 8.0000 mg | ORAL_TABLET | Freq: Three times a day (TID) | ORAL | Status: DC | PRN

## 2018-11-12 MED ORDER — CEFAZOLIN SODIUM-DEXTROSE 2-4 GM/100ML-% IV SOLN
2.0000 g | Freq: Three times a day (TID) | INTRAVENOUS | Status: AC
  Administered 2018-11-12: 2 g via INTRAVENOUS
  Filled 2018-11-12 (×2): qty 100

## 2018-11-12 MED ORDER — SODIUM CHLORIDE 0.9% FLUSH
3.0000 mL | Freq: Two times a day (BID) | INTRAVENOUS | Status: DC
  Administered 2018-11-12: 3 mL via INTRAVENOUS

## 2018-11-12 MED ORDER — DEXAMETHASONE SODIUM PHOSPHATE 10 MG/ML IJ SOLN
INTRAMUSCULAR | Status: AC
  Filled 2018-11-12: qty 2

## 2018-11-12 MED ORDER — SODIUM CHLORIDE 0.9% FLUSH: 3.0000 mL | INTRAVENOUS | Status: DC | PRN

## 2018-11-12 MED ORDER — LACTATED RINGERS IV SOLN
INTRAVENOUS | Status: DC
  Administered 2018-11-12: 11:00:00 via INTRAVENOUS

## 2018-11-12 SURGICAL SUPPLY — 52 items
ADH SKN CLS APL DERMABOND .7 (GAUZE/BANDAGES/DRESSINGS) ×1
BLADE SURG 15 STRL LF DISP TIS (BLADE) ×1 IMPLANT
BLADE SURG 15 STRL SS (BLADE) ×3
BNDG ADH 1X3 SHEER STRL LF (GAUZE/BANDAGES/DRESSINGS) ×4 IMPLANT
BNDG ADH THN 3X1 STRL LF (GAUZE/BANDAGES/DRESSINGS) ×1
CEMENT BONE KYPHX HV R (Orthopedic Implant) ×2 IMPLANT
CEMENT KYPHON C01A KIT/MIXER (Cement) ×2 IMPLANT
CEMENT KYPHON CX01A KIT/MIXER (Cement) ×3 IMPLANT
CONT SPEC 4OZ CLIKSEAL STRL BL (MISCELLANEOUS) ×4 IMPLANT
COVER SURGICAL LIGHT HANDLE (MISCELLANEOUS) ×3 IMPLANT
COVER WAND RF STERILE (DRAPES) ×1 IMPLANT
CURETTE EXPRESS SZ2 7MM (INSTRUMENTS) IMPLANT
CURETTE WEDGE 8.5MM KYPHX (MISCELLANEOUS) IMPLANT
CURRETTE EXPRESS SZ2 7MM (INSTRUMENTS) ×3
DERMABOND ADVANCED (GAUZE/BANDAGES/DRESSINGS) ×2
DERMABOND ADVANCED .7 DNX12 (GAUZE/BANDAGES/DRESSINGS) ×1 IMPLANT
DEVICE BIOPSY BONE KYPHX (INSTRUMENTS) ×2 IMPLANT
DRAPE C-ARM 42X72 X-RAY (DRAPES) ×6 IMPLANT
DRAPE INCISE IOBAN 66X45 STRL (DRAPES) ×3 IMPLANT
DRAPE LAPAROTOMY T 102X78X121 (DRAPES) ×3 IMPLANT
DRAPE SURG 17X23 STRL (DRAPES) ×3 IMPLANT
DRAPE U-SHAPE 47X51 STRL (DRAPES) ×3 IMPLANT
DRAPE WARM FLUID 44X44 (DRAPES) ×3 IMPLANT
DRSG TELFA 3X8 NADH (GAUZE/BANDAGES/DRESSINGS) ×3 IMPLANT
DURAPREP 26ML APPLICATOR (WOUND CARE) ×3 IMPLANT
GLOVE BIO SURGEON STRL SZ 6.5 (GLOVE) ×2 IMPLANT
GLOVE BIO SURGEONS STRL SZ 6.5 (GLOVE) ×1
GLOVE BIOGEL PI IND STRL 6.5 (GLOVE) ×1 IMPLANT
GLOVE BIOGEL PI IND STRL 8.5 (GLOVE) ×1 IMPLANT
GLOVE BIOGEL PI INDICATOR 6.5 (GLOVE) ×2
GLOVE BIOGEL PI INDICATOR 8.5 (GLOVE) ×2
GLOVE SS BIOGEL STRL SZ 8.5 (GLOVE) ×1 IMPLANT
GLOVE SUPERSENSE BIOGEL SZ 8.5 (GLOVE) ×2
GOWN STRL REUS W/ TWL LRG LVL3 (GOWN DISPOSABLE) ×2 IMPLANT
GOWN STRL REUS W/TWL 2XL LVL3 (GOWN DISPOSABLE) ×3 IMPLANT
GOWN STRL REUS W/TWL LRG LVL3 (GOWN DISPOSABLE) ×6
KIT BASIN OR (CUSTOM PROCEDURE TRAY) ×3 IMPLANT
KIT TURNOVER KIT B (KITS) ×3 IMPLANT
NDL SPNL 18GX3.5 QUINCKE PK (NEEDLE) ×2 IMPLANT
NEEDLE SPNL 18GX3.5 QUINCKE PK (NEEDLE) ×6 IMPLANT
NS IRRIG 1000ML POUR BTL (IV SOLUTION) ×3 IMPLANT
PACK SURGICAL SETUP 50X90 (CUSTOM PROCEDURE TRAY) ×3 IMPLANT
PACK UNIVERSAL I (CUSTOM PROCEDURE TRAY) ×3 IMPLANT
PAD ARMBOARD 7.5X6 YLW CONV (MISCELLANEOUS) ×6 IMPLANT
PAD DRESSING TELFA 3X8 NADH (GAUZE/BANDAGES/DRESSINGS) IMPLANT
SPONGE LAP 4X18 RFD (DISPOSABLE) ×3 IMPLANT
SUT MNCRL AB 3-0 PS2 18 (SUTURE) ×3 IMPLANT
SYR CONTROL 10ML LL (SYRINGE) ×3 IMPLANT
TOWEL GREEN STERILE (TOWEL DISPOSABLE) ×3 IMPLANT
TRAY KYPHOPAK 15/3 ONESTEP 1ST (MISCELLANEOUS) ×2 IMPLANT
TRAY KYPHOPAK 20/3 ONESTEP 1ST (MISCELLANEOUS) IMPLANT
WATER STERILE IRR 1000ML POUR (IV SOLUTION) ×3 IMPLANT

## 2018-11-12 NOTE — Op Note (Signed)
Operative note  Preoperative diagnosis: Metastatic L2 compression fracture.  History of metastatic breast cancer  Postoperative diagnosis: Same  Operative procedure: L2 biopsy.  Kyphoplasty L2  First assistant: Cleta Alberts, PA  Complications: None  Intraoperative findings: Loss of right pedicle on AP fluoroscopy view consistent with metastatic disease.  Made multiple passes on the right side into the vertebral body for biopsy.  Specimen sent to pathology for permanent valuation.  Operative report: Caitlin Cohen is a very pleasant 53 year old man with known history of metastatic breast cancer.  She presented to my office with significant back pain and no focal neurological deficits.  MRI and x-rays demonstrated slight anterior compression deformity of L2 but significant marrow signal changes consistent with metastatic disease.  Patient's primary area of disease was on the right side of the vertebral body extending into the right L2 pedicle.  After discussing treatment options he elected to move forward with a biopsy as well as kyphoplasty to help decrease her pain.  Operative note  Patient is brought the operating room on a stretcher.  After successful induction of general anesthesia and endotracheal ovation she was turned prone onto the Wilson frame.  All bony prominences were well-padded and the back was prepped and draped in a standard fashion.  Timeout was taken to confirm patient procedure and all other important data.  Intraoperative fluoroscopy x-rays were brought into the field 1 in the AP the other in the lateral plane.  I counted from the L5 vertebral body up to the L2 and confirmed as at the appropriate level.  On the AP view I cannot clearly visualize the right L2 pedicle as it was degraded secondary to the tumor.   On the left-hand side a small stab incision was made on the lateral side of the L2 pedicle.  I advanced the Jamshidi needle down to the lateral aspect of the facet complex and then  advanced the Jamshidi needle into the pedicle.  I used a transpedicular approach.  Once I was nearing the medial border of the pedicle I confirmed that on the lateral I was just beyond the posterior wall of the vertebral body.  Having confirmed trajectory and position I advanced into the vertebral body.  I then used my coring biopsy needle to take a sample of bone.  This bone was dense hard and appeared to be normal.  I then advanced the drill and then palpated the hole to confirm a solid canal.  At this point I turned my attention to the right side.  I did small stab incision and advanced the Jamshidi needle down to the lateral aspect of the spine.  Using the AP and lateral view and the L1 and L3 pedicle as assistant marking devices I was able to take a extrapedicular approach into the L2 vertebral body.  I advanced the biopsy needle and noted no resistance.  I confirmed that I remained within the anatomical borders of the vertebral body.  I made multiple passes into the tumor mass.  At this point I sent the specimens to pathology for permanent evaluation.  I then placed the inflatable bone tamps and gently inflated them.  With the cavity created I inserted about 1-1/2 cc of cement on either side.  Using live fluoroscopy during the cement implant placement I confirmed that there was no leak posteriorly anteriorly or laterally.  There is a small amount of cement superiorly noted on the lateral view.  At this point I elected not to place more cement especially  on the right-hand side.  My concern was leakage of cement given the poor consistency of the vertebral body secondary to the tumor.  At this point with the cement allowed to harden I remove the trocar and clean the skin.  I then injected a total of 20 cc of quarter percent Marcaine mixed with Exparel for postoperative analgesia.  The stab incisions were closed with interrupted 3-0 Monocryl sutures and dry dressings were applied.  The patient was ultimately  extubated and transferred the PACU without incident.  The end of the case all needle sponge counts were correct.  There were no adverse intraoperative events.

## 2018-11-12 NOTE — H&P (Signed)
HPI Notes: Caitlin Cohen is a pleasant 53 year old female with past medical history significant for metastatic breast cancer who is scheduled for L2 Biopsy, L2 Kyphoplasty on 11/12/18 With Dr. Rolena Infante. She describes her pain at approximately the L2 level and radiating along primarily her right side and hip.  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  Clinical exam: Caitlin Cohen is a pleasant individual, who appears younger than their stated age. She Is alert and orientated 3. No shortness of breath, chest pain.  Heart: Regular rate and rhythm, no rubs, murmurs, or gallops.  Lungs: Clear to auscultation bilaterally  Abdomen is soft and non-tender, negative loss of bowel and bladder control, no rebound tenderness.  Negative: skin lesions abrasions contusions  Peripheral pulses: 2+ dorsalis pedis/posterior tibialis pulses bilaterally. Compartment soft and nontender.  Gait pattern: Altered gait pattern with significant pain when she arises from a seated position.  Ataxic gait pattern due to severe low back pain.  Assistive devices: None  Neuro: Patient status post chemo and has bilateral hand and foot dysesthesias (neuropathy). 5/5 motor strength in the lower extremity bilaterally. Dysesthesias into the foot and ankle region bilaterally. Negative Babinski test, no clonus, negative straight leg raise test.  Musculoskeletal: Horrific upper lumbar pain with direct palpation and any attempts at forward flexion or rotation. Positive improvement in pain with extension. No hip, knee, ankle pain with isolated joint range of motion. No pain radiating into the lower extremity from the back.  MRI of the lumbar spine as well as plain x-rays were reviewed. Patient has an obvious signal abnormality at the L2 vertebral body level. There is edema and marrow signal changes on the right side of vertebral body extending into the pedicle and superior facet at L2. No significant foraminal or central stenosis is noted. Of 35% loss of anterior height consistent with a pathological fracture at L2. Mild degenerative changes at the remainder of the levels. There is a questionable abnormality seen at inferior aspect of T11 (partially visualized)  Lumbar x-rays demonstrate degenerative lumbar disc disease primarily at the L2-3 level. Cannot appreciate any thoracic fracture. Qualities of her x-rays are limited due to body habitus.   MRI of the thoracic spine demonstrates no evidence of any lesion or abnormality within the vertebral body.  No significant stenosis or impending pathological fracture.    Assessment / Plan Assessment: Caitlin Cohen is a very pleasant 53 year old man with known breast cancer with metastasis. The patient has had severe of mid lumbar pain that his significantly adversely affected her quality-of-life. Fortunately she has no focal neurological deficits, but her pain significantly hindered her ability to function as well as ambulate. Imaging studies demonstrate a pathological  lesion at L2 consistent with a compression fracture.  I have gone over the imaging studies with her in great detail. Since there is no focal neurological deficits or significant stenosis I believe the best option to help alleviate her pain is a kyphoplasty.  Plan: L2 biopsy and L2 kyphoplasty on 11/12/2018  Risks of surgery include: Infection, bleeding, death, stroke, paralysis, nerve damage, leak of cement, need for additional surgery including open decompression. Ongoing or worse pain.  Goals of surgery: Reduction in pain, and improvement in quality of life  We have also discussed the post-operative recovery period to include: bathing/showering restrictions, wound healing, activity (and driving) restrictions, medications/pain management.  We have also discussed post-operative redflags to include: signs and symptoms of postoperative infection, DVT/PE.  Patient was provided a work note which will allow her to be out of work on the surgery day until 2 weeks postoperatively when she will be here for a follow-up.  We have obtained preoperative clearance from her oncologist.  Patient has her preoperative evaluation scheduled at [facility] this upcoming Monday.  Follow-up: Patient will follow up 2 weeks status post surgery.

## 2018-11-12 NOTE — Telephone Encounter (Signed)
Called patient regarding upcoming Webex appointment, patient is notified and e-mail has been sent. °

## 2018-11-12 NOTE — Anesthesia Preprocedure Evaluation (Signed)
Anesthesia Evaluation  Patient identified by MRN, date of birth, ID band Patient awake    Reviewed: Allergy & Precautions, NPO status , Patient's Chart, lab work & pertinent test results  Airway Mallampati: II  TM Distance: >3 FB Neck ROM: Full    Dental  (+) Teeth Intact, Dental Advisory Given   Pulmonary neg pulmonary ROS,    Pulmonary exam normal breath sounds clear to auscultation       Cardiovascular negative cardio ROS Normal cardiovascular exam Rhythm:Regular Rate:Normal     Neuro/Psych  Headaches, PSYCHIATRIC DISORDERS Anxiety Depression    GI/Hepatic Neg liver ROS, GERD  ,  Endo/Other  diabetes, Type 2, Oral Hypoglycemic AgentsMorbid obesity  Renal/GU negative Renal ROS     Musculoskeletal negative musculoskeletal ROS (+)   Abdominal   Peds  Hematology negative hematology ROS (+)   Anesthesia Other Findings Day of surgery medications reviewed with the patient.  Right breast cancer  Reproductive/Obstetrics                             Anesthesia Physical Anesthesia Plan  ASA: III  Anesthesia Plan: General   Post-op Pain Management:    Induction: Intravenous  PONV Risk Score and Plan: 3 and Midazolam, Dexamethasone and Ondansetron  Airway Management Planned: Oral ETT  Additional Equipment:   Intra-op Plan:   Post-operative Plan: Extubation in OR  Informed Consent: I have reviewed the patients History and Physical, chart, labs and discussed the procedure including the risks, benefits and alternatives for the proposed anesthesia with the patient or authorized representative who has indicated his/her understanding and acceptance.     Dental advisory given  Plan Discussed with: CRNA  Anesthesia Plan Comments:         Anesthesia Quick Evaluation

## 2018-11-12 NOTE — Brief Op Note (Signed)
11/12/2018  12:34 PM  PATIENT:  Caitlin Cohen  53 y.o. female  PRE-OPERATIVE DIAGNOSIS:  LUMBAR TWO pathologic fracture  POST-OPERATIVE DIAGNOSIS:  LUMBAR TWO pathologic fracture  PROCEDURE:  Procedure(s) with comments: LUMBAR TWO  BIOPSY AND LUMBAR TWO KYPHOPLASTY (N/A) - 90 mins  SURGEON:  Surgeon(s) and Role:    Melina Schools, MD - Primary  PHYSICIAN ASSISTANT:   ASSISTANTS: Amanda Ward, PA   ANESTHESIA:   general  EBL:  10 mL   BLOOD ADMINISTERED:none  DRAINS: none   LOCAL MEDICATIONS USED:  MARCAINE    and OTHER exparel  SPECIMEN:  Source of Specimen:  L2 vertebral body  DISPOSITION OF SPECIMEN:  PATHOLOGY  COUNTS:  YES  TOURNIQUET:  * No tourniquets in log *  DICTATION: .Dragon Dictation  PLAN OF CARE: Discharge to home after PACU  PATIENT DISPOSITION:  PACU - hemodynamically stable.

## 2018-11-12 NOTE — Transfer of Care (Signed)
Immediate Anesthesia Transfer of Care Note  Patient: Caitlin Cohen  Procedure(s) Performed: LUMBAR TWO  BIOPSY AND LUMBAR TWO KYPHOPLASTY (N/A )  Patient Location: PACU  Anesthesia Type:General  Level of Consciousness: awake, alert , oriented and sedated  Airway & Oxygen Therapy: Patient Spontanous Breathing and Patient connected to nasal cannula oxygen  Post-op Assessment: Report given to RN, Post -op Vital signs reviewed and stable and Patient moving all extremities  Post vital signs: Reviewed and stable  Last Vitals:  Vitals Value Taken Time  BP 135/83 11/12/18 1244  Temp    Pulse 86 11/12/18 1245  Resp 19 11/12/18 1245  SpO2 100 % 11/12/18 1245  Vitals shown include unvalidated device data.  Last Pain:  Vitals:   11/12/18 1050  TempSrc: Oral  PainSc:       Patients Stated Pain Goal: 4 (64/15/83 0940)  Complications: No apparent anesthesia complications

## 2018-11-12 NOTE — Telephone Encounter (Signed)
Contacted patient to verify webex visit for pre reg °

## 2018-11-12 NOTE — Anesthesia Procedure Notes (Signed)
Procedure Name: Intubation Date/Time: 11/12/2018 11:24 AM Performed by: Scheryl Darter, CRNA Pre-anesthesia Checklist: Patient identified, Emergency Drugs available, Suction available and Patient being monitored Patient Re-evaluated:Patient Re-evaluated prior to induction Oxygen Delivery Method: Circle System Utilized Preoxygenation: Pre-oxygenation with 100% oxygen Induction Type: IV induction Ventilation: Mask ventilation without difficulty Laryngoscope Size: Miller and 2 Grade View: Grade I Tube type: Oral Tube size: 7.5 mm Number of attempts: 1 Airway Equipment and Method: Stylet and Oral airway Placement Confirmation: ETT inserted through vocal cords under direct vision,  positive ETCO2 and breath sounds checked- equal and bilateral Secured at: 23 cm Tube secured with: Tape Dental Injury: Teeth and Oropharynx as per pre-operative assessment

## 2018-11-13 ENCOUNTER — Encounter (HOSPITAL_COMMUNITY): Payer: Self-pay | Admitting: Orthopedic Surgery

## 2018-11-13 ENCOUNTER — Inpatient Hospital Stay: Payer: Medicaid Other | Admitting: Hematology

## 2018-11-13 ENCOUNTER — Other Ambulatory Visit: Payer: Self-pay | Admitting: Hematology

## 2018-11-13 DIAGNOSIS — M8458XA Pathological fracture in neoplastic disease, other specified site, initial encounter for fracture: Secondary | ICD-10-CM | POA: Diagnosis not present

## 2018-11-13 LAB — GLUCOSE, CAPILLARY: Glucose-Capillary: 133 mg/dL — ABNORMAL HIGH (ref 70–99)

## 2018-11-13 MED ORDER — OXYCODONE HCL 5 MG PO TABS: 5.0000 mg | ORAL_TABLET | Freq: Four times a day (QID) | ORAL | 0 refills | Status: DC | PRN

## 2018-11-13 NOTE — Progress Notes (Signed)
Patient is discharged from room 3C07 at this time. Alert and in stable condition. IV site d/c'd and instructions read to patient with understanding verbalized. Left unit via wheelchair with all belongings at side. 

## 2018-11-13 NOTE — Anesthesia Postprocedure Evaluation (Signed)
Anesthesia Post Note  Patient: Caitlin Cohen  Procedure(s) Performed: LUMBAR TWO  BIOPSY AND LUMBAR TWO KYPHOPLASTY (N/A )     Patient location during evaluation: PACU Anesthesia Type: General Level of consciousness: awake and alert Pain management: pain level controlled Vital Signs Assessment: post-procedure vital signs reviewed and stable Respiratory status: spontaneous breathing, nonlabored ventilation, respiratory function stable and patient connected to nasal cannula oxygen Cardiovascular status: blood pressure returned to baseline and stable Postop Assessment: no apparent nausea or vomiting Anesthetic complications: no    Last Vitals:  Vitals:   11/13/18 0357 11/13/18 0756  BP: 114/74 118/84  Pulse: 77 81  Resp: 20 18  Temp: 36.8 C 36.7 C  SpO2: 99% 100%    Last Pain:  Vitals:   11/13/18 0920  TempSrc:   PainSc: 3                  Catalina Gravel

## 2018-11-13 NOTE — Evaluation (Signed)
Physical Therapy Evaluation Patient Details Name: Caitlin Cohen MRN: [id number] DOB: November 11, 1965 Today's Date: 11/13/2018   History of Present Illness  Pt is a 53 yo female s/p L2 kyphoplasty on 7/30 by Dr. Rolena Infante. Pt was having pain in R side and hip. Pt PMHx: H/o mets breast cancer, peripheral neuropathy.  Clinical Impression  Pt admitted with above diagnosis. Pt currently with functional limitations due to the deficits listed below (see PT Problem List). At the time of PT eval pt was able to perform transfers and ambulation with gross supervision for safety with a [facility] for support. Pt reports back pain is manageable, however is having increased R groin pain that presented s/p surgery. Anticipate pt will progress well as groin pain subsides. Pt will benefit from skilled PT to increase their independence and safety with mobility to allow discharge to the venue listed below.       Follow Up Recommendations No PT follow up;Supervision - Intermittent    Equipment Recommendations  Cane    Recommendations for Other Services       Precautions / Restrictions Precautions Precautions: Back Precaution Booklet Issued: Yes (comment) Precaution Comments: Precaution sheet reviewed and pt was cued for precautions during functional mobility.  Restrictions Weight Bearing Restrictions: No      Mobility  Bed Mobility               General bed mobility comments: Pt was received sitting up in recliner  Transfers Overall transfer level: Modified independent Equipment used: None;Straight cane Transfers: Sit to/from Stand           General transfer comment: Supervision progressing to mod I by end of session with [facility] for support/pain control for R groin.   Ambulation/Gait Ambulation/Gait assistance: Supervision Gait Distance (Feet): 250 Feet Assistive device: None;Straight cane Gait Pattern/deviations: Step-through pattern;Decreased stride length;Trunk flexed Gait velocity:  Decreased Gait velocity interpretation: 1.31 - 2.62 ft/sec, indicative of limited community ambulator General Gait Details: Antalgic due to R groin pain. Pt was able to improve it with SPC to take pressure of RLE.   Stairs Stairs: Yes Stairs assistance: Min guard Stair Management: One rail Right;Step to pattern;Sideways Number of Stairs: 4 General stair comments: VC's for sequencing and general safety.   Wheelchair Mobility    Modified Rankin (Stroke Patients Only)       Balance Overall balance assessment: Modified Independent                                           Pertinent Vitals/Pain Pain Assessment: Faces Faces Pain Scale: Hurts a little bit Pain Location: back and R leg with mobility Pain Descriptors / Indicators: Discomfort Pain Intervention(s): Monitored during session    Home Living Family/patient expects to be discharged to:: Private residence Living Arrangements: Other relatives Available Help at Discharge: Family Type of Home: Apartment Home Access: Stairs to enter Entrance Stairs-Rails: Left Entrance Stairs-Number of Steps: 4 Home Layout: One level Home Equipment: Environmental consultant - 2 wheels;Bedside commode(items are her sister's but available for use.)      Prior Function Level of Independence: Independent               Hand Dominance   Dominant Hand: Right    Extremity/Trunk Assessment   Upper Extremity Assessment Upper Extremity Assessment: Overall WFL for tasks assessed    Lower Extremity Assessment Lower Extremity Assessment: RLE deficits/detail RLE Deficits /  Details: Decreased strength and muscular endurance 2 groin pain that presented after surgery.     Cervical / Trunk Assessment Cervical / Trunk Assessment: Normal  Communication   Communication: No difficulties  Cognition Arousal/Alertness: Awake/alert Behavior During Therapy: WFL for tasks assessed/performed Overall Cognitive Status: Within Functional Limits  for tasks assessed                                        General Comments      Exercises     Assessment/Plan    PT Assessment Patient needs continued PT services  PT Problem List Decreased strength;Decreased activity tolerance;Decreased balance;Decreased mobility;Decreased knowledge of use of DME;Decreased safety awareness;Decreased knowledge of precautions;Pain       PT Treatment Interventions DME instruction;Gait training;Functional mobility training;Stair training;Therapeutic activities;Therapeutic exercise;Neuromuscular re-education;Patient/family education    PT Goals (Current goals can be found in the Care Plan section)  Acute Rehab PT Goals Patient Stated Goal: to go home PT Goal Formulation: With patient Time For Goal Achievement: 11/20/18 Potential to Achieve Goals: Good    Frequency Min 5X/week   Barriers to discharge        Co-evaluation               AM-PAC PT "6 Clicks" Mobility  Outcome Measure Help needed turning from your back to your side while in a flat bed without using bedrails?: None Help needed moving from lying on your back to sitting on the side of a flat bed without using bedrails?: None Help needed moving to and from a bed to a chair (including a wheelchair)?: A Little Help needed standing up from a chair using your arms (e.g., wheelchair or bedside chair)?: A Little Help needed to walk in hospital room?: A Little Help needed climbing 3-5 steps with a railing? : A Little 6 Click Score: 20    End of Session Equipment Utilized During Treatment: Gait belt Activity Tolerance: Patient tolerated treatment well Patient left: in chair;with call bell/phone within reach Nurse Communication: Mobility status PT Visit Diagnosis: Unsteadiness on feet (R26.81);Pain Pain - part of body: (bacjk)    Time: 4562-5638 PT Time Calculation (min) (ACUTE ONLY): 20 min   Charges:   PT Evaluation $PT Eval Moderate Complexity: 1 Mod           Rolinda Roan, PT, DPT Acute Rehabilitation Services Pager: [phone] Office: [phone]   Thelma Comp 11/13/2018, 11:45 AM

## 2018-11-13 NOTE — Evaluation (Signed)
Occupational Therapy Evaluation Patient Details Name: Caitlin Cohen MRN: [id number] DOB: 1965/10/05 Today's Date: 11/13/2018    History of Present Illness Pt is a 53 yo female s/p L2 kyphoplasty on 7/30 by Dr. Rolena Infante. Pt was having pain in R side and hip. Pt PMHx: H/o mets breast cancer, peripheral neuropathy.   Clinical Impression   Pt PTA: living with sister and reporting independently. Pt currently performing ADL functional mobility and transfers with modified independence and ADL with modified independence with figure 4 technique. Pt education provided for LB ADL with AE, but pt has sister to assist as needed. Pt has access to RW and BSC from sister. Back handout provided. Pt educated on: clothing between brace, never sleep in brace, set an alarm at night for medication, avoid sitting for long periods of time, correct bed positioning for sleeping, correct sequence for bed mobility, avoiding lifting more than 5 pounds and never wash directly over incision. All education is complete and patient indicates understanding. Pt ready for discharge. OT signing off.      Follow Up Recommendations  No OT follow up    Equipment Recommendations  None recommended by OT    Recommendations for Other Services       Precautions / Restrictions Precautions Precautions: Back Precaution Booklet Issued: Yes (comment) Precaution Comments: verbally discussed Restrictions Weight Bearing Restrictions: No      Mobility Bed Mobility                  Transfers                      Balance Overall balance assessment: Modified Independent                                         ADL either performed or assessed with clinical judgement   ADL Overall ADL's : Modified independent                                       General ADL Comments: Pt performing figure 4 technique for LB ADL and plans to use [facility] as [place] in home. Pt has supportive sister to  assist as needed. Pt modifed independence with grooming at sink. Pt chose not to get dressed yet. AE education provided and pt interested in long handled sponge.      Vision Baseline Vision/History: No visual deficits Vision Assessment?: No apparent visual deficits     Perception     Praxis      Pertinent Vitals/Pain Pain Assessment: 0-10 Pain Score: 2  Pain Location: back and R leg with mobility Pain Descriptors / Indicators: Discomfort Pain Intervention(s): Limited activity within patient's tolerance;Premedicated before session     Hand Dominance Right   Extremity/Trunk Assessment Upper Extremity Assessment Upper Extremity Assessment: Overall WFL for tasks assessed   Lower Extremity Assessment Lower Extremity Assessment: Defer to PT evaluation;Overall [facility] for tasks assessed   Cervical / Trunk Assessment Cervical / Trunk Assessment: Normal   Communication Communication Communication: No difficulties   Cognition Arousal/Alertness: Awake/alert Behavior During Therapy: WFL for tasks assessed/performed Overall Cognitive Status: Within Functional Limits for tasks assessed  General Comments       Exercises     Shoulder Instructions      Home Living Family/patient expects to be discharged to:: Private residence Living Arrangements: Other relatives Available Help at Discharge: Family Type of Home: Apartment Home Access: Stairs to enter Technical brewer of Steps: 4 Entrance Stairs-Rails: Left Home Layout: One level     Bathroom Shower/Tub: Teacher, early years/pre: [place]: Environmental consultant - 2 wheels;Bedside commode(items are her sister's but available for use.)          Prior Functioning/Environment Level of Independence: Independent                 OT Problem List: Decreased activity tolerance      OT Treatment/Interventions:      OT Goals(Current goals can be found  in the care plan section) Acute Rehab OT Goals Patient Stated Goal: to go home OT Goal Formulation: With patient  OT Frequency:     Barriers to D/C:            Co-evaluation              AM-PAC OT "6 Clicks" Daily Activity     Outcome Measure Help from another person eating meals?: None Help from another person taking care of personal grooming?: None Help from another person toileting, which includes using toliet, bedpan, or urinal?: None Help from another person bathing (including washing, rinsing, drying)?: A Little Help from another person to put on and taking off regular upper body clothing?: None Help from another person to put on and taking off regular lower body clothing?: A Little 6 Click Score: 22   End of Session Nurse Communication: Mobility status  Activity Tolerance: Patient tolerated treatment well Patient left: in chair;with call bell/phone within reach  OT Visit Diagnosis: Pain;Unsteadiness on feet (R26.81) Pain - Right/Left: Right Pain - part of body: Leg                Time: 0732-0752 OT Time Calculation (min): 20 min Charges:  OT General Charges $OT Visit: 1 Visit OT Evaluation $OT Eval Moderate Complexity: 1 Mod  Darryl Nestle) Marsa Aris OTR/L Acute Rehabilitation Services Pager: [phone] Office: [place] 11/13/2018, 8:43 AM

## 2018-11-13 NOTE — Progress Notes (Signed)
    Subjective: Procedure(s) (LRB): LUMBAR TWO  BIOPSY AND LUMBAR TWO KYPHOPLASTY (N/A) 1 Day Post-Op  Patient reports pain as 0 on 0-10 scale.  Reports none leg pain reports incisional back pain  - overall back pain has improved Positive void Negative bowel movement Positive flatus Negative chest pain or shortness of breath  Objective: Vital signs in last 24 hours: Temp:  [97.2 F (36.2 C)-98.3 F (36.8 C)] 98 F (36.7 C) (07/31 0756) Pulse Rate:  [76-102] 81 (07/31 0756) Resp:  [9-20] 18 (07/31 0756) BP: (114-149)/(74-101) 118/84 (07/31 0756) SpO2:  [97 %-100 %] 100 % (07/31 0756) Weight:  [133.4 kg] 133.4 kg (07/30 1047)  Intake/Output from previous day: 07/30 0701 - 07/31 0700 In: 950 [I.V.:700; IV Piggyback:250] Out: 10 [Blood:10]  Labs: No results for input(s): WBC, RBC, HCT, PLT in the last 72 hours. No results for input(s): NA, K, CL, CO2, BUN, CREATININE, GLUCOSE, CALCIUM in the last 72 hours. No results for input(s): LABPT, INR in the last 72 hours.  Physical Exam: Neurologically intact ABD soft Intact pulses distally Incision: dressing C/D/I and no drainage Compartment soft Body mass index is 46.05 kg/m.   Assessment/Plan: Patient stable  xrays n/a Continue mobilization with physical therapy Continue care  Advance diet Up with therapy  Patient doing very well - pre-op pain improved Ambulating without difficulty Plan on d/c to home f/u in 2 weeks.  Melina Schools, MD [facility] [phone]

## 2018-11-16 LAB — GLUCOSE, CAPILLARY: Glucose-Capillary: 92 mg/dL (ref 70–99)

## 2018-11-16 NOTE — Discharge Summary (Signed)
Patient ID: Caitlin Cohen MRN: [id number] DOB/AGE: 1965-10-04 53 y.o.  Admit date: 11/12/2018 Discharge date: 11/16/2018  Admission Diagnoses:  Active Problems:   S/P kyphoplasty   Pathologic compression fracture of lumbar vertebra [facility])   Discharge Diagnoses:  Active Problems:   S/P kyphoplasty   Pathologic compression fracture of lumbar vertebra (HCC)  status post Procedure(s): LUMBAR TWO  BIOPSY AND LUMBAR TWO KYPHOPLASTY  Past Medical History:  Diagnosis Date   Anemia    Anxiety    Cancer ([place]) dx'd 06/2017   breast cancer right   Depression    Diabetes mellitus without complication (HCC)    GERD (gastroesophageal reflux disease)    Headache    History of radiation therapy 11/10/17- 12/08/17   right breast, 40.05 Gy in 15 fractions. Right breast boost 10 Gy in 5 fractions.    Personal history of chemotherapy    Personal history of radiation therapy     Surgeries: Procedure(s): LUMBAR TWO  BIOPSY AND LUMBAR TWO KYPHOPLASTY on 11/12/2018   Consultants: None  Discharged Condition: Improved  Hospital Course: Caitlin Cohen is an 53 y.o. female who was admitted 11/12/2018 for operative treatment of pathologic L2 compression fracture. Patient failed conservative treatments (please see the history and physical for the specifics) and had severe unremitting pain that affects sleep, daily activities and work/hobbies. After pre-op clearance, the patient was taken to the operating room on 11/12/2018 and underwent  Procedure(s): LUMBAR TWO  BIOPSY AND LUMBAR TWO KYPHOPLASTY.    Patient was given perioperative antibiotics:  Anti-infectives (From admission, onward)   Start     Dose/Rate Route Frequency Ordered Stop   11/12/18 1930  ceFAZolin (ANCEF) IVPB 2g/100 mL premix     2 g 200 mL/hr over 30 Minutes Intravenous Every 8 hours 11/12/18 1455 11/13/18 1129   11/12/18 1130  ceFAZolin (ANCEF) 3 g in dextrose 5 % 50 mL IVPB  Status:  Discontinued     3 g 100 mL/hr  over 30 Minutes Intravenous 30 min pre-op 11/11/18 0905 11/11/18 0906   11/12/18 1130  ceFAZolin (ANCEF) 3 g in dextrose 5 % 50 mL IVPB     3 g 100 mL/hr over 30 Minutes Intravenous 30 min pre-op 11/11/18 7209 11/12/18 1612       Patient was given sequential compression devices and early ambulation to prevent DVT.   Patient benefited maximally from hospital stay and there were no complications. At the time of discharge, the patient was urinating/moving their bowels without difficulty, tolerating a regular diet, pain is controlled with oral pain medications and they have been cleared by PT/OT.   Recent vital signs: No data found.   Recent laboratory studies: No results for input(s): WBC, HGB, HCT, PLT, NA, K, CL, CO2, BUN, CREATININE, GLUCOSE, INR, CALCIUM in the last 72 hours.  Invalid input(s): PT, 2   Discharge Medications:   Allergies as of 11/13/2018   No Known Allergies     Medication List    TAKE these medications   gabapentin 100 MG capsule Commonly known as: NEURONTIN Take 1 capsule in the morning and 3 capsules at bedtime What changed:   how much to take  how to take this  when to take this  additional instructions   metFORMIN 500 MG tablet Commonly known as: GLUCOPHAGE Take 1 tablet (500 mg total) by mouth 2 (two) times daily with a meal.   morphine 30 MG 12 hr tablet Commonly known as: MS CONTIN Take 1 tablet (30 mg  total) by mouth every 12 (twelve) hours.   ondansetron 8 MG tablet Commonly known as: ZOFRAN Take 1 tablet (8 mg total) by mouth every 8 (eight) hours as needed for nausea or vomiting.   oxyCODONE-acetaminophen 10-325 MG tablet Commonly known as: Percocet Take 1 tablet by mouth every 6 (six) hours as needed for pain.   polyethylene glycol 17 g packet Commonly known as: MiraLax Take 17 g by mouth daily as needed.   tamoxifen 20 MG tablet Commonly known as: NOLVADEX Take 1 tablet (20 mg total) by mouth daily.       Diagnostic  Studies: Dg Chest 2 View  Result Date: 11/10/2018 CLINICAL DATA:  Pre-op respiratory exam for lumbar spine surgery. Metastatic breast carcinoma. EXAM: CHEST - 2 VIEW COMPARISON:  09/26/2017 FINDINGS: The heart size and mediastinal contours are within normal limits. Both lungs are clear. The visualized skeletal structures are unremarkable. IMPRESSION: No active cardiopulmonary disease. Electronically Signed   By: Marlaine Hind M.D.   On: 11/10/2018 08:08   Dg Lumbar Spine 2-3 Views  Result Date: 11/12/2018 CLINICAL DATA:  LUMBAR TWO BIOPSY AND LUMBAR TWO KYPHOPLASTY Dr. Adonis Housekeeper [facility] OR room 4 Radiation safety timeout performed by Norva Riffle 3 minutes 7 seconds fluoro time 2 images and 2 dose summaries saved to PACS EXAM: LUMBAR SPINE - 2-3 VIEW; DG C-ARM 61-120 MIN COMPARISON:  05/06/2018 FINDINGS: Two images are submitted, showing lytic lesion at L2 and subsequent vertebral augmentation at this level, by history. IMPRESSION: L2 vertebral augmentation. Electronically Signed   By: Nolon Nations M.D.   On: 11/12/2018 14:44   Ct Chest W Contrast  Result Date: 10/27/2018 CLINICAL DATA:  Metastatic breast cancer. EXAM: CT CHEST, ABDOMEN, AND PELVIS WITH CONTRAST TECHNIQUE: Multidetector CT imaging of the chest, abdomen and pelvis was performed following the standard protocol during bolus administration of intravenous contrast. CONTRAST:  13mL OMNIPAQUE IOHEXOL 300 MG/ML  SOLN COMPARISON:  None FINDINGS: CT CHEST FINDINGS Cardiovascular: The heart size appears normal. No pericardial effusion. Mediastinum/Nodes: Normal appearance of the thyroid gland. The trachea appears patent and is midline. Normal appearance of the esophagus. No enlarged axillary or supraclavicular lymph nodes. No mediastinal or hilar adenopathy. Lungs/Pleura: No pleural effusion. 2 mm right upper lobe lung nodule is identified, nonspecific, image 42/6. Musculoskeletal: Multifocal bone metastases are identified. -index expansile lytic  lesion involving the anterior aspect of the left third rib measures 3.2 x 1.5 cm. -expansile lytic lesion involving the lateral aspect of the left 6 rib measures 3.3 x 1.7 cm, image 92/6. CT ABDOMEN PELVIS FINDINGS Hepatobiliary: Ill-defined low-density structure in right lobe of liver measures 1.3 cm, image 54/2. Indeterminate. Previous cholecystectomy. No biliary dilatation. Pancreas: Unremarkable. No pancreatic ductal dilatation or surrounding inflammatory changes. Spleen: Normal in size without focal abnormality. Adrenals/Urinary Tract: Normal appearance of the adrenal glands. The right kidney is normal. Stone within inferior pole of left kidney measures 6 mm, image 72/2. The urinary bladder is normal. Stomach/Bowel: Small hiatal hernia. No evidence of bowel wall thickening, distension or inflammation. The appendix appears normal. Vascular/Lymphatic: Normal appearance of the abdominal aorta. No aneurysm. No abdominal or pelvic adenopathy. Reproductive: Partially calcified uterine fibroid noted. Other: No ascites or focal fluid collections. Musculoskeletal: -Within the right iliac wing there is a lytic lesion measuring 3.3 cm, image 106/2. -large lytic lesion involving the L2 vertebra is identified. This measures 4.9 cm and extends into the posterior elements on the right, image 70/2. Secondary pathologic fracture of the L2 vertebra is identified.  There is mild canal encroachment on the right, image 68/2. IMPRESSION: 1. Multifocal lytic bone metastases are identified involving the left ribs, L2 vertebra, and right iliac wing. There is of pathologic fracture involving the L2 vertebra. 2. Single small, nonspecific pulmonary nodule in the right upper lobe is noted. 3. Ill-defined low-density structure in right lobe of liver measures 1.3 cm and is indeterminate. This may be better characterized with contrast enhanced liver MRI or PET-CT. Electronically Signed   By: Kerby Moors M.D.   On: 10/27/2018 09:50   Nm  Bone Scan Whole Body  Result Date: 10/28/2018 CLINICAL DATA:  Breast cancer restaging, symptomatic bone pain with intermittent lower back pain of 7 months. EXAM: NUCLEAR MEDICINE WHOLE BODY BONE SCAN TECHNIQUE: Whole body anterior and posterior images were obtained approximately 3 hours after intravenous injection of radiopharmaceutical. RADIOPHARMACEUTICALS:  21.5 mCi Technetium-35m MDP IV COMPARISON:  CT chest, abdomen, pelvis October 27, 2018 bone scan February 06, 1999 FINDINGS: Increased sites of osseous activity are present in the left anterolateral third and sixth ribs, right iliac crest, and L2 vertebral body with destructive osseous lesions seen in these locations the CT exam from day prior. Foci of activity in the shoulders, hands and feet have a degenerative distribution. Minimal nonspecific soft tissue uptake at the site of prior right lumpectomy. Expected radiotracer activity is seen within the bladder. Additional nonspecific activity in the soft tissues of the pelvis, right flank and right buttock. IMPRESSION: Sites of uptake in the left third and sixth ribs, right iliac crest and L2 vertebral body are compatible with sites of osseous metastatic disease with lesions visualized on CT from 1 day prior Non-specific soft tissue uptake is seen at the site of prior right lumpectomy. Likely urinary contamination in the soft tissues of the right pelvis, buttock and right flank. Electronically Signed   By: Lovena Le M.D.   On: 10/28/2018 20:35   Ct Abdomen Pelvis W Contrast  Result Date: 10/27/2018 CLINICAL DATA:  Metastatic breast cancer. EXAM: CT CHEST, ABDOMEN, AND PELVIS WITH CONTRAST TECHNIQUE: Multidetector CT imaging of the chest, abdomen and pelvis was performed following the standard protocol during bolus administration of intravenous contrast. CONTRAST:  175mL OMNIPAQUE IOHEXOL 300 MG/ML  SOLN COMPARISON:  None FINDINGS: CT CHEST FINDINGS Cardiovascular: The heart size appears normal. No  pericardial effusion. Mediastinum/Nodes: Normal appearance of the thyroid gland. The trachea appears patent and is midline. Normal appearance of the esophagus. No enlarged axillary or supraclavicular lymph nodes. No mediastinal or hilar adenopathy. Lungs/Pleura: No pleural effusion. 2 mm right upper lobe lung nodule is identified, nonspecific, image 42/6. Musculoskeletal: Multifocal bone metastases are identified. -index expansile lytic lesion involving the anterior aspect of the left third rib measures 3.2 x 1.5 cm. -expansile lytic lesion involving the lateral aspect of the left 6 rib measures 3.3 x 1.7 cm, image 92/6. CT ABDOMEN PELVIS FINDINGS Hepatobiliary: Ill-defined low-density structure in right lobe of liver measures 1.3 cm, image 54/2. Indeterminate. Previous cholecystectomy. No biliary dilatation. Pancreas: Unremarkable. No pancreatic ductal dilatation or surrounding inflammatory changes. Spleen: Normal in size without focal abnormality. Adrenals/Urinary Tract: Normal appearance of the adrenal glands. The right kidney is normal. Stone within inferior pole of left kidney measures 6 mm, image 72/2. The urinary bladder is normal. Stomach/Bowel: Small hiatal hernia. No evidence of bowel wall thickening, distension or inflammation. The appendix appears normal. Vascular/Lymphatic: Normal appearance of the abdominal aorta. No aneurysm. No abdominal or pelvic adenopathy. Reproductive: Partially calcified uterine fibroid noted. Other: No  ascites or focal fluid collections. Musculoskeletal: -Within the right iliac wing there is a lytic lesion measuring 3.3 cm, image 106/2. -large lytic lesion involving the L2 vertebra is identified. This measures 4.9 cm and extends into the posterior elements on the right, image 70/2. Secondary pathologic fracture of the L2 vertebra is identified. There is mild canal encroachment on the right, image 68/2. IMPRESSION: 1. Multifocal lytic bone metastases are identified involving the  left ribs, L2 vertebra, and right iliac wing. There is of pathologic fracture involving the L2 vertebra. 2. Single small, nonspecific pulmonary nodule in the right upper lobe is noted. 3. Ill-defined low-density structure in right lobe of liver measures 1.3 cm and is indeterminate. This may be better characterized with contrast enhanced liver MRI or PET-CT. Electronically Signed   By: Kerby Moors M.D.   On: 10/27/2018 09:50   Dg C-arm 1-60 Min  Result Date: 11/12/2018 CLINICAL DATA:  LUMBAR TWO BIOPSY AND LUMBAR TWO KYPHOPLASTY Dr. Adonis Housekeeper [facility] OR room 4 Radiation safety timeout performed by Norva Riffle 3 minutes 7 seconds fluoro time 2 images and 2 dose summaries saved to PACS EXAM: LUMBAR SPINE - 2-3 VIEW; DG C-ARM 61-120 MIN COMPARISON:  05/06/2018 FINDINGS: Two images are submitted, showing lytic lesion at L2 and subsequent vertebral augmentation at this level, by history. IMPRESSION: L2 vertebral augmentation. Electronically Signed   By: Nolon Nations M.D.   On: 11/12/2018 14:44   Ir Radiologist Eval & Mgmt  Result Date: 11/03/2018 Please refer to notes tab for details about interventional procedure. (Op Note)   Discharge Instructions    Incentive spirometry RT   Complete by: As directed       Follow-up Information    Melina Schools, MD. Schedule an appointment as soon as possible for a visit in 2 weeks.   Specialty: Orthopedic Surgery Why: If symptoms worsen, For suture removal, For wound re-check Contact information: [address] [place] [address] [phone]           Discharge Plan:  discharge to home  Disposition: stable    Signed: Yvonne Kendall Kele [place] for [facility] PA-C [facility] [phone] 11/16/2018, 1:16 PM

## 2018-11-17 ENCOUNTER — Telehealth: Payer: Self-pay | Admitting: Hematology

## 2018-11-17 NOTE — Telephone Encounter (Signed)
Called patient regarding upcoming Webex appointment, patient is notified and e-mail has been sent. °

## 2018-11-17 NOTE — Telephone Encounter (Signed)
Contacted patient to verify webex visit for pre reg °

## 2018-11-18 ENCOUNTER — Inpatient Hospital Stay: Payer: BC Managed Care – PPO | Admitting: Hematology

## 2018-11-18 ENCOUNTER — Telehealth: Payer: Self-pay | Admitting: Hematology

## 2018-11-18 NOTE — Telephone Encounter (Signed)
Left message for patient to verify webex visit for pre reg °

## 2018-11-18 NOTE — Progress Notes (Signed)
[place]   Telephone:([phone]) [phone] Fax:(336) [phone]   Clinic Follow up Note   Patient Care Team: Patient, No Pcp Per as PCP - General ([place]) Truitt Merle, MD as Consulting Physician (Hematology) [name], MD as Consulting Physician (General Surgery) [name], MD as Attending Physician (Radiation Oncology) [name], NP as Nurse Practitioner (Nurse Practitioner)   I connected with Caitlin Cohen on 11/19/2018 at  3:00 PM EDT by video enabled telemedicine visit and verified that I am speaking with the correct person using two identifiers.  I discussed the limitations, risks, security and privacy concerns of performing an evaluation and management service by telephone and the availability of in person appointments. I also discussed with the patient that there may be a patient responsible charge related to this service. The patient expressed understanding and agreed to proceed.   Patient's location:  Her home  Provider's location:  My office   CHIEF COMPLAINT: Follow upofright breastcancer   SUMMARY OF ONCOLOGIC HISTORY: Oncology History Overview Note  Cancer Staging Malignant neoplasm of upper-inner quadrant of right breast in female, estrogen receptor positive ([place]) Staging form: Breast, AJCC 8th Edition - Clinical stage from 06/26/2017: Stage IIA (cT2, cN0, cM0, G3, ER: Positive, PR: Positive, HER2: Negative) - Signed by Truitt Merle, MD on 07/01/2017 - Pathologic stage from 07/08/2017: Stage IB (pT2, pN0, cM0, G3, ER+, PR+, HER2-, Oncotype DX score: 29) - Signed by [name], NP on 07/25/2017     Malignant neoplasm of upper-inner quadrant of right breast in female, estrogen receptor positive ([place])  06/23/2017 Mammogram   IMPRESSION: 1. There is a highly suspicious mass measuring 3.5 cm mammographically in the right breast at the palpable site identified by the patient. 2. There is 1 suspicious lymph node with cortex measuring  up to 6 mm. 3.  No mammographic evidence of malignancy in the left breast.   06/26/2017 Initial Biopsy   Diagnosis 06/26/17 1. Breast, right, needle core biopsy, upper inner quadrant, 12:30 o'clock, 7cm from nipple - INVASIVE DUCTAL CARCINOMA - SEE COMMENT 2. Lymph node, needle/core biopsy, right axillary (level 2 node) - NO CARCINOMA IDENTIFIED IN ONE LYMPH NODE (0/1)   06/26/2017 Receptors her2   Prognostic indicators significant for: ER, 70% positive and PR, 70% positive, both with strong staining intensity. Proliferation marker Ki67 at 60%. HER2 negative.   07/01/2017 Initial Diagnosis   Malignant neoplasm of upper-inner quadrant of right breast in female, estrogen receptor positive ([place])   07/08/2017 Pathology Results   Diagnosis 1. Breast, lumpectomy, Right - INVASIVE DUCTAL CARCINOMA, NOTTINGHAM GRADE 3 OF 3, 3.5 CM - MARGINS UNINVOLVED BY CARCINOMA (0.1 CM, SUPERIOR MARGIN) - PREVIOUS BIOPSY SITE CHANGES - SEE ONCOLOGY TABLE BELOW 2. Soft tissue, biopsy, Axillary - BENIGN FIBROADIPOSE TISSUE - NO MALIGNANCY IDENTIFIED 3. Lymph node, sentinel, biopsy, Right axillary - NO CARCINOMA IDENTIFIED IN ONE LYMPH NODE (0/1) 4. Lymph node, sentinel, biopsy, Right axillary - NO CARCINOMA IDENTIFIED IN ONE LYMPH NODE (0/1) - PREVIOUS BIOPSY SITE CHANGES - SEE COMMENT 5. Breast, excision, Right superior margin - BENIGN BREAST TISSUE - NO RESIDUAL CARCINOMA IDENTIFIED 6. Breast, excision, Right inferior margin - BENIGN BREAST TISSUE - NO RESIDUAL CARCINOMA IDENTIFIED   07/08/2017 Cancer Staging   Staging form: Breast, AJCC 8th Edition - Pathologic stage from 07/08/2017: Stage IB (pT2, pN0, cM0, G3, ER+, PR+, HER2-, Oncotype DX score: 29) - Signed by [name], NP on 07/25/2017   07/22/2017 Oncotype testing   Recurrence Score:  29 Distant Recurrence Risk at 9 years with Ai or Tamoxifen alone: 18% Absolute Chemotherapy Benefit: > 15 %   08/08/2017 -  Chemotherapy   Adjuvant chemo  Taxotere/Cytoxanq3 weeks x4 cycles with Udenyca -Granix added from cycle 2. Discontinued Udenyca following cycle 2.    11/10/2017 - 12/08/2017 Radiation Therapy   Radiation treatment dates:   11/10/2017 to 12/08/2017  Site/dose:    1. The Right breast was treated to 40.05 Gy in 15 fractions of 2.67 Gy. 2. The Right breast was boosted to 10 Gy in 5 fractions of 2 Gy.  Beams/energy:    1. 3D// 10X 2. Isodose plan w/photons // 10X, 6X  Narrative: The patient tolerated radiation treatment relatively well.   She developed moderate hyperpigmentation to the right breast with treatment. She denies any pain or fatigue. She continues to use radiaplex twice daily as directed.    12/2017 -  Anti-estrogen oral therapy   started Tamoxifen 1m daily in 12/2017   08/11/2018 Survivorship   SCP virtual visit per LCira Rue NP    10/27/2018 Imaging   CT CAP W Contrast 10/27/18  IMPRESSION: 1. Multifocal lytic bone metastases are identified involving the left ribs, L2 vertebra, and right iliac wing. There is of pathologic fracture involving the L2 vertebra. 2. Single small, nonspecific pulmonary nodule in the right upper lobe is noted. 3. Ill-defined low-density structure in right lobe of liver measures 1.3 cm and is indeterminate. This may be better characterized with contrast enhanced liver MRI or PET-CT.   10/28/2018 Imaging   Whole Body Bone Scan 10/28/18  IMPRESSION: Sites of uptake in the left third and sixth ribs, right iliac crest and L2 vertebral body are compatible with sites of osseous metastatic disease with lesions visualized on CT from 1 day prior   Non-specific soft tissue uptake is seen at the site of prior right lumpectomy.   Likely urinary contamination in the soft tissues of the right pelvis, buttock and right flank.   11/12/2018 Relapse/Recurrence   Diagnosis 1. Bone, biopsy, Lumbar 2, Left Pedicle - BONE WITH MARROW FIBROSIS. - NO MALIGNANCY IDENTIFIED. 2. Bone,  biopsy, Lumbar 2, Right Pedicle - METASTATIC CARCINOMA, SEE COMMENT.  The tumor cells are NEGATIVE for Her2 (0). Estrogen Receptor: 0%, NEGATIVE Progesterone Receptor: 0%, NEGATIVE      CURRENT THERAPY:  Pending Target RT to L2  INTERVAL HISTORY:  Caitlin Murleyis here for a follow up. They identified themselves by face to face video. She note her kyphoplasty went well. She feels has back pain has improved. She still has difficulty standing and doing activities. She uses a cane currently. She will f/u with Dr. BRolena Infanteon 8/14. She has not been recommended to PT. For her pain she is taking oxycodone 2-3 times a day.    REVIEW OF SYSTEMS:   Constitutional: Denies fevers, chills or abnormal weight loss Eyes: Denies blurriness of vision Ears, nose, mouth, throat, and face: Denies mucositis or sore throat Respiratory: Denies cough, dyspnea or wheezes Cardiovascular: Denies palpitation, chest discomfort or lower extremity swelling Gastrointestinal:  Denies nausea, heartburn or change in bowel habits Skin: Denies abnormal skin rashes MSK: (+) Back pain improved, ambulates with cane  Lymphatics: Denies new lymphadenopathy or easy bruising Neurological:Denies numbness, tingling or new weaknesses Behavioral/Psych: Mood is stable, no new changes  All other systems were reviewed with the patient and are negative.  MEDICAL HISTORY:  Past Medical History:  Diagnosis Date  . Anemia   . Anxiety   .  Cancer ([place]) dx'd 06/2017   breast cancer right  . Depression   . Diabetes mellitus without complication ([place])   . GERD (gastroesophageal reflux disease)   . Headache   . History of radiation therapy 11/10/17- 12/08/17   right breast, 40.05 Gy in 15 fractions. Right breast boost 10 Gy in 5 fractions.   . Personal history of chemotherapy   . Personal history of radiation therapy     SURGICAL HISTORY: Past Surgical History:  Procedure Laterality Date  . CERVICAL CONE BIOPSY    .  CHOLECYSTECTOMY    . IR RADIOLOGIST EVAL & MGMT  11/03/2018  . KYPHOPLASTY N/A 11/12/2018   Procedure: LUMBAR TWO  BIOPSY AND LUMBAR TWO KYPHOPLASTY;  Surgeon: Melina Schools, MD;  Location: [place] [facility];  Service: Orthopedics;  Laterality: N/A;  90 mins  . MASTECTOMY WITH RADIOACTIVE SEED GUIDED EXCISION AND AXILLARY SENTINEL LYMPH NODE BIOPSY Right 07/08/2017   Procedure: RIGHT LUMPTECTOMY WITH RADIOACTIVE SEED GUIDED EXCISION AND RIGHT AXILLARY SENTINEL LYMPH NODE BIOPSY ERAS PATHWAY;  Surgeon: [name], MD;  Location: [facility];  Service: General;  Laterality: Right;  PEC BLOCK  . OPEN REDUCTION INTERNAL FIXATION (ORIF) DISTAL RADIAL FRACTURE  03/17/2012   Procedure: OPEN REDUCTION INTERNAL FIXATION (ORIF) DISTAL RADIAL FRACTURE;  Surgeon: Roseanne Kaufman, MD;  Location: [facility];  Service: Orthopedics;  Laterality: Right;  Pre-operative a supra-clavical block right arm in addition to general anesthesia  . PORT-A-CATH REMOVAL Right 02/02/2018   Procedure: REMOVAL PORT-A-CATH;  Surgeon: [name], MD;  Location: [facility];  Service: General;  Laterality: Right;  . PORTACATH PLACEMENT N/A 07/30/2017   Procedure: INSERTION PORT-A-CATH WITH [place];  Surgeon: [name], MD;  Location: [facility];  Service: General;  Laterality: N/A;  . TUBAL LIGATION      I have reviewed the social history and family history with the patient and they are unchanged from previous note.  ALLERGIES:  has No Known Allergies.  MEDICATIONS:  Current Outpatient Medications  Medication Sig Dispense Refill  . gabapentin (NEURONTIN) 100 MG capsule Take 1 capsule in the morning and 3 capsules at bedtime (Patient taking differently: Take 300 mg by mouth 3 (three) times daily. ) 360 capsule 1  . metFORMIN (GLUCOPHAGE) 500 MG tablet Take 1 tablet (500 mg total) by mouth 2 (two) times daily with a meal. 180 tablet 1  . morphine (MS CONTIN) 30 MG 12 hr tablet Take 1 tablet (30 mg total) by mouth every 12  (twelve) hours. 60 tablet 0  . ondansetron (ZOFRAN) 8 MG tablet Take 1 tablet (8 mg total) by mouth every 8 (eight) hours as needed for nausea or vomiting. 20 tablet 0  . oxyCODONE (OXY IR/ROXICODONE) 5 MG immediate release tablet Take 1-2 tablets (5-10 mg total) by mouth every 6 (six) hours as needed for severe pain. 90 tablet 0  . oxyCODONE-acetaminophen (PERCOCET) 10-325 MG tablet Take 1 tablet by mouth every 6 (six) hours as needed for pain. (Patient not taking: Reported on 11/05/2018) 30 tablet 0  . polyethylene glycol (MIRALAX) 17 g packet Take 17 g by mouth daily as needed. 14 each 8  . tamoxifen (NOLVADEX) 20 MG tablet Take 1 tablet (20 mg total) by mouth daily. (Patient not taking: Reported on 11/05/2018) 90 tablet 3   No current facility-administered medications for this visit.     PHYSICAL EXAMINATION: ECOG PERFORMANCE STATUS: 2 - Symptomatic, <50% confined to bed  No vitals taken today, Exam not performed today   LABORATORY DATA:  I have reviewed the data as listed CBC Latest Ref Rng & Units 11/09/2018 10/26/2018 06/19/2018  WBC 4.0 - 10.5 K/uL 9.4 7.8 6.4  Hemoglobin 12.0 - 15.0 g/dL 12.7 13.3 11.7(L)  Hematocrit 36.0 - 46.0 % 39.3 41.2 36.8  Platelets 150 - 400 K/uL 268 253 240     CMP Latest Ref Rng & Units 11/09/2018 10/26/2018 06/19/2018  Glucose 70 - 99 mg/dL 107(H) 109(H) 92  BUN 6 - 20 mg/dL _0 Creatinine 0.44 - 1.00 mg/dL 0.89 0.83 0.92  Sodium 135 - 145 mmol/L 140 141 141  Potassium 3.5 - 5.1 mmol/L 3.4(L) 4.0 3.5  Chloride 98 - 111 mmol/L 105 106 108  CO2 22 - 32 mmol/L _1 Calcium 8.9 - 10.3 mg/dL 9.2 9.1 8.9  Total Protein 6.5 - 8.1 g/dL - 7.6 7.2  Total Bilirubin 0.3 - 1.2 mg/dL - <0.2(L) 0.6  Alkaline Phos 38 - 126 U/L - 82 57  AST 15 - 41 U/L - 15 16  ALT 0 - 44 U/L - 13 12      RADIOGRAPHIC STUDIES: I have personally reviewed the radiological images as listed and agreed with the findings in the report. No results found.   ASSESSMENT &  PLAN:  Caitlin Cohen is a 53 y.o. female with   1. 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 --Her 09/2018 MRI spine was concerning for bone metastasis and L2 spine fracture, likely pathologic.  -CT CAP from 10/27/18 and bone scan from 10/28/18 which show uptake in left 3rd and 6th ribs, right iliac crest and large 4.9cm lytic lesion in L2 with pathologic fracture.  -She underwent L2 Kyphoplasty and biopsy by Dr. Rolena Infante on 11/12/18.  -Her back pain has improved after kyphoplasty. She ambulated with cane. I suggest she request PT referral from her surgeon.  -She is currently on long acting MS Contin 74m BID and oxycodone 5-145mno more than 2-3 times a day. I encouraged her to use Tylenol if she needs to reduce her need for oxycodone. -She will continue Gabapentin as needed.  -I discussed her L2 bone biopsy from 11/12/18 which shows metastatic breast cancer with triple negative markers. Her breast cancer in 2019 was ER+/PR+/HER2-. I spoke with pathology Dr. KiLyndon Codend we discussed the possibility of false negative ER/PR/HER2 during decalcification process. Given treatment for triple negative and ER/PR+/HER2- cancer is very different, I recommend right iliac bone biopsy without decalcification so we can repeat ER/PR/HER2. She is agreeable.  -She can still proceed with palliative spine RT in the meantime. I will refer her to Dr. SqIsidore Moos 2. 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 She is s/p right breastlumpectomy, adjuvant TC and adjuvant radiation.  -She started anti-estrogen therapy with Tamoxifen in 12/2017.Tolerating well with manageable hot flashes. Due to recent metastasis, Tamoxifen was stopped in 11/2018.  -Based on recent CT CAP/bone scan  she has bone lesions highly concerning for bone metastasis. -Given she has metastasis, her cancer is stage IV and no longer curable. Her cancer is still treatable to control her disease. Her systemic treatment will be based on her ER/PR/HER2 status  -Will start her with palliative spine RT in the meantime with Dr. SqIsidore Moos -Her recent CT  scan also showed single small, nonspecific pulmonary nodule in the right upper, will monitor. There is also Ill-defined low-density structure in right lobe of liver measures 1.3 cm and is indeterminate. will continue monitoring  3. Depression/Anxiety  -She has a hx of depression and was previously treated with medication. Not currently on medication and she feels that her depression is controlled. -due to her probable metastatic disease, he is overwhelmed and crying during her visit, she has good support from her sister and children  -I previously offered her the chance to talk to our SW about her current state. She declined for now.     Plan -I refilled Zofran today  -IR CT biopsy of right iliac bone in 1-2 weeks, I spoke with Dr. Annamaria Boots about avoiding decalcification of biopsy sample  -Refer to Rad Onc Dr. Isidore Moos -f/u after next biopsy    No problem-specific Assessment & Plan notes found for this encounter.   Orders Placed This Encounter  Procedures  . CT Biopsy    Standing Status:   Future    Standing Expiration Date:   11/19/2019    Scheduling Instructions:     Please schedule with Dr. Annamaria Boots ASAP for right iliac bone lesion biopsy, the biopsy sample should avoid decalcification for molecular testing ER/PR/HER2, thanks    Order Specific Question:   Lab orders requested (DO NOT place separate lab orders, these will be automatically ordered during procedure specimen collection):    Answer:   Surgical Pathology    Order Specific Question:   Reason for Exam (SYMPTOM  OR DIAGNOSIS REQUIRED)    Answer:   confirm diagnosis and type of breast cancer     Order Specific Question:   Is patient pregnant?    Answer:   No    Order Specific Question:   Preferred location?    Answer:   [facility]    Order Specific Question:   Radiology Contrast Protocol - do NOT remove file path    Answer:   [email or web address]   I discussed the assessment and treatment plan with the patient. The patient was provided an opportunity to ask questions and all were answered. The patient agreed with the plan and demonstrated an understanding of the instructions.  The patient was advised to call back or seek an in-person evaluation if the symptoms worsen or if the condition fails to improve as anticipated.  I provided 25 minutes of face-to-face video visit time during this encounter, and > 50% was spent counseling as documented under my assessment & plan.    Truitt Merle, MD 11/19/2018   I, Joslyn Devon, am acting as scribe for Truitt Merle, MD.   I have reviewed the above documentation for accuracy and completeness, and I agree with the above.

## 2018-11-19 ENCOUNTER — Encounter: Payer: Self-pay | Admitting: Hematology

## 2018-11-19 ENCOUNTER — Inpatient Hospital Stay: Payer: BC Managed Care – PPO | Attending: Hematology | Admitting: Hematology

## 2018-11-19 DIAGNOSIS — Z79899 Other long term (current) drug therapy: Secondary | ICD-10-CM | POA: Insufficient documentation

## 2018-11-19 DIAGNOSIS — Z170 Estrogen receptor positive status [ER+]: Secondary | ICD-10-CM | POA: Insufficient documentation

## 2018-11-19 DIAGNOSIS — Z7984 Long term (current) use of oral hypoglycemic drugs: Secondary | ICD-10-CM | POA: Insufficient documentation

## 2018-11-19 DIAGNOSIS — C50211 Malignant neoplasm of upper-inner quadrant of right female breast: Secondary | ICD-10-CM | POA: Diagnosis not present

## 2018-11-19 DIAGNOSIS — Z79810 Long term (current) use of selective estrogen receptor modulators (SERMs): Secondary | ICD-10-CM | POA: Insufficient documentation

## 2018-11-19 DIAGNOSIS — C7951 Secondary malignant neoplasm of bone: Secondary | ICD-10-CM | POA: Diagnosis not present

## 2018-11-19 DIAGNOSIS — F419 Anxiety disorder, unspecified: Secondary | ICD-10-CM | POA: Insufficient documentation

## 2018-11-19 DIAGNOSIS — E119 Type 2 diabetes mellitus without complications: Secondary | ICD-10-CM | POA: Insufficient documentation

## 2018-11-19 DIAGNOSIS — Z923 Personal history of irradiation: Secondary | ICD-10-CM | POA: Insufficient documentation

## 2018-11-19 DIAGNOSIS — R110 Nausea: Secondary | ICD-10-CM

## 2018-11-19 DIAGNOSIS — Z9221 Personal history of antineoplastic chemotherapy: Secondary | ICD-10-CM | POA: Insufficient documentation

## 2018-11-19 MED ORDER — ONDANSETRON HCL 8 MG PO TABS: 8.0000 mg | ORAL_TABLET | Freq: Three times a day (TID) | ORAL | 0 refills | Status: DC | PRN

## 2018-11-23 NOTE — Progress Notes (Signed)
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   Location(s) of Symptomatic Metastases: Spinal metastasis.   Past/Anticipated chemotherapy by medical oncology, if any:  11/19/18 Dr. Burr Medico ASSESSMENT & PLAN:  Caitlin Cohen is a 53 y.o. 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 --Her 09/2018 MRI spine was concerning for bone metastasisand L2 spine fracture, likely pathologic. -CT CAP from 10/27/18 and bone scan from 7/15/20whichshowuptake in left 3rd and 6th ribs, right iliac crest andlarge 4.9cm lytic lesion inL2 with pathologic fracture.  -She underwent L2 Kyphoplasty and biopsy by Dr. Rolena Infante on 11/12/18.  -Her back pain has improved after kyphoplasty. She ambulated with cane. I suggest she request PT referral from her surgeon.  -She is currently on long acting MS Contin67mBID and oxycodone 5-179mno more than 2-3 times a day. I encouraged her to use Tylenol if she needs to reduce her need for oxycodone. -She will continue Gabapentin as needed.  -I discussed her L2 bone biopsy from 11/12/18 which shows metastatic breast cancer with triple negative markers. Her breast cancer in 2019 was ER+/PR+/HER2-. I spoke with pathology Dr. KiLyndon Codend we discussed the possibility of false negative ER/PR/HER2 during decalcification process. Given treatment for triple negative and ER/PR+/HER2- cancer is very different, I recommend right iliac bone biopsy without decalcification so we can repeat ER/PR/HER2. She is agreeable.  -She can still proceed with palliative spine RT in the meantime. I will refer her to Dr. SqIsidore Moos 2.  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 She is s/p right breastlumpectomy, adjuvant TC and adjuvant radiation.  -She started anti-estrogen therapy with Tamoxifen in 12/2017.Tolerating well with manageable hot flashes. Due to recent metastasis, Tamoxifen was stopped in 11/2018.  -Based on recent CT CAP/bone scan she has bone lesions highly concerning for bone metastasis. -Given she has metastasis, her cancer is stage IV and no longer curable. Her cancer is still treatable to control her disease. Her systemic treatment will be based on her ER/PR/HER2 status  -Will start her with palliative spine RT in the meantime with Dr. SqIsidore Moos -Her recent CT scan also showed single small, nonspecific pulmonary nodule in the right upper, will monitor. There is alsoIll-defined low-density structure in right lobe of liver measures 1.3 cm and is indeterminate.will continue monitoring  3. Depression/Anxiety  -She has a hx of depression and was previously treated with medication. Not currently on medication and she feels that her depression is controlled. -due to herprobable metastatic disease, he is overwhelmed and crying during hervisit, she has good supportfrom her sister and children -I previously offered her the chance to talk to our SW about her current state. She declined for now.  Plan -I refilled Zofran today  -IR CT biopsy of right iliac bone in 1-2 weeks, I spoke with Dr. ShAnnamaria Bootsbout avoiding decalcification of biopsy sample  -Refer to Rad Onc Dr. SqIsidore Moosf/u after next biopsy     Pain on a scale of 0-10 is: 5-6/10 to  her back   If Spine Met(s), symptoms, if any, include:  Bowel/Bladder retention or incontinence (please describe): She reports constipation related to pain medicine.   Numbness or weakness in extremities (please describe):  She has weakness to her bilateral legs  Current Decadron regimen, if applicable: N/A  Ambulatory status? Walker? Wheelchair?: She is using a cane.   SAFETY ISSUES:  Prior radiation? Yes,  Radiation treatment dates:   11/10/2017 to 12/08/2017 Site/dose:    1. The Right breast was treated to 40.05 Gy in 15 fractions of 2.67 Gy. 2. The Right breast was boosted to 10 Gy in 5 fractions of 2 Gy  Pacemaker/ICD? No  Possible current pregnancy? No  Is the patient on methotrexate? No  Current Complaints / other details:    BP 101/70 (BP Location: Left Arm)   Pulse (!) 116   Temp 98.6 F (37 C) (Oral)   Wt 291 lb (132 kg)   SpO2 98%   BMI 45.58 kg/m    Wt Readings from Last 3 Encounters:  11/25/18 291 lb (132 kg)  11/12/18 294 lb (133.4 kg)  11/09/18 294 lb (133.4 kg)

## 2018-11-25 ENCOUNTER — Other Ambulatory Visit: Payer: Self-pay

## 2018-11-25 ENCOUNTER — Ambulatory Visit
Admission: RE | Admit: 2018-11-25 | Discharge: 2018-11-25 | Disposition: A | Discharge status: Home / Self Care | Payer: BC Managed Care – PPO | Source: Ambulatory Visit | Attending: Radiation Oncology | Admitting: Radiation Oncology

## 2018-11-25 ENCOUNTER — Encounter: Payer: Self-pay | Admitting: Radiation Oncology

## 2018-11-25 VITALS — BP 101/70 | HR 116 | Temp 98.6°F | Wt 291.0 lb

## 2018-11-25 DIAGNOSIS — Z9221 Personal history of antineoplastic chemotherapy: Secondary | ICD-10-CM | POA: Diagnosis not present

## 2018-11-25 DIAGNOSIS — C50211 Malignant neoplasm of upper-inner quadrant of right female breast: Secondary | ICD-10-CM | POA: Diagnosis not present

## 2018-11-25 DIAGNOSIS — F418 Other specified anxiety disorders: Secondary | ICD-10-CM | POA: Diagnosis not present

## 2018-11-25 DIAGNOSIS — C7951 Secondary malignant neoplasm of bone: Secondary | ICD-10-CM | POA: Diagnosis present

## 2018-11-25 DIAGNOSIS — Z510 Encounter for antineoplastic radiation therapy: Secondary | ICD-10-CM | POA: Insufficient documentation

## 2018-11-25 DIAGNOSIS — K5903 Drug induced constipation: Secondary | ICD-10-CM | POA: Insufficient documentation

## 2018-11-25 DIAGNOSIS — C50411 Malignant neoplasm of upper-outer quadrant of right female breast: Secondary | ICD-10-CM | POA: Insufficient documentation

## 2018-11-25 DIAGNOSIS — Z170 Estrogen receptor positive status [ER+]: Secondary | ICD-10-CM | POA: Insufficient documentation

## 2018-11-25 DIAGNOSIS — R531 Weakness: Secondary | ICD-10-CM | POA: Insufficient documentation

## 2018-11-25 DIAGNOSIS — T402X5A Adverse effect of other opioids, initial encounter: Secondary | ICD-10-CM | POA: Diagnosis not present

## 2018-11-25 DIAGNOSIS — Z923 Personal history of irradiation: Secondary | ICD-10-CM | POA: Insufficient documentation

## 2018-11-26 ENCOUNTER — Encounter: Payer: Self-pay | Admitting: General Practice

## 2018-11-26 NOTE — Progress Notes (Addendum)
[place] Initial Psychosocial Assessment Clinical Social Work  Clinical Social Work contacted by phone to assess psychosocial, emotional, mental health, and spiritual needs of the patient.   Barriers to care/review of distress screen:  - Transportation:  Do you anticipate any problems getting to appointments?  Do you have someone who can help run errands for you if you need it?  Children help w errands and transportation but have their own jobs.  Needs referral to [facility] transport and also informed about Medicaid transport option.   - Help at home:  What is your living situation (alone, family, other)?  If you are physically unable to care for yourself, who would you call on to help you?  Currently lives w friend, but now questioning level of support available from roommate. "I try not to put too much on anybody."   - Support system:  What does your support system look like?  Who would you call on if you needed some kind of practical help?  What if you needed someone to talk to for emotional support?  Adult children are helpful, "I dont express the help I need because I dont want to put too much on them."  For example, has been hesitant to ask for support w meals - has been unable to stand due to pain - cannot prepare own meals.  Had back fracture which creates significant pain w standing.  Children are willing to help.  Has a couple of friends she reaches out to for emotional support.   - Finances:  Are you concerned about finances:.  Considering returning to work?  If not, applying for disability?  Employer [employer]) has short term disability, also referred to [employer] for Social Security disability.    What is your understanding of where you are with your cancer? Its cause?  Your treatment plan and what happens next?  Newly diagnosed w Stage IV breast cancer, initially diagnosed w [facility] in March 2019.  Understands it is now in her bones and is "curable but not treatable."  Hoped to "bounce back  quicker", but "now I realize I will be in pain and and wont be able to do what I used to be able to." Easily overwhelmed by tiredness, "I cant even wash dishes now because I cant stand long."  Some of the "daily routines" that use to provide structure and sense of independence. Grieving loss of ability to return to work.    What are your worries for the future as you begin treatment for cancer?  Pain, increased disability, cant do what she used to be able to.  Fatigue and pain. "I am concerned about 'how long I have', I try not to think about that, it is a concern."    What are your hopes and priorities during your treatment? What is important to you? What are your goals for your care?  I want to be able to spend time w my grandchildren. I want to see them grow up.  Short term goal - oldest granddaughter turns 6 next month, want to be able to attend the celebration.  Has been able to spend w daughters and grandchildren, this is very important to her.  What are you willing to sacrifice during your treatment?  Willing to do anything in terms of treatment, have "a lot of faith in the doctors I've been dealing with", confident in proposed treatment plan.    What are you NOT willing to sacrifice during your treatment?  Time w  family/children/grandchildren   CSW Summary:  Patient and family psychosocial functioning including strengths, limitations, and coping skills:  Strong confidence in oncologist and team.  Appreciates communication and follow through.  "Never rushed when Im talking w her, she takes time and answers questions."  Has dealt with depression in the past, "Im not getting down like I was before - I dont know if Im just overwhelmed right now."  Does notice a decrease in mood, more isolation (some due to [place]).  "Trying not to get in that dark place I was before."  Had a hard time w antidepressants and finding one that was actually helpful.  "If Im taking medicine and its not helping, I wont  take it."  Was going to [employer] on [address].  Good family support, somewhat isolated from friends/community due to [place].    Identifications of barriers to care:  Will not return to work at this point, no income.  Has [place] Medicaid, will lose BCBS she anticipates.  Applying for employer sponsored short term disability, no long term disability coverage.    Availability of community resources: Has been cared for at [facility] - patient will schedule appt for meds mgmt w this provider.     Clinical Social Worker follow up needed: Yes.     Plan:  Armed forces operational officer for [employer], Network engineer, Solicitor.  Email links to [place] for PCP, [place] and [facility] calendar.  CSW will check in w patient at future appt within one month.

## 2018-11-30 ENCOUNTER — Encounter: Payer: Self-pay | Admitting: Radiation Oncology

## 2018-11-30 DIAGNOSIS — C7951 Secondary malignant neoplasm of bone: Secondary | ICD-10-CM | POA: Insufficient documentation

## 2018-11-30 NOTE — Progress Notes (Signed)
  Radiation Oncology         [phone]) [phone] ________________________________  Name: Caitlin Cohen MRN: [id number]  Date: 11/25/2018  DOB: 1965-09-19  SIMULATION AND TREATMENT PLANNING NOTE  Outpatient  DIAGNOSIS:     ICD-10-CM   1. Bone metastases ([place])  C79.51     NARRATIVE:  The patient was brought to the [facility].  Identity was confirmed.  All relevant records and images related to the planned course of therapy were reviewed.  The patient freely provided informed written consent to proceed with treatment after reviewing the details related to the planned course of therapy. The consent form was witnessed and verified by the simulation staff.    Then, the patient was set-up in a stable reproducible  supine position for radiation therapy.  CT images were obtained.  Surface markings were placed.  The CT images were loaded into the planning software.    TREATMENT PLANNING NOTE: Treatment planning then occurred.  The radiation prescription was entered and confirmed.    A total of 5 medically necessary complex treatment devices were fabricated and supervised by me, in the form of 4 fields with MLCs to block bowel,  Esophagus, lung; and, a Vaclok. MORE FIELDS WITH MLCs MAY BE ADDED IN DOSIMETRY for dose homogeneity.  I have requested : 3D Simulation  I have requested a DVH of the following structures: bowel, lung, esophagus, targets.    The patient will receive 30 Gy in 10 fractions to the left rib lesions (3rd, 6th ribs) and L1-3 spine.   -----------------------------------  [name], MD

## 2018-11-30 NOTE — Progress Notes (Signed)
Radiation Oncology         (336) [phone] ________________________________  Outpatient re-Consultation in person  Name: Caitlin Cohen MRN: [id number]  Date: 11/25/2018  DOB: 04-Mar-1966  GM:[id number], No Pcp Per  Truitt Merle, MD   REFERRING PHYSICIAN: Truitt Merle, MD  DIAGNOSIS:    ICD-10-CM   1. Spine metastasis ([place])  C79.51   2. Bone metastases ([place])  C79.51     CHIEF COMPLAINT: Here to discuss management of metastatic breast cancer  HISTORY OF PRESENT ILLNESS::Caitlin Cohen is a 53 y.o. female who is well-known by me.  Unfortunately she has developed metastatic disease to multiple bony sites.  This includes L2, the third and sixth left ribs, and the right iliac bone.  She denies any pain in the right iliac bone but she has significant pain in her lumbar spine and underwent kyphoplasty there.  Pain improved after kyphoplasty. She also has pain corresponding with the rib lesions.  To determine the receptor status with confidence she is undergoing a biopsy of the right iliac bone in the near future.  Paradoxically the biopsy specimen from her spine showed different receptor status than her previous breast cancer.  I have reviewed the bone scan performed on July 15 that demonstrates her bony metastatic lesions as well as the CT of her chest abdomen and pelvis from July 14.  According to her imaging there is also possible metastatic disease in her liver  Systemic therapy is pending   PREVIOUS RADIATION THERAPY: Yes  1. The Right breast was treated to 40.05 Gy in 15 fractions of 2.67 Gy. 2. The Right breast was boosted to 10 Gy in 5 fractions of 2 Gy   PAST MEDICAL HISTORY:  has a past medical history of Anemia, Anxiety, Cancer ([place]) (dx'd 06/2017), Depression, Diabetes mellitus without complication ([place]), GERD (gastroesophageal reflux disease), Headache, History of radiation therapy (11/10/17- 12/08/17), Personal history of chemotherapy, and Personal history of radiation therapy.     PAST SURGICAL HISTORY: Past Surgical History:  Procedure Laterality Date  . CERVICAL CONE BIOPSY    . CHOLECYSTECTOMY    . IR RADIOLOGIST EVAL & MGMT  11/03/2018  . KYPHOPLASTY N/A 11/12/2018   Procedure: LUMBAR TWO  BIOPSY AND LUMBAR TWO KYPHOPLASTY;  Surgeon: Melina Schools, MD;  Location: [facility];  Service: Orthopedics;  Laterality: N/A;  90 mins  . MASTECTOMY WITH RADIOACTIVE SEED GUIDED EXCISION AND AXILLARY SENTINEL LYMPH NODE BIOPSY Right 07/08/2017   Procedure: RIGHT LUMPTECTOMY WITH RADIOACTIVE SEED GUIDED EXCISION AND RIGHT AXILLARY SENTINEL LYMPH NODE BIOPSY ERAS PATHWAY;  Surgeon: [name], MD;  Location: [facility];  Service: General;  Laterality: Right;  PEC BLOCK  . OPEN REDUCTION INTERNAL FIXATION (ORIF) DISTAL RADIAL FRACTURE  03/17/2012   Procedure: OPEN REDUCTION INTERNAL FIXATION (ORIF) DISTAL RADIAL FRACTURE;  Surgeon: Roseanne Kaufman, MD;  Location: [facility];  Service: Orthopedics;  Laterality: Right;  Pre-operative a supra-clavical block right arm in addition to general anesthesia  . PORT-A-CATH REMOVAL Right 02/02/2018   Procedure: REMOVAL PORT-A-CATH;  Surgeon: [name], MD;  Location: [facility];  Service: General;  Laterality: Right;  . PORTACATH PLACEMENT N/A 07/30/2017   Procedure: INSERTION PORT-A-CATH WITH [place];  Surgeon: [name], MD;  Location: [facility];  Service: General;  Laterality: N/A;  . TUBAL LIGATION      FAMILY HISTORY: family history includes Diabetes in her father; Hypertension in her father and mother.  SOCIAL HISTORY:  reports that she has never smoked. She has never used  smokeless tobacco. She reports current alcohol use. She reports that she does not use drugs.  ALLERGIES: Patient has no known allergies.  MEDICATIONS:  Current Outpatient Medications  Medication Sig Dispense Refill  . gabapentin (NEURONTIN) 100 MG capsule Take 1 capsule in the morning and 3 capsules at bedtime (Patient taking differently: Take 300 mg  by mouth 3 (three) times daily. ) 360 capsule 1  . metFORMIN (GLUCOPHAGE) 500 MG tablet Take 1 tablet (500 mg total) by mouth 2 (two) times daily with a meal. 180 tablet 1  . morphine (MS CONTIN) 30 MG 12 hr tablet Take 1 tablet (30 mg total) by mouth every 12 (twelve) hours. 60 tablet 0  . ondansetron (ZOFRAN) 8 MG tablet Take 1 tablet (8 mg total) by mouth every 8 (eight) hours as needed for nausea or vomiting. 20 tablet 0  . oxyCODONE (OXY IR/ROXICODONE) 5 MG immediate release tablet Take 1-2 tablets (5-10 mg total) by mouth every 6 (six) hours as needed for severe pain. 90 tablet 0  . polyethylene glycol (MIRALAX) 17 g packet Take 17 g by mouth daily as needed. 14 each 8  . tamoxifen (NOLVADEX) 20 MG tablet Take 1 tablet (20 mg total) by mouth daily. (Patient not taking: Reported on 11/05/2018) 90 tablet 3   No current facility-administered medications for this encounter.     REVIEW OF SYSTEMS:  Notable for that above.   PHYSICAL EXAM:  weight is 291 lb (132 kg). Her oral temperature is 98.6 F (37 C). Her blood pressure is 101/70 and her pulse is 116 (abnormal). Her oxygen saturation is 98%.   Tenderness to palpation in the thorax corresponding to her rib lesions.  No tenderness to palpation in the right pelvic/hip region.  ECOG = 1  0 - Asymptomatic (Fully active, able to carry on all predisease activities without restriction)  1 - Symptomatic but completely ambulatory (Restricted in physically strenuous activity but ambulatory and able to carry out work of a light or sedentary nature. For example, light housework, office work)  2 - Symptomatic, <50% in bed during the day (Ambulatory and capable of all self care but unable to carry out any work activities. Up and about more than 50% of waking hours)  3 - Symptomatic, >50% in bed, but not bedbound (Capable of only limited self-care, confined to bed or chair 50% or more of waking hours)  4 - Bedbound (Completely disabled. Cannot carry  on any self-care. Totally confined to bed or chair)  5 - Death   Eustace Pen MM, Creech RH, Tormey DC, et al. [phone]). "Toxicity and response criteria of the [facility] Group". [place] Oncol. 5 (6): 649-55   LABORATORY DATA:  Lab Results  Component Value Date   WBC 9.4 11/09/2018   HGB 12.7 11/09/2018   HCT 39.3 11/09/2018   MCV 85.8 11/09/2018   PLT 268 11/09/2018   CMP     Component Value Date/Time   NA 140 11/09/2018 1447   K 3.4 (L) 11/09/2018 1447   CL 105 11/09/2018 1447   CO2 24 11/09/2018 1447   GLUCOSE 107 (H) 11/09/2018 1447   BUN 10 11/09/2018 1447   CREATININE 0.89 11/09/2018 1447   CREATININE 0.83 10/26/2018 1101   CALCIUM 9.2 11/09/2018 1447   PROT 7.6 10/26/2018 1101   ALBUMIN 3.6 10/26/2018 1101   AST 15 10/26/2018 1101   ALT 13 10/26/2018 1101   ALKPHOS 82 10/26/2018 1101   BILITOT <0.2 (L) 10/26/2018 1101  GFRNONAA >60 11/09/2018 1447   GFRNONAA >60 10/26/2018 1101   GFRAA >60 11/09/2018 1447   GFRAA >60 10/26/2018 1101         RADIOGRAPHY: Dg Chest 2 View  Result Date: 11/10/2018 CLINICAL DATA:  Pre-op respiratory exam for lumbar spine surgery. Metastatic breast carcinoma. EXAM: CHEST - 2 VIEW COMPARISON:  09/26/2017 FINDINGS: The heart size and mediastinal contours are within normal limits. Both lungs are clear. The visualized skeletal structures are unremarkable. IMPRESSION: No active cardiopulmonary disease. Electronically Signed   By: Marlaine Hind M.D.   On: 11/10/2018 08:08   Dg Lumbar Spine 2-3 Views  Result Date: 11/12/2018 CLINICAL DATA:  LUMBAR TWO BIOPSY AND LUMBAR TWO KYPHOPLASTY Dr. Adonis [facility] OR room 4 Radiation safety timeout performed by Norva Riffle 3 minutes 7 seconds fluoro time 2 images and 2 dose summaries saved to PACS EXAM: LUMBAR SPINE - 2-3 VIEW; DG C-ARM 61-120 MIN COMPARISON:  05/06/2018 FINDINGS: Two images are submitted, showing lytic lesion at L2 and subsequent vertebral augmentation at this level,  by history. IMPRESSION: L2 vertebral augmentation. Electronically Signed   By: Nolon Nations M.D.   On: 11/12/2018 14:44   Dg C-arm 1-60 Min  Result Date: 11/12/2018 CLINICAL DATA:  LUMBAR TWO BIOPSY AND LUMBAR TWO KYPHOPLASTY Dr. Adonis [facility] OR room 4 Radiation safety timeout performed by Norva Riffle 3 minutes 7 seconds fluoro time 2 images and 2 dose summaries saved to PACS EXAM: LUMBAR SPINE - 2-3 VIEW; DG C-ARM 61-120 MIN COMPARISON:  05/06/2018 FINDINGS: Two images are submitted, showing lytic lesion at L2 and subsequent vertebral augmentation at this level, by history. IMPRESSION: L2 vertebral augmentation. Electronically Signed   By: Nolon Nations M.D.   On: 11/12/2018 14:44   Ir Radiologist Eval & Mgmt  Result Date: 11/03/2018 Please refer to notes tab for details about interventional procedure. (Op Note)     IMPRESSION/PLAN: Bone metastases  Today, I talked to the patient about the findings and work-up thus far. We discussed the patient's diagnosis of metastatic breast cancer to bones and general treatment for this, highlighting the role of radiotherapy in the management. We discussed the available radiation techniques, and focused on the details of logistics and delivery.    We discussed the pros and cons of treating her right iliac bone.  Since this is asymptomatic we will hold off on treating that right now.  However I do recommend that we treat the left rib lesions and the lumbar lesion at L2 that underwent kyphoplasty.  She is enthusiastic about this.  We discussed a 2-week regimen of radiotherapy to these sites.  Certainly we can treat the right iliac bone if it becomes symptomatic or progressive in a worrisome way.  We discussed the risks, benefits, and side effects of radiotherapy. Side effects may include but not necessarily be limited to: Skin irritation, fatigue, queasiness or nausea, rare injury to the internal organs of the chest or abdomen; no guarantees of  treatment were given. A consent form was signed and placed in the patient's medical record. The patient was encouraged to ask questions that I answered to the best of my ability. We will proceed with simulation today   I spent 20 minutes  face to face with the patient and more than 50% of that time was spent in counseling and/or coordination of care.    __________________________________________   [name], MD

## 2018-12-01 DIAGNOSIS — C7951 Secondary malignant neoplasm of bone: Secondary | ICD-10-CM | POA: Diagnosis not present

## 2018-12-02 ENCOUNTER — Other Ambulatory Visit: Payer: Self-pay

## 2018-12-02 ENCOUNTER — Other Ambulatory Visit: Payer: Self-pay | Admitting: Radiology

## 2018-12-02 ENCOUNTER — Telehealth: Payer: Self-pay

## 2018-12-02 ENCOUNTER — Telehealth: Payer: Self-pay | Admitting: Hematology

## 2018-12-02 ENCOUNTER — Other Ambulatory Visit: Payer: Self-pay | Admitting: Hematology

## 2018-12-02 ENCOUNTER — Ambulatory Visit
Admission: RE | Admit: 2018-12-02 | Discharge: 2018-12-02 | Disposition: A | Discharge status: Home / Self Care | Payer: BC Managed Care – PPO | Source: Ambulatory Visit | Attending: Radiation Oncology | Admitting: Radiation Oncology

## 2018-12-02 DIAGNOSIS — R110 Nausea: Secondary | ICD-10-CM

## 2018-12-02 DIAGNOSIS — C7951 Secondary malignant neoplasm of bone: Secondary | ICD-10-CM | POA: Diagnosis not present

## 2018-12-02 MED ORDER — ONDANSETRON HCL 8 MG PO TABS: 8.0000 mg | ORAL_TABLET | Freq: Three times a day (TID) | ORAL | 0 refills | Status: DC | PRN

## 2018-12-02 MED ORDER — MORPHINE SULFATE ER 30 MG PO TBCR: 30.0000 mg | EXTENDED_RELEASE_TABLET | Freq: Two times a day (BID) | ORAL | 0 refills | Status: DC

## 2018-12-02 MED ORDER — OXYCODONE HCL 5 MG PO TABS: 5.0000 mg | ORAL_TABLET | Freq: Four times a day (QID) | ORAL | 0 refills | Status: DC | PRN

## 2018-12-02 NOTE — Telephone Encounter (Signed)
Patient calls stating she is scheduled for biopsy tomorrow on right side (iliac crest) she saw Dr. Rolena Infante and he said that she could possibly have a fracture because she is having a lot of pain on that side.  She wants to know what if anything she should do or have done?  Also she needs refills on Zofran, MS Contin and Oxycodone sent into the pharmacy on file.     _______________________________ Message given to Dr. Burr Medico for review

## 2018-12-02 NOTE — Telephone Encounter (Signed)
Spoke with Candy at [facility] left a message for Dr. Melina Schools to call Dr. Burr Medico on her cell phone today to discuss the patient.

## 2018-12-02 NOTE — Telephone Encounter (Signed)
I called pt back, may consider radiation to right iliac bone lesion since she is having more pain. Will start her on systemic therapy as soon as her second bone biopsy result came back. I also spoke with Dr. Rolena Infante to clarify he has no concerns about pending fracture etc.   Truitt Merle  12/02/2018

## 2018-12-02 NOTE — Telephone Encounter (Signed)
Thanks much, Judson Roch!  Truitt Merle MD

## 2018-12-03 ENCOUNTER — Ambulatory Visit: Payer: BC Managed Care – PPO

## 2018-12-03 ENCOUNTER — Ambulatory Visit (HOSPITAL_COMMUNITY)
Admission: RE | Admit: 2018-12-03 | Discharge: 2018-12-03 | Disposition: A | Discharge status: Home / Self Care | Payer: Medicaid Other | Source: Ambulatory Visit | Attending: Hematology | Admitting: Hematology

## 2018-12-03 ENCOUNTER — Other Ambulatory Visit: Payer: Self-pay

## 2018-12-03 DIAGNOSIS — Z8249 Family history of ischemic heart disease and other diseases of the circulatory system: Secondary | ICD-10-CM | POA: Diagnosis not present

## 2018-12-03 DIAGNOSIS — Z923 Personal history of irradiation: Secondary | ICD-10-CM | POA: Diagnosis not present

## 2018-12-03 DIAGNOSIS — C7951 Secondary malignant neoplasm of bone: Secondary | ICD-10-CM | POA: Diagnosis present

## 2018-12-03 DIAGNOSIS — Z9221 Personal history of antineoplastic chemotherapy: Secondary | ICD-10-CM | POA: Diagnosis not present

## 2018-12-03 DIAGNOSIS — Z9049 Acquired absence of other specified parts of digestive tract: Secondary | ICD-10-CM | POA: Insufficient documentation

## 2018-12-03 DIAGNOSIS — Z833 Family history of diabetes mellitus: Secondary | ICD-10-CM | POA: Insufficient documentation

## 2018-12-03 DIAGNOSIS — E119 Type 2 diabetes mellitus without complications: Secondary | ICD-10-CM | POA: Diagnosis not present

## 2018-12-03 DIAGNOSIS — Z79899 Other long term (current) drug therapy: Secondary | ICD-10-CM | POA: Diagnosis not present

## 2018-12-03 DIAGNOSIS — Z7984 Long term (current) use of oral hypoglycemic drugs: Secondary | ICD-10-CM | POA: Diagnosis not present

## 2018-12-03 DIAGNOSIS — Z9011 Acquired absence of right breast and nipple: Secondary | ICD-10-CM | POA: Insufficient documentation

## 2018-12-03 DIAGNOSIS — Z853 Personal history of malignant neoplasm of breast: Secondary | ICD-10-CM | POA: Diagnosis not present

## 2018-12-03 LAB — CBC WITH DIFFERENTIAL/PLATELET
Abs Immature Granulocytes: 0.03 10*3/uL (ref 0.00–0.07)
Basophils Absolute: 0 10*3/uL (ref 0.0–0.1)
Basophils Relative: 0 %
Eosinophils Absolute: 0.1 10*3/uL (ref 0.0–0.5)
Eosinophils Relative: 1 %
HCT: 40.7 % (ref 36.0–46.0)
Hemoglobin: 13 g/dL (ref 12.0–15.0)
Immature Granulocytes: 0 %
Lymphocytes Relative: 32 %
Lymphs Abs: 3 10*3/uL (ref 0.7–4.0)
MCH: 27.7 pg (ref 26.0–34.0)
MCHC: 31.9 g/dL (ref 30.0–36.0)
MCV: 86.6 fL (ref 80.0–100.0)
Monocytes Absolute: 0.4 10*3/uL (ref 0.1–1.0)
Monocytes Relative: 4 %
Neutro Abs: 5.9 10*3/uL (ref 1.7–7.7)
Neutrophils Relative %: 63 %
Platelets: 293 10*3/uL (ref 150–400)
RBC: 4.7 MIL/uL (ref 3.87–5.11)
RDW: 14 % (ref 11.5–15.5)
WBC: 9.5 10*3/uL (ref 4.0–10.5)
nRBC: 0 % (ref 0.0–0.2)

## 2018-12-03 LAB — GLUCOSE, CAPILLARY: Glucose-Capillary: 107 mg/dL — ABNORMAL HIGH (ref 70–99)

## 2018-12-03 LAB — PROTIME-INR
INR: 1 (ref 0.8–1.2)
Prothrombin Time: 13.3 seconds (ref 11.4–15.2)

## 2018-12-03 LAB — BASIC METABOLIC PANEL
Anion gap: 12 (ref 5–15)
BUN: 18 mg/dL (ref 6–20)
CO2: 25 mmol/L (ref 22–32)
Calcium: 9.1 mg/dL (ref 8.9–10.3)
Chloride: 103 mmol/L (ref 98–111)
Creatinine, Ser: 0.96 mg/dL (ref 0.44–1.00)
GFR calc Af Amer: >60 mL/min (ref >60)
GFR calc non Af Amer: >60 mL/min (ref >60)
Glucose, Bld: 112 mg/dL — ABNORMAL HIGH (ref 70–99)
Potassium: 3 mmol/L — ABNORMAL LOW (ref 3.5–5.1)
Sodium: 140 mmol/L (ref 135–145)

## 2018-12-03 MED ORDER — SODIUM CHLORIDE 0.9 % IV SOLN
INTRAVENOUS | Status: DC
  Administered 2018-12-03: 08:00:00 via INTRAVENOUS

## 2018-12-03 MED ORDER — FENTANYL CITRATE (PF) 100 MCG/2ML IJ SOLN
INTRAMUSCULAR | Status: AC | PRN
  Administered 2018-12-03 (×2): 50 ug via INTRAVENOUS

## 2018-12-03 MED ORDER — FENTANYL CITRATE (PF) 100 MCG/2ML IJ SOLN
INTRAMUSCULAR | Status: AC
  Filled 2018-12-03: qty 2

## 2018-12-03 MED ORDER — MIDAZOLAM HCL 2 MG/2ML IJ SOLN
INTRAMUSCULAR | Status: AC
  Filled 2018-12-03: qty 4

## 2018-12-03 MED ORDER — MIDAZOLAM HCL 2 MG/2ML IJ SOLN
INTRAMUSCULAR | Status: AC | PRN
  Administered 2018-12-03 (×2): 1 mg via INTRAVENOUS

## 2018-12-03 MED ORDER — LIDOCAINE HCL (PF) 1 % IJ SOLN
INTRAMUSCULAR | Status: AC | PRN
  Administered 2018-12-03: 10 mL

## 2018-12-03 NOTE — Discharge Instructions (Signed)
Moderate Conscious Sedation, Adult, Care After These instructions provide you with information about caring for yourself after your procedure. Your health care provider may also give you more specific instructions. Your treatment has been planned according to current medical practices, but problems sometimes occur. Call your health care provider if you have any problems or questions after your procedure. What can I expect after the procedure? After your procedure, it is common:  To feel sleepy for several hours.  To feel clumsy and have poor balance for several hours.  To have poor judgment for several hours.  To vomit if you eat too soon. Follow these instructions at home: For at least 24 hours after the procedure:   Do not: ? Participate in activities where you could fall or become injured. ? Drive. ? Use heavy machinery. ? Drink alcohol. ? Take sleeping pills or medicines that cause drowsiness. ? Make important decisions or sign legal documents. ? Take care of children on your own.  Rest. Eating and drinking  Follow the diet recommended by your health care provider.  If you vomit: ? Drink water, juice, or soup when you can drink without vomiting. ? Make sure you have little or no nausea before eating solid foods. General instructions  Have a responsible adult stay with you until you are awake and alert.  Take over-the-counter and prescription medicines only as told by your health care provider.  If you smoke, do not smoke without supervision.  Keep all follow-up visits as told by your health care provider. This is important. Contact a health care provider if:  You keep feeling nauseous or you keep vomiting.  You feel light-headed.  You develop a rash.  You have a fever. Get help right away if:  You have trouble breathing. This information is not intended to replace advice given to you by your health care provider. Make sure you discuss any questions you have  with your health care provider. Document Released: 01/20/2013 Document Revised: 03/14/2017 Document Reviewed: 07/22/2015 Elsevier Patient Education  2020 [place].   Needle Biopsy of the Bone, Care After This sheet gives you information about how to care for yourself after your procedure. Your health care provider may also give you more specific instructions. If you have problems or questions, contact your health care provider. What can I expect after the procedure? After the procedure, it is common to have soreness or tenderness at the puncture site. Follow these instructions at home: Puncture care   Follow instructions from your health care provider about how to take care of your puncture site. Make sure you: ? Wash your hands with soap and water before and after you change your bandage (dressing). If soap and water are not available, use hand sanitizer. ? Change your dressing as told by your health care provider.  You may remove your dressing and shower tomorrow.  Check your puncture site every day for signs of infection. Check for: ? Redness, swelling, or worsening pain. ? Fluid or blood. ? Warmth. ? Pus or a bad smell. General instructions  Take over-the-counter and prescription medicines only as told by your health care provider.  Do not drive for 24 hours if you were given a sedative during your procedure.  Return to your normal activities as told by your health care provider.  Keep all follow-up visits as told by your health care provider. This is important. Contact a health care provider if:  You have redness, swelling, or worsening pain at the site  of your puncture.  You have fluid or blood coming from your puncture site.  Your puncture site feels warm to the touch.  You have pus or a bad smell coming from your puncture site.  You have a fever.  You have persistent nausea or vomiting. Get help right away if:  You develop a rash.  You have difficulty  breathing. Summary  After the procedure, it is common to have soreness or tenderness at the puncture site.  Follow instructions from your health care provider about how to take care of your puncture site.  Contact a health care provider if you have any signs of infection.  Keep all follow-up visits as told by your health care provider. This is important. This information is not intended to replace advice given to you by your health care provider. Make sure you discuss any questions you have with your health care provider. Document Released: 10/19/2004 Document Revised: 12/11/2017 Document Reviewed: 12/11/2017 Elsevier Patient Education  2020 [employer].

## 2018-12-03 NOTE — Procedures (Signed)
Interventional Radiology Procedure Note  Procedure: CT guided biopsy of right iliac metastatic lesion, with predominate soft tissue component. This will serve as source for 3rd party molecular testing.   Complications: None  Recommendations:  - Ok to shower tomorrow - Do not submerge for 7 days - Routine care   Signed,  Dulcy Fanny. Earleen Newport, DO

## 2018-12-03 NOTE — H&P (Signed)
Chief Complaint: Patient was seen in consultation today for right iliac bone lesion/biopsy.  Referring Physician(s): Feng,Yan  Supervising Physician: Corrie Mckusick  Patient Status: [facility] - Out-pt  History of Present Illness: Caitlin Cohen is a 53 y.o. female with a past medical history of headaches, GERD, breast cancer, diabetes mellitus, anemia, depression, and anxiety. She was unfortunately diagnosed with breast cancer in 06/2017. Her cancer is managed by Dr. Burr Medico. She has completed radiation therapy (11-10-2017 to 12/08/2017) and is currently undergoing chemotherapy (began 08/08/2017) and immunotherapy (began 12/2017) for management of her breast cancer. Her most recent follow-up imaging revealed multiple lytic bone lesions (including a right iliac bone lesion) concerning for metastasis.  CT abdomen/pelvis 10/27/2018: 1. Multifocal lytic bone metastases are identified involving the left ribs, L2 vertebra, and right iliac wing. There is of pathologic fracture involving the L2 vertebra. 2. Single small, nonspecific pulmonary nodule in the right upper lobe is noted. 3. Ill-defined low-density structure in right lobe of liver measures 1.3 cm and is indeterminate. This may be better characterized with contrast enhanced liver MRI or PET-CT.  NM bone scan 10/28/2018: 1. Sites of uptake in the left third and sixth ribs, right iliac crest and L2 vertebral body are compatible with sites of osseous metastatic disease with lesions visualized on CT from 1 day prior. 2. Non-specific soft tissue uptake is seen at the site of prior right lumpectomy. 3. Likely urinary contamination in the soft tissues of the right pelvis, buttock and right flank.  IR requested by Dr. Burr Medico for possible image-guided right iliac lesion biopsy. Patient awake and alert laying in bed watching TV with no complaints at this time. Denies fever, chills, chest pain, dyspnea, abdominal pain, or headache.   Past Medical History:   Diagnosis Date  . Anemia   . Anxiety   . Cancer ([place]) dx'd 06/2017   breast cancer right  . Depression   . Diabetes mellitus without complication ([place])   . GERD (gastroesophageal reflux disease)   . Headache   . History of radiation therapy 11/10/17- 12/08/17   right breast, 40.05 Gy in 15 fractions. Right breast boost 10 Gy in 5 fractions.   . Personal history of chemotherapy   . Personal history of radiation therapy     Past Surgical History:  Procedure Laterality Date  . CERVICAL CONE BIOPSY    . CHOLECYSTECTOMY    . IR RADIOLOGIST EVAL & MGMT  11/03/2018  . KYPHOPLASTY N/A 11/12/2018   Procedure: LUMBAR TWO  BIOPSY AND LUMBAR TWO KYPHOPLASTY;  Surgeon: Melina Schools, MD;  Location: [place];  Service: Orthopedics;  Laterality: N/A;  90 mins  . MASTECTOMY WITH RADIOACTIVE SEED GUIDED EXCISION AND AXILLARY SENTINEL LYMPH NODE BIOPSY Right 07/08/2017   Procedure: RIGHT LUMPTECTOMY WITH RADIOACTIVE SEED GUIDED EXCISION AND RIGHT AXILLARY SENTINEL LYMPH NODE BIOPSY ERAS PATHWAY;  Surgeon: [name], MD;  Location: [place];  Service: General;  Laterality: Right;  PEC BLOCK  . OPEN REDUCTION INTERNAL FIXATION (ORIF) DISTAL RADIAL FRACTURE  03/17/2012   Procedure: OPEN REDUCTION INTERNAL FIXATION (ORIF) DISTAL RADIAL FRACTURE;  Surgeon: Roseanne Kaufman, MD;  Location: [place];  Service: Orthopedics;  Laterality: Right;  Pre-operative a supra-clavical block right arm in addition to general anesthesia  . PORT-A-CATH REMOVAL Right 02/02/2018   Procedure: REMOVAL PORT-A-CATH;  Surgeon: [name], MD;  Location: [place];  Service: General;  Laterality: Right;  . PORTACATH PLACEMENT N/A 07/30/2017   Procedure: INSERTION PORT-A-CATH WITH [place];  Surgeon: Donne Hazel,  Rodman Key, MD;  Location: [place];  Service: General;  Laterality: N/A;  . TUBAL LIGATION      Allergies: Patient has no known allergies.  Medications: Prior to Admission medications   Medication Sig Start Date  End Date Taking? Authorizing Provider  gabapentin (NEURONTIN) 100 MG capsule Take 1 capsule in the morning and 3 capsules at bedtime Patient taking differently: Take 300 mg by mouth 3 (three) times daily.  08/11/18  Yes [name], NP  metFORMIN (GLUCOPHAGE) 500 MG tablet Take 1 tablet (500 mg total) by mouth 2 (two) times daily with a meal. 06/19/18  Yes Truitt Merle, MD  morphine (MS CONTIN) 30 MG 12 hr tablet Take 1 tablet (30 mg total) by mouth every 12 (twelve) hours. 12/02/18  Yes Truitt Merle, MD  ondansetron (ZOFRAN) 8 MG tablet Take 1 tablet (8 mg total) by mouth every 8 (eight) hours as needed for nausea or vomiting. 12/02/18  Yes Truitt Merle, MD  oxyCODONE (OXY IR/ROXICODONE) 5 MG immediate release tablet Take 1-2 tablets (5-10 mg total) by mouth every 6 (six) hours as needed for severe pain. 12/02/18  Yes Truitt Merle, MD  polyethylene glycol (MIRALAX) 17 g packet Take 17 g by mouth daily as needed. 10/06/18  Yes Robyn Haber, MD  tamoxifen (NOLVADEX) 20 MG tablet Take 1 tablet (20 mg total) by mouth daily. Patient not taking: Reported on 11/05/2018 06/19/18   Truitt Merle, MD     Family History  Problem Relation Age of Onset  . Hypertension Mother   . Diabetes Father   . Hypertension Father     Social History   Socioeconomic History  . Marital status: Single    Spouse name: Not on file  . Number of children: Not on file  . Years of education: Not on file  . Highest education level: Not on file  Occupational History  . Not on file  Social Needs  . Financial resource strain: Not on file  . Food insecurity    Worry: Not on file    Inability: Not on file  . Transportation needs    Medical: No    Non-medical: No  Tobacco Use  . Smoking status: Never Smoker  . Smokeless tobacco: Never Used  Substance and Sexual Activity  . Alcohol use: Yes    Comment: occassionally  . Drug use: No  . Sexual activity: Yes    Birth control/protection: Surgical  Lifestyle  . Physical activity     Days per week: 2 days    Minutes per session: 30 min  . Stress: Only a little  Relationships  . Social connections    Talks on phone: More than three times a week    Gets together: Once a week    Attends religious service: Never    Active member of club or organization: No    Attends meetings of clubs or organizations: Never    Relationship status: Never married  Other Topics Concern  . Not on file  Social History Narrative   Lives with roommate   Works fulltime but seasonal work     Review of Systems: A 12 point ROS discussed and pertinent positives are indicated in the HPI above.  All other systems are negative.  Review of Systems  Constitutional: Negative for chills and fever.  Respiratory: Negative for shortness of breath and wheezing.   Cardiovascular: Negative for chest pain and palpitations.  Gastrointestinal: Negative for abdominal pain.  Neurological: Negative for headaches.  Psychiatric/Behavioral: Negative  for behavioral problems and confusion.    Vital Signs: BP (!) 144/65   Pulse (!) 111   Temp (!) 97.4 F (36.3 C) (Oral)   Resp 18   SpO2 97%   Physical Exam Vitals signs and nursing note reviewed.  Constitutional:      General: She is not in acute distress.    Appearance: Normal appearance.  Cardiovascular:     Rate and Rhythm: Normal rate and regular rhythm.     Heart sounds: Normal heart sounds. No murmur.  Pulmonary:     Effort: Pulmonary effort is normal. No respiratory distress.     Breath sounds: Normal breath sounds. No wheezing.  Skin:    General: Skin is warm and dry.  Neurological:     Mental Status: She is alert and oriented to person, place, and time.  Psychiatric:        Mood and Affect: Mood normal.        Behavior: Behavior normal.        Thought Content: Thought content normal.        Judgment: Judgment normal.      MD Evaluation Airway: WNL Heart: WNL Abdomen: WNL Chest/ Lungs: WNL ASA  Classification: 3  Mallampati/Airway Score: Two   Imaging: Dg Chest 2 View  Result Date: 11/10/2018 CLINICAL DATA:  Pre-op respiratory exam for lumbar spine surgery. Metastatic breast carcinoma. EXAM: CHEST - 2 VIEW COMPARISON:  09/26/2017 FINDINGS: The heart size and mediastinal contours are within normal limits. Both lungs are clear. The visualized skeletal structures are unremarkable. IMPRESSION: No active cardiopulmonary disease. Electronically Signed   By: Marlaine Hind M.D.   On: 11/10/2018 08:08   Dg Lumbar Spine 2-3 Views  Result Date: 11/12/2018 CLINICAL DATA:  LUMBAR TWO BIOPSY AND LUMBAR TWO KYPHOPLASTY Dr. Adonis [facility] OR room 4 Radiation safety timeout performed by Norva Riffle 3 minutes 7 seconds fluoro time 2 images and 2 dose summaries saved to PACS EXAM: LUMBAR SPINE - 2-3 VIEW; DG C-ARM 61-120 MIN COMPARISON:  05/06/2018 FINDINGS: Two images are submitted, showing lytic lesion at L2 and subsequent vertebral augmentation at this level, by history. IMPRESSION: L2 vertebral augmentation. Electronically Signed   By: Nolon Nations M.D.   On: 11/12/2018 14:44   Dg C-arm 1-60 Min  Result Date: 11/12/2018 CLINICAL DATA:  LUMBAR TWO BIOPSY AND LUMBAR TWO KYPHOPLASTY Dr. Adonis Housekeeper [facility] OR room 4 Radiation safety timeout performed by Norva Riffle 3 minutes 7 seconds fluoro time 2 images and 2 dose summaries saved to PACS EXAM: LUMBAR SPINE - 2-3 VIEW; DG C-ARM 61-120 MIN COMPARISON:  05/06/2018 FINDINGS: Two images are submitted, showing lytic lesion at L2 and subsequent vertebral augmentation at this level, by history. IMPRESSION: L2 vertebral augmentation. Electronically Signed   By: Nolon Nations M.D.   On: 11/12/2018 14:44   Ir Radiologist Eval & Mgmt  Result Date: 11/03/2018 Please refer to notes tab for details about interventional procedure. (Op Note)   Labs:  CBC: Recent Labs    06/19/18 0952 10/26/18 1101 11/09/18 1447 12/03/18 0751  WBC 6.4 7.8 9.4 9.5  HGB 11.7* 13.3 12.7  13.0  HCT 36.8 41.2 39.3 40.7  PLT 240 253 268 293    COAGS: Recent Labs    11/09/18 1447 12/03/18 0751  INR 1.1 1.0  APTT 29  --     BMP: Recent Labs    06/19/18 0952 10/26/18 1101 11/09/18 1447 12/03/18 0751  NA 141 141 140 140  K 3.5  4.0 3.4* 3.0*  CL 108 106 105 103  CO2 26 26 24 25   GLUCOSE 92 109* 107* 112*  BUN 16 13 10 18   CALCIUM 8.9 9.1 9.2 9.1  CREATININE 0.92 0.83 0.89 0.96  GFRNONAA >60 >60 >60 >60  GFRAA >60 >60 >60 >60    LIVER FUNCTION TESTS: Recent Labs    12/05/17 1401 01/16/18 1016 06/19/18 0952 10/26/18 1101  BILITOT 0.4 0.3 0.6 <0.2*  AST 19 15 16 15   ALT 18 11 12 13   ALKPHOS 69 63 57 82  PROT 7.2 7.4 7.2 7.6  ALBUMIN 3.5 3.5 3.8 3.6     Assessment and Plan:  Right iliac bone lesion. Plan for image-guided right iliac lesions biopsy today with Dr. Earleen Newport. Patient is NPO. Afebrile and WBCs WNL. She does not take blood thinners. INR 1.0 today.  Risks and benefits discussed with the patient including, but not limited to bleeding, infection, damage to adjacent structures or low yield requiring additional tests. All of the patient's questions were answered, patient is agreeable to proceed. Consent signed and in chart.   Thank you for this interesting consult.  I greatly enjoyed meeting Aisling Emigh and look forward to participating in their care.  A copy of this report was sent to the requesting provider on this date.  Electronically Signed: Earley Abide, PA-C 12/03/2018, 8:32 AM   I spent a total of 40 Minutes in face to face in clinical consultation, greater than 50% of which was counseling/coordinating care for right iliac bone lesion/biopsy.

## 2018-12-04 ENCOUNTER — Other Ambulatory Visit: Payer: Self-pay

## 2018-12-04 ENCOUNTER — Ambulatory Visit
Admission: RE | Admit: 2018-12-04 | Discharge: 2018-12-04 | Disposition: A | Discharge status: Home / Self Care | Payer: BC Managed Care – PPO | Source: Ambulatory Visit | Attending: Radiation Oncology | Admitting: Radiation Oncology

## 2018-12-04 DIAGNOSIS — C7951 Secondary malignant neoplasm of bone: Secondary | ICD-10-CM | POA: Diagnosis not present

## 2018-12-07 ENCOUNTER — Ambulatory Visit
Admission: RE | Admit: 2018-12-07 | Discharge: 2018-12-07 | Disposition: A | Discharge status: Home / Self Care | Payer: BC Managed Care – PPO | Source: Ambulatory Visit | Attending: Radiation Oncology | Admitting: Radiation Oncology

## 2018-12-07 ENCOUNTER — Other Ambulatory Visit: Payer: Self-pay

## 2018-12-07 DIAGNOSIS — C7951 Secondary malignant neoplasm of bone: Secondary | ICD-10-CM

## 2018-12-07 NOTE — Progress Notes (Signed)
  Radiation Oncology         [phone] ________________________________  Name: Caitlin Cohen MRN: [id number]  Date: 12/07/2018  DOB: 07/18/1965  SIMULATION AND TREATMENT PLANNING NOTE  Outpatient  DIAGNOSIS:     ICD-10-CM   1. Bone metastases ([place])  C79.51     NARRATIVE:  The patient was brought to the [facility].  Identity was confirmed.  All relevant records and images related to the planned course of therapy were reviewed.  The patient freely provided informed written consent to proceed with treatment after reviewing the details related to the planned course of therapy. The consent form was witnessed and verified by the simulation staff.    Then, the patient was set-up in a stable reproducible  supine position for radiation therapy.  CT images were obtained.  Surface markings were placed.  The CT images were loaded into the planning software.    TREATMENT PLANNING NOTE: Treatment planning then occurred.  The radiation prescription was entered and confirmed.    A total of 3 medically necessary complex treatment devices were fabricated and supervised by me, in the form of 2 fields with MLCs to block bladder and bowel, and a vaclock. MORE FIELDS WITH MLCs MAY BE ADDED IN DOSIMETRY for dose homogeneity.  I have requested : 3D Simulation  I have requested a DVH of the following structures: target, bowel, bladder.     The patient will receive 20 Gy in 5 fractions to the right iliac lesion.  -----------------------------------  [name], MD

## 2018-12-08 ENCOUNTER — Ambulatory Visit: Payer: BC Managed Care – PPO

## 2018-12-09 ENCOUNTER — Other Ambulatory Visit: Payer: Self-pay

## 2018-12-09 ENCOUNTER — Encounter: Payer: Self-pay | Admitting: Hematology

## 2018-12-09 ENCOUNTER — Ambulatory Visit
Admission: RE | Admit: 2018-12-09 | Discharge: 2018-12-09 | Disposition: A | Discharge status: Home / Self Care | Payer: BC Managed Care – PPO | Source: Ambulatory Visit | Attending: Radiation Oncology | Admitting: Radiation Oncology

## 2018-12-09 ENCOUNTER — Inpatient Hospital Stay: Payer: BC Managed Care – PPO

## 2018-12-09 ENCOUNTER — Inpatient Hospital Stay (HOSPITAL_BASED_OUTPATIENT_CLINIC_OR_DEPARTMENT_OTHER): Payer: BC Managed Care – PPO | Admitting: Hematology

## 2018-12-09 VITALS — BP 119/87 | HR 92 | Temp 98.2°F | Resp 19 | Ht 67.0 in | Wt 293.3 lb

## 2018-12-09 DIAGNOSIS — C7951 Secondary malignant neoplasm of bone: Secondary | ICD-10-CM

## 2018-12-09 DIAGNOSIS — Z923 Personal history of irradiation: Secondary | ICD-10-CM | POA: Diagnosis not present

## 2018-12-09 DIAGNOSIS — C50211 Malignant neoplasm of upper-inner quadrant of right female breast: Secondary | ICD-10-CM

## 2018-12-09 DIAGNOSIS — Z9221 Personal history of antineoplastic chemotherapy: Secondary | ICD-10-CM | POA: Diagnosis not present

## 2018-12-09 DIAGNOSIS — Z7984 Long term (current) use of oral hypoglycemic drugs: Secondary | ICD-10-CM | POA: Diagnosis not present

## 2018-12-09 DIAGNOSIS — Z79899 Other long term (current) drug therapy: Secondary | ICD-10-CM | POA: Diagnosis not present

## 2018-12-09 DIAGNOSIS — F419 Anxiety disorder, unspecified: Secondary | ICD-10-CM | POA: Diagnosis not present

## 2018-12-09 DIAGNOSIS — Z170 Estrogen receptor positive status [ER+]: Secondary | ICD-10-CM

## 2018-12-09 DIAGNOSIS — E119 Type 2 diabetes mellitus without complications: Secondary | ICD-10-CM | POA: Diagnosis not present

## 2018-12-09 DIAGNOSIS — Z79810 Long term (current) use of selective estrogen receptor modulators (SERMs): Secondary | ICD-10-CM | POA: Diagnosis not present

## 2018-12-09 LAB — CMP (CANCER CENTER ONLY)
ALT: 10 U/L (ref 0–44)
AST: 17 U/L (ref 15–41)
Albumin: 3.5 g/dL (ref 3.5–5.0)
Alkaline Phosphatase: 79 U/L (ref 38–126)
Anion gap: 9 (ref 5–15)
BUN: 13 mg/dL (ref 6–20)
CO2: 28 mmol/L (ref 22–32)
Calcium: 9.2 mg/dL (ref 8.9–10.3)
Chloride: 104 mmol/L (ref 98–111)
Creatinine: 0.83 mg/dL (ref 0.44–1.00)
GFR, Est AFR Am: >60 mL/min (ref >60)
GFR, Estimated: >60 mL/min (ref >60)
Glucose, Bld: 94 mg/dL (ref 70–99)
Potassium: 3.4 mmol/L — ABNORMAL LOW (ref 3.5–5.1)
Sodium: 141 mmol/L (ref 135–145)
Total Bilirubin: 0.3 mg/dL (ref 0.3–1.2)
Total Protein: 7.2 g/dL (ref 6.5–8.1)

## 2018-12-09 LAB — CBC WITH DIFFERENTIAL (CANCER CENTER ONLY)
Abs Immature Granulocytes: 0.02 10*3/uL (ref 0.00–0.07)
Basophils Absolute: 0 10*3/uL (ref 0.0–0.1)
Basophils Relative: 0 %
Eosinophils Absolute: 0.1 10*3/uL (ref 0.0–0.5)
Eosinophils Relative: 1 %
HCT: 38.8 % (ref 36.0–46.0)
Hemoglobin: 12.8 g/dL (ref 12.0–15.0)
Immature Granulocytes: 0 %
Lymphocytes Relative: 28 %
Lymphs Abs: 1.8 10*3/uL (ref 0.7–4.0)
MCH: 28.3 pg (ref 26.0–34.0)
MCHC: 33 g/dL (ref 30.0–36.0)
MCV: 85.8 fL (ref 80.0–100.0)
Monocytes Absolute: 0.3 10*3/uL (ref 0.1–1.0)
Monocytes Relative: 4 %
Neutro Abs: 4.3 10*3/uL (ref 1.7–7.7)
Neutrophils Relative %: 67 %
Platelet Count: 253 10*3/uL (ref 150–400)
RBC: 4.52 MIL/uL (ref 3.87–5.11)
RDW: 13.9 % (ref 11.5–15.5)
WBC Count: 6.5 10*3/uL (ref 4.0–10.5)
nRBC: 0 % (ref 0.0–0.2)

## 2018-12-09 MED ORDER — PALBOCICLIB 125 MG PO TABS: 125.0000 mg | ORAL_TABLET | Freq: Every day | ORAL | 0 refills | Status: DC

## 2018-12-09 MED ORDER — GABAPENTIN 300 MG PO CAPS: 300.0000 mg | ORAL_CAPSULE | Freq: Three times a day (TID) | ORAL | 3 refills | Status: DC

## 2018-12-09 NOTE — Progress Notes (Signed)
Caitlin Cohen   [phone] [phone]   Clinic Follow up Note   Patient Care Team: Patient, No Pcp Per as PCP - General ([place]) Caitlin Merle, MD as Consulting Physician (Hematology) Caitlin Bookbinder, MD as Consulting Physician (General Surgery) Caitlin Gibson, MD as Attending Physician (Radiation Oncology) [name], NP as Nurse Practitioner (Nurse Practitioner)  Date of Service:  12/09/2018  CHIEF COMPLAINT: Follow upofright breastcancerand review her bone biopsy   SUMMARY OF ONCOLOGIC HISTORY: Oncology History Overview Note  Cancer Staging Malignant neoplasm of upper-inner quadrant of right breast in female, estrogen receptor positive ([place]) Staging form: Breast, AJCC 8th Edition - Clinical stage from 06/26/2017: Stage IIA (cT2, cN0, cM0, G3, ER: Positive, PR: Positive, HER2: Negative) - Signed by Caitlin Merle, MD on 07/01/2017 - Pathologic stage from 07/08/2017: Stage IB (pT2, pN0, cM0, G3, ER+, PR+, HER2-, Oncotype DX score: 29) - Signed by [name], NP on 07/25/2017     Malignant neoplasm of upper-inner quadrant of right breast in female, estrogen receptor positive ([place])  06/23/2017 Mammogram   IMPRESSION: 1. There is a highly suspicious mass measuring 3.5 cm mammographically in the right breast at the palpable site identified by the patient. 2. There is 1 suspicious lymph node with cortex measuring up to 6 mm. 3.  No mammographic evidence of malignancy in the left breast.   06/26/2017 Initial Biopsy   Diagnosis 06/26/17 1. Breast, right, needle core biopsy, upper inner quadrant, 12:30 o'clock, 7cm from nipple - INVASIVE DUCTAL CARCINOMA - SEE COMMENT 2. Lymph node, needle/core biopsy, right axillary (level 2 node) - NO CARCINOMA IDENTIFIED IN ONE LYMPH NODE (0/1)   06/26/2017 Receptors her2   Prognostic indicators significant for: ER, 70% positive and PR, 70% positive, both with strong staining intensity. Proliferation  marker Ki67 at 60%. HER2 negative.   07/01/2017 Initial Diagnosis   Malignant neoplasm of upper-inner quadrant of right breast in female, estrogen receptor positive ([place])   07/08/2017 Pathology Results   Diagnosis 1. Breast, lumpectomy, Right - INVASIVE DUCTAL CARCINOMA, NOTTINGHAM GRADE 3 OF 3, 3.5 CM - MARGINS UNINVOLVED BY CARCINOMA (0.1 CM, SUPERIOR MARGIN) - PREVIOUS BIOPSY SITE CHANGES - SEE ONCOLOGY TABLE BELOW 2. Soft tissue, biopsy, Axillary - BENIGN FIBROADIPOSE TISSUE - NO MALIGNANCY IDENTIFIED 3. Lymph node, sentinel, biopsy, Right axillary - NO CARCINOMA IDENTIFIED IN ONE LYMPH NODE (0/1) 4. Lymph node, sentinel, biopsy, Right axillary - NO CARCINOMA IDENTIFIED IN ONE LYMPH NODE (0/1) - PREVIOUS BIOPSY SITE CHANGES - SEE COMMENT 5. Breast, excision, Right superior margin - BENIGN BREAST TISSUE - NO RESIDUAL CARCINOMA IDENTIFIED 6. Breast, excision, Right inferior margin - BENIGN BREAST TISSUE - NO RESIDUAL CARCINOMA IDENTIFIED   07/08/2017 Cancer Staging   Staging form: Breast, AJCC 8th Edition - Pathologic stage from 07/08/2017: Stage IB (pT2, pN0, cM0, G3, ER+, PR+, HER2-, Oncotype DX score: 29) - Signed by [name], NP on 07/25/2017   07/22/2017 Oncotype testing   Recurrence Score: 29 Distant Recurrence Risk at 9 years with Ai or Tamoxifen alone: 18% Absolute Chemotherapy Benefit: > 15 %   08/08/2017 -  Chemotherapy   Adjuvant chemo Taxotere/Cytoxanq3 weeks x4 cycles with Udenyca -Granix added from cycle 2. Discontinued Udenyca following cycle 2.    11/10/2017 - 12/08/2017 Radiation Therapy   Radiation treatment dates:   11/10/2017 to 12/08/2017  Site/dose:    1. The Right breast was treated to 40.05 Gy in 15 fractions of 2.67 Gy. 2. The Right breast was  boosted to 10 Gy in 5 fractions of 2 Gy.  Beams/energy:    1. 3D// 10X 2. Isodose plan w/photons // 10X, 6X  Narrative: The patient tolerated radiation treatment relatively well.   She developed  moderate hyperpigmentation to the right breast with treatment. She denies any pain or fatigue. She continues to use radiaplex twice daily as directed.    12/2017 - 11/2018 Anti-estrogen oral therapy   started Tamoxifen 80m daily in 12/2017. Stopped in 11/2018 due to cancer recurrance.    08/11/2018 Survivorship   SCP virtual visit per LCira Rue NP    10/27/2018 Imaging   CT CAP W Contrast 10/27/18  IMPRESSION: 1. Multifocal lytic bone metastases are identified involving the left ribs, L2 vertebra, and right iliac wing. There is of pathologic fracture involving the L2 vertebra. 2. Single small, nonspecific pulmonary nodule in the right upper lobe is noted. 3. Ill-defined low-density structure in right lobe of liver measures 1.3 cm and is indeterminate. This may be better characterized with contrast enhanced liver MRI or PET-CT.   10/28/2018 Imaging   Whole Body Bone Scan 10/28/18  IMPRESSION: Sites of uptake in the left third and sixth ribs, right iliac crest and L2 vertebral body are compatible with sites of osseous metastatic disease with lesions visualized on CT from 1 day prior   Non-specific soft tissue uptake is seen at the site of prior right lumpectomy.   Likely urinary contamination in the soft tissues of the right pelvis, buttock and right flank.   11/12/2018 Relapse/Recurrence   Diagnosis 1. Bone, biopsy, Lumbar 2, Left Pedicle - BONE WITH MARROW FIBROSIS. - NO MALIGNANCY IDENTIFIED. 2. Bone, biopsy, Lumbar 2, Right Pedicle - METASTATIC CARCINOMA, SEE COMMENT.  The tumor cells are NEGATIVE for Her2 (0). Estrogen Receptor: 0%, NEGATIVE Progesterone Receptor: 0%, NEGATIVE   12/02/2018 -  Radiation Therapy   L2 palliative RT (12/02/18-12/09/18) and will start palliative RT to right iliac lesion 12/10/18 and plans to complete on 12/16/18 with Dr. SIsidore Moos   12/03/2018 Pathology Results   Diagnosis 12/03/18 Bone, biopsy, iliac crest, right - METASTATIC  CARCINOMA. Results: IMMUNOHISTOCHEMICAL AND MORPHOMETRIC ANALYSIS PERFORMED MANUALLY The tumor cells are NEGATIVE for Her2 (1+). Estrogen Receptor: 80%, POSITIVE, STRONG STAINING INTENSITY Progesterone Receptor: 20%, POSITIVE, STRONG STAINING INTENSITY      CURRENT THERAPY:  -L2 palliative RT (12/02/18-12/09/18) and will start palliative RT to right iliac lesion 12/10/18 and plans to complete on 12/16/18 with Dr. SIsidore Moos -Pending Zometa injection q391monthstarting in 1 week -Pending monthly Zoladex injections starting in 1 week  -Restart tamoxifen in 1 week. Plan to switch with Letrozole with 2nd Zoladex injection.  -Pending Ibrance 3 weeks on/1 week off starting week of 9/9  INTERVAL HISTORY:  KiTranae Laramies here for a follow up. She presents to the clinic alone. She notes her back pain is doing much better now 5/10 mainly in her right groin pain.  For her pain she takes MS Contin once a day. She takes oxycodone 1027mn the AM and in the PM. She tries to use ibuprofen to use less. She takes Gabapentin.  She continues to ambulate with cane. Her balance is improving. She still has trouble standing for extended periods of time. She is nauseous with occasional vomiting so she takes zofran every morning.   REVIEW OF SYSTEMS:   Constitutional: Denies fevers, chills or abnormal weight loss Eyes: Denies blurriness of vision Ears, nose, mouth, throat, and face: Denies mucositis or sore throat Respiratory: Denies cough,  dyspnea or wheezes Cardiovascular: Denies palpitation, chest discomfort or lower extremity swelling Gastrointestinal:  Denies nausea, heartburn or change in bowel habits MSK: (+) Back pain and right groin pain 5/10.  Skin: Denies abnormal skin rashes Lymphatics: Denies new lymphadenopathy or easy bruising Neurological:Denies numbness, tingling or new weaknesses Behavioral/Psych: Mood is stable, no new changes  All other systems were reviewed with the patient and are  negative.  MEDICAL HISTORY:  Past Medical History:  Diagnosis Date   Anemia    Anxiety    Cancer ([place]) dx'd 06/2017   breast cancer right   Depression    Diabetes mellitus without complication ([facility])    GERD (gastroesophageal reflux disease)    Headache    History of radiation therapy 11/10/17- 12/08/17   right breast, 40.05 Gy in 15 fractions. Right breast boost 10 Gy in 5 fractions.    Personal history of chemotherapy    Personal history of radiation therapy     SURGICAL HISTORY: Past Surgical History:  Procedure Laterality Date   CERVICAL CONE BIOPSY     CHOLECYSTECTOMY     IR RADIOLOGIST EVAL & MGMT  11/03/2018   KYPHOPLASTY N/A 11/12/2018   Procedure: LUMBAR TWO  BIOPSY AND LUMBAR TWO KYPHOPLASTY;  Surgeon: Melina Schools, MD;  Location: [place];  Service: Orthopedics;  Laterality: N/A;  90 mins   MASTECTOMY WITH RADIOACTIVE SEED GUIDED EXCISION AND AXILLARY SENTINEL LYMPH NODE BIOPSY Right 07/08/2017   Procedure: RIGHT LUMPTECTOMY WITH RADIOACTIVE SEED GUIDED EXCISION AND RIGHT AXILLARY SENTINEL LYMPH NODE BIOPSY ERAS PATHWAY;  Surgeon: Caitlin Bookbinder, MD;  Location: [facility];  Service: General;  Laterality: Right;  PEC BLOCK   OPEN REDUCTION INTERNAL FIXATION (ORIF) DISTAL RADIAL FRACTURE  03/17/2012   Procedure: OPEN REDUCTION INTERNAL FIXATION (ORIF) DISTAL RADIAL FRACTURE;  Surgeon: Roseanne Kaufman, MD;  Location: [facility];  Service: Orthopedics;  Laterality: Right;  Pre-operative a supra-clavical block right arm in addition to general anesthesia   PORT-A-CATH REMOVAL Right 02/02/2018   Procedure: REMOVAL PORT-A-CATH;  Surgeon: Caitlin Bookbinder, MD;  Location: [facility];  Service: General;  Laterality: Right;   PORTACATH PLACEMENT N/A 07/30/2017   Procedure: INSERTION PORT-A-CATH WITH [place];  Surgeon: Caitlin Bookbinder, MD;  Location: [place];  Service: General;  Laterality: N/A;   TUBAL LIGATION      I have reviewed the social history and family  history with the patient and they are unchanged from previous note.  ALLERGIES:  has No Known Allergies.  MEDICATIONS:  Current Outpatient Medications  Medication Sig Dispense Refill   metFORMIN (GLUCOPHAGE) 500 MG tablet Take 1 tablet (500 mg total) by mouth 2 (two) times daily with a meal. 180 tablet 1   morphine (MS CONTIN) 30 MG 12 hr tablet Take 1 tablet (30 mg total) by mouth every 12 (twelve) hours. 60 tablet 0   ondansetron (ZOFRAN) 8 MG tablet Take 1 tablet (8 mg total) by mouth every 8 (eight) hours as needed for nausea or vomiting. 20 tablet 0   oxyCODONE (OXY IR/ROXICODONE) 5 MG immediate release tablet Take 1-2 tablets (5-10 mg total) by mouth every 6 (six) hours as needed for severe pain. 90 tablet 0   polyethylene glycol (MIRALAX) 17 g packet Take 17 g by mouth daily as needed. 14 each 8   gabapentin (NEURONTIN) 100 MG capsule Take 1 capsule in the morning and 3 capsules at bedtime (Patient taking differently: Take 300 mg by mouth 3 (three) times daily. ) 360 capsule 1   gabapentin (  NEURONTIN) 300 MG capsule Take 1 capsule (300 mg total) by mouth 3 (three) times daily. 30 capsule 3   tamoxifen (NOLVADEX) 20 MG tablet Take 1 tablet (20 mg total) by mouth daily. (Patient not taking: Reported on 11/05/2018) 90 tablet 3   No current facility-administered medications for this visit.     PHYSICAL EXAMINATION: ECOG PERFORMANCE STATUS: 2 - Symptomatic, <50% confined to bed  Vitals:   12/09/18 1427  BP: 119/87  Pulse: 92  Resp: 19  Temp: 98.2 F (36.8 C)  SpO2: 99%   Filed Weights   12/09/18 1427  Weight: 293 lb 4.8 oz (133 kg)    GENERAL:alert, no distress and comfortable SKIN: skin color, texture, turgor are normal, no rashes or significant lesions EYES: normal, Conjunctiva are pink and non-injected, sclera clear  NECK: supple, thyroid normal size, non-tender, without nodularity LYMPH:  no palpable lymphadenopathy in the cervical, axillary  LUNGS: clear to  auscultation and percussion with normal breathing effort HEART: regular rate & rhythm and no murmurs and no lower extremity edema ABDOMEN:abdomen soft, non-tender and normal bowel sounds Musculoskeletal:no cyanosis of digits and no clubbing  NEURO: alert & oriented x 3 with fluent speech, no focal motor/sensory deficits  LABORATORY DATA:  I have reviewed the data as listed CBC Latest Ref Rng & Units 12/09/2018 12/03/2018 11/09/2018  WBC 4.0 - 10.5 K/uL 6.5 9.5 9.4  Hemoglobin 12.0 - 15.0 g/dL 12.8 13.0 12.7  Hematocrit 36.0 - 46.0 % 38.8 40.7 39.3  Platelets 150 - 400 K/uL 253 293 268     CMP Latest Ref Rng & Units 12/09/2018 12/03/2018 11/09/2018  Glucose 70 - 99 mg/dL 94 112(H) 107(H)  BUN 6 - 20 mg/dL _0 Creatinine 0.44 - 1.00 mg/dL 0.83 0.96 0.89  Sodium 135 - 145 mmol/L 141 140 140  Potassium 3.5 - 5.1 mmol/L 3.4(L) 3.0(L) 3.4(L)  Chloride 98 - 111 mmol/L 104 103 105  CO2 22 - 32 mmol/L _1 Calcium 8.9 - 10.3 mg/dL 9.2 9.1 9.2  Total Protein 6.5 - 8.1 g/dL 7.2 - -  Total Bilirubin 0.3 - 1.2 mg/dL 0.3 - -  Alkaline Phos 38 - 126 U/L 79 - -  AST 15 - 41 U/L 17 - -  ALT 0 - 44 U/L 10 - -      RADIOGRAPHIC STUDIES: I have personally reviewed the radiological images as listed and agreed with the findings in the report. No results found.   ASSESSMENT & PLAN:  Caitlin Cohen is a 53 y.o. female with    1. 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 She is s/p right breastlumpectomy, adjuvant chemo TC and adjuvant radiation.  -She started anti-estrogen therapy with Tamoxifen in 12/2017.Tolerating well with manageable hot flashes. Due to recent bone metastasis, Tamoxifen was stopped on 10/29/2018.  -Based on recent CT CAP/bone scan she has bone lesions highly concerning for bone  metastasis.  She also has an indeterminate 1.3 cm liver lesion -She underwent L2 kyphoplasty for compression fracture by Dr. Janit Bern biopsy showed triple negative breast cancer.  -I repeated bone biopsy from the right iliac lesion, which showed ER and PR positive HER-2 negative right-sided breast cancer.  I believe the first bone biopsy ER and PR was falsely negative due to the decalcification.  -Given  she has metastasis, her cancer is stage IV and no longer curable. Her cancer is still treatable to control her disease.  -She started palliative RT with Dr. Isidore Moos on 12/02/18. Plan to complete on 12/16/18 --Given her ER/PR positive metastasis, I recommend antiestrogen therapy with aromatase inhibitor daily and oral CD4K inhibitor Ibrance 3 weeks on/1 week off. I reviewed side effects include low blood counts, especially neutropenia, fatigue, abnormal liver functions, nausea, diarrhea, etc. She is agreeable. Plan to start Ibrance in 2 weeks when she receives it. -she is still premenopausal based on the [facility] from July 2020. I recommend ovarian suppression first, and consider BSO down the road, she is agreeable.  -will start Zoladex injection next week, she will restart tamoxifen next week, and transition to letrozole after second dose of Zoladex injection. -I would recommend monthly Fulvestrant injections for second line treatment.  -Based on her 10/26/18 hormonal levels she is still perimenopausal. Given she would have to be postmenopausal for AI to be efficient, I recommend starting ovarian suppression with monthly Zoladex injections then start Letrozole with the 2nd injection. --The potential benefit and side effects, which includes but not limited to, hot flash, skin and vaginal dryness, metabolic changes ( increased blood glucose, cholesterol, weight, etc.), slightly in increased risk of cardiovascular disease, cataracts, muscular and joint discomfort, osteopenia and osteoporosis, etc, were discussed with  her in great details.  -the goal of therapy is palliative, we discussed that her disease is not curable unfortunately, but treatable. -While she waits for Letrozole, she will continue Tamoxifen.  -I discussed she is eligible for blood clinical trail study. She is interested. I will set her up with Research Nurse.  -F/u in 4 weeks with cycle 2 Ibrance and 2nd Zoladex injection.    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 -Her 09/2018 MRI spine was concerning for bone metastasisand L2 spine fracture, likely pathologic. -CT CAP from 10/27/18 and bone scan from 7/15/20whichshowuptake in left 3rd and 6th ribs, right iliac crest andlarge 4.9cm lytic lesion inL2 with pathologic fracture.  -She underwent L2 Kyphoplasty and biopsy by Dr. Rolena Infante on 11/12/18.  -Her back pain has improved after kyphoplasty. She ambulated with cane. I suggest she request PT referral from her surgeon.  -She completed L2 palliative RT (12/02/18-12/09/18) and will start palliative Rt to right iliac lesion 12/10/18 and plans to complete on 12/16/18 with Dr. Isidore Moos.  -Her pain has improved in back and right groin area, now 5/10. She is now taking MS Contin once daily and trying to reduce oxycodone from 69m BID.  -I encouraged her to continue to wean off MS Contin. I will call in Gabapentin 3075mto take 2-3 times a day as needed (12/09/18). She is agreeable.  -Her 12/03/18 bone biopsy without decalcification showed her metastasis to be ER/PE positive and her2 negative.  -I highly recommend she start calcium daily and 1000 units Vitamin. She is agreeable.  -I also discussed the option of Zometa injection q3m17monthhich is a bisphosphonate that can help her breast cancer and strengthen her bone. I reviewed side effects of jaw necrosis. I strongly encouraged her to stay up to date with her dental check up and  notify us [place] any major dental procedures. She is interested. Will start in 1 week    3. Depression/Anxiety  -She has a hx of depression and was previously treated with medication. Not currently on medication  and she feels that her depression is controlled. -due to herprobable metastatic disease, he is overwhelmed and crying during hervisit, she has good supportfrom her sister and children -I previously offered her the chance to talk to our SW about her current state. She declined for now.  4. Goal of care discussion  -We again discussed the incurable nature of her cancer, and the overall poor prognosis, especially if she does not have good response to chemotherapy or progress on chemo -The patient understands the goal of care is palliative. -she is full code now    Plan -I refilled Gabapentin 380m today -Start Zometa and Zoladex injections in 1 week -restartTamoxifen next week, and I will switch her to letrozole after next visit  -I prescribed her Ibrance today  -Lab, f/u and injection in 4 weeks  -she is interested in blood study, will let research nurse contact her     No problem-specific Assessment & Plan notes found for this encounter.   No orders of the defined types were placed in this encounter.  All questions were answered. The patient knows to call the clinic with any problems, questions or concerns. No barriers to learning was detected. I spent 30 minutes counseling the patient face to face. The total time spent in the appointment was 40 minutes and more than 50% was on counseling and review of test results     YTruitt Merle MD 12/09/2018   I, AJoslyn Devon am acting as scribe for YTruitt Merle MD.   I have reviewed the above documentation for accuracy and completeness, and I agree with the above.

## 2018-12-10 ENCOUNTER — Other Ambulatory Visit: Payer: Self-pay | Admitting: *Deleted

## 2018-12-10 ENCOUNTER — Inpatient Hospital Stay: Payer: BC Managed Care – PPO

## 2018-12-10 ENCOUNTER — Ambulatory Visit
Admission: RE | Admit: 2018-12-10 | Discharge: 2018-12-10 | Disposition: A | Discharge status: Home / Self Care | Payer: BC Managed Care – PPO | Source: Ambulatory Visit | Attending: Radiation Oncology | Admitting: Radiation Oncology

## 2018-12-10 ENCOUNTER — Encounter: Payer: Self-pay | Admitting: *Deleted

## 2018-12-10 ENCOUNTER — Telehealth: Payer: Self-pay

## 2018-12-10 ENCOUNTER — Telehealth: Payer: Self-pay | Admitting: Pharmacist

## 2018-12-10 ENCOUNTER — Other Ambulatory Visit: Payer: Self-pay

## 2018-12-10 ENCOUNTER — Inpatient Hospital Stay: Payer: BC Managed Care – PPO | Admitting: *Deleted

## 2018-12-10 ENCOUNTER — Telehealth: Payer: Self-pay | Admitting: Hematology

## 2018-12-10 DIAGNOSIS — C7951 Secondary malignant neoplasm of bone: Secondary | ICD-10-CM

## 2018-12-10 LAB — CANCER ANTIGEN 27.29: CA 27.29: 35.6 U/mL (ref 0.0–38.6)

## 2018-12-10 NOTE — Telephone Encounter (Signed)
Oral Oncology Pharmacist Encounter  Received new prescription for Ibrance (palbociclib) for the treatment of metastatic, hormone receptor positive, HER2 receptor negative breast cancer in conjunction with Faslodex (fulvestrant) injections, planned duration until disease progression or unacceptable toxicity.  Original diagnosis in March 2019 with stage II disease Patient was treated at that time with lumpectomy, followed by adjuvant docetaxel/cyclophosphamide x 4 cycles (4/26-19-10/13/17), followed by adjuvant chest wall irradiation (11/10/17-12/08/17), and placed on maintenance endocrine therapy with tamoxifen. Patient now with disease recurrence to the bone. Patient will receive palliative radiation to the iliac crest and then is planned to initiate treatment with Ibrance at 125 mg by mouth once daily for 21 days on, 7 days off, repeat every 28 days in conjuction with fulvestrant loading and maintenance.  Labs from 12/09/18 assessed, OK for treatment initation.  Current medication list in Epic reviewed, no DDIs with Ibrance identified.  Prescription has been e-scribed to the [facility] for benefits analysis and approval by MD.  Oral Oncology Clinic will continue to follow for insurance authorization, copayment issues, initial counseling and start date.  Johny Drilling, PharmD, BCPS, BCOP  12/10/2018 1:59 PM Oral Oncology Clinic [phone]

## 2018-12-10 NOTE — Telephone Encounter (Signed)
Scheduled appt per 8/26 los and staff message.  Patient aware of her appt date and time.

## 2018-12-10 NOTE — Telephone Encounter (Signed)
Oral Oncology Patient Advocate Encounter  Received notification from [facility] that prior authorization for Caitlin Cohen is required.  PA submitted on CoverMyMeds Key [id number] Status is pending  Oral Oncology Clinic will continue to follow.  [name] [place] Phone [phone] Fax [phone] 12/10/2018    3:10 PM

## 2018-12-10 NOTE — Research (Signed)
12/10/2018 at 2:47pm - [facility] Metastatic Breast Cancer study- Dr. Feng informed the research department today that the pt was interested in this study.  The pt agreed to meet with the research nurse after her radiation appointment today to go over the consent form and hipaa form.  The pt's eligibility was confirmed by 2 RN's, Stacey Phelps and Nikki Eldreth.  The pt has received several radiation treatments, but the study Project Manager, Heather Madden, stated that the pt could enroll on the trial. The pt met with the research nurse for 20 minutes going over the forms.  The pt had no questions or concerns about her participation.  The pt signed the consent form and the hipaa form.  A copy of the consent form and hipaa form was given to the pt for her records.  The pt then was taken to the lab, and her research tubes were obtained by venipuncture.  The pt signed for her gift card, and the gift card was given to the pt.  The pt was thanked for her support and participation in this trial.  Nikki L. Eldreth RN, BSN, CCRP Clinical Research Nurse 12/10/2018 3:11 PM  

## 2018-12-11 ENCOUNTER — Other Ambulatory Visit: Payer: Self-pay

## 2018-12-11 ENCOUNTER — Ambulatory Visit
Admission: RE | Admit: 2018-12-11 | Discharge: 2018-12-11 | Disposition: A | Discharge status: Home / Self Care | Payer: BC Managed Care – PPO | Source: Ambulatory Visit | Attending: Radiation Oncology | Admitting: Radiation Oncology

## 2018-12-11 DIAGNOSIS — C7951 Secondary malignant neoplasm of bone: Secondary | ICD-10-CM | POA: Diagnosis not present

## 2018-12-11 NOTE — Telephone Encounter (Signed)
Oral Oncology Patient Advocate Encounter  Prior Authorization for Caitlin Cohen has been approved.    PA# [id number] Effective dates: 12/10/18 through 12/09/19  Oral Oncology Clinic will continue to follow.   [place] Patient [place] Phone [phone] Fax [phone] 12/11/2018    8:17 AM

## 2018-12-11 NOTE — Telephone Encounter (Signed)
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   Counseled patient on administration, dosing, side effects, monitoring, drug-food interactions, safe handling, storage, and disposal.  Patient will take Ibrance 165m tablets, 1 tablet by mouth once daily, with or without food, taken for 3 weeks on, 1 week off, and repeated.  Patient knows to avoid grapefruit and grapefruit juice while on treatment with Ibrance.  Patient will start taking Femara (letrozole) once daily starting in October. First Zoladex planned for 12/16/18 along with zoledronic acid infusion  Ibrance start date: 12/21/18  Adverse effects include but are not limited to: fatigue, hair loss, GI upset, nausea, decreased blood counts, and increased upper respiratory infections. Severe, life-threatening, and/or fatal interstitial lung disease (ILD) and/or pneumonitis may occur with CDK 4/6 inhibitors.  Patient informed she may receive a lab appointment for a WBC check on Cycle 1 Day 14 for dose and ANC assessment.  Reviewed with patient importance of keeping a medication schedule and plan for any missed doses.  Medication reconciliation performed and medication/allergy list updated.  Insurance authorization for IL[place]lee Homehas been obtained. Test claim at the pharmacy revealed copayment $0 for 1st fill of Ibrance. [facility]  Patient informed the pharmacy will reach out 5-7 days prior to needing next fill of Ibrance to coordinate continued medication acquisition to prevent break in therapy.  All questions answered.  Ms. HRuppelvoiced understanding and appreciation.    Patient knows to call the office with questions or concerns.  [name] PharmD, BCPS, BCOP  12/11/2018   11:47 AM [facility]cology [facility] [phone]

## 2018-12-14 ENCOUNTER — Ambulatory Visit
Admission: RE | Admit: 2018-12-14 | Discharge: 2018-12-14 | Disposition: A | Discharge status: Home / Self Care | Payer: BC Managed Care – PPO | Source: Ambulatory Visit | Attending: Radiation Oncology | Admitting: Radiation Oncology

## 2018-12-14 DIAGNOSIS — C7951 Secondary malignant neoplasm of bone: Secondary | ICD-10-CM | POA: Diagnosis not present

## 2018-12-15 ENCOUNTER — Ambulatory Visit: Payer: Medicaid Other

## 2018-12-15 ENCOUNTER — Encounter: Payer: Self-pay | Admitting: General Practice

## 2018-12-15 MED FILL — IBRANCE 125 MG TABS: 125 | 28 days supply | Qty: 21 | Fill #0

## 2018-12-15 NOTE — Progress Notes (Signed)
[place] CSW Progress Notes  Call from patient, concerned that she had not heard from Estate manager/land agent or [employer].  CSW called patient to follow up - she has now connected w [facility] and received required paperwork to begin application for disability.  CSW also referred to [employer] for help w [employer].  Pt just was called by [employer].  Pt also needed information to support her disability applications - message sent to [employer] [place] to call patient and patient also encouraged to bring information needed to today's appointment at [facility].  Edwyna Shell, LCSW Clinical Social Worker Phone:  [phone]

## 2018-12-16 ENCOUNTER — Ambulatory Visit: Payer: Medicaid Other

## 2018-12-16 ENCOUNTER — Ambulatory Visit
Admission: RE | Admit: 2018-12-16 | Discharge: 2018-12-16 | Disposition: A | Discharge status: Home / Self Care | Payer: Medicaid Other | Source: Ambulatory Visit | Attending: Radiation Oncology | Admitting: Radiation Oncology

## 2018-12-16 ENCOUNTER — Other Ambulatory Visit: Payer: Self-pay | Admitting: Nurse Practitioner

## 2018-12-16 ENCOUNTER — Telehealth: Payer: Self-pay

## 2018-12-16 ENCOUNTER — Other Ambulatory Visit: Payer: Self-pay

## 2018-12-16 ENCOUNTER — Inpatient Hospital Stay: Payer: Medicaid Other | Attending: Hematology

## 2018-12-16 VITALS — BP 130/83 | HR 88 | Temp 97.8°F | Resp 20

## 2018-12-16 DIAGNOSIS — C7951 Secondary malignant neoplasm of bone: Secondary | ICD-10-CM | POA: Insufficient documentation

## 2018-12-16 DIAGNOSIS — Z5111 Encounter for antineoplastic chemotherapy: Secondary | ICD-10-CM | POA: Diagnosis not present

## 2018-12-16 DIAGNOSIS — Z510 Encounter for antineoplastic radiation therapy: Secondary | ICD-10-CM | POA: Diagnosis not present

## 2018-12-16 DIAGNOSIS — C50211 Malignant neoplasm of upper-inner quadrant of right female breast: Secondary | ICD-10-CM | POA: Insufficient documentation

## 2018-12-16 DIAGNOSIS — R110 Nausea: Secondary | ICD-10-CM

## 2018-12-16 DIAGNOSIS — Z95828 Presence of other vascular implants and grafts: Secondary | ICD-10-CM

## 2018-12-16 DIAGNOSIS — Z170 Estrogen receptor positive status [ER+]: Secondary | ICD-10-CM | POA: Insufficient documentation

## 2018-12-16 MED ORDER — ZOLEDRONIC ACID 4 MG/100ML IV SOLN
4.0000 mg | Freq: Once | INTRAVENOUS | Status: AC
  Administered 2018-12-16: 4 mg via INTRAVENOUS
  Filled 2018-12-16: qty 100

## 2018-12-16 MED ORDER — GOSERELIN ACETATE 3.6 MG ~~LOC~~ IMPL
DRUG_IMPLANT | SUBCUTANEOUS | Status: AC
  Filled 2018-12-16: qty 3.6

## 2018-12-16 MED ORDER — OXYCODONE HCL 5 MG PO TABS: 5.0000 mg | ORAL_TABLET | Freq: Four times a day (QID) | ORAL | 0 refills | Status: DC | PRN

## 2018-12-16 MED ORDER — SODIUM CHLORIDE 0.9 % IV SOLN
Freq: Once | INTRAVENOUS | Status: AC
  Administered 2018-12-16: 15:00:00 via INTRAVENOUS
  Filled 2018-12-16: qty 250

## 2018-12-16 MED ORDER — ONDANSETRON HCL 8 MG PO TABS: 8.0000 mg | ORAL_TABLET | Freq: Three times a day (TID) | ORAL | 3 refills | Status: DC | PRN

## 2018-12-16 MED ORDER — GOSERELIN ACETATE 3.6 MG ~~LOC~~ IMPL
3.6000 mg | DRUG_IMPLANT | Freq: Once | SUBCUTANEOUS | Status: AC
  Administered 2018-12-16: 15:00:00 3.6 mg via SUBCUTANEOUS

## 2018-12-16 MED ORDER — MORPHINE SULFATE ER 30 MG PO TBCR: 30.0000 mg | EXTENDED_RELEASE_TABLET | Freq: Two times a day (BID) | ORAL | 0 refills | Status: DC

## 2018-12-16 NOTE — Patient Instructions (Signed)
Zoledronic Acid injection (Hypercalcemia, Oncology) What is this medicine? ZOLEDRONIC ACID (ZOE le dron ik AS id) lowers the amount of calcium loss from bone. It is used to treat too much calcium in your blood from cancer. It is also used to prevent complications of cancer that has spread to the bone. This medicine may be used for other purposes; ask your health care provider or pharmacist if you have questions. COMMON BRAND NAME(S): Zometa What should I tell my health care provider before I take this medicine? They need to know if you have any of these conditions:  aspirin-sensitive asthma  cancer, especially if you are receiving medicines used to treat cancer  dental disease or wear dentures  infection  kidney disease  receiving corticosteroids like dexamethasone or prednisone  an unusual or allergic reaction to zoledronic acid, other medicines, foods, dyes, or preservatives  pregnant or trying to get pregnant  breast-feeding How should I use this medicine? This medicine is for infusion into a vein. It is given by a health care professional in a hospital or clinic setting. Talk to your pediatrician regarding the use of this medicine in children. Special care may be needed. Overdosage: If you think you have taken too much of this medicine contact a poison control center or emergency room at once. NOTE: This medicine is only for you. Do not share this medicine with others. What if I miss a dose? It is important not to miss your dose. Call your doctor or health care professional if you are unable to keep an appointment. What may interact with this medicine?  certain antibiotics given by injection  NSAIDs, medicines for pain and inflammation, like ibuprofen or naproxen  some diuretics like bumetanide, furosemide  teriparatide  thalidomide This list may not describe all possible interactions. Give your health care provider a list of all the medicines, herbs, non-prescription  drugs, or dietary supplements you use. Also tell them if you smoke, drink alcohol, or use illegal drugs. Some items may interact with your medicine. What should I watch for while using this medicine? Visit your doctor or health care professional for regular checkups. It may be some time before you see the benefit from this medicine. Do not stop taking your medicine unless your doctor tells you to. Your doctor may order blood tests or other tests to see how you are doing. Women should inform their doctor if they wish to become pregnant or think they might be pregnant. There is a potential for serious side effects to an unborn child. Talk to your health care professional or pharmacist for more information. You should make sure that you get enough calcium and vitamin D while you are taking this medicine. Discuss the foods you eat and the vitamins you take with your health care professional. Some people who take this medicine have severe bone, joint, and/or muscle pain. This medicine may also increase your risk for jaw problems or a broken thigh bone. Tell your doctor right away if you have severe pain in your jaw, bones, joints, or muscles. Tell your doctor if you have any pain that does not go away or that gets worse. Tell your dentist and dental surgeon that you are taking this medicine. You should not have major dental surgery while on this medicine. See your dentist to have a dental exam and fix any dental problems before starting this medicine. Take good care of your teeth while on this medicine. Make sure you see your dentist for regular follow-up   appointments. What side effects may I notice from receiving this medicine? Side effects that you should report to your doctor or health care professional as soon as possible:  allergic reactions like skin rash, itching or hives, swelling of the face, lips, or tongue  anxiety, confusion, or depression  breathing problems  changes in vision  eye  pain  feeling faint or lightheaded, falls  jaw pain, especially after dental work  mouth sores  muscle cramps, stiffness, or weakness  redness, blistering, peeling or loosening of the skin, including inside the mouth  trouble passing urine or change in the amount of urine Side effects that usually do not require medical attention (report to your doctor or health care professional if they continue or are bothersome):  bone, joint, or muscle pain  constipation  diarrhea  fever  hair loss  irritation at site where injected  loss of appetite  nausea, vomiting  stomach upset  trouble sleeping  trouble swallowing  weak or tired This list may not describe all possible side effects. Call your doctor for medical advice about side effects. You may report side effects to FDA at 1-800-FDA-1088. Where should I keep my medicine? This drug is given in a hospital or clinic and will not be stored at home. NOTE: This sheet is a summary. It may not cover all possible information. If you have questions about this medicine, talk to your doctor, pharmacist, or health care provider.  2020 Elsevier/Gold Standard (2013-08-28 14:19:39)  Goserelin injection What is this medicine? GOSERELIN (GOE se rel in) is similar to a hormone found in the body. It lowers the amount of sex hormones that the body makes. Men will have lower testosterone levels and women will have lower estrogen levels while taking this medicine. In men, this medicine is used to treat prostate cancer; the injection is either given once per month or once every 12 weeks. A once per month injection (only) is used to treat women with endometriosis, dysfunctional uterine bleeding, or advanced breast cancer. This medicine may be used for other purposes; ask your health care provider or pharmacist if you have questions. COMMON BRAND NAME(S): Zoladex What should I tell my health care provider before I take this medicine? They need to  know if you have any of these conditions:  bone problems  diabetes  heart disease  history of irregular heartbeat  an unusual or allergic reaction to goserelin, other medicines, foods, dyes, or preservatives  pregnant or trying to get pregnant  breast-feeding How should I use this medicine? This medicine is for injection under the skin. It is given by a health care professional in a hospital or clinic setting. Talk to your pediatrician regarding the use of this medicine in children. Special care may be needed. Overdosage: If you think you have taken too much of this medicine contact a poison control center or emergency room at once. NOTE: This medicine is only for you. Do not share this medicine with others. What if I miss a dose? It is important not to miss your dose. Call your doctor or health care professional if you are unable to keep an appointment. What may interact with this medicine? Do not take this medicine with any of the following medications:  cisapride  dronedarone  pimozide  thioridazine This medicine may also interact with the following medications:  other medicines that prolong the QT interval (an abnormal heart rhythm) This list may not describe all possible interactions. Give your health care provider a   list of all the medicines, herbs, non-prescription drugs, or dietary supplements you use. Also tell them if you smoke, drink alcohol, or use illegal drugs. Some items may interact with your medicine. What should I watch for while using this medicine? Visit your doctor or health care provider for regular checks on your progress. Your symptoms may appear to get worse during the first weeks of this therapy. Tell your doctor or healthcare provider if your symptoms do not start to get better or if they get worse after this time. Your bones may get weaker if you take this medicine for a long time. If you smoke or frequently drink alcohol you may increase your risk of  bone loss. A family history of osteoporosis, chronic use of drugs for seizures (convulsions), or corticosteroids can also increase your risk of bone loss. Talk to your doctor about how to keep your bones strong. This medicine should stop regular monthly menstruation in women. Tell your doctor if you continue to menstruate. Women should not become pregnant while taking this medicine or for 12 weeks after stopping this medicine. Women should inform their doctor if they wish to become pregnant or think they might be pregnant. There is a potential for serious side effects to an unborn child. Talk to your health care professional or pharmacist for more information. Do not breast-feed an infant while taking this medicine. Men should inform their doctors if they wish to father a child. This medicine may lower sperm counts. Talk to your health care professional or pharmacist for more information. This medicine may increase blood sugar. Ask your healthcare provider if changes in diet or medicines are needed if you have diabetes. What side effects may I notice from receiving this medicine? Side effects that you should report to your doctor or health care professional as soon as possible:  allergic reactions like skin rash, itching or hives, swelling of the face, lips, or tongue  bone pain  breathing problems  changes in vision  chest pain  feeling faint or lightheaded, falls  fever, chills  pain, swelling, warmth in the leg  pain, tingling, numbness in the hands or feet  signs and symptoms of high blood sugar such as being more thirsty or hungry or having to urinate more than normal. You may also feel very tired or have blurry vision  signs and symptoms of low blood pressure like dizziness; feeling faint or lightheaded, falls; unusually weak or tired  stomach pain  swelling of the ankles, feet, hands  trouble passing urine or change in the amount of urine  unusually high or low blood  pressure  unusually weak or tired Side effects that usually do not require medical attention (report to your doctor or health care professional if they continue or are bothersome):  change in sex drive or performance  changes in breast size in both males and females  changes in emotions or moods  headache  hot flashes  irritation at site where injected  loss of appetite  skin problems like acne, dry skin  vaginal dryness This list may not describe all possible side effects. Call your doctor for medical advice about side effects. You may report side effects to FDA at 1-800-FDA-1088. Where should I keep my medicine? This drug is given in a hospital or clinic and will not be stored at home. NOTE: This sheet is a summary. It may not cover all possible information. If you have questions about this medicine, talk to your doctor, pharmacist, or   health care provider.  2020 Elsevier/Gold Standard (2018-07-20 14:05:56)   

## 2018-12-16 NOTE — Telephone Encounter (Signed)
Patient calls for refills on MS Contin, Oxycodone and Zofran, sent into [facility] on file.

## 2018-12-17 ENCOUNTER — Ambulatory Visit: Payer: Medicaid Other

## 2018-12-17 ENCOUNTER — Telehealth: Payer: Self-pay

## 2018-12-17 ENCOUNTER — Ambulatory Visit
Admission: RE | Admit: 2018-12-17 | Discharge: 2018-12-17 | Disposition: A | Discharge status: Home / Self Care | Payer: Medicaid Other | Source: Ambulatory Visit | Attending: Radiation Oncology | Admitting: Radiation Oncology

## 2018-12-17 ENCOUNTER — Other Ambulatory Visit: Payer: Self-pay

## 2018-12-17 DIAGNOSIS — C7951 Secondary malignant neoplasm of bone: Secondary | ICD-10-CM | POA: Diagnosis not present

## 2018-12-17 MED FILL — IBRANCE 125 MG TABS: 125 | 28 days supply | Qty: 21 | Fill #0

## 2018-12-17 NOTE — Telephone Encounter (Signed)
Oral Oncology Patient Advocate Encounter  Confirmed with [place] that Caitlin Cohen was picked up on 12/17/18 with a $0 copay using a copay card.   [place] Patient [name] Phone [phone] Fax [phone] 12/17/2018   12:08 PM

## 2018-12-17 NOTE — Telephone Encounter (Signed)
Oral Oncology Patient Advocate Encounter  Caitlin Cohen copay is $200. I was able to secure the patient a copay card from [place] to make her out of pocket cost $0. The copay card information has been shared with [place] and is as follows.  BIN: [id number] Grp: [id number] ID: [id number]  Exp. 04/14/2021  [place] Patient [place] Phone [phone] Fax [phone] 12/17/2018   12:05 PM

## 2018-12-18 ENCOUNTER — Encounter: Payer: Self-pay | Admitting: Radiation Oncology

## 2018-12-18 ENCOUNTER — Ambulatory Visit
Admission: RE | Admit: 2018-12-18 | Discharge: 2018-12-18 | Disposition: A | Discharge status: Home / Self Care | Payer: Medicaid Other | Source: Ambulatory Visit | Attending: Radiation Oncology | Admitting: Radiation Oncology

## 2018-12-18 ENCOUNTER — Other Ambulatory Visit: Payer: Self-pay

## 2018-12-18 DIAGNOSIS — C7951 Secondary malignant neoplasm of bone: Secondary | ICD-10-CM | POA: Diagnosis not present

## 2018-12-22 ENCOUNTER — Other Ambulatory Visit: Payer: Self-pay

## 2018-12-22 ENCOUNTER — Encounter (HOSPITAL_COMMUNITY): Payer: Self-pay | Admitting: Emergency Medicine

## 2018-12-22 ENCOUNTER — Emergency Department (HOSPITAL_COMMUNITY)
Admission: EM | Admit: 2018-12-22 | Discharge: 2018-12-22 | Disposition: A | Discharge status: Home / Self Care | Payer: Medicaid Other | Attending: Emergency Medicine | Admitting: Emergency Medicine

## 2018-12-22 DIAGNOSIS — Z79899 Other long term (current) drug therapy: Secondary | ICD-10-CM | POA: Diagnosis not present

## 2018-12-22 DIAGNOSIS — E119 Type 2 diabetes mellitus without complications: Secondary | ICD-10-CM | POA: Diagnosis not present

## 2018-12-22 DIAGNOSIS — Z853 Personal history of malignant neoplasm of breast: Secondary | ICD-10-CM | POA: Insufficient documentation

## 2018-12-22 DIAGNOSIS — R112 Nausea with vomiting, unspecified: Secondary | ICD-10-CM

## 2018-12-22 DIAGNOSIS — Z7984 Long term (current) use of oral hypoglycemic drugs: Secondary | ICD-10-CM | POA: Diagnosis not present

## 2018-12-22 DIAGNOSIS — I10 Essential (primary) hypertension: Secondary | ICD-10-CM

## 2018-12-22 LAB — COMPREHENSIVE METABOLIC PANEL
ALT: 16 U/L (ref 0–44)
AST: 25 U/L (ref 15–41)
Albumin: 3.6 g/dL (ref 3.5–5.0)
Alkaline Phosphatase: 57 U/L (ref 38–126)
Anion gap: 8 (ref 5–15)
BUN: 10 mg/dL (ref 6–20)
CO2: 23 mmol/L (ref 22–32)
Calcium: 7.2 mg/dL — ABNORMAL LOW (ref 8.9–10.3)
Chloride: 109 mmol/L (ref 98–111)
Creatinine, Ser: 0.69 mg/dL (ref 0.44–1.00)
GFR calc Af Amer: >60 mL/min (ref >60)
GFR calc non Af Amer: >60 mL/min (ref >60)
Glucose, Bld: 111 mg/dL — ABNORMAL HIGH (ref 70–99)
Potassium: 3.6 mmol/L (ref 3.5–5.1)
Sodium: 140 mmol/L (ref 135–145)
Total Bilirubin: 0.6 mg/dL (ref 0.3–1.2)
Total Protein: 7.2 g/dL (ref 6.5–8.1)

## 2018-12-22 LAB — CBC
HCT: 40.1 % (ref 36.0–46.0)
Hemoglobin: 12.7 g/dL (ref 12.0–15.0)
MCH: 27.6 pg (ref 26.0–34.0)
MCHC: 31.7 g/dL (ref 30.0–36.0)
MCV: 87.2 fL (ref 80.0–100.0)
Platelets: 222 10*3/uL (ref 150–400)
RBC: 4.6 MIL/uL (ref 3.87–5.11)
RDW: 14.9 % (ref 11.5–15.5)
WBC: 5.2 10*3/uL (ref 4.0–10.5)
nRBC: 0 % (ref 0.0–0.2)

## 2018-12-22 LAB — URINALYSIS, ROUTINE W REFLEX MICROSCOPIC
Bilirubin Urine: NEGATIVE
Glucose, UA: NEGATIVE mg/dL
Hgb urine dipstick: NEGATIVE
Ketones, ur: 5 mg/dL — AB
Leukocytes,Ua: NEGATIVE
Nitrite: NEGATIVE
Protein, ur: NEGATIVE mg/dL
Specific Gravity, Urine: 1.016 (ref 1.005–1.030)
pH: 8 (ref 5.0–8.0)

## 2018-12-22 LAB — PHOSPHORUS: Phosphorus: 1.5 mg/dL — ABNORMAL LOW (ref 2.5–4.6)

## 2018-12-22 MED ORDER — ONDANSETRON HCL 4 MG/2ML IJ SOLN
4.0000 mg | Freq: Once | INTRAMUSCULAR | Status: AC
  Administered 2018-12-22: 13:00:00 4 mg via INTRAVENOUS
  Filled 2018-12-22: qty 2

## 2018-12-22 MED ORDER — SODIUM CHLORIDE 0.9 % IV SOLN: 1.0000 g | Freq: Once | INTRAVENOUS | Status: DC

## 2018-12-22 MED ORDER — K PHOS MONO-SOD PHOS DI & MONO 155-852-130 MG PO TABS
250.0000 mg | ORAL_TABLET | Freq: Once | ORAL | Status: AC
  Administered 2018-12-22: 250 mg via ORAL
  Filled 2018-12-22: qty 1

## 2018-12-22 MED ORDER — SODIUM CHLORIDE 0.9 % IV BOLUS
1000.0000 mL | Freq: Once | INTRAVENOUS | Status: AC
  Administered 2018-12-22: 1000 mL via INTRAVENOUS

## 2018-12-22 MED ORDER — CALCIUM CARBONATE ANTACID 500 MG PO CHEW
2.0000 | CHEWABLE_TABLET | Freq: Once | ORAL | Status: AC
  Administered 2018-12-22: 16:00:00 400 mg via ORAL
  Filled 2018-12-22: qty 2

## 2018-12-22 MED ORDER — CA & PHOS-VIT D-MAG 333-80-133-133 PO TABS: 1.0000 | ORAL_TABLET | Freq: Every day | ORAL | 0 refills | Status: DC

## 2018-12-22 NOTE — ED Notes (Signed)
Patient given ginger ale. 

## 2018-12-22 NOTE — ED Provider Notes (Addendum)
[facility] DEPT Provider Note   CSN: [id number] Arrival date & time: 12/22/18  1146     History   Chief Complaint Chief Complaint  Patient presents with  . Weakness    HPI Mildreth Schenck is a 53 y.o. female.     Patient with hx metastatic breast ca, c/o intermittent nausea and vomiting in past couple days. Symptoms acute onset, episodic, recurrent in past 2 days. Emesis clear or color recently ingested fluids, not bloody or bilious. Did have a few episodes of diarrhea, not bloody. Normal bm this AM. No abd distension. Denies abd pain. States completed 2 weeks of radiation txs for pelvis/spine bone mets and end of last week. Denies fever. No chest pain or discomfort. No sob. No cough or uri symptoms. No dysuria or gu c/o.   The history is provided by the patient.  Weakness Associated symptoms: nausea and vomiting   Associated symptoms: no abdominal pain, no chest pain, no cough, no dysuria, no fever, no headaches and no shortness of breath     Past Medical History:  Diagnosis Date  . Anemia   . Anxiety   . Cancer ([place]) dx'd 06/2017   breast cancer right  . Depression   . Diabetes mellitus without complication ([place])   . GERD (gastroesophageal reflux disease)   . Headache   . History of radiation therapy 11/10/17- 12/08/17   right breast, 40.05 Gy in 15 fractions. Right breast boost 10 Gy in 5 fractions.   . Personal history of chemotherapy   . Personal history of radiation therapy     Patient Active Problem List   Diagnosis Date Noted  . Bone metastases ([place]) 11/30/2018  . S/P kyphoplasty 11/12/2018  . Pathologic compression fracture of lumbar vertebra ([place]) 11/12/2018  . DM (diabetes mellitus), type 2 ([place]) 09/28/2017  . Febrile neutropenia ([place]) 09/26/2017  . Sepsis ([place]) 09/26/2017  . Diarrhea 09/26/2017  . Port-A-Cath in place 08/29/2017  . Malignant neoplasm of upper-inner quadrant of right breast in female, estrogen receptor  positive ([place]) 07/01/2017  . Mood disorder ([place]) 07/05/2013    Class: Acute    Past Surgical History:  Procedure Laterality Date  . CERVICAL CONE BIOPSY    . CHOLECYSTECTOMY    . IR RADIOLOGIST EVAL & MGMT  11/03/2018  . KYPHOPLASTY N/A 11/12/2018   Procedure: LUMBAR TWO  BIOPSY AND LUMBAR TWO KYPHOPLASTY;  Surgeon: Melina Schools, MD;  Location: [facility];  Service: Orthopedics;  Laterality: N/A;  90 mins  . MASTECTOMY WITH RADIOACTIVE SEED GUIDED EXCISION AND AXILLARY SENTINEL LYMPH NODE BIOPSY Right 07/08/2017   Procedure: RIGHT LUMPTECTOMY WITH RADIOACTIVE SEED GUIDED EXCISION AND RIGHT AXILLARY SENTINEL LYMPH NODE BIOPSY ERAS PATHWAY;  Surgeon: [name], MD;  Location: [facility];  Service: General;  Laterality: Right;  PEC BLOCK  . OPEN REDUCTION INTERNAL FIXATION (ORIF) DISTAL RADIAL FRACTURE  03/17/2012   Procedure: OPEN REDUCTION INTERNAL FIXATION (ORIF) DISTAL RADIAL FRACTURE;  Surgeon: Roseanne Kaufman, MD;  Location: [facility];  Service: Orthopedics;  Laterality: Right;  Pre-operative a supra-clavical block right arm in addition to general anesthesia  . PORT-A-CATH REMOVAL Right 02/02/2018   Procedure: REMOVAL PORT-A-CATH;  Surgeon: [name], MD;  Location: [facility];  Service: General;  Laterality: Right;  . PORTACATH PLACEMENT N/A 07/30/2017   Procedure: INSERTION PORT-A-CATH WITH [place];  Surgeon: [name], MD;  Location: [facility];  Service: General;  Laterality: N/A;  . TUBAL LIGATION       OB  History    Gravida  3   Para      Term      Preterm      AB      Living  3     SAB      TAB      Ectopic      Multiple      Live Births  3            Home Medications    Prior to Admission medications   Medication Sig Start Date End Date Taking? Authorizing Provider  gabapentin (NEURONTIN) 300 MG capsule Take 1 capsule (300 mg total) by mouth 3 (three) times daily. 12/09/18  Yes Truitt Merle, MD  metFORMIN (GLUCOPHAGE) 500 MG tablet Take 1  tablet (500 mg total) by mouth 2 (two) times daily with a meal. 06/19/18  Yes Truitt Merle, MD  morphine (MS CONTIN) 30 MG 12 hr tablet Take 1 tablet (30 mg total) by mouth every 12 (twelve) hours. 12/16/18  Yes [name], NP  ondansetron (ZOFRAN) 8 MG tablet Take 1 tablet (8 mg total) by mouth every 8 (eight) hours as needed for nausea or vomiting. 12/16/18  Yes [name], NP  oxyCODONE (OXY IR/ROXICODONE) 5 MG immediate release tablet Take 1-2 tablets (5-10 mg total) by mouth every 6 (six) hours as needed for severe pain. 12/16/18  Yes [name], NP  polyethylene glycol (MIRALAX) 17 g packet Take 17 g by mouth daily as needed. Patient taking differently: Take 17 g by mouth daily as needed for mild constipation.  10/06/18  Yes Robyn Haber, MD  gabapentin (NEURONTIN) 100 MG capsule Take 1 capsule in the morning and 3 capsules at bedtime Patient not taking: Reported on 12/22/2018 08/11/18   [name], NP  palbociclib [facility]) 125 MG tablet Take 1 tablet (125 mg total) by mouth daily. Take for 21 days on, 7 days off, repeat every 28 days. 12/09/18   Truitt Merle, MD    Family History Family History  Problem Relation Age of Onset  . Hypertension Mother   . Diabetes Father   . Hypertension Father     Social History Social History   Tobacco Use  . Smoking status: Never Smoker  . Smokeless tobacco: Never Used  Substance Use Topics  . Alcohol use: Yes    Comment: occassionally  . Drug use: No     Allergies   Patient has no known allergies.   Review of Systems Review of Systems  Constitutional: Negative for chills and fever.  HENT: Negative for sore throat.   Eyes: Negative for redness.  Respiratory: Negative for cough and shortness of breath.   Cardiovascular: Negative for chest pain.  Gastrointestinal: Positive for nausea and vomiting. Negative for abdominal distention and abdominal pain.  Genitourinary: Negative for dysuria and flank pain.  Musculoskeletal: Negative  for back pain and neck pain.  Skin: Negative for rash.  Neurological: Positive for weakness. Negative for headaches.  Hematological: Does not bruise/bleed easily.  Psychiatric/Behavioral: Negative for confusion.     Physical Exam Updated Vital Signs BP (!) 150/95   Pulse 73   Temp 99 F (37.2 C) (Oral)   Resp 18   Ht 1.702 m (5\' 7" )   Wt 131.5 kg   SpO2 100%   BMI 45.42 kg/m   Physical Exam Vitals signs and nursing note reviewed.  Constitutional:      Appearance: Normal appearance. She is well-developed.  HENT:  Head: Atraumatic.     Nose: Nose normal.     Mouth/Throat:     Mouth: Mucous membranes are moist.  Eyes:     General: No scleral icterus.    Conjunctiva/sclera: Conjunctivae normal.  Neck:     Musculoskeletal: Normal range of motion and neck supple. No neck rigidity or muscular tenderness.     Trachea: No tracheal deviation.  Cardiovascular:     Rate and Rhythm: Normal rate and regular rhythm.     Pulses: Normal pulses.     Heart sounds: Normal heart sounds. No murmur. No friction rub. No gallop.   Pulmonary:     Effort: Pulmonary effort is normal. No respiratory distress.     Breath sounds: Normal breath sounds.  Abdominal:     General: Bowel sounds are normal. There is no distension.     Palpations: Abdomen is soft. There is no mass.     Tenderness: There is no abdominal tenderness. There is no guarding or rebound.     Hernia: No hernia is present.  Genitourinary:    Comments: No cva tenderness.  Musculoskeletal:        General: No swelling.     Right lower leg: No edema.     Left lower leg: No edema.  Skin:    General: Skin is warm and dry.     Findings: No rash.  Neurological:     Mental Status: She is alert.     Comments: Alert, speech normal. Motor/sens grossly intact bil. Steady gait.   Psychiatric:        Mood and Affect: Mood normal.      ED Treatments / Results  Labs (all labs ordered are listed, but only abnormal results are  displayed) Results for orders placed or performed during the hospital encounter of 12/22/18  CBC  Result Value Ref Range   WBC 5.2 4.0 - 10.5 K/uL   RBC 4.60 3.87 - 5.11 MIL/uL   Hemoglobin 12.7 12.0 - 15.0 g/dL   HCT 40.1 36.0 - 46.0 %   MCV 87.2 80.0 - 100.0 fL   MCH 27.6 26.0 - 34.0 pg   MCHC 31.7 30.0 - 36.0 g/dL   RDW 14.9 11.5 - 15.5 %   Platelets 222 150 - 400 K/uL   nRBC 0.0 0.0 - 0.2 %  Comprehensive metabolic panel  Result Value Ref Range   Sodium 140 135 - 145 mmol/L   Potassium 3.6 3.5 - 5.1 mmol/L   Chloride 109 98 - 111 mmol/L   CO2 23 22 - 32 mmol/L   Glucose, Bld 111 (H) 70 - 99 mg/dL   BUN 10 6 - 20 mg/dL   Creatinine, Ser 0.69 0.44 - 1.00 mg/dL   Calcium 7.2 (L) 8.9 - 10.3 mg/dL   Total Protein 7.2 6.5 - 8.1 g/dL   Albumin 3.6 3.5 - 5.0 g/dL   AST 25 15 - 41 U/L   ALT 16 0 - 44 U/L   Alkaline Phosphatase 57 38 - 126 U/L   Total Bilirubin 0.6 0.3 - 1.2 mg/dL   GFR calc non Af Amer >60 >60 mL/min   GFR calc Af Amer >60 >60 mL/min   Anion gap 8 5 - 15  Urinalysis, Routine w reflex microscopic  Result Value Ref Range   Color, Urine YELLOW YELLOW   APPearance CLEAR CLEAR   Specific Gravity, Urine 1.016 1.005 - 1.030   pH 8.0 5.0 - 8.0   Glucose, UA NEGATIVE NEGATIVE mg/dL  Hgb urine dipstick NEGATIVE NEGATIVE   Bilirubin Urine NEGATIVE NEGATIVE   Ketones, ur 5 (A) NEGATIVE mg/dL   Protein, ur NEGATIVE NEGATIVE mg/dL   Nitrite NEGATIVE NEGATIVE   Leukocytes,Ua NEGATIVE NEGATIVE   Ct Biopsy  Result Date: 12/03/2018 INDICATION: 53 year old female with a history of breast carcinoma and metastatic disease. She has been referred for biopsy of a soft tissue metastatic implant of the right iliac bone. EXAM: CT BIOPSY MEDICATIONS: None. ANESTHESIA/SEDATION: Moderate (conscious) sedation was employed during this procedure. A total of Versed 2.0 mg and Fentanyl 100 mcg was administered intravenously. Moderate Sedation Time: 10 minutes. The patient's level of  consciousness and vital signs were monitored continuously by radiology nursing throughout the procedure under my direct supervision. FLUOROSCOPY TIME:  CT COMPLICATIONS: None PROCEDURE: Informed written consent was obtained from the patient after a thorough discussion of the procedural risks, benefits and alternatives. All questions were addressed. Maximal Sterile Barrier Technique was utilized including caps, mask, sterile gowns, sterile gloves, sterile drape, hand hygiene and skin antiseptic. A timeout was performed prior to the initiation of the procedure. Patient positioned supine position on the CT gantry table. Scout images were acquired. Patient is prepped and draped in the usual sterile fashion. 1% lidocaine was used for local anesthesia. Using CT guidance, 17 gauge guide needle was advanced into the soft tissue lesion of the right iliac crest. Multiple 18 gauge core biopsy were achieved. Needle was removed and a sterile bandage was placed. Patient tolerated the procedure well and remained hemodynamically stable throughout. No complications were encountered and no significant blood loss. IMPRESSION: Status post CT-guided biopsy of right iliac crest soft tissue implant. Signed, Dulcy Fanny. Dellia Nims, RPVI [facility] Radiology Electronically Signed   By: Corrie Mckusick D.O.   On: 12/03/2018 10:57    EKG EKG Interpretation  Date/Time:  Tuesday December 22 2018 16:27:02 EDT Ventricular Rate:  74 PR Interval:    QRS Duration: 98 QT Interval:  440 QTC Calculation: 489 R Axis:   39 Text Interpretation:  Sinus rhythm Baseline wander No significant change since last tracing Confirmed by Lajean Saver [phone]) on 12/22/2018 4:32:07 PM   Radiology No results found.  Procedures Procedures (including critical care time)  Medications Ordered in ED Medications  sodium chloride 0.9 % bolus 1,000 mL (1,000 mLs Intravenous New Bag/Given (Non-Interop) 12/22/18  1310)  ondansetron (ZOFRAN) injection 4 mg (4 mg Intravenous Given 12/22/18 1310)     Initial Impression / Assessment and Plan / ED Course  I have reviewed the triage vital signs and the nursing notes.  Pertinent labs & imaging results that were available during my care of the patient were reviewed by me and considered in my medical decision making (see chart for details).  Iv ns bolus. zofran iv. Labs sent.   Reviewed nursing notes and prior charts for additional history.   Labs reviewed by me - Ca is low. Chvosteks neg. No paresthesia. No muscle weakness. Will check ecg. PTH, phos added to labs. Ca po.   ecg without QT prolongation.  Po fluids/food.  Pt tolerating po. No nausea or vomiting. Feels improved.   Discussed labs w pt.   Rec pcp/onc f/u, recheck Ca, Vit d, ca supp.  Return precautions provided.     Final Clinical Impressions(s) / ED Diagnoses   Final diagnoses:  None    ED Discharge Orders    None          Lajean Saver, MD 12/22/18  1633  

## 2018-12-22 NOTE — ED Notes (Signed)
Patient given water

## 2018-12-22 NOTE — ED Triage Notes (Addendum)
Per EMS, patient from home, c/o N/V x2 days. Just completed two weeks of radiation to treat cancer of bone. Ambulatory. A&Ox4.   During triage patient reports diarrhea today. States last vomiting occurrence x2 days ago. C/o generalized weakness and fatigue.

## 2018-12-22 NOTE — ED Notes (Signed)
Patient made aware urine sample is needed. 

## 2018-12-22 NOTE — Discharge Instructions (Addendum)
It was our pleasure to provide your ER care today - we hope that you feel better.  From today's lab tests, your calcium and phosphorus levels are low - take supplement, as well as a multivitamin, and follow up with primary care doctor in the next few days - call office in AM to arrange follow up. We also sent a parathyroid level, have them follow up on that result. Also have your blood pressure rechecked, as it is high today.   Return to ER if worse, new symptoms, persistent nausea/vomiting, new weakness, numbness, or other concern.

## 2018-12-22 NOTE — ED Notes (Signed)
Pt cannot use restroom at this time, aware urine specimen is needed.  

## 2018-12-22 NOTE — ED Notes (Signed)
ED Provider at bedside. 

## 2018-12-23 LAB — PTH, INTACT AND CALCIUM
Calcium, Total (PTH): 6.9 mg/dL — CL (ref 8.7–10.2)
PTH: 89 pg/mL — ABNORMAL HIGH (ref 15–65)

## 2018-12-25 ENCOUNTER — Telehealth: Payer: Self-pay

## 2018-12-25 DIAGNOSIS — R110 Nausea: Secondary | ICD-10-CM

## 2018-12-25 DIAGNOSIS — C7951 Secondary malignant neoplasm of bone: Secondary | ICD-10-CM

## 2018-12-25 MED ORDER — PROCHLORPERAZINE MALEATE 10 MG PO TABS: 10.0000 mg | ORAL_TABLET | Freq: Four times a day (QID) | ORAL | 1 refills | Status: DC | PRN

## 2018-12-25 NOTE — Telephone Encounter (Signed)
Patient calls with complaint of intermittent nausea and occasional vomiting, states taking the Zofran but is wanting to know if there is something else she can take.  Went to ED on 9/8 and was given fluids, having bowel movements, has not started Ibrance yet because of stomach issues, planning to start on Monday 9/14.    Consulted with Dr. Burr Medico.offered for patient to come in and be seen and to get IVF but declined to come in.  Will send in Compazine for nausea to [facility] per her request.  Advised her that if symptoms worsen over the weekend will need to go to ED, she verbalized an understanding.

## 2018-12-30 ENCOUNTER — Telehealth: Payer: Self-pay

## 2018-12-30 ENCOUNTER — Other Ambulatory Visit: Payer: Self-pay | Admitting: Hematology

## 2018-12-30 MED ORDER — OXYCODONE HCL 10 MG PO TABS: 10.0000 mg | ORAL_TABLET | Freq: Three times a day (TID) | ORAL | 0 refills | Status: DC | PRN

## 2018-12-30 NOTE — Telephone Encounter (Signed)
Patient left voicemail stating she started her Ibrance prescription this past Monday 12/28/18 and wanted our pharmacy to be made aware. She also called to request a refill on her Oxycodone 5 mg IR prescription. Faythe Casa chemo pharmacy tech called with update on Ibrance.   Returned patient's call to inquire about patient's pain level and Oxycodone use. Patient stated that her back pain has improved but her pelvic pain is still prominent (especially if she doesn't use the Oxycodone). Prescription last filled on 12/16/18 for 90 tablets; patient stated that she takes 2 tablets twice a day (morning and before bedtime) but only has 6-8 tablets left. She also stated that since starting the Ibrance on Monday she has felt more fatigued and noticed looser stools.   Will update Dr. Burr Medico on above information and call patient back with MD's instructions/recommendations.

## 2018-12-30 NOTE — Telephone Encounter (Signed)
I called in oxycodone 10mg  (she was on 5mg ) #45 tabs for her, she can use every 8 hours as needed, will refill again when I see her next on 10/1. Let her know, and if she needs to be seen sooner than 10/1. Thanks   Truitt Merle MD

## 2018-12-30 NOTE — Telephone Encounter (Signed)
Called patient back to let her know that Dr. Burr Medico sent in a prescription for Oxycodone 10mg  every 8 hours (45 tablets total). Informed her that Dr. Burr Medico will discuss her pain management regimen at patient's next appointment on 01/14/19, but if she needs to be seen before then to call the office and we will work on getting her seen sooner. Patient verbalized understanding and agreement, and denied any further concerns.

## 2018-12-30 NOTE — Telephone Encounter (Signed)
Oral Oncology Patient Advocate Encounter  I received a call from the nurse that the patient called and wanted to let us know that she did not start taking her Ibrance until 9/14 because she was having some GI issues.  [place] Patient [facility] Phone [phone] Fax [phone] 12/30/2018   11:21 AM

## 2019-01-07 ENCOUNTER — Telehealth: Payer: Self-pay

## 2019-01-07 NOTE — Telephone Encounter (Signed)
Patient called and left a voicemail saying she was having break through vomiting: 1 episode daily. She said she wasn't feeling sick and still having bowel movements, so she wasn't sure what the cause was. She was calling to see if there was another prescription she should try or if she needed to be seen by a provider.   Called patient back to get more information on the issue, and was informed that the episodes usually occurred later afternoon/early evening. She denied any stomach or abdominal pain, and stated she was having regular bowel movements. She said she was still taking her Zofran and Compazine prescriptions to try and manage the daily episodes, and overall felt well. Asked patient if she would like to come in for IVF, but she declined saying she was still able to eat a drink regularly and didn't feel it was necessary.   Informed patient that after discussing her symptoms with Dr. Burr Medico, she thought it could possibly be related to her Ibrance medication (though it is a less common side effect). Told her Dr. Burr Medico was willing to see her sooner than her scheduled follow up appointment on 01/14/19 if she wanted. Patient stated she felt like she could wait until the 1st of October, but would call if her symptoms worsened or new ones arose. Denied any further needs or concerns at this time.

## 2019-01-12 ENCOUNTER — Other Ambulatory Visit: Payer: Self-pay | Admitting: Hematology

## 2019-01-12 ENCOUNTER — Other Ambulatory Visit: Payer: Self-pay

## 2019-01-12 ENCOUNTER — Telehealth: Payer: Self-pay

## 2019-01-12 DIAGNOSIS — R110 Nausea: Secondary | ICD-10-CM

## 2019-01-12 DIAGNOSIS — C7951 Secondary malignant neoplasm of bone: Secondary | ICD-10-CM

## 2019-01-12 MED ORDER — PROCHLORPERAZINE MALEATE 10 MG PO TABS: 10.0000 mg | ORAL_TABLET | Freq: Four times a day (QID) | ORAL | 1 refills | Status: DC | PRN

## 2019-01-12 MED ORDER — OXYCODONE HCL 10 MG PO TABS: 10.0000 mg | ORAL_TABLET | Freq: Three times a day (TID) | ORAL | 0 refills | Status: DC | PRN

## 2019-01-12 NOTE — Telephone Encounter (Signed)
Patient left a voicemail asking if it was possible the injection and infusion (Zoladex and Zometa) she received during her last appointment could have caused her to have worsening nausea. If so, she wanted to know if they could be separated for future appointments. She also requested a refill for her Compazine and Oxycodone prescriptions. Updated Dr. Burr Medico who stated it was difficult to say for sure, but the nausea could be related to the 2 medicines, and that it was fine for those appointments to be separated in the future.   Returned patient's call to update her on above information, and let her know that the prescriptions she requested would be refilled. Reminded her of her upcoming appointments this Thursday, and that if she had any other issues/concerns Dr. Burr Medico would address them during her visit. Patient verbalized understanding and agreement, and denied any further needs at this time.

## 2019-01-12 NOTE — Progress Notes (Signed)
Caitlin Cohen   [phone] [phone]   Clinic Follow up Note   Patient Care Team: Patient, No Pcp Per as PCP - General ([place]) Truitt Merle, MD as Consulting Physician (Hematology) [name], MD as Consulting Physician (General Surgery) [name], MD as Attending Physician (Radiation Oncology) [name], NP as Nurse Practitioner (Nurse Practitioner)  Date of Service:  01/14/2019  CHIEF COMPLAINT: Follow upof metastaticright breastcancer  SUMMARY OF ONCOLOGIC HISTORY: Oncology History Overview Note  Cancer Staging Malignant neoplasm of upper-inner quadrant of right breast in female, estrogen receptor positive ([place]) Staging form: Breast, AJCC 8th Edition - Clinical stage from 06/26/2017: Stage IIA (cT2, cN0, cM0, G3, ER: Positive, PR: Positive, HER2: Negative) - Signed by Truitt Merle, MD on 07/01/2017 - Pathologic stage from 07/08/2017: Stage IB (pT2, pN0, cM0, G3, ER+, PR+, HER2-, Oncotype DX score: 29) - Signed by [name], NP on 07/25/2017     Malignant neoplasm of upper-inner quadrant of right breast in female, estrogen receptor positive ([place])  06/23/2017 Mammogram   IMPRESSION: 1. There is a highly suspicious mass measuring 3.5 cm mammographically in the right breast at the palpable site identified by the patient. 2. There is 1 suspicious lymph node with cortex measuring up to 6 mm. 3.  No mammographic evidence of malignancy in the left breast.   06/26/2017 Initial Biopsy   Diagnosis 06/26/17 1. Breast, right, needle core biopsy, upper inner quadrant, 12:30 o'clock, 7cm from nipple - INVASIVE DUCTAL CARCINOMA - SEE COMMENT 2. Lymph node, needle/core biopsy, right axillary (level 2 node) - NO CARCINOMA IDENTIFIED IN ONE LYMPH NODE (0/1)   06/26/2017 Receptors her2   Prognostic indicators significant for: ER, 70% positive and PR, 70% positive, both with strong staining intensity. Proliferation marker Ki67 at  60%. HER2 negative.   07/01/2017 Initial Diagnosis   Malignant neoplasm of upper-inner quadrant of right breast in female, estrogen receptor positive ([place])   07/08/2017 Pathology Results   Diagnosis 1. Breast, lumpectomy, Right - INVASIVE DUCTAL CARCINOMA, NOTTINGHAM GRADE 3 OF 3, 3.5 CM - MARGINS UNINVOLVED BY CARCINOMA (0.1 CM, SUPERIOR MARGIN) - PREVIOUS BIOPSY SITE CHANGES - SEE ONCOLOGY TABLE BELOW 2. Soft tissue, biopsy, Axillary - BENIGN FIBROADIPOSE TISSUE - NO MALIGNANCY IDENTIFIED 3. Lymph node, sentinel, biopsy, Right axillary - NO CARCINOMA IDENTIFIED IN ONE LYMPH NODE (0/1) 4. Lymph node, sentinel, biopsy, Right axillary - NO CARCINOMA IDENTIFIED IN ONE LYMPH NODE (0/1) - PREVIOUS BIOPSY SITE CHANGES - SEE COMMENT 5. Breast, excision, Right superior margin - BENIGN BREAST TISSUE - NO RESIDUAL CARCINOMA IDENTIFIED 6. Breast, excision, Right inferior margin - BENIGN BREAST TISSUE - NO RESIDUAL CARCINOMA IDENTIFIED   07/08/2017 Cancer Staging   Staging form: Breast, AJCC 8th Edition - Pathologic stage from 07/08/2017: Stage IB (pT2, pN0, cM0, G3, ER+, PR+, HER2-, Oncotype DX score: 29) - Signed by [name], NP on 07/25/2017   07/22/2017 Oncotype testing   Recurrence Score: 29 Distant Recurrence Risk at 9 years with Ai or Tamoxifen alone: 18% Absolute Chemotherapy Benefit: > 15 %   08/08/2017 -  Chemotherapy   Adjuvant chemo Taxotere/Cytoxanq3 weeks x4 cycles with Udenyca -Granix added from cycle 2. Discontinued Udenyca following cycle 2.    11/10/2017 - 12/08/2017 Radiation Therapy   Radiation treatment dates:   11/10/2017 to 12/08/2017  Site/dose:    1. The Right breast was treated to 40.05 Gy in 15 fractions of 2.67 Gy. 2. The Right breast was boosted to 10 Gy in  5 fractions of 2 Gy.  Beams/energy:    1. 3D// 10X 2. Isodose plan w/photons // 10X, 6X  Narrative: The patient tolerated radiation treatment relatively well.   She developed moderate  hyperpigmentation to the right breast with treatment. She denies any pain or fatigue. She continues to use radiaplex twice daily as directed.    12/2017 - 11/2018 Anti-estrogen oral therapy   started Tamoxifen 90m daily in 12/2017. Stopped in 11/2018 due to cancer recurrance. Restarted in early 12/2018 and switched to Letrozole on 01/14/19.    08/11/2018 Survivorship   SCP virtual visit per LCira Rue NP    10/27/2018 Imaging   CT CAP W Contrast 10/27/18  IMPRESSION: 1. Multifocal lytic bone metastases are identified involving the left ribs, L2 vertebra, and right iliac wing. There is of pathologic fracture involving the L2 vertebra. 2. Single small, nonspecific pulmonary nodule in the right upper lobe is noted. 3. Ill-defined low-density structure in right lobe of liver measures 1.3 cm and is indeterminate. This may be better characterized with contrast enhanced liver MRI or PET-CT.   10/28/2018 Imaging   Whole Body Bone Scan 10/28/18  IMPRESSION: Sites of uptake in the left third and sixth ribs, right iliac crest and L2 vertebral body are compatible with sites of osseous metastatic disease with lesions visualized on CT from 1 day prior   Non-specific soft tissue uptake is seen at the site of prior right lumpectomy.   Likely urinary contamination in the soft tissues of the right pelvis, buttock and right flank.   11/12/2018 Relapse/Recurrence   Diagnosis 1. Bone, biopsy, Lumbar 2, Left Pedicle - BONE WITH MARROW FIBROSIS. - NO MALIGNANCY IDENTIFIED. 2. Bone, biopsy, Lumbar 2, Right Pedicle - METASTATIC CARCINOMA, SEE COMMENT.  The tumor cells are NEGATIVE for Her2 (0). Estrogen Receptor: 0%, NEGATIVE Progesterone Receptor: 0%, NEGATIVE   12/02/2018 - 12/18/2018 Radiation Therapy   L2 palliative RT (12/02/18-12/09/18) and will start palliative RT to right iliac lesion 12/10/18 and completed on 12/18/18 with Dr. SIsidore Moos   12/03/2018 Pathology Results   Diagnosis 12/03/18 Bone, biopsy,  iliac crest, right - METASTATIC CARCINOMA. Results: IMMUNOHISTOCHEMICAL AND MORPHOMETRIC ANALYSIS PERFORMED MANUALLY The tumor cells are NEGATIVE for Her2 (1+). Estrogen Receptor: 80%, POSITIVE, STRONG STAINING INTENSITY Progesterone Receptor: 20%, POSITIVE, STRONG STAINING INTENSITY   12/16/2018 -  Chemotherapy   Zometa injection q355monthstarting 12/16/18    12/28/2018 -  Chemotherapy   Ibrance 3 weeks on/1 week off starting 12/28/18 Letrozole once daily starting 01/14/19 with monthly Zoledex injections starting 12/16/18.       CURRENT THERAPY:  -Zometa infusion q3m55monthtarting 12/16/18 -Monthly Zoladex injections starting 12/16/18  -Restart tamoxifen in 1 week. Plan to switch with Letrozole with 2nd Zoladex injection 01/14/19.  -Pending Ibrance 3 weeks on/1 week off starting 12/28/18.   INTERVAL HISTORY:  Caitlin Cohen here for a follow up. She presents to the clinic alone. She notes she completed all her courses of Radiation on 12/18/18. She notes radiation help the pain but still has it now at 4-5/10 in her right hip. She notes her mid back pain improved but now has back pain lower and to the left of original site. She notes her pain is constant. She is able to have normal urination with no dysuria. She walks with mild difficulty but overall better. She still ambulates with cane as some days she can stands longer than others. She notes she starting Ibrance on the 9/14. She is on her 3rd week.  She notes she had nausea before Ibrance which she vomited once a week at that point. She notes last week she vomited daily which then subsided. She vomited for the firs time today after a few days. She notes she makes herself eat and drink and keep it down. Her weight has remind stable. She notes her BMs is better. She does not go everyday. She does take Miralax as needed if not gone in 2-3 days.  She feels with her Zometa she has chills and flu like symptoms. That lasts for 3-4 days. She has been  taking 2-3 Oxycodone a day and MC Contin BID.    REVIEW OF SYSTEMS:   Constitutional: Denies fevers, chills or abnormal weight loss Eyes: Denies blurriness of vision Ears, nose, mouth, throat, and face: Denies mucositis or sore throat Respiratory: Denies cough, dyspnea or wheezes Cardiovascular: Denies palpitation, chest discomfort or lower extremity swelling Gastrointestinal:  Denies heartburn (+) Mild constipation (+) N&V MSK:(+) Improved right hip pain (+) improved back pain withn new left lumbar back pain Skin: Denies abnormal skin rashes Lymphatics: Denies new lymphadenopathy or easy bruising Neurological:Denies numbness, tingling or new weaknesses Behavioral/Psych: Mood is stable, no new changes  All other systems were reviewed with the patient and are negative.  MEDICAL HISTORY:  Past Medical History:  Diagnosis Date  . Anemia   . Anxiety   . Cancer ([place]) dx'd 06/2017   breast cancer right  . Depression   . Diabetes mellitus without complication ([place])   . GERD (gastroesophageal reflux disease)   . Headache   . History of radiation therapy 11/10/17- 12/08/17   right breast, 40.05 Gy in 15 fractions. Right breast boost 10 Gy in 5 fractions.   . Personal history of chemotherapy   . Personal history of radiation therapy     SURGICAL HISTORY: Past Surgical History:  Procedure Laterality Date  . CERVICAL CONE BIOPSY    . CHOLECYSTECTOMY    . IR RADIOLOGIST EVAL & MGMT  11/03/2018  . KYPHOPLASTY N/A 11/12/2018   Procedure: LUMBAR TWO  BIOPSY AND LUMBAR TWO KYPHOPLASTY;  Surgeon: Melina Schools, MD;  Location: [facility];  Service: Orthopedics;  Laterality: N/A;  90 mins  . MASTECTOMY WITH RADIOACTIVE SEED GUIDED EXCISION AND AXILLARY SENTINEL LYMPH NODE BIOPSY Right 07/08/2017   Procedure: RIGHT LUMPTECTOMY WITH RADIOACTIVE SEED GUIDED EXCISION AND RIGHT AXILLARY SENTINEL LYMPH NODE BIOPSY ERAS PATHWAY;  Surgeon: [name], MD;  Location: [facility];  Service: General;   Laterality: Right;  PEC BLOCK  . OPEN REDUCTION INTERNAL FIXATION (ORIF) DISTAL RADIAL FRACTURE  03/17/2012   Procedure: OPEN REDUCTION INTERNAL FIXATION (ORIF) DISTAL RADIAL FRACTURE;  Surgeon: Roseanne Kaufman, MD;  Location: [facility];  Service: Orthopedics;  Laterality: Right;  Pre-operative a supra-clavical block right arm in addition to general anesthesia  . PORT-A-CATH REMOVAL Right 02/02/2018   Procedure: REMOVAL PORT-A-CATH;  Surgeon: [name], MD;  Location: [facility];  Service: General;  Laterality: Right;  . PORTACATH PLACEMENT N/A 07/30/2017   Procedure: INSERTION PORT-A-CATH WITH [place];  Surgeon: [name], MD;  Location: [facility];  Service: General;  Laterality: N/A;  . TUBAL LIGATION      I have reviewed the social history and family history with the patient and they are unchanged from previous note.  ALLERGIES:  has No Known Allergies.  MEDICATIONS:  Current Outpatient Medications  Medication Sig Dispense Refill  . Ca & Phos-Vit D-Mag [phone] TABS Take 1 tablet by mouth daily. 20 tablet 0  .  gabapentin (NEURONTIN) 100 MG capsule Take 1 capsule in the morning and 3 capsules at bedtime 360 capsule 1  . gabapentin (NEURONTIN) 300 MG capsule Take 1 capsule (300 mg total) by mouth 3 (three) times daily. 30 capsule 3  . IBRANCE 125 MG tablet TAKE 1 TABLET (125 MG TOTAL) BY MOUTH DAILY. TAKE FOR 21 DAYS ON, 7 DAYS OFF, REPEAT EVERY 28 DAYS. 21 tablet 0  . metFORMIN (GLUCOPHAGE) 500 MG tablet Take 1 tablet (500 mg total) by mouth 2 (two) times daily with a meal. 180 tablet 1  . morphine (MS CONTIN) 30 MG 12 hr tablet Take 1 tablet (30 mg total) by mouth every 12 (twelve) hours. 60 tablet 0  . ondansetron (ZOFRAN) 8 MG tablet Take 1 tablet (8 mg total) by mouth every 8 (eight) hours as needed for nausea or vomiting. 30 tablet 3  . Oxycodone HCl 10 MG TABS Take 1 tablet (10 mg total) by mouth every 8 (eight) hours as needed. 60 tablet 0  . polyethylene  glycol (MIRALAX) 17 g packet Take 17 g by mouth daily as needed. (Patient taking differently: Take 17 g by mouth daily as needed for mild constipation. ) 14 each 8  . prochlorperazine (COMPAZINE) 10 MG tablet Take 1 tablet (10 mg total) by mouth every 6 (six) hours as needed for nausea or vomiting. 30 tablet 1  . letrozole (FEMARA) 2.5 MG tablet Take 1 tablet (2.5 mg total) by mouth daily. 30 tablet 4  . potassium chloride SA (KLOR-CON) 20 MEQ tablet Take 1 tablet (20 mEq total) by mouth 2 (two) times daily. 10 tablet 0   No current facility-administered medications for this visit.     PHYSICAL EXAMINATION: ECOG PERFORMANCE STATUS: 2 - Symptomatic, <50% confined to bed  Vitals:   01/14/19 0813  BP: 113/81  Pulse: (!) 114  Resp: 18  Temp: 98 F (36.7 C)  SpO2: 96%   Filed Weights   01/14/19 0813  Weight: 284 lb 3.2 oz (128.9 kg)    GENERAL:alert, no distress and comfortable SKIN: skin color, texture, turgor are normal, no rashes or significant lesions EYES: normal, Conjunctiva are pink and non-injected, sclera clear  NECK: supple, thyroid normal size, non-tender, without nodularity LYMPH:  no palpable lymphadenopathy in the cervical, axillary  LUNGS: clear to auscultation and percussion with normal breathing effort HEART: regular rate & rhythm and no murmurs and no lower extremity edema ABDOMEN:abdomen soft, non-tender and normal bowel sounds Musculoskeletal:no cyanosis of digits and no clubbing  NEURO: alert & oriented x 3 with fluent speech, no focal motor/sensory deficits  LABORATORY DATA:  I have reviewed the data as listed CBC Latest Ref Rng & Units 01/14/2019 12/22/2018 12/09/2018  WBC 4.0 - 10.5 K/uL 2.0(L) 5.2 6.5  Hemoglobin 12.0 - 15.0 g/dL 12.5 12.7 12.8  Hematocrit 36.0 - 46.0 % 37.9 40.1 38.8  Platelets 150 - 400 K/uL 156 222 253     CMP Latest Ref Rng & Units 01/14/2019 12/22/2018 12/22/2018  Glucose 70 - 99 mg/dL 179(H) 111(H) -  BUN 6 - 20 mg/dL 12 10 -   Creatinine 0.44 - 1.00 mg/dL 0.84 0.69 -  Sodium 135 - 145 mmol/L 141 140 -  Potassium 3.5 - 5.1 mmol/L 3.4(L) 3.6 -  Chloride 98 - 111 mmol/L 106 109 -  CO2 22 - 32 mmol/L 24 23 -  Calcium 8.9 - 10.3 mg/dL 8.5(L) 6.9(LL) 7.2(L)  Total Protein 6.5 - 8.1 g/dL 7.1 7.2 -  Total Bilirubin 0.3 -  1.2 mg/dL 0.4 0.6 -  Alkaline Phos 38 - 126 U/L 59 57 -  AST 15 - 41 U/L 20 25 -  ALT 0 - 44 U/L 15 16 -      RADIOGRAPHIC STUDIES: I have personally reviewed the radiological images as listed and agreed with the findings in the report. No results found.   ASSESSMENT & PLAN:  Naylea Wigington is a 53 y.o. female with   1. 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 She is s/p right breastlumpectomy, adjuvant chemo TC and adjuvant radiation and was treated with Tamoxifen since 12/2017. Due to disease progression, she stopped on 01/14/19.  -Her 10/2018 CT CAP/bone scan showed bone lesions highly concerning for bone metastasis.  She also has an indeterminate 1.3 cm liver lesion.  -She underwent L2 kyphoplasty for compression fracture by Dr. Rolena Infante, bone biopsy showed triple negative breast cancer.  -I repeated bone biopsy from the right iliac lesion, which showed ER and PR positive HER-2 negative right-sided breast cancer.  I believe the first bone biopsy ER and PR was falsely negative due to the decalcification.  -Given she has metastasis, her cancer is stage IV and no longer curable. Her cancer is still treatable to control her disease.  -She has completed palliative RT with Dr. Isidore Moos on 12/18/18.  -Given her ER/PR positive metastasis, I recommend antiestrogen therapy with aromatase inhibitor Letrozole daily and oral CD4K inhibitor Ibrance 3 weeks on/1 week off. She started [place] on 12/28/18.  -Given she is still  perimenopausal, she started monthly Zoladex injections on 12/16/18 to go with her Letrozole which she will start 01/14/19. She will consider BSO down the road.  -I would recommend monthly Fulvestrant injections for second line treatment.  -She notes she has chills and flu like symptoms on the day she started Zoladex and Zometa. I discussed these symptoms are more aligned with Zometa. I discussed switching her to Xgeva injections in 2 months. She is agreeable.  -She has tolerated first cycle Ibrance well. She initially had nausea before start and this has slightly increased recently. Will monitor and she will continue prophylactic antiemetics.  -She known back and right hip pain improved but now has new left lumbar pain. Physical exam today unremarkable. Labs reviewed, CBC and CMP WNL except WBC 2, ANC 1.4, K 3.4, BG 179, CA 8.5, albumin 3.4. Overall adequate to continue treatment.  -Will proceed with Zoladex today and will start Letrozole today. Will continue Ibrance, plan to start C2 on 10/12. Next Zometa in 03/2019.  -I reviewed side effects of Letrozole included hot flashes and more joint pain. If not tolerable I can switch to another AI.  -Plan to scan her after cycle 2 Ibrance.  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 -Her 09/2018 MRI spine was concerning for bone metastasisand L2 spine fracture, likely pathologic. -CT CAP from 10/27/18 and bone scan from 7/15/20whichshowuptake in left 3rd and 6th ribs, right iliac crest andlarge 4.9cm lytic lesion inL2 with pathologic fracture. -She underwent L2 Kyphoplastyand biopsy by Dr. Georgiann Mohs 11/12/18. -Her 12/03/18 bone biopsy without decalcification showed her metastasis to be ER/PE positive and her2 negative.  -Her back pain has  improved after kyphoplasty. She ambulates with  cane. I previously suggested she request PT referral from her surgeon. -She completed L2 palliative RT (12/02/18-12/09/18) and palliative RI to right iliac lesion (12/10/18-12/18/18) with Dr. Isidore Moos.  -Continue calcium 1000 units daily  -She started Zometa q85month on 12/16/18. She had flu like symptoms, likely related with her. Will switch to [place]  -I previously called in Gabapentin 3080mto help her pain.  -Her right hip and lumbar pain has improved after Radiation. She has new left lumbar back pain. Her pain is currently manageable with MS Contin 3042mID and Oxycodone 11m23m3 times a day.   3. N&V, Mild Constipation  -She notes she had nausea and once a week vomiting before start of current regimen. Last week this progressed to vomiting daily. Has slowed down again this week.  -Will continue to monitor on treatment. For Nausea she will continue Zofran and Compazine as needed. -She was able to eat and drink enough to maintain her weight.   -She does not have BM daily. She takes Miralax if no BM in 2-3 days. I encouraged her to continue.   4. Depression/Anxiety  -She has a hx of depression and was previously treated with medication. Not currently on medication and she feels that her depression is controlled. -due to herprobable metastatic disease, he is overwhelmed and crying during hervisit, she has good supportfrom her sister and children -Ipreviouslyoffered her the chance to talk to our SW about her current state. She declined for now.  5. Mild neutropenia -secondary to Ibrance, mild, will continue monitoring   6. Goal of care discussion  -We again discussed the incurable nature of her cancer, and the overall poor prognosis, especially if she does not have good response to chemotherapy or progress on chemo -The patient understands the goal of care is palliative. -she is full code now    Plan -Labs reviewed and adequate to proceed with Zoladex injection today  -Continue  Ibrance. Cycle 2 start 10/15 -I called in Letrozole for her to start this week, she can stop Tamoxifen  -change zometa to Xgeva, first dose in 2 months  -Lab and f/u on 10/15   No problem-specific Assessment & Plan notes found for this encounter.   No orders of the defined types were placed in this encounter.  All questions were answered. The patient knows to call the clinic with any problems, questions or concerns. No barriers to learning was detected. I spent 20 minutes counseling the patient face to face. The total time spent in the appointment was 25 minutes and more than 50% was on counseling and review of test results     Torres Hardenbrook Truitt Merle 01/14/2019   I, AmoyJoslyn Devon acting as scribe for Krisandra Bueno Truitt Merle.   I have reviewed the above documentation for accuracy and completeness, and I agree with the above.

## 2019-01-13 ENCOUNTER — Other Ambulatory Visit: Payer: Self-pay | Admitting: Hematology

## 2019-01-14 ENCOUNTER — Inpatient Hospital Stay: Payer: Medicaid Other

## 2019-01-14 ENCOUNTER — Encounter: Payer: Self-pay | Admitting: Hematology

## 2019-01-14 ENCOUNTER — Other Ambulatory Visit: Payer: Self-pay | Admitting: Hematology

## 2019-01-14 ENCOUNTER — Inpatient Hospital Stay: Payer: Medicaid Other | Attending: Hematology

## 2019-01-14 ENCOUNTER — Telehealth: Payer: Self-pay | Admitting: Radiation Oncology

## 2019-01-14 ENCOUNTER — Other Ambulatory Visit: Payer: Self-pay

## 2019-01-14 ENCOUNTER — Telehealth: Payer: Self-pay | Admitting: Hematology

## 2019-01-14 ENCOUNTER — Inpatient Hospital Stay (HOSPITAL_BASED_OUTPATIENT_CLINIC_OR_DEPARTMENT_OTHER): Payer: Medicaid Other | Admitting: Hematology

## 2019-01-14 VITALS — BP 113/81 | HR 114 | Temp 98.0°F | Resp 18 | Ht 67.0 in | Wt 284.2 lb

## 2019-01-14 DIAGNOSIS — Z170 Estrogen receptor positive status [ER+]: Secondary | ICD-10-CM | POA: Insufficient documentation

## 2019-01-14 DIAGNOSIS — K5900 Constipation, unspecified: Secondary | ICD-10-CM | POA: Insufficient documentation

## 2019-01-14 DIAGNOSIS — R110 Nausea: Secondary | ICD-10-CM | POA: Insufficient documentation

## 2019-01-14 DIAGNOSIS — C50211 Malignant neoplasm of upper-inner quadrant of right female breast: Secondary | ICD-10-CM | POA: Diagnosis not present

## 2019-01-14 DIAGNOSIS — Z95828 Presence of other vascular implants and grafts: Secondary | ICD-10-CM

## 2019-01-14 DIAGNOSIS — R5383 Other fatigue: Secondary | ICD-10-CM | POA: Diagnosis not present

## 2019-01-14 DIAGNOSIS — Z923 Personal history of irradiation: Secondary | ICD-10-CM | POA: Diagnosis not present

## 2019-01-14 DIAGNOSIS — Z9221 Personal history of antineoplastic chemotherapy: Secondary | ICD-10-CM | POA: Diagnosis not present

## 2019-01-14 DIAGNOSIS — F329 Major depressive disorder, single episode, unspecified: Secondary | ICD-10-CM | POA: Insufficient documentation

## 2019-01-14 DIAGNOSIS — F419 Anxiety disorder, unspecified: Secondary | ICD-10-CM | POA: Diagnosis not present

## 2019-01-14 DIAGNOSIS — Z9011 Acquired absence of right breast and nipple: Secondary | ICD-10-CM | POA: Diagnosis not present

## 2019-01-14 DIAGNOSIS — E119 Type 2 diabetes mellitus without complications: Secondary | ICD-10-CM | POA: Insufficient documentation

## 2019-01-14 DIAGNOSIS — Z5111 Encounter for antineoplastic chemotherapy: Secondary | ICD-10-CM | POA: Diagnosis present

## 2019-01-14 DIAGNOSIS — C7951 Secondary malignant neoplasm of bone: Secondary | ICD-10-CM

## 2019-01-14 LAB — CMP (CANCER CENTER ONLY)
ALT: 15 U/L (ref 0–44)
AST: 20 U/L (ref 15–41)
Albumin: 3.4 g/dL — ABNORMAL LOW (ref 3.5–5.0)
Alkaline Phosphatase: 59 U/L (ref 38–126)
Anion gap: 11 (ref 5–15)
BUN: 12 mg/dL (ref 6–20)
CO2: 24 mmol/L (ref 22–32)
Calcium: 8.5 mg/dL — ABNORMAL LOW (ref 8.9–10.3)
Chloride: 106 mmol/L (ref 98–111)
Creatinine: 0.84 mg/dL (ref 0.44–1.00)
GFR, Est AFR Am: >60 mL/min (ref >60)
GFR, Estimated: >60 mL/min (ref >60)
Glucose, Bld: 179 mg/dL — ABNORMAL HIGH (ref 70–99)
Potassium: 3.4 mmol/L — ABNORMAL LOW (ref 3.5–5.1)
Sodium: 141 mmol/L (ref 135–145)
Total Bilirubin: 0.4 mg/dL (ref 0.3–1.2)
Total Protein: 7.1 g/dL (ref 6.5–8.1)

## 2019-01-14 LAB — CBC WITH DIFFERENTIAL (CANCER CENTER ONLY)
Abs Immature Granulocytes: 0 10*3/uL (ref 0.00–0.07)
Basophils Absolute: 0 10*3/uL (ref 0.0–0.1)
Basophils Relative: 1 %
Eosinophils Absolute: 0 10*3/uL (ref 0.0–0.5)
Eosinophils Relative: 1 %
HCT: 37.9 % (ref 36.0–46.0)
Hemoglobin: 12.5 g/dL (ref 12.0–15.0)
Immature Granulocytes: 0 %
Lymphocytes Relative: 26 %
Lymphs Abs: 0.5 10*3/uL — ABNORMAL LOW (ref 0.7–4.0)
MCH: 28.2 pg (ref 26.0–34.0)
MCHC: 33 g/dL (ref 30.0–36.0)
MCV: 85.6 fL (ref 80.0–100.0)
Monocytes Absolute: 0.1 10*3/uL (ref 0.1–1.0)
Monocytes Relative: 5 %
Neutro Abs: 1.4 10*3/uL — ABNORMAL LOW (ref 1.7–7.7)
Neutrophils Relative %: 67 %
Platelet Count: 156 10*3/uL (ref 150–400)
RBC: 4.43 MIL/uL (ref 3.87–5.11)
RDW: 14.7 % (ref 11.5–15.5)
WBC Count: 2 10*3/uL — ABNORMAL LOW (ref 4.0–10.5)
nRBC: 0 % (ref 0.0–0.2)

## 2019-01-14 MED ORDER — LETROZOLE 2.5 MG PO TABS: 2.5000 mg | ORAL_TABLET | Freq: Every day | ORAL | 4 refills | Status: DC

## 2019-01-14 MED ORDER — MORPHINE SULFATE ER 30 MG PO TBCR: 30.0000 mg | EXTENDED_RELEASE_TABLET | Freq: Two times a day (BID) | ORAL | 0 refills | Status: DC

## 2019-01-14 MED ORDER — GOSERELIN ACETATE 3.6 MG ~~LOC~~ IMPL
3.6000 mg | DRUG_IMPLANT | Freq: Once | SUBCUTANEOUS | Status: AC
  Administered 2019-01-14: 10:00:00 3.6 mg via SUBCUTANEOUS

## 2019-01-14 MED ORDER — GOSERELIN ACETATE 3.6 MG ~~LOC~~ IMPL
DRUG_IMPLANT | SUBCUTANEOUS | Status: AC
  Filled 2019-01-14: qty 3.6

## 2019-01-14 MED ORDER — POTASSIUM CHLORIDE CRYS ER 20 MEQ PO TBCR: 20.0000 meq | EXTENDED_RELEASE_TABLET | Freq: Two times a day (BID) | ORAL | 0 refills | Status: DC

## 2019-01-14 NOTE — Telephone Encounter (Signed)
Scheduled appt per 10/1 los with the patient over the phone.  Pt aware of her appt date and time,

## 2019-01-14 NOTE — Patient Instructions (Signed)

## 2019-01-14 NOTE — Telephone Encounter (Signed)
Contacted patient to verify telephone visit for pre reg °

## 2019-01-15 ENCOUNTER — Ambulatory Visit
Admission: RE | Admit: 2019-01-15 | Discharge: 2019-01-15 | Disposition: A | Discharge status: Home / Self Care | Payer: Medicaid Other | Source: Ambulatory Visit | Attending: Radiation Oncology | Admitting: Radiation Oncology

## 2019-01-15 ENCOUNTER — Telehealth: Payer: Self-pay

## 2019-01-15 ENCOUNTER — Encounter: Payer: Self-pay | Admitting: Radiation Oncology

## 2019-01-15 DIAGNOSIS — C7951 Secondary malignant neoplasm of bone: Secondary | ICD-10-CM

## 2019-01-15 DIAGNOSIS — C50211 Malignant neoplasm of upper-inner quadrant of right female breast: Secondary | ICD-10-CM

## 2019-01-15 LAB — CANCER ANTIGEN 27.29: CA 27.29: 31.7 U/mL (ref 0.0–38.6)

## 2019-01-15 NOTE — Telephone Encounter (Signed)
Received TC from patient stating that she needed to speak wit Cecille Rubin about her short term disability papers. She needs to make some changes to the document. I let her know that Cecille Rubin is not in today but that I would be sure to get this message to her. Let message on Lori's desk for her to call back patient Monday.

## 2019-01-15 NOTE — Progress Notes (Signed)
Radiation Oncology         [phone] ________________________________  Name: Caitlin Cohen MRN: [id number]  Date: 01/15/2019  DOB: 05-Mar-1966  Follow-Up Visit Note by phone due to pandemic precautions.     CC: Patient, No Pcp Per  Truitt Merle, MD  Diagnosis and Prior Radiotherapy:    ICD-10-CM   1. Malignant neoplasm of upper-inner quadrant of right breast in female, estrogen receptor positive ([place])  C50.211    Z17.0   2. Bone metastases ([place])  C79.51     CHIEF COMPLAINT: Here for follow-up for bone metastases  Narrative:  She is doing well overall, 1 month post palliative RT. Reports Left back pain (new) - to be explored with upcoming scan. "It is not major."                           Rib pain, lumbar, iliac pain (at sites of recent RT) all much better.  ALLERGIES:  has No Known Allergies.  Meds: Current Outpatient Medications  Medication Sig Dispense Refill  . Ca & Phos-Vit D-Mag [phone] TABS Take 1 tablet by mouth daily. 20 tablet 0  . gabapentin (NEURONTIN) 100 MG capsule Take 1 capsule in the morning and 3 capsules at bedtime 360 capsule 1  . gabapentin (NEURONTIN) 300 MG capsule Take 1 capsule (300 mg total) by mouth 3 (three) times daily. 30 capsule 3  . IBRANCE 125 MG tablet TAKE 1 TABLET (125 MG TOTAL) BY MOUTH DAILY. TAKE FOR 21 DAYS ON, 7 DAYS OFF, REPEAT EVERY 28 DAYS. 21 tablet 0  . letrozole (FEMARA) 2.5 MG tablet Take 1 tablet (2.5 mg total) by mouth daily. 30 tablet 4  . metFORMIN (GLUCOPHAGE) 500 MG tablet Take 1 tablet (500 mg total) by mouth 2 (two) times daily with a meal. 180 tablet 1  . morphine (MS CONTIN) 30 MG 12 hr tablet Take 1 tablet (30 mg total) by mouth every 12 (twelve) hours. 60 tablet 0  . ondansetron (ZOFRAN) 8 MG tablet Take 1 tablet (8 mg total) by mouth every 8 (eight) hours as needed for nausea or vomiting. 30 tablet 3  . Oxycodone HCl 10 MG TABS Take 1 tablet (10 mg total) by mouth every 8 (eight) hours as needed. 60 tablet 0   . polyethylene glycol (MIRALAX) 17 g packet Take 17 g by mouth daily as needed. (Patient taking differently: Take 17 g by mouth daily as needed for mild constipation. ) 14 each 8  . potassium chloride SA (KLOR-CON) 20 MEQ tablet Take 1 tablet (20 mEq total) by mouth 2 (two) times daily. 10 tablet 0  . prochlorperazine (COMPAZINE) 10 MG tablet Take 1 tablet (10 mg total) by mouth every 6 (six) hours as needed for nausea or vomiting. 30 tablet 1   No current facility-administered medications for this encounter.     Physical Findings: The patient is in no acute distress. Patient is alert and oriented.  vitals were not taken for this visit. .      Lab Findings: Lab Results  Component Value Date   WBC 2.0 (L) 01/14/2019   HGB 12.5 01/14/2019   HCT 37.9 01/14/2019   MCV 85.6 01/14/2019   PLT 156 01/14/2019    Radiographic Findings: No results found.  Impression/Plan:  Good symptomatic response to RT  Followup PRN, continue following with medical oncology. She is pleased with this plan.  We discussed measures to reduce the risk of infection during  the COVID-19 pandemic.    This encounter was provided by telemedicine platform by phone due to pandemic precautions - unable to access WebEx.  The patient has given verbal consent for this type of encounter and has been advised to only accept a meeting of this type in a secure network environment. The time spent during this encounter was over 5 minutes. The attendants for this meeting include [name]  and Miguel Aschoff.  During the encounter, [name] was located at [facility] Radiation On[facility].  Ondine Francis was located at home.  _____________________________________   [name], MD

## 2019-01-18 ENCOUNTER — Telehealth: Payer: Self-pay

## 2019-01-18 NOTE — Telephone Encounter (Signed)
I called her back. She wakes up a few times at night, but feels she is not getting enough sleep (5-6 hours together). She is on short and long acting narcotics, and taking Neurontin 400 mg at night, I did not feel safe to add Ambien or benzo.  I recommend her to try melatonin at night, and can use Tylenol PM or Benadryl as needed for insomnia. She agrees with the plan, and will try. She appreciated the call.   Truitt Merle MD

## 2019-01-18 NOTE — Telephone Encounter (Signed)
Spoke with patient to inform okay for her to get the flu shot.  She verbalized an understanding.   She wanted to let Dr. Burr Medico she is not sleeping well at night despite taking her pain medications and the Gabapentin.  She doesn't feel like the pain is waking her.  She wanted Dr. Ernestina Penna suggestions on what she should take.

## 2019-01-18 NOTE — Telephone Encounter (Signed)
-----   Message from Truitt Merle, MD sent at 01/15/2019  5:07 PM EDT ----- Thanks Judson Roch, my nurse Malachy Mood will call her on Monday.   Flu shot is safe for pts on chemo, we give to every one unless they don't want it. We usually hold on live vaccine when pt is on chemo or a few years after chemo.  Caitlin Cohen  ----- Message ----- From: [name], MD Sent: 01/15/2019   2:18 PM EDT To: Truitt Merle, MD  Fransisca Connors, can you or your nurse call patient to let her know if her immune system is adequate right now for a flu shot (what is your criteria, by the way?  I am curious so I can advise patients in the future that are undergoing chemotherapy.  Otherwise I am happy to punt them back to you.). Thanks- Judson Roch

## 2019-01-19 ENCOUNTER — Telehealth: Payer: Self-pay

## 2019-01-19 NOTE — Telephone Encounter (Signed)
Patient left a voicemail saying she was feeling more dizzy than usual, almost had a fall last night, and is experiencing more hot flashes. Updated Lacie Burton-NP who informed that the hot flashes were likely due to patient's Letrozole prescription, and that she had availability to see the patient today at 12:15 if patient wanted to come in and be evaluated.   Called patient back to get some more information about her symptoms, and see if she was interested in coming in today to see Lacie. Patient stated that she was dealing with dizziness before but it mainly seemed to be first thing in the morning when she would wake up. Now she stated it happens any time of day, and doesn't matter if she's lying down or sitting before she stands up. She stated she took Melatonin last night and it did help her sleep, but she hasn't had any Benadryl. She denied any changes in vision, heart racing, headaches, or shortness of breath. She stated she felt like she was eating and drinking well, and didn't feel dehydrated. Asked patient if she had a way to check her BP at home. She stated she didn't but would ask her daughter to pick up a BP cuff so she could check her BP throughout the day. Patient stated she would rather hold off coming in to see Lacie and try to just monitor her symptoms until her next follow-up appointment on 01/28/19.   Instructed patient to call out clinic back immediately if any of her symptoms worsen or new ones arise. Patient verbalized understanding and agreement, and denied any other needs at this time.

## 2019-01-20 MED FILL — IBRANCE 125 MG TABS: 125 | 28 days supply | Qty: 21 | Fill #0

## 2019-01-28 ENCOUNTER — Inpatient Hospital Stay (HOSPITAL_BASED_OUTPATIENT_CLINIC_OR_DEPARTMENT_OTHER): Payer: Medicaid Other | Admitting: Nurse Practitioner

## 2019-01-28 ENCOUNTER — Other Ambulatory Visit: Payer: Self-pay

## 2019-01-28 ENCOUNTER — Inpatient Hospital Stay: Payer: Medicaid Other

## 2019-01-28 ENCOUNTER — Encounter: Payer: Self-pay | Admitting: Nurse Practitioner

## 2019-01-28 VITALS — BP 118/79 | HR 98 | Temp 97.6°F | Resp 18 | Ht 67.0 in | Wt 284.1 lb

## 2019-01-28 DIAGNOSIS — D493 Neoplasm of unspecified behavior of breast: Secondary | ICD-10-CM | POA: Diagnosis not present

## 2019-01-28 DIAGNOSIS — Z170 Estrogen receptor positive status [ER+]: Secondary | ICD-10-CM | POA: Diagnosis not present

## 2019-01-28 DIAGNOSIS — C50211 Malignant neoplasm of upper-inner quadrant of right female breast: Secondary | ICD-10-CM | POA: Diagnosis not present

## 2019-01-28 DIAGNOSIS — Z5111 Encounter for antineoplastic chemotherapy: Secondary | ICD-10-CM | POA: Diagnosis not present

## 2019-01-28 DIAGNOSIS — G620 Drug-induced polyneuropathy: Secondary | ICD-10-CM

## 2019-01-28 DIAGNOSIS — T451X5A Adverse effect of antineoplastic and immunosuppressive drugs, initial encounter: Secondary | ICD-10-CM

## 2019-01-28 LAB — CBC WITH DIFFERENTIAL (CANCER CENTER ONLY)
Abs Immature Granulocytes: 0.01 10*3/uL (ref 0.00–0.07)
Basophils Absolute: 0 10*3/uL (ref 0.0–0.1)
Basophils Relative: 1 %
Eosinophils Absolute: 0 10*3/uL (ref 0.0–0.5)
Eosinophils Relative: 1 %
HCT: 36.7 % (ref 36.0–46.0)
Hemoglobin: 12.2 g/dL (ref 12.0–15.0)
Immature Granulocytes: 0 %
Lymphocytes Relative: 27 %
Lymphs Abs: 0.8 10*3/uL (ref 0.7–4.0)
MCH: 29 pg (ref 26.0–34.0)
MCHC: 33.2 g/dL (ref 30.0–36.0)
MCV: 87.2 fL (ref 80.0–100.0)
Monocytes Absolute: 0.2 10*3/uL (ref 0.1–1.0)
Monocytes Relative: 8 %
Neutro Abs: 1.9 10*3/uL (ref 1.7–7.7)
Neutrophils Relative %: 63 %
Platelet Count: 258 10*3/uL (ref 150–400)
RBC: 4.21 MIL/uL (ref 3.87–5.11)
RDW: 16.2 % — ABNORMAL HIGH (ref 11.5–15.5)
WBC Count: 3.1 10*3/uL — ABNORMAL LOW (ref 4.0–10.5)
nRBC: 0 % (ref 0.0–0.2)

## 2019-01-28 LAB — CMP (CANCER CENTER ONLY)
ALT: 16 U/L (ref 0–44)
AST: 20 U/L (ref 15–41)
Albumin: 3.1 g/dL — ABNORMAL LOW (ref 3.5–5.0)
Alkaline Phosphatase: 62 U/L (ref 38–126)
Anion gap: 9 (ref 5–15)
BUN: 11 mg/dL (ref 6–20)
CO2: 29 mmol/L (ref 22–32)
Calcium: 8.8 mg/dL — ABNORMAL LOW (ref 8.9–10.3)
Chloride: 105 mmol/L (ref 98–111)
Creatinine: 0.9 mg/dL (ref 0.44–1.00)
GFR, Est AFR Am: >60 mL/min (ref >60)
GFR, Estimated: >60 mL/min (ref >60)
Glucose, Bld: 106 mg/dL — ABNORMAL HIGH (ref 70–99)
Potassium: 3.6 mmol/L (ref 3.5–5.1)
Sodium: 143 mmol/L (ref 135–145)
Total Bilirubin: 0.2 mg/dL — ABNORMAL LOW (ref 0.3–1.2)
Total Protein: 7.1 g/dL (ref 6.5–8.1)

## 2019-01-28 MED ORDER — GABAPENTIN 100 MG PO CAPS: ORAL_CAPSULE | ORAL | 1 refills | Status: DC

## 2019-01-28 MED ORDER — OXYCODONE HCL 10 MG PO TABS: 10.0000 mg | ORAL_TABLET | Freq: Three times a day (TID) | ORAL | 0 refills | Status: DC | PRN

## 2019-01-28 NOTE — Progress Notes (Signed)
[place]   [phone] [phone]   Clinic Follow up Note   Patient Care Team: Patient, No Pcp Per as PCP - General ([place]) Truitt Merle, MD as Consulting Physician (Hematology) [name], MD as Consulting Physician (General Surgery) [name], MD as Attending Physician (Radiation Oncology) [name], NP as Nurse Practitioner (Nurse Practitioner) 01/28/2019  CHIEF COMPLAINT: F/u metastatic breast cancer   SUMMARY OF ONCOLOGIC HISTORY: Oncology History Overview Note  Cancer Staging Malignant neoplasm of upper-inner quadrant of right breast in female, estrogen receptor positive ([place]) Staging form: Breast, AJCC 8th Edition - Clinical stage from 06/26/2017: Stage IIA (cT2, cN0, cM0, G3, ER: Positive, PR: Positive, HER2: Negative) - Signed by Truitt Merle, MD on 07/01/2017 - Pathologic stage from 07/08/2017: Stage IB (pT2, pN0, cM0, G3, ER+, PR+, HER2-, Oncotype DX score: 29) - Signed by [name], NP on 07/25/2017     Malignant neoplasm of upper-inner quadrant of right breast in female, estrogen receptor positive ([place])  06/23/2017 Mammogram   IMPRESSION: 1. There is a highly suspicious mass measuring 3.5 cm mammographically in the right breast at the palpable site identified by the patient. 2. There is 1 suspicious lymph node with cortex measuring up to 6 mm. 3.  No mammographic evidence of malignancy in the left breast.   06/26/2017 Initial Biopsy   Diagnosis 06/26/17 1. Breast, right, needle core biopsy, upper inner quadrant, 12:30 o'clock, 7cm from nipple - INVASIVE DUCTAL CARCINOMA - SEE COMMENT 2. Lymph node, needle/core biopsy, right axillary (level 2 node) - NO CARCINOMA IDENTIFIED IN ONE LYMPH NODE (0/1)   06/26/2017 Receptors her2   Prognostic indicators significant for: ER, 70% positive and PR, 70% positive, both with strong staining intensity. Proliferation marker Ki67 at 60%. HER2 negative.   07/01/2017  Initial Diagnosis   Malignant neoplasm of upper-inner quadrant of right breast in female, estrogen receptor positive ([place])   07/08/2017 Pathology Results   Diagnosis 1. Breast, lumpectomy, Right - INVASIVE DUCTAL CARCINOMA, NOTTINGHAM GRADE 3 OF 3, 3.5 CM - MARGINS UNINVOLVED BY CARCINOMA (0.1 CM, SUPERIOR MARGIN) - PREVIOUS BIOPSY SITE CHANGES - SEE ONCOLOGY TABLE BELOW 2. Soft tissue, biopsy, Axillary - BENIGN FIBROADIPOSE TISSUE - NO MALIGNANCY IDENTIFIED 3. Lymph node, sentinel, biopsy, Right axillary - NO CARCINOMA IDENTIFIED IN ONE LYMPH NODE (0/1) 4. Lymph node, sentinel, biopsy, Right axillary - NO CARCINOMA IDENTIFIED IN ONE LYMPH NODE (0/1) - PREVIOUS BIOPSY SITE CHANGES - SEE COMMENT 5. Breast, excision, Right superior margin - BENIGN BREAST TISSUE - NO RESIDUAL CARCINOMA IDENTIFIED 6. Breast, excision, Right inferior margin - BENIGN BREAST TISSUE - NO RESIDUAL CARCINOMA IDENTIFIED   07/08/2017 Cancer Staging   Staging form: Breast, AJCC 8th Edition - Pathologic stage from 07/08/2017: Stage IB (pT2, pN0, cM0, G3, ER+, PR+, HER2-, Oncotype DX score: 29) - Signed by [name], NP on 07/25/2017   07/22/2017 Oncotype testing   Recurrence Score: 29 Distant Recurrence Risk at 9 years with Ai or Tamoxifen alone: 18% Absolute Chemotherapy Benefit: > 15 %   08/08/2017 -  Chemotherapy   Adjuvant chemo Taxotere/Cytoxanq3 weeks x4 cycles with Udenyca -Granix added from cycle 2. Discontinued Udenyca following cycle 2.    11/10/2017 - 12/08/2017 Radiation Therapy   Radiation treatment dates:   11/10/2017 to 12/08/2017  Site/dose:    1. The Right breast was treated to 40.05 Gy in 15 fractions of 2.67 Gy. 2. The Right breast was boosted to 10 Gy in 5 fractions of 2  Gy.  Beams/energy:    1. 3D// 10X 2. Isodose plan w/photons // 10X, 6X  Narrative: The patient tolerated radiation treatment relatively well.   She developed moderate hyperpigmentation to the right breast with  treatment. She denies any pain or fatigue. She continues to use radiaplex twice daily as directed.    12/2017 - 11/2018 Anti-estrogen oral therapy   started Tamoxifen 17m daily in 12/2017. Stopped in 11/2018 due to cancer recurrance. Restarted in early 12/2018 and switched to Letrozole on 01/14/19.    08/11/2018 Survivorship   SCP virtual visit per LCira Rue NP    10/27/2018 Imaging   CT CAP W Contrast 10/27/18  IMPRESSION: 1. Multifocal lytic bone metastases are identified involving the left ribs, L2 vertebra, and right iliac wing. There is of pathologic fracture involving the L2 vertebra. 2. Single small, nonspecific pulmonary nodule in the right upper lobe is noted. 3. Ill-defined low-density structure in right lobe of liver measures 1.3 cm and is indeterminate. This may be better characterized with contrast enhanced liver MRI or PET-CT.   10/28/2018 Imaging   Whole Body Bone Scan 10/28/18  IMPRESSION: Sites of uptake in the left third and sixth ribs, right iliac crest and L2 vertebral body are compatible with sites of osseous metastatic disease with lesions visualized on CT from 1 day prior   Non-specific soft tissue uptake is seen at the site of prior right lumpectomy.   Likely urinary contamination in the soft tissues of the right pelvis, buttock and right flank.   11/12/2018 Relapse/Recurrence   Diagnosis 1. Bone, biopsy, Lumbar 2, Left Pedicle - BONE WITH MARROW FIBROSIS. - NO MALIGNANCY IDENTIFIED. 2. Bone, biopsy, Lumbar 2, Right Pedicle - METASTATIC CARCINOMA, SEE COMMENT.  The tumor cells are NEGATIVE for Her2 (0). Estrogen Receptor: 0%, NEGATIVE Progesterone Receptor: 0%, NEGATIVE   12/02/2018 - 12/18/2018 Radiation Therapy   L2 palliative RT (12/02/18-12/09/18) and will start palliative RT to right iliac lesion 12/10/18 and completed on 12/18/18 with Dr. SIsidore Moos   12/03/2018 Pathology Results   Diagnosis 12/03/18 Bone, biopsy, iliac crest, right - METASTATIC  CARCINOMA. Results: IMMUNOHISTOCHEMICAL AND MORPHOMETRIC ANALYSIS PERFORMED MANUALLY The tumor cells are NEGATIVE for Her2 (1+). Estrogen Receptor: 80%, POSITIVE, STRONG STAINING INTENSITY Progesterone Receptor: 20%, POSITIVE, STRONG STAINING INTENSITY   12/16/2018 -  Chemotherapy   Zometa injection q350monthstarting 12/16/18    12/28/2018 -  Chemotherapy   Ibrance 3 weeks on/1 week off starting 12/28/18 Letrozole once daily starting 01/14/19 with monthly Zoledex injections starting 12/16/18.      CURRENT THERAPY:  -Zometa infusion q3m62monthtarting 12/16/18 -Monthly Zoladex injections starting 12/16/18  -Restart tamoxifen in 1 week. Plan to switch with Letrozole with 2nd Zoladex injection 01/14/19.  -Pending Ibrance 3 weeks on/1 week off starting 12/28/18.   INTERVAL HISTORY: Ms. HolSailerturns for f/u as scheduled. She began cycle 2 Ibrance on 10/12. She has less nausea and fatigue than with cycle 1 so far. Her mild infrequent nausea is controlled with meds. Has BM every 3-4 days. She has laxative if she needs it. She is eating and drinking well. No fever, chills. Denies mucositis or bleeding. Neuropathy worse on the right than left is stable, she takes 400 mg gabapentin at night. She has had intermittent dizziness for many months which worsened last week, occurring near daily. She reports a "head rush" sensation. She did not fall but had to lower herself to the ground. Denies room spinning sensation. Only new medication was letrozole and getting her 2nd zoladex  injection near that time. Denies headaches or new pain. Back and pelvic pain improved significantly 1 month after completing RT. Her back pain is "100% better." takes Oxycodone BID and MS contin.   MEDICAL HISTORY:  Past Medical History:  Diagnosis Date   Anemia    Anxiety    Cancer ([place]) dx'd 06/2017   breast cancer right   Depression    Diabetes mellitus without complication ([facility])    GERD (gastroesophageal reflux disease)     Headache    History of radiation therapy 11/10/17- 12/08/17   right breast, 40.05 Gy in 15 fractions. Right breast boost 10 Gy in 5 fractions.    Personal history of chemotherapy    Personal history of radiation therapy     SURGICAL HISTORY: Past Surgical History:  Procedure Laterality Date   CERVICAL CONE BIOPSY     CHOLECYSTECTOMY     IR RADIOLOGIST EVAL & MGMT  11/03/2018   KYPHOPLASTY N/A 11/12/2018   Procedure: LUMBAR TWO  BIOPSY AND LUMBAR TWO KYPHOPLASTY;  Surgeon: Melina Schools, MD;  Location: [facility];  Service: Orthopedics;  Laterality: N/A;  90 mins   MASTECTOMY WITH RADIOACTIVE SEED GUIDED EXCISION AND AXILLARY SENTINEL LYMPH NODE BIOPSY Right 07/08/2017   Procedure: RIGHT LUMPTECTOMY WITH RADIOACTIVE SEED GUIDED EXCISION AND RIGHT AXILLARY SENTINEL LYMPH NODE BIOPSY ERAS PATHWAY;  Surgeon: [name], MD;  Location: [facility];  Service: General;  Laterality: Right;  PEC BLOCK   OPEN REDUCTION INTERNAL FIXATION (ORIF) DISTAL RADIAL FRACTURE  03/17/2012   Procedure: OPEN REDUCTION INTERNAL FIXATION (ORIF) DISTAL RADIAL FRACTURE;  Surgeon: Roseanne Kaufman, MD;  Location: [facility];  Service: Orthopedics;  Laterality: Right;  Pre-operative a supra-clavical block right arm in addition to general anesthesia   PORT-A-CATH REMOVAL Right 02/02/2018   Procedure: REMOVAL PORT-A-CATH;  Surgeon: [name], MD;  Location: [facility];  Service: General;  Laterality: Right;   PORTACATH PLACEMENT N/A 07/30/2017   Procedure: INSERTION PORT-A-CATH WITH [place];  Surgeon: [name], MD;  Location: [facility];  Service: General;  Laterality: N/A;   TUBAL LIGATION      I have reviewed the social history and family history with the patient and they are unchanged from previous note.  ALLERGIES:  has No Known Allergies.  MEDICATIONS:  Current Outpatient Medications  Medication Sig Dispense Refill   Ca & Phos-Vit D-Mag [phone] TABS Take 1 tablet by mouth daily. 20  tablet 0   gabapentin (NEURONTIN) 100 MG capsule Take 1 capsule in the morning and 3 capsules at bedtime 360 capsule 1   gabapentin (NEURONTIN) 300 MG capsule Take 1 capsule (300 mg total) by mouth 3 (three) times daily. 30 capsule 3   IBRANCE 125 MG tablet TAKE 1 TABLET (125 MG TOTAL) BY MOUTH DAILY. TAKE FOR 21 DAYS ON, 7 DAYS OFF, REPEAT EVERY 28 DAYS. 21 tablet 0   letrozole (FEMARA) 2.5 MG tablet Take 1 tablet (2.5 mg total) by mouth daily. 30 tablet 4   metFORMIN (GLUCOPHAGE) 500 MG tablet Take 1 tablet (500 mg total) by mouth 2 (two) times daily with a meal. 180 tablet 1   morphine (MS CONTIN) 30 MG 12 hr tablet Take 1 tablet (30 mg total) by mouth every 12 (twelve) hours. 60 tablet 0   ondansetron (ZOFRAN) 8 MG tablet Take 1 tablet (8 mg total) by mouth every 8 (eight) hours as needed for nausea or vomiting. 30 tablet 3   Oxycodone HCl 10 MG TABS Take 1 tablet (10 mg total)  by mouth every 8 (eight) hours as needed. 60 tablet 0   polyethylene glycol (MIRALAX) 17 g packet Take 17 g by mouth daily as needed. (Patient taking differently: Take 17 g by mouth daily as needed for mild constipation. ) 14 each 8   potassium chloride SA (KLOR-CON) 20 MEQ tablet Take 1 tablet (20 mEq total) by mouth 2 (two) times daily. 10 tablet 0   prochlorperazine (COMPAZINE) 10 MG tablet Take 1 tablet (10 mg total) by mouth every 6 (six) hours as needed for nausea or vomiting. 30 tablet 1   No current facility-administered medications for this visit.     PHYSICAL EXAMINATION: ECOG PERFORMANCE STATUS: 1 - Symptomatic but completely ambulatory  Vitals:   01/28/19 1503  BP: 118/79  Pulse: 98  Resp: 18  Temp: 97.6 F (36.4 C)  SpO2: 98%   Filed Weights   01/28/19 1503  Weight: 284 lb 1.6 oz (128.9 kg)    GENERAL:alert, no distress and comfortable SKIN: no rash  EYES: sclera clear LUNGS: clear with normal breathing effort HEART: regular rate & rhythm, no lower extremity  edema ABDOMEN:abdomen soft, non-tender and normal bowel sounds Musculoskeletal:no cyanosis of digits NEURO: alert & oriented x 3 with fluent speech, normal gait. Uses cane No PAC   LABORATORY DATA:  I have reviewed the data as listed CBC Latest Ref Rng & Units 01/28/2019 01/14/2019 12/22/2018  WBC 4.0 - 10.5 K/uL 3.1(L) 2.0(L) 5.2  Hemoglobin 12.0 - 15.0 g/dL 12.2 12.5 12.7  Hematocrit 36.0 - 46.0 % 36.7 37.9 40.1  Platelets 150 - 400 K/uL 258 156 222     CMP Latest Ref Rng & Units 01/28/2019 01/14/2019 12/22/2018  Glucose 70 - 99 mg/dL 106(H) 179(H) 111(H)  BUN 6 - 20 mg/dL 11 12 10   Creatinine 0.44 - 1.00 mg/dL 0.90 0.84 0.69  Sodium 135 - 145 mmol/L 143 141 140  Potassium 3.5 - 5.1 mmol/L 3.6 3.4(L) 3.6  Chloride 98 - 111 mmol/L 105 106 109  CO2 22 - 32 mmol/L 29 24 23   Calcium 8.9 - 10.3 mg/dL 8.8(L) 8.5(L) 6.9(LL)  Total Protein 6.5 - 8.1 g/dL 7.1 7.1 7.2  Total Bilirubin 0.3 - 1.2 mg/dL 0.2(L) 0.4 0.6  Alkaline Phos 38 - 126 U/L 62 59 57  AST 15 - 41 U/L 20 20 25   ALT 0 - 44 U/L 16 15 16       RADIOGRAPHIC STUDIES: I have personally reviewed the radiological images as listed and agreed with the findings in the report. No results found.   ASSESSMENT & PLAN: Netasha Wehrli is a 53 y.o. female with   1. 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 She is s/p right breastlumpectomy, adjuvantchemoTC and adjuvant radiation and was treated with Tamoxifen since 12/2017. Due to disease progression, she stopped on 01/14/19.  -Her 10/2018 CT CAP/bone scan showed bone lesions highly concerning for bone metastasis.She also has anindeterminate 1.3 cm liver lesion.  -She underwent L2 kyphoplasty for compression fractureby Dr. Rolena Infante, bone biopsy showed triple negative breast cancer.  -Repeated bone  biopsy from the right iliac lesion, which showed ER and PR positive HER-2 negative right-sided breast cancer. First bone biopsy ER and PR felt to be falsely negative due to the decalcification. -Given she has metastasis, her cancer is stage IV and no longer  curable. Treatment goal is disease control -S/p  palliative RT with Dr. Isidore Moos on 12/18/18; her back and pelvic pain improved significantly  -Given her ER/PR positive metastasis, Dr. Burr Medico started her on antiestrogen therapy with aromatase inhibitor Letrozole daily and oral CD4K inhibitor Ibrance 3 weeks on/1 week off. She started [place] on 12/28/18. she tolerated cycle 1 moderately well with fatigue and nausea -Given she is still perimenopausal, she started monthly Zoladex injections on 12/16/18 to go with her Letrozole which she started 01/14/19. She will considerBSO down the road.  -She developed increased dizziness shortly after 2nd zoladex and beginning letrozole, possibly related -Due to chills and flu like symptoms that were felt to be related to zometa, Dr. Burr Medico recommended to change to Xgeva injection which she will start in 03/2019 -Ms. Devita appears well. 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 -Her 09/2018 MRI spine was concerning for bone metastasisand L2 spine fracture, likely pathologic. -CT CAP from 10/27/18 and bone scan from 7/15/20whichshowuptake in left 3rd and 6th ribs, right iliac crest andlarge 4.9cm lytic lesion inL2 with pathologic fracture. -She underwent L2 Kyphoplastyand biopsy by Dr. Georgiann Mohs 11/12/18. -Her8/20/20 bone biopsy without decalcification showed her metastasis to be ER/PE positive and her2 negative.  -Shecompleted L2 palliative RT (12/02/18-12/09/18) and palliative RI to right iliac lesion (12/10/18-12/18/18)  with Dr. Isidore Moos.  -Continue calcium 1000 units daily  -On Gabapentin 319m to help her pain, takes 400 mg at night.  -Her pain is currently manageable with MS Contin 35mBID and Oxycodone 1029m-3 times a day.   3. N&V, Mild Constipation  -She notes she had nausea and once a week vomiting before start of current regimen.  -controlled with zofran and compazine -has BM q3-4 days, I encouraged her to take miralax for BM every 1-2 days   4. Depression/Anxiety  -worsened with disease progression -stable  5. Mild neutropenia -secondary to Ibrance -resolved  6.Goal of care discussion  -The patient understands the goal of care is palliative. -she is full code now  Disposition: Ms. HolEsquibelpears well. She completed cycle 1 Ibrance and continues Letrozole daily and monthly zoladex. Her nausea and fatigue are improved with cycle 2. She had increased dizziness last week which may have been the result of getting zoladex injection and starting letrozole. Dizziness has improved today. No headaches. Will monitor closely. If this worsens or she develops new neurological symptoms, will obtain brain imaging to r/o metastasis.   Labs reviewed, CBC and CMP are stable. She will continue cycle 2 Ibrance which she started 10/12. Plan to restage after cycle 2. She will return in 3 weeks for Zoladex, scan, and f/u before cycle 3.   Her pain improved after RT and remains adequately controlled, I refilled pain med for her today.   Orders Placed This Encounter  Procedures   CT Abdomen Pelvis W Contrast    Standing Status:   Future    Standing Expiration Date:   01/28/2020    Order Specific Question:   If indicated for the ordered procedure, I authorize the administration of contrast media per Radiology protocol    Answer:   Yes    Order Specific Question:   Is patient pregnant?    Answer:   No    Order Specific Question:   Preferred imaging location?    Answer:   [facility]    Order  Specific Question:   Is Oral Contrast requested for this exam?    Answer:   Yes, Per Radiology protocol    Order Specific Question:   Radiology Contrast Protocol - do NOT remove file path    Answer:   \[email or web address]   CT Chest W Contrast    Standing Status:   Future    Standing Expiration Date:   01/28/2020    Order Specific Question:   If indicated for the ordered procedure, I authorize the administration of contrast media per Radiology protocol    Answer:   Yes    Order Specific Question:   Is patient pregnant?    Answer:   No    Order Specific Question:   Preferred imaging location?    Answer:   [facility]    Order Specific Question:   Radiology Contrast Protocol - do NOT remove file path    Answer:   [email or web address]   NM Bone Scan Whole Body    Standing Status:   Future    Standing Expiration Date:   01/28/2020    Order Specific Question:   If indicated for the ordered procedure, I authorize the administration of a radiopharmaceutical per Radiology protocol    Answer:   Yes    Order Specific Question:   Is the patient pregnant?    Answer:   No    Order Specific Question:   Preferred imaging location?    Answer:   [facility]    Order Specific Question:   Radiology Contrast Protocol - do NOT remove file path    Answer:   [email or web address]    All questions were answered. The patient knows to call the clinic with any problems, questions or concerns. No barriers to learning was detected.     [name], NP 01/28/19

## 2019-01-29 ENCOUNTER — Telehealth: Payer: Self-pay | Admitting: Hematology

## 2019-01-29 LAB — CANCER ANTIGEN 27.29: CA 27.29: 27.7 U/mL (ref 0.0–38.6)

## 2019-01-29 NOTE — Telephone Encounter (Signed)
Scheduled appt per 10/15 los.  Spoke with pt and she is aware of her appt date and time

## 2019-02-08 ENCOUNTER — Encounter: Payer: Self-pay | Admitting: Radiation Oncology

## 2019-02-08 NOTE — Progress Notes (Addendum)
  Patient Name: Caitlin Cohen MRN: [id number] DOB: 12-15-1965 Referring Physician: Donne Hazel MATTHEW (Profile Not Attached) Date of Service: 12/18/2018 [facility], [place]                                                        End Of Treatment Note  Diagnoses: C79.51-Secondary malignant neoplasm of bone  Cancer Staging: Stage IV  Intent: Palliative  Radiation Treatment Dates: 12/02/2018 through 12/18/2018 Site Technique Total Dose Dose per Fx Completed Fx Beam Energies  Pelvis: Pelvis_Rt 3D 20/20 4 5/5 15X  Spine: Spine_L1-3 3D 30/30 3 10/10 15X  Thorax: Chest_Lt_ribs 3D 30/30 3 10/10 10X   Narrative: The patient tolerated radiation therapy relatively well.   Plan: The patient will follow-up with radiation oncology in 1 mo .  -----------------------------------  [name], MD

## 2019-02-10 ENCOUNTER — Telehealth: Payer: Self-pay

## 2019-02-10 NOTE — Telephone Encounter (Signed)
Faxed OV note 01/28/2019 next scans on on 11/3 and follow in office is 11/6, return to work status is unchanged.  This was faxed to [facility] with STD at fax [phone]

## 2019-02-15 ENCOUNTER — Other Ambulatory Visit: Payer: Self-pay

## 2019-02-15 ENCOUNTER — Telehealth: Payer: Self-pay

## 2019-02-15 ENCOUNTER — Telehealth: Payer: Self-pay | Admitting: Hematology

## 2019-02-15 ENCOUNTER — Other Ambulatory Visit: Payer: Self-pay | Admitting: Hematology

## 2019-02-15 ENCOUNTER — Inpatient Hospital Stay: Payer: Medicaid Other | Attending: Hematology

## 2019-02-15 VITALS — HR 82 | Temp 98.2°F | Resp 20

## 2019-02-15 DIAGNOSIS — R110 Nausea: Secondary | ICD-10-CM | POA: Diagnosis not present

## 2019-02-15 DIAGNOSIS — Z79811 Long term (current) use of aromatase inhibitors: Secondary | ICD-10-CM | POA: Insufficient documentation

## 2019-02-15 DIAGNOSIS — C7951 Secondary malignant neoplasm of bone: Secondary | ICD-10-CM | POA: Diagnosis not present

## 2019-02-15 DIAGNOSIS — F329 Major depressive disorder, single episode, unspecified: Secondary | ICD-10-CM | POA: Insufficient documentation

## 2019-02-15 DIAGNOSIS — E876 Hypokalemia: Secondary | ICD-10-CM | POA: Insufficient documentation

## 2019-02-15 DIAGNOSIS — F419 Anxiety disorder, unspecified: Secondary | ICD-10-CM | POA: Diagnosis not present

## 2019-02-15 DIAGNOSIS — Z95828 Presence of other vascular implants and grafts: Secondary | ICD-10-CM

## 2019-02-15 DIAGNOSIS — T451X5A Adverse effect of antineoplastic and immunosuppressive drugs, initial encounter: Secondary | ICD-10-CM | POA: Insufficient documentation

## 2019-02-15 DIAGNOSIS — D701 Agranulocytosis secondary to cancer chemotherapy: Secondary | ICD-10-CM | POA: Diagnosis not present

## 2019-02-15 DIAGNOSIS — E119 Type 2 diabetes mellitus without complications: Secondary | ICD-10-CM | POA: Diagnosis not present

## 2019-02-15 DIAGNOSIS — K5900 Constipation, unspecified: Secondary | ICD-10-CM | POA: Insufficient documentation

## 2019-02-15 DIAGNOSIS — C50211 Malignant neoplasm of upper-inner quadrant of right female breast: Secondary | ICD-10-CM | POA: Diagnosis present

## 2019-02-15 DIAGNOSIS — Z5111 Encounter for antineoplastic chemotherapy: Secondary | ICD-10-CM | POA: Insufficient documentation

## 2019-02-15 MED ORDER — OXYCODONE HCL 5 MG PO TABS: 5.0000 mg | ORAL_TABLET | Freq: Four times a day (QID) | ORAL | 0 refills | Status: DC | PRN

## 2019-02-15 MED ORDER — GOSERELIN ACETATE 3.6 MG ~~LOC~~ IMPL
3.6000 mg | DRUG_IMPLANT | Freq: Once | SUBCUTANEOUS | Status: AC
  Administered 2019-02-15: 12:00:00 3.6 mg via SUBCUTANEOUS

## 2019-02-15 MED ORDER — GOSERELIN ACETATE 3.6 MG ~~LOC~~ IMPL
DRUG_IMPLANT | SUBCUTANEOUS | Status: AC
  Filled 2019-02-15: qty 3.6

## 2019-02-15 NOTE — Telephone Encounter (Signed)
Pt called and requested oxycodone refill.  I called her back, and discussed her pain management.  She is currently on MS Contin 30 mg every 12 hours, and oxycodone 10 mg every 6-8 hours.  She feels she can wean herself off MS Contin, I suggest her change MS Contin to every night, once daily.  If she tolerates well, she can stop after 2 weeks.  I refilled her oxycodone as 5 mg, she can take 1 to 2 tablets every 6 hours as needed.  She agrees with the plan, and appreciated my call.  Truitt Merle  02/15/2019

## 2019-02-15 NOTE — Telephone Encounter (Signed)
Patient calls for refill on Oxycodone last filled on 10/15 #60

## 2019-02-15 NOTE — Patient Instructions (Signed)

## 2019-02-16 ENCOUNTER — Ambulatory Visit (HOSPITAL_COMMUNITY)
Admission: RE | Admit: 2019-02-16 | Discharge: 2019-02-16 | Disposition: A | Discharge status: Home / Self Care | Payer: Medicaid Other | Source: Ambulatory Visit | Attending: Nurse Practitioner | Admitting: Nurse Practitioner

## 2019-02-16 ENCOUNTER — Encounter (HOSPITAL_COMMUNITY): Payer: Self-pay

## 2019-02-16 ENCOUNTER — Ambulatory Visit (HOSPITAL_COMMUNITY): Payer: Medicaid Other

## 2019-02-16 ENCOUNTER — Telehealth: Payer: Self-pay

## 2019-02-16 DIAGNOSIS — D493 Neoplasm of unspecified behavior of breast: Secondary | ICD-10-CM | POA: Insufficient documentation

## 2019-02-16 HISTORY — DX: Secondary malignant neoplasm of bone: C79.51

## 2019-02-16 MED ORDER — TECHNETIUM TC 99M MEDRONATE IV KIT
21.5000 | PACK | Freq: Once | INTRAVENOUS | Status: AC | PRN
  Administered 2019-02-16: 09:00:00 21.5 via INTRAVENOUS

## 2019-02-16 MED ORDER — SODIUM CHLORIDE (PF) 0.9 % IJ SOLN
INTRAMUSCULAR | Status: AC
  Filled 2019-02-16: qty 50

## 2019-02-16 MED ORDER — IOHEXOL 300 MG/ML  SOLN
100.0000 mL | Freq: Once | INTRAMUSCULAR | Status: AC | PRN
  Administered 2019-02-16: 100 mL via INTRAVENOUS

## 2019-02-16 NOTE — Telephone Encounter (Signed)
Patient called left a VM that she has not had a BM in over a week. She said she's been using Miralax 1-2x a day but has had no relief. Updated Dr. Burr Medico who recommended increasing Miralax intake to 3-4x a day, and to also add Magnesium Citrate if the increase in Miralax doesn't seem to help.   Called patient back with above information, and instructed her to call our office back if she doesn't have any relief by Friday. Patient verbalized understanding and agreement. No other needs identified at this time.

## 2019-02-18 MED FILL — IBRANCE 125 MG TABS: 125 | 28 days supply | Qty: 21 | Fill #0

## 2019-02-18 NOTE — Progress Notes (Signed)
[employer]   [phone] [phone]   Clinic Follow up Note   Patient Care Team: Patient, No Pcp Per as PCP - General ([place]) Truitt Merle, MD as Consulting Physician (Hematology) [name], MD as Consulting Physician (General Surgery) [name], MD as Attending Physician (Radiation Oncology) [name], NP as Nurse Practitioner (Nurse Practitioner)  Date of Service:  02/19/2019  CHIEF COMPLAINT: Follow upof metastaticright breastcancer  SUMMARY OF ONCOLOGIC HISTORY: Oncology History Overview Note  Cancer Staging Malignant neoplasm of upper-inner quadrant of right breast in female, estrogen receptor positive ([place]) Staging form: Breast, AJCC 8th Edition - Clinical stage from 06/26/2017: Stage IIA (cT2, cN0, cM0, G3, ER: Positive, PR: Positive, HER2: Negative) - Signed by Truitt Merle, MD on 07/01/2017 - Pathologic stage from 07/08/2017: Stage IB (pT2, pN0, cM0, G3, ER+, PR+, HER2-, Oncotype DX score: 29) - Signed by [name], NP on 07/25/2017     Malignant neoplasm of upper-inner quadrant of right breast in female, estrogen receptor positive ([place])  06/23/2017 Mammogram   IMPRESSION: 1. There is a highly suspicious mass measuring 3.5 cm mammographically in the right breast at the palpable site identified by the patient. 2. There is 1 suspicious lymph node with cortex measuring up to 6 mm. 3.  No mammographic evidence of malignancy in the left breast.   06/26/2017 Initial Biopsy   Diagnosis 06/26/17 1. Breast, right, needle core biopsy, upper inner quadrant, 12:30 o'clock, 7cm from nipple - INVASIVE DUCTAL CARCINOMA - SEE COMMENT 2. Lymph node, needle/core biopsy, right axillary (level 2 node) - NO CARCINOMA IDENTIFIED IN ONE LYMPH NODE (0/1)   06/26/2017 Receptors her2   Prognostic indicators significant for: ER, 70% positive and PR, 70% positive, both with strong staining intensity. Proliferation marker Ki67 at  60%. HER2 negative.   07/01/2017 Initial Diagnosis   Malignant neoplasm of upper-inner quadrant of right breast in female, estrogen receptor positive ([place])   07/08/2017 Pathology Results   Diagnosis 1. Breast, lumpectomy, Right - INVASIVE DUCTAL CARCINOMA, NOTTINGHAM GRADE 3 OF 3, 3.5 CM - MARGINS UNINVOLVED BY CARCINOMA (0.1 CM, SUPERIOR MARGIN) - PREVIOUS BIOPSY SITE CHANGES - SEE ONCOLOGY TABLE BELOW 2. Soft tissue, biopsy, Axillary - BENIGN FIBROADIPOSE TISSUE - NO MALIGNANCY IDENTIFIED 3. Lymph node, sentinel, biopsy, Right axillary - NO CARCINOMA IDENTIFIED IN ONE LYMPH NODE (0/1) 4. Lymph node, sentinel, biopsy, Right axillary - NO CARCINOMA IDENTIFIED IN ONE LYMPH NODE (0/1) - PREVIOUS BIOPSY SITE CHANGES - SEE COMMENT 5. Breast, excision, Right superior margin - BENIGN BREAST TISSUE - NO RESIDUAL CARCINOMA IDENTIFIED 6. Breast, excision, Right inferior margin - BENIGN BREAST TISSUE - NO RESIDUAL CARCINOMA IDENTIFIED   07/08/2017 Cancer Staging   Staging form: Breast, AJCC 8th Edition - Pathologic stage from 07/08/2017: Stage IB (pT2, pN0, cM0, G3, ER+, PR+, HER2-, Oncotype DX score: 29) - Signed by [name], NP on 07/25/2017   07/22/2017 Oncotype testing   Recurrence Score: 29 Distant Recurrence Risk at 9 years with Ai or Tamoxifen alone: 18% Absolute Chemotherapy Benefit: > 15 %   08/08/2017 -  Chemotherapy   Adjuvant chemo Taxotere/Cytoxanq3 weeks x4 cycles with Udenyca -Granix added from cycle 2. Discontinued Udenyca following cycle 2.    11/10/2017 - 12/08/2017 Radiation Therapy   Radiation treatment dates:   11/10/2017 to 12/08/2017  Site/dose:    1. The Right breast was treated to 40.05 Gy in 15 fractions of 2.67 Gy. 2. The Right breast was boosted to 10 Gy in  5 fractions of 2 Gy.  Beams/energy:    1. 3D// 10X 2. Isodose plan w/photons // 10X, 6X  Narrative: The patient tolerated radiation treatment relatively well.   She developed moderate  hyperpigmentation to the right breast with treatment. She denies any pain or fatigue. She continues to use radiaplex twice daily as directed.    12/2017 - 11/2018 Anti-estrogen oral therapy   started Tamoxifen 68m daily in 12/2017. Stopped in 11/2018 due to cancer recurrance. Restarted in early 12/2018 and switched to Letrozole on 01/14/19.    08/11/2018 Survivorship   SCP virtual visit per LCira Rue NP    10/27/2018 Imaging   CT CAP W Contrast 10/27/18  IMPRESSION: 1. Multifocal lytic bone metastases are identified involving the left ribs, L2 vertebra, and right iliac wing. There is of pathologic fracture involving the L2 vertebra. 2. Single small, nonspecific pulmonary nodule in the right upper lobe is noted. 3. Ill-defined low-density structure in right lobe of liver measures 1.3 cm and is indeterminate. This may be better characterized with contrast enhanced liver MRI or PET-CT.   10/28/2018 Imaging   Whole Body Bone Scan 10/28/18  IMPRESSION: Sites of uptake in the left third and sixth ribs, right iliac crest and L2 vertebral body are compatible with sites of osseous metastatic disease with lesions visualized on CT from 1 day prior   Non-specific soft tissue uptake is seen at the site of prior right lumpectomy.   Likely urinary contamination in the soft tissues of the right pelvis, buttock and right flank.   11/12/2018 Relapse/Recurrence   Diagnosis 1. Bone, biopsy, Lumbar 2, Left Pedicle - BONE WITH MARROW FIBROSIS. - NO MALIGNANCY IDENTIFIED. 2. Bone, biopsy, Lumbar 2, Right Pedicle - METASTATIC CARCINOMA, SEE COMMENT.  The tumor cells are NEGATIVE for Her2 (0). Estrogen Receptor: 0%, NEGATIVE Progesterone Receptor: 0%, NEGATIVE   12/02/2018 - 12/18/2018 Radiation Therapy   L2 palliative RT (12/02/18-12/09/18) and will start palliative RT to right iliac lesion 12/10/18 and completed on 12/18/18 with Dr. SIsidore Moos   12/03/2018 Pathology Results   Diagnosis 12/03/18 Bone, biopsy,  iliac crest, right - METASTATIC CARCINOMA. Results: IMMUNOHISTOCHEMICAL AND MORPHOMETRIC ANALYSIS PERFORMED MANUALLY The tumor cells are NEGATIVE for Her2 (1+). Estrogen Receptor: 80%, POSITIVE, STRONG STAINING INTENSITY Progesterone Receptor: 20%, POSITIVE, STRONG STAINING INTENSITY   12/16/2018 -  Chemotherapy   Zometa injection q313monthstarting 12/16/18    12/28/2018 -  Chemotherapy   Ibrance 3 weeks on/1 week off starting 12/28/18 Letrozole once daily starting 01/14/19 with monthly Zoledex injections starting 12/16/18.    02/16/2019 Imaging   CT CAP W Contrast  IMPRESSION: Chest.   1. New peripheral consolidation in the LEFT upper lobe with air bronchograms adjacent to lytic lesions within the third and sixth rib is favored radiation change. Recommend attention on follow-up. 2. No new or progressive breast cancer in thorax.   Abdomen / Pelvis.   1. No evidence of metastatic disease in the soft tissues of the abdomen pelvis. 2. Increase in size of lytic metastasis in the RIGHT iliac CREST. 3. Interval L2 kyphoplasty at site a pathologic fracture.     02/16/2019 Imaging   Whole Body bone scan  IMPRESSION: 1. Stable to improved uptake associated with previous left anterior rib lesions. 2. Increased uptake within known lytic metastasis involving the L2 vertebra and anterior right iliac crest. 3. No new sites of disease.      CURRENT THERAPY:  -Zometa infusion q3m92monthtarting 12/16/18, Due to flu like symptoms will switch to  Xegeva with second dose in 03/2019 -Monthly Zoladex injections starting 12/16/18  -Restart tamoxifen in 1 week. She switched to Letrozole with 2nd Zoladex injection 01/14/19.  -Ibrance 3 weeks on/1 week off starting 12/28/18.    INTERVAL HISTORY:  Caitlin Cohen is here for a follow up. She presents to the clinic alone. She notes she is doing fairly well. She is on MS Contin 60m at night and oxycodone 5-111mat night and 29m21mn the AM and again if  needed during the day. She notes her back pain is manageable but worse pain is her right hip. She has been able to be more mobile and she feels stronger. She notes her nausea has improved and takes antiemetics to control this. She notes when she took CT contrast she was able to have BM. She notes she had to increase Miralax to manage her constipation from pain medication.   She notes recent dry cough which she attributes to post nasal draining from change in weather.  She plans to start next cycle Ibrance on 02/22/19. She notes she is still trying to find a PCP. She notes on Letrozole and Zoladex she has manageable hot flashes and mostly stable mood.    REVIEW OF SYSTEMS:   Constitutional: Denies fevers, chills or abnormal weight loss (+) Manageable hot flashes.  Eyes: Denies blurriness of vision Ears, nose, mouth, throat, and face: Denies mucositis or sore throat Respiratory: Denies dyspnea or wheezes (+) Recent cough Cardiovascular: Denies palpitation, chest discomfort or lower extremity swelling Gastrointestinal: (+) Nausea and constipation Skin: Denies abnormal skin rashes MSK: (+) Her back pain is now manageable. Her pain in right hip is still persistent  Lymphatics: Denies new lymphadenopathy or easy bruising Neurological:Denies numbness, tingling or new weaknesses Behavioral/Psych: Mood is stable, no new changes  All other systems were reviewed with the patient and are negative.  MEDICAL HISTORY:  Past Medical History:  Diagnosis Date  . Anemia   . Anxiety   . Cancer ([place] 06/2017   breast cancer right  . Depression   . Diabetes mellitus without complication ([place] . GERD (gastroesophageal reflux disease)   . Headache   . History of radiation therapy 11/10/17- 12/08/17   right breast, 40.05 Gy in 15 fractions. Right breast boost 10 Gy in 5 fractions.   . Metastatic cancer to bone (HCC[place]rianx'd 09/2018   pelvis, spine and ribs  . Personal history of chemotherapy   . Personal  history of radiation therapy     SURGICAL HISTORY: Past Surgical History:  Procedure Laterality Date  . CERVICAL CONE BIOPSY    . CHOLECYSTECTOMY    . IR RADIOLOGIST EVAL & MGMT  11/03/2018  . KYPHOPLASTY N/A 11/12/2018   Procedure: LUMBAR TWO  BIOPSY AND LUMBAR TWO KYPHOPLASTY;  Surgeon: BroMelina SchoolsD;  [facility];  Laterality: N/A;  90 mins  . MASTECTOMY WITH RADIOACTIVE SEED GUIDED EXCISION AND AXILLARY SENTINEL LYMPH NODE BIOPSY Right 07/08/2017   Procedure: RIGHT LUMPTECTOMY WITH RADIOACTIVE SEED GUIDED EXCISION AND RIGHT AXILLARY SENTINEL LYMPH NODE BIOPSY ERAS PATHWAY;  Surgeon: WakRolm BookbinderD;  [facility];  Laterality: Right;  PEC BLOCK  . OPEN REDUCTION INTERNAL FIXATION (ORIF) DISTAL RADIAL FRACTURE  03/17/2012   Procedure: OPEN REDUCTION INTERNAL FIXATION (ORIF) DISTAL RADIAL FRACTURE;  Surgeon: WilRoseanne KaufmanD;  [facility];  Laterality: Right;  Pre-operative a supra-clavical block right arm in addition to general anesthesia  .  PORT-A-CATH REMOVAL Right 02/02/2018   Procedure: REMOVAL PORT-A-CATH;  Surgeon: [name], MD;  Location: [facility];  Service: General;  Laterality: Right;  . PORTACATH PLACEMENT N/A 07/30/2017   Procedure: INSERTION PORT-A-CATH WITH [place];  Surgeon: [name], MD;  Location: [facility];  Service: General;  Laterality: N/A;  . TUBAL LIGATION      I have reviewed the social history and family history with the patient and they are unchanged from previous note.  ALLERGIES:  has No Known Allergies.  MEDICATIONS:  Current Outpatient Medications  Medication Sig Dispense Refill  . Ca & Phos-Vit D-Mag [phone] TABS Take 1 tablet by mouth daily. 20 tablet 0  . gabapentin (NEURONTIN) 100 MG capsule Take 1 capsule in the morning and 3 capsules at bedtime 360 capsule 1  . gabapentin (NEURONTIN) 300 MG capsule Take 1 capsule (300 mg total) by  mouth 3 (three) times daily. 30 capsule 3  . IBRANCE 125 MG tablet TAKE 1 TABLET (125 MG TOTAL) BY MOUTH DAILY. TAKE FOR 21 DAYS ON, 7 DAYS OFF, REPEAT EVERY 28 DAYS. 21 tablet 0  . letrozole (FEMARA) 2.5 MG tablet Take 1 tablet (2.5 mg total) by mouth daily. 30 tablet 4  . metFORMIN (GLUCOPHAGE) 500 MG tablet Take 1 tablet (500 mg total) by mouth 2 (two) times daily with a meal. 180 tablet 0  . ondansetron (ZOFRAN) 8 MG tablet Take 1 tablet (8 mg total) by mouth every 8 (eight) hours as needed for nausea or vomiting. 30 tablet 3  . oxyCODONE (OXY IR/ROXICODONE) 5 MG immediate release tablet Take 1-2 tablets (5-10 mg total) by mouth every 6 (six) hours as needed for severe pain. 112 tablet 0  . polyethylene glycol (MIRALAX) 17 g packet Take 17 g by mouth daily as needed. (Patient taking differently: Take 17 g by mouth daily as needed for mild constipation. ) 14 each 8  . prochlorperazine (COMPAZINE) 10 MG tablet Take 1 tablet (10 mg total) by mouth every 6 (six) hours as needed for nausea or vomiting. 30 tablet 1  . blood glucose meter kit and supplies KIT Dispense based on patient and insurance preference. Use up to four times daily as directed. (FOR ICD-9 250.00, 250.01). 1 each 0  . morphine (MS CONTIN) 15 MG 12 hr tablet Take 1 tablet (15 mg total) by mouth at bedtime. 10 tablet 0  . potassium chloride SA (KLOR-CON) 20 MEQ tablet Take 1 tablet (20 mEq total) by mouth 2 (two) times daily. 30 tablet 1   No current facility-administered medications for this visit.     PHYSICAL EXAMINATION: ECOG PERFORMANCE STATUS: 2 - Symptomatic, <50% confined to bed  Vitals:   02/19/19 1357  BP: 112/78  Pulse: 87  Resp: 18  Temp: 98 F (36.7 C)  SpO2: 95%   Filed Weights   02/19/19 1357  Weight: 286 lb 3.2 oz (129.8 kg)    GENERAL:alert, no distress and comfortable SKIN: skin color, texture, turgor are normal, no rashes or significant lesions EYES: normal, Conjunctiva are pink and non-injected,  sclera clear  NECK: supple, thyroid normal size, non-tender, without nodularity LYMPH:  no palpable lymphadenopathy in the cervical, axillary  LUNGS: clear to auscultation and percussion with normal breathing effort HEART: regular rate & rhythm and no murmurs and no lower extremity edema ABDOMEN:abdomen soft, non-tender and normal bowel sounds Musculoskeletal:no cyanosis of digits and no clubbing  NEURO: alert & oriented x 3 with fluent speech, no focal motor/sensory deficits  LABORATORY  DATA:  I have reviewed the data as listed CBC Latest Ref Rng & Units 02/19/2019 01/28/2019 01/14/2019  WBC 4.0 - 10.5 K/uL 2.6(L) 3.1(L) 2.0(L)  Hemoglobin 12.0 - 15.0 g/dL 11.4(L) 12.2 12.5  Hematocrit 36.0 - 46.0 % 34.8(L) 36.7 37.9  Platelets 150 - 400 K/uL 212 258 156     CMP Latest Ref Rng & Units 02/19/2019 01/28/2019 01/14/2019  Glucose 70 - 99 mg/dL 97 106(H) 179(H)  BUN 6 - 20 mg/dL _0 Creatinine 0.44 - 1.00 mg/dL 0.85 0.90 0.84  Sodium 135 - 145 mmol/L 142 143 141  Potassium 3.5 - 5.1 mmol/L 3.1(L) 3.6 3.4(L)  Chloride 98 - 111 mmol/L 104 105 106  CO2 22 - 32 mmol/L _1 Calcium 8.9 - 10.3 mg/dL 8.5(L) 8.8(L) 8.5(L)  Total Protein 6.5 - 8.1 g/dL 7.3 7.1 7.1  Total Bilirubin 0.3 - 1.2 mg/dL <0.2(L) 0.2(L) 0.4  Alkaline Phos 38 - 126 U/L 57 62 59  AST 15 - 41 U/L _2 ALT 0 - 44 U/L _3 RADIOGRAPHIC STUDIES: I have personally reviewed the radiological images as listed and agreed with the findings in the report. No results found.   ASSESSMENT & PLAN:  Caitlin Cohen is a 53 y.o. female with   1. 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 She is s/p right breastlumpectomy, adjuvantchemoTC and adjuvant radiation and was treated with Tamoxifen since 12/2017. Due to disease  progression, she stopped on 01/14/19.  -In 10/2018 she was diagnosed with cancer recurrence now metastatic to the bone confirmed on 11/2018  Second bone biopsy.  -She underwent palliative radiation to right iliac and L2 and Kyphoplasty for her cancer related bone pain. -I started systemic treatment with first line Letrozole starting 01/14/19 with monthly Zoladex injection and oral CD4K inhibitor Ibrance 3 weeks on/1 week off starting 12/28/18.  -She plans to have BSO in 2021 so she can stop Zoladex injections.  -I personally reviewed and discussed her CT CAP and bone scan from 02/16/19 which showed increase in size of lytic metastasis in the right iliac crest. And increased uptake in known lytic met of L2 compared to last scan in July 2020. No new sites of bone, chest, abdomen or pelvis. I discussed this growth is likely from before she started recent treatment in Sep. Previously seen liver lesion has resolved now.  -Will continue current regimen and repeat scan in 3 months.   -She has been tolerating Letrozole, Zoladex and Ibrance well with manageable hot flashes and mostly stable mood. She still has back and right hip pain, nausea and constipation. Management of this was reviewed with her. -Labs reviewed and adequate to continue treatment.  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 -She underwent L2 Kyphoplastyand biopsy by Dr. BGeorgiann Mohs7/30/20. -Her8/20/20 bone biopsy without decalcification showed her metastasis to be ER/PE positive and her2 negative.  -Shecompleted L2 palliative RT (12/02/18-12/09/18) and palliative RI to right iliac lesion (12/10/18-12/18/18) with Dr. SIsidore Moos -She started Zometa  q346monthon 12/16/18. She  had flu like symptoms, likely related with her. Will switch to Xgeva in 03/2019.  -I previously called in Gabapentin 336m to help her pain. She will continue Calcium.  -S/p Kyphoplasty and radiation her back pain is manageable and right hip pain stable. She ambulates with cane well.  -Pain now better controlled on MS Contin 326mat night with oxycodone 5 in the AM, as needed through the day and 5-1031mt night.  -I discussed continuing to ween her off MS Contin, will decrease MS Contin to 19m51mghtly (02/19/19), then a few nights a week if tolerable until she can ween off. She is agreeable.  3. N&V, Mild Constipation  -Her nausea is well controlled with daily Zofran and Compazine, continue.  -She has been able to eat and drink enough to maintain her weight.   -Her constipation is likely related to her pain medication use. To control this I recommend she increase her Miralax use, at least 3 times daily. She is agreeable.   4. Depression/Anxiety  -She has a hx of depression and was previously treated with medication. Not currently on medication and she feels that her depression is controlled.  -Mood is mostly stable on Letrozole and Zoladex.   5. Mild neutropenia -secondary to Ibrance, mild, will continue monitoring. Stable  6.Goal of care discussion  -We previously discussed the incurable nature of her cancer, and the overall poor prognosis, especially if she does not have good response to chemotherapy or progress on chemo -The patient understands the goal of care is palliative. -she is full code now  7. DM -She is currently on Metformin 500mg13m, diagnosed after initial chemotherapy. She is still looking for PCP to manage this.  -I will order her a meter kit for her to monitor her BG at home.   8. Hypokalemia -On 20meQ30massium, continue daily.    Plan -scan reviewed, stable disease overall -Today I refilled MS Contin at lower dose 19mg f30m10, she  will take one at night until finish, no more refill in future  -and I refilled Metformin, oral potassium and called in glucometer kit -Continue Zoladex monthly, Xgeva monthly, Letrozole daily, Ibrance 3 weeks on/1 week off. Next cycle starts on 11/9 -Lab, f/u, Zoladex and Xgeva on 12/2   No problem-specific Assessment & Plan notes found for this encounter.   No orders of the defined types were placed in this encounter.  All questions were answered. The patient knows to call the clinic with any problems, questions or concerns. No barriers to learning was detected. I spent 20 minutes counseling the patient face to face. The total time spent in the appointment was 25 minutes and more than 50% was on counseling and review of test results     Caitlin Cohen/09/2018   I, Caitlin Cohen Devonting as scribe for Alyria Krack FenTruitt Cohen I have reviewed the above documentation for accuracy and completeness, and I agree with the above.

## 2019-02-19 ENCOUNTER — Other Ambulatory Visit: Payer: Self-pay

## 2019-02-19 ENCOUNTER — Telehealth: Payer: Self-pay | Admitting: Hematology

## 2019-02-19 ENCOUNTER — Encounter: Payer: Self-pay | Admitting: Hematology

## 2019-02-19 ENCOUNTER — Inpatient Hospital Stay: Payer: Medicaid Other

## 2019-02-19 ENCOUNTER — Inpatient Hospital Stay (HOSPITAL_BASED_OUTPATIENT_CLINIC_OR_DEPARTMENT_OTHER): Payer: Medicaid Other | Admitting: Hematology

## 2019-02-19 VITALS — BP 112/78 | HR 87 | Temp 98.0°F | Resp 18 | Ht 67.0 in | Wt 286.2 lb

## 2019-02-19 DIAGNOSIS — Z170 Estrogen receptor positive status [ER+]: Secondary | ICD-10-CM

## 2019-02-19 DIAGNOSIS — Z5111 Encounter for antineoplastic chemotherapy: Secondary | ICD-10-CM | POA: Diagnosis not present

## 2019-02-19 DIAGNOSIS — C7951 Secondary malignant neoplasm of bone: Secondary | ICD-10-CM

## 2019-02-19 DIAGNOSIS — C50211 Malignant neoplasm of upper-inner quadrant of right female breast: Secondary | ICD-10-CM

## 2019-02-19 DIAGNOSIS — R7309 Other abnormal glucose: Secondary | ICD-10-CM

## 2019-02-19 DIAGNOSIS — E119 Type 2 diabetes mellitus without complications: Secondary | ICD-10-CM | POA: Diagnosis not present

## 2019-02-19 LAB — CMP (CANCER CENTER ONLY)
ALT: 8 U/L (ref 0–44)
AST: 15 U/L (ref 15–41)
Albumin: 3.4 g/dL — ABNORMAL LOW (ref 3.5–5.0)
Alkaline Phosphatase: 57 U/L (ref 38–126)
Anion gap: 9 (ref 5–15)
BUN: 13 mg/dL (ref 6–20)
CO2: 29 mmol/L (ref 22–32)
Calcium: 8.5 mg/dL — ABNORMAL LOW (ref 8.9–10.3)
Chloride: 104 mmol/L (ref 98–111)
Creatinine: 0.85 mg/dL (ref 0.44–1.00)
GFR, Est AFR Am: >60 mL/min (ref >60)
GFR, Estimated: >60 mL/min (ref >60)
Glucose, Bld: 97 mg/dL (ref 70–99)
Potassium: 3.1 mmol/L — ABNORMAL LOW (ref 3.5–5.1)
Sodium: 142 mmol/L (ref 135–145)
Total Bilirubin: <0.2 mg/dL — ABNORMAL LOW (ref 0.3–1.2)
Total Protein: 7.3 g/dL (ref 6.5–8.1)

## 2019-02-19 LAB — CBC WITH DIFFERENTIAL (CANCER CENTER ONLY)
Abs Immature Granulocytes: 0.01 10*3/uL (ref 0.00–0.07)
Basophils Absolute: 0 10*3/uL (ref 0.0–0.1)
Basophils Relative: 1 %
Eosinophils Absolute: 0 10*3/uL (ref 0.0–0.5)
Eosinophils Relative: 0 %
HCT: 34.8 % — ABNORMAL LOW (ref 36.0–46.0)
Hemoglobin: 11.4 g/dL — ABNORMAL LOW (ref 12.0–15.0)
Immature Granulocytes: 0 %
Lymphocytes Relative: 35 %
Lymphs Abs: 0.9 10*3/uL (ref 0.7–4.0)
MCH: 29 pg (ref 26.0–34.0)
MCHC: 32.8 g/dL (ref 30.0–36.0)
MCV: 88.5 fL (ref 80.0–100.0)
Monocytes Absolute: 0.2 10*3/uL (ref 0.1–1.0)
Monocytes Relative: 7 %
Neutro Abs: 1.5 10*3/uL — ABNORMAL LOW (ref 1.7–7.7)
Neutrophils Relative %: 57 %
Platelet Count: 212 10*3/uL (ref 150–400)
RBC: 3.93 MIL/uL (ref 3.87–5.11)
RDW: 17.8 % — ABNORMAL HIGH (ref 11.5–15.5)
WBC Count: 2.6 10*3/uL — ABNORMAL LOW (ref 4.0–10.5)
nRBC: 0 % (ref 0.0–0.2)

## 2019-02-19 MED ORDER — MORPHINE SULFATE ER 15 MG PO TBCR: 15.0000 mg | EXTENDED_RELEASE_TABLET | Freq: Every day | ORAL | 0 refills | Status: DC

## 2019-02-19 MED ORDER — POTASSIUM CHLORIDE CRYS ER 20 MEQ PO TBCR: 20.0000 meq | EXTENDED_RELEASE_TABLET | Freq: Two times a day (BID) | ORAL | 1 refills | Status: DC

## 2019-02-19 MED ORDER — BLOOD GLUCOSE MONITOR KIT: PACK | 0 refills | Status: AC

## 2019-02-19 MED ORDER — METFORMIN HCL 500 MG PO TABS: 500.0000 mg | ORAL_TABLET | Freq: Two times a day (BID) | ORAL | 0 refills | Status: DC

## 2019-02-19 NOTE — Telephone Encounter (Signed)
I talk with patient regarding schedule  

## 2019-02-20 LAB — CANCER ANTIGEN 27.29: CA 27.29: 40.4 U/mL — ABNORMAL HIGH (ref 0.0–38.6)

## 2019-02-25 ENCOUNTER — Other Ambulatory Visit: Payer: Self-pay | Admitting: Hematology

## 2019-02-25 ENCOUNTER — Telehealth: Payer: Self-pay

## 2019-02-25 MED ORDER — VENLAFAXINE HCL ER 75 MG PO CP24: 75.0000 mg | ORAL_CAPSULE | Freq: Every day | ORAL | 2 refills | Status: DC

## 2019-02-25 NOTE — Telephone Encounter (Signed)
I called her back, she is already on gabapentin, I will add Effexor 75mg  daily for her hot flushes. Potential side effects discussed with her and she agrees to proceed. I called in today.   Truitt Merle MD

## 2019-02-25 NOTE — Telephone Encounter (Signed)
Patient calls to let Dr. Burr Medico know that the hot flashes are getting much worse.  She is asking is there anything she can take to help with these.

## 2019-02-28 ENCOUNTER — Other Ambulatory Visit: Payer: Self-pay | Admitting: Hematology

## 2019-03-01 ENCOUNTER — Other Ambulatory Visit: Payer: Self-pay | Admitting: Hematology

## 2019-03-03 ENCOUNTER — Telehealth: Payer: Self-pay

## 2019-03-03 NOTE — Telephone Encounter (Signed)
Patient calls to report having dizzy spells ever since Friday, at first thought she was not eating and drinking enough so she made a point to do so, has fallen a couple of times.  Started taking Effexor Thursday night.  Consulted with Dr. Burr Medico and called patient back to let her know per Dr. Burr Medico she should stop the Effexor, check BP at home, upon arising stand for a few minutes prior to walking, if symptoms do not improve over next couple of days call back.  She verbalized an understanding.

## 2019-03-09 ENCOUNTER — Telehealth: Payer: Self-pay

## 2019-03-09 ENCOUNTER — Other Ambulatory Visit: Payer: Self-pay | Admitting: Hematology

## 2019-03-09 ENCOUNTER — Other Ambulatory Visit: Payer: Self-pay | Admitting: Nurse Practitioner

## 2019-03-09 MED ORDER — MORPHINE SULFATE ER 15 MG PO TBCR: 15.0000 mg | EXTENDED_RELEASE_TABLET | Freq: Every day | ORAL | 0 refills | Status: DC

## 2019-03-09 MED ORDER — OXYCODONE HCL 5 MG PO TABS: 5.0000 mg | ORAL_TABLET | Freq: Four times a day (QID) | ORAL | 0 refills | Status: DC | PRN

## 2019-03-09 NOTE — Telephone Encounter (Signed)
I called her back, she had a few falls due to dizziness from effexor, right side pelvic and hip pain got worse after the falls. She has came off Effexor. I recommend her to increase gabapentin to 600mg  at night for hot flushes and pain. I also refilled MS contin for 14 tabs, she will take 1 tab at night, and take every other night for the last 5 tills. She appreciated the call.   Truitt Merle MD

## 2019-03-09 NOTE — Telephone Encounter (Signed)
Left a voice message for patient regarding medication management below.  I asked her to call me back to let me know if she was able to wean off the MS Contin and what her pain level is.

## 2019-03-09 NOTE — Telephone Encounter (Signed)
The goal was to wean her off MS contin, she was given 10 tabs to take 1 at night until gone. How is her pain level? I will refill oxy. Thanks, Regan Rakers

## 2019-03-09 NOTE — Telephone Encounter (Signed)
Patient calls back stating she has had any of MS Contin in 3 or 4 days however she is having a lot of pain on the right side and she feels it helped with that.  She rates her pain 5-6/10.  She also is wanting to know is there anything else she can try for hot flashes.  She tried Effexor and had adverse side effects, dizziness, with multiple falls.

## 2019-03-09 NOTE — Telephone Encounter (Signed)
Patient calls for refills on Oxycdone last filled 11/2 #112 and MS Contin last filled 11/6 #10 ?  Routed to [name] NP  Patient's [phone]

## 2019-03-13 ENCOUNTER — Other Ambulatory Visit: Payer: Self-pay | Admitting: Hematology

## 2019-03-15 ENCOUNTER — Other Ambulatory Visit: Payer: Self-pay | Admitting: Hematology

## 2019-03-16 NOTE — Progress Notes (Signed)
[place]   [phone] [phone]   Clinic Follow up Note   Patient Care Team: Patient, No Pcp Per as PCP - General ([place]) Truitt Merle, MD as Consulting Physician (Hematology) [name], MD as Consulting Physician (General Surgery) [name], MD as Attending Physician (Radiation Oncology) [name], NP as Nurse Practitioner (Nurse Practitioner) 03/17/2019  CHIEF COMPLAINT: F/u metastatic right breast cancer   SUMMARY OF ONCOLOGIC HISTORY: Oncology History Overview Note  Cancer Staging Malignant neoplasm of upper-inner quadrant of right breast in female, estrogen receptor positive ([place]) Staging form: Breast, AJCC 8th Edition - Clinical stage from 06/26/2017: Stage IIA (cT2, cN0, cM0, G3, ER: Positive, PR: Positive, HER2: Negative) - Signed by Truitt Merle, MD on 07/01/2017 - Pathologic stage from 07/08/2017: Stage IB (pT2, pN0, cM0, G3, ER+, PR+, HER2-, Oncotype DX score: 29) - Signed by [name], NP on 07/25/2017     Malignant neoplasm of upper-inner quadrant of right breast in female, estrogen receptor positive ([place])  06/23/2017 Mammogram   IMPRESSION: 1. There is a highly suspicious mass measuring 3.5 cm mammographically in the right breast at the palpable site identified by the patient. 2. There is 1 suspicious lymph node with cortex measuring up to 6 mm. 3.  No mammographic evidence of malignancy in the left breast.   06/26/2017 Initial Biopsy   Diagnosis 06/26/17 1. Breast, right, needle core biopsy, upper inner quadrant, 12:30 o'clock, 7cm from nipple - INVASIVE DUCTAL CARCINOMA - SEE COMMENT 2. Lymph node, needle/core biopsy, right axillary (level 2 node) - NO CARCINOMA IDENTIFIED IN ONE LYMPH NODE (0/1)   06/26/2017 Receptors her2   Prognostic indicators significant for: ER, 70% positive and PR, 70% positive, both with strong staining intensity. Proliferation marker Ki67 at 60%. HER2 negative.    07/01/2017 Initial Diagnosis   Malignant neoplasm of upper-inner quadrant of right breast in female, estrogen receptor positive ([place])   07/08/2017 Pathology Results   Diagnosis 1. Breast, lumpectomy, Right - INVASIVE DUCTAL CARCINOMA, NOTTINGHAM GRADE 3 OF 3, 3.5 CM - MARGINS UNINVOLVED BY CARCINOMA (0.1 CM, SUPERIOR MARGIN) - PREVIOUS BIOPSY SITE CHANGES - SEE ONCOLOGY TABLE BELOW 2. Soft tissue, biopsy, Axillary - BENIGN FIBROADIPOSE TISSUE - NO MALIGNANCY IDENTIFIED 3. Lymph node, sentinel, biopsy, Right axillary - NO CARCINOMA IDENTIFIED IN ONE LYMPH NODE (0/1) 4. Lymph node, sentinel, biopsy, Right axillary - NO CARCINOMA IDENTIFIED IN ONE LYMPH NODE (0/1) - PREVIOUS BIOPSY SITE CHANGES - SEE COMMENT 5. Breast, excision, Right superior margin - BENIGN BREAST TISSUE - NO RESIDUAL CARCINOMA IDENTIFIED 6. Breast, excision, Right inferior margin - BENIGN BREAST TISSUE - NO RESIDUAL CARCINOMA IDENTIFIED   07/08/2017 Cancer Staging   Staging form: Breast, AJCC 8th Edition - Pathologic stage from 07/08/2017: Stage IB (pT2, pN0, cM0, G3, ER+, PR+, HER2-, Oncotype DX score: 29) - Signed by [name], NP on 07/25/2017   07/22/2017 Oncotype testing   Recurrence Score: 29 Distant Recurrence Risk at 9 years with Ai or Tamoxifen alone: 18% Absolute Chemotherapy Benefit: > 15 %   08/08/2017 -  Chemotherapy   Adjuvant chemo Taxotere/Cytoxanq3 weeks x4 cycles with Udenyca -Granix added from cycle 2. Discontinued Udenyca following cycle 2.    11/10/2017 - 12/08/2017 Radiation Therapy   Radiation treatment dates:   11/10/2017 to 12/08/2017  Site/dose:    1. The Right breast was treated to 40.05 Gy in 15 fractions of 2.67 Gy. 2. The Right breast was boosted to 10 Gy in 5 fractions of  2 Gy.  Beams/energy:    1. 3D// 10X 2. Isodose plan w/photons // 10X, 6X  Narrative: The patient tolerated radiation treatment relatively well.   She developed moderate hyperpigmentation to the right  breast with treatment. She denies any pain or fatigue. She continues to use radiaplex twice daily as directed.    12/2017 - 11/2018 Anti-estrogen oral therapy   started Tamoxifen 66m daily in 12/2017. Stopped in 11/2018 due to cancer recurrance. Restarted in early 12/2018 and switched to Letrozole on 01/14/19.    08/11/2018 Survivorship   SCP virtual visit per LCira Rue NP    10/27/2018 Imaging   CT CAP W Contrast 10/27/18  IMPRESSION: 1. Multifocal lytic bone metastases are identified involving the left ribs, L2 vertebra, and right iliac wing. There is of pathologic fracture involving the L2 vertebra. 2. Single small, nonspecific pulmonary nodule in the right upper lobe is noted. 3. Ill-defined low-density structure in right lobe of liver measures 1.3 cm and is indeterminate. This may be better characterized with contrast enhanced liver MRI or PET-CT.   10/28/2018 Imaging   Whole Body Bone Scan 10/28/18  IMPRESSION: Sites of uptake in the left third and sixth ribs, right iliac crest and L2 vertebral body are compatible with sites of osseous metastatic disease with lesions visualized on CT from 1 day prior   Non-specific soft tissue uptake is seen at the site of prior right lumpectomy.   Likely urinary contamination in the soft tissues of the right pelvis, buttock and right flank.   11/12/2018 Relapse/Recurrence   Diagnosis 1. Bone, biopsy, Lumbar 2, Left Pedicle - BONE WITH MARROW FIBROSIS. - NO MALIGNANCY IDENTIFIED. 2. Bone, biopsy, Lumbar 2, Right Pedicle - METASTATIC CARCINOMA, SEE COMMENT.  The tumor cells are NEGATIVE for Her2 (0). Estrogen Receptor: 0%, NEGATIVE Progesterone Receptor: 0%, NEGATIVE   12/02/2018 - 12/18/2018 Radiation Therapy   L2 palliative RT (12/02/18-12/09/18) and will start palliative RT to right iliac lesion 12/10/18 and completed on 12/18/18 with Dr. SIsidore Moos   12/03/2018 Pathology Results   Diagnosis 12/03/18 Bone, biopsy, iliac crest, right -  METASTATIC CARCINOMA. Results: IMMUNOHISTOCHEMICAL AND MORPHOMETRIC ANALYSIS PERFORMED MANUALLY The tumor cells are NEGATIVE for Her2 (1+). Estrogen Receptor: 80%, POSITIVE, STRONG STAINING INTENSITY Progesterone Receptor: 20%, POSITIVE, STRONG STAINING INTENSITY   12/16/2018 -  Chemotherapy   Zometa injection q397monthstarting 12/16/18    12/28/2018 -  Chemotherapy   Ibrance 3 weeks on/1 week off starting 12/28/18 Letrozole once daily starting 01/14/19 with monthly Zoledex injections starting 12/16/18.    02/16/2019 Imaging   CT CAP W Contrast  IMPRESSION: Chest.   1. New peripheral consolidation in the LEFT upper lobe with air bronchograms adjacent to lytic lesions within the third and sixth rib is favored radiation change. Recommend attention on follow-up. 2. No new or progressive breast cancer in thorax.   Abdomen / Pelvis.   1. No evidence of metastatic disease in the soft tissues of the abdomen pelvis. 2. Increase in size of lytic metastasis in the RIGHT iliac CREST. 3. Interval L2 kyphoplasty at site a pathologic fracture.     02/16/2019 Imaging   Whole Body bone scan  IMPRESSION: 1. Stable to improved uptake associated with previous left anterior rib lesions. 2. Increased uptake within known lytic metastasis involving the L2 vertebra and anterior right iliac crest. 3. No new sites of disease.     CURRENT THERAPY:  -Zometa [id number] 12/16/18, Due to flu like symptoms will [facility] in  03/2019 -Monthly Zoladex injections starting9/2/20 -Restarted tamoxifen. She switched to Letrozole with 2nd Zoladex injection10/1/20. -Ibrance 3 weeks on/1 week off starting9/14/20.  INTERVAL HISTORY: Ms. Cookston returns for f/u as scheduled. She is off Ibrance this week. She feels well, appetite and energy are improved. No recent n/v/c/d, not having to use laxatives or compazine lately. Hot flashes are better on increased gabapentin 600 mg at  night, continues 300 AM and PM. The dizziness resolved after stopping effexor, no falls lately. She fell on right side but her baseline right back/flank and pelvic pain are no worse. She tried to wean off MS contin, pain level is 6/10 when taking it every other day, pain does improve to 4/10 when she takes is. When she skips MS contin she wants to rest more, pain limits activity. Takes oxycodone 10 mg BID. Denies new pain. 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 She noticed some numbness and tingling in her right face near eye and temple "some time ago." Denies rash, pain, vision changes, headaches. Otherwise, denies fever, chills, cough, chest pain, dyspnea, leg swelling, mucositis.    MEDICAL HISTORY:  Past Medical History:  Diagnosis Date  . Anemia   . Anxiety   . Cancer ([place]) dx'd 06/2017   breast cancer right  . Depression   . Diabetes mellitus without complication ([place])   . GERD (gastroesophageal reflux disease)   . Headache   . History of radiation therapy 11/10/17- 12/08/17   right breast, 40.05 Gy in 15 fractions. Right breast boost 10 Gy in 5 fractions.   . Metastatic cancer to bone ([place]) dx'd 09/2018   pelvis, spine and ribs  . Personal history of chemotherapy   . Personal history of radiation therapy     SURGICAL HISTORY: Past Surgical History:  Procedure Laterality Date  . CERVICAL CONE BIOPSY    . CHOLECYSTECTOMY    . IR RADIOLOGIST EVAL & MGMT  11/03/2018  . KYPHOPLASTY N/A 11/12/2018   Procedure: LUMBAR TWO  BIOPSY AND LUMBAR TWO KYPHOPLASTY;  Surgeon: Melina Schools, MD;  Location: [facility];  Service: Orthopedics;  Laterality: N/A;  90 mins  . MASTECTOMY WITH RADIOACTIVE SEED GUIDED EXCISION AND AXILLARY SENTINEL LYMPH NODE BIOPSY Right 07/08/2017   Procedure: RIGHT LUMPTECTOMY WITH RADIOACTIVE SEED GUIDED EXCISION AND RIGHT AXILLARY SENTINEL LYMPH NODE BIOPSY ERAS PATHWAY;  Surgeon: [name], MD;  Location: [facility];  Service: General;  Laterality: Right;  PEC BLOCK  .  OPEN REDUCTION INTERNAL FIXATION (ORIF) DISTAL RADIAL FRACTURE  03/17/2012   Procedure: OPEN REDUCTION INTERNAL FIXATION (ORIF) DISTAL RADIAL FRACTURE;  Surgeon: Roseanne Kaufman, MD;  Location: [facility];  Service: Orthopedics;  Laterality: Right;  Pre-operative a supra-clavical block right arm in addition to general anesthesia  . PORT-A-CATH REMOVAL Right 02/02/2018   Procedure: REMOVAL PORT-A-CATH;  Surgeon: [name], MD;  Location: [facility];  Service: General;  Laterality: Right;  . PORTACATH PLACEMENT N/A 07/30/2017   Procedure: INSERTION PORT-A-CATH WITH [place];  Surgeon: [name], MD;  Location: [place];  Service: General;  Laterality: N/A;  . TUBAL LIGATION      I have reviewed the social history and family history with the patient and they are unchanged from previous note.  ALLERGIES:  has No Known Allergies.  MEDICATIONS:  Current Outpatient Medications  Medication Sig Dispense Refill  . blood glucose meter kit and supplies KIT Dispense based on patient and insurance preference. Use up to four times daily as directed. (FOR ICD-9 250.00, 250.01). 1 each  0  . Ca & Phos-Vit D-Mag [phone] TABS Take 1 tablet by mouth daily. 20 tablet 0  . gabapentin (NEURONTIN) 300 MG capsule Take 1 capsule morning and afternoon and 2 capsules at bedtime 270 capsule 0  . IBRANCE 125 MG tablet TAKE 1 TABLET (125 MG TOTAL) BY MOUTH DAILY. TAKE FOR 21 DAYS ON, 7 DAYS OFF, REPEAT EVERY 28 DAYS. 21 tablet 0  . KLOR-CON M20 20 MEQ tablet TAKE 1 TABLET BY MOUTH TWICE A DAY 60 tablet 1  . letrozole (FEMARA) 2.5 MG tablet Take 1 tablet (2.5 mg total) by mouth daily. 30 tablet 4  . metFORMIN (GLUCOPHAGE) 500 MG tablet Take 1 tablet (500 mg total) by mouth 2 (two) times daily with a meal. 180 tablet 0  . morphine (MS CONTIN) 15 MG 12 hr tablet Take 1 tablet (15 mg total) by mouth at bedtime. 30 tablet 0  . ondansetron (ZOFRAN) 8 MG tablet Take 1 tablet (8 mg total) by mouth every 8  (eight) hours as needed for nausea or vomiting. 30 tablet 3  . oxyCODONE (OXY IR/ROXICODONE) 5 MG immediate release tablet Take 1-2 tablets (5-10 mg total) by mouth every 6 (six) hours as needed for severe pain. 112 tablet 0  . polyethylene glycol (MIRALAX) 17 g packet Take 17 g by mouth daily as needed. (Patient taking differently: Take 17 g by mouth daily as needed for mild constipation. ) 14 each 8  . prochlorperazine (COMPAZINE) 10 MG tablet Take 1 tablet (10 mg total) by mouth every 6 (six) hours as needed for nausea or vomiting. 30 tablet 1   No current facility-administered medications for this visit.     PHYSICAL EXAMINATION: ECOG PERFORMANCE STATUS: 1 - Symptomatic but completely ambulatory  Vitals:   03/17/19 1145  BP: 123/88  Pulse: (!) 108  Resp: 17  Temp: 97.8 F (36.6 C)  SpO2: 97%   Filed Weights   03/17/19 1145  Weight: 275 lb 12.8 oz (125.1 kg)    GENERAL:alert, no distress and comfortable SKIN: no rash  EYES:  sclera clear LYMPH:  no palpable cervical or supraclavicular lymphadenopathy LUNGS: clear with normal breathing effort HEART: regular rate & rhythm, no lower extremity edema ABDOMEN: abdomen soft, non-tender and normal bowel sounds Musculoskeletal: no focal tenderness, pain is not reproducible  NEURO: alert & oriented x 3 with fluent speech  LABORATORY DATA:  I have reviewed the data as listed CBC Latest Ref Rng & Units 03/17/2019 02/19/2019 01/28/2019  WBC 4.0 - 10.5 K/uL 2.8(L) 2.6(L) 3.1(L)  Hemoglobin 12.0 - 15.0 g/dL 12.3 11.4(L) 12.2  Hematocrit 36.0 - 46.0 % 36.6 34.8(L) 36.7  Platelets 150 - 400 K/uL 161 212 258     CMP Latest Ref Rng & Units 03/17/2019 02/19/2019 01/28/2019  Glucose 70 - 99 mg/dL 114(H) 97 106(H)  BUN 6 - 20 mg/dL 17 13 11   Creatinine 0.44 - 1.00 mg/dL 0.95 0.85 0.90  Sodium 135 - 145 mmol/L 142 142 143  Potassium 3.5 - 5.1 mmol/L 3.8 3.1(L) 3.6  Chloride 98 - 111 mmol/L 105 104 105  CO2 22 - 32 mmol/L 27 29 29    Calcium 8.9 - 10.3 mg/dL 8.9 8.5(L) 8.8(L)  Total Protein 6.5 - 8.1 g/dL 7.2 7.3 7.1  Total Bilirubin 0.3 - 1.2 mg/dL 0.3 <0.2(L) 0.2(L)  Alkaline Phos 38 - 126 U/L 64 57 62  AST 15 - 41 U/L 15 15 20   ALT 0 - 44 U/L 13 8 16  RADIOGRAPHIC STUDIES: I have personally reviewed the radiological images as listed and agreed with the findings in the report. No results found.   ASSESSMENT & PLAN: Khalia Gong Holtis a 53 y.o.femalewith   1. 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 She is s/p right breastlumpectomy, adjuvantchemoTC and adjuvant radiationand was treated with Tamoxifen since 12/2017. Due to disease progression, she stoppedon 01/14/19. -Her 7/2020CT CAP/bone scanshowedbone lesions highly concerning for bone metastasis.She also has anindeterminate 1.3 cm liver lesion. -She underwent L2 kyphoplasty for compression fractureby Dr. Rolena Infante, bone biopsy showed triple negative breast cancer.  -Repeated bone biopsy from the right iliac lesion, which showed ER and PR positive HER-2 negative right-sided breast cancer. First bone biopsy ER and PR felt to be falsely negative due to the decalcification. -Given she has metastasis, her cancer is stage IV and no longer curable. Treatment goal is disease control -S/p palliative RT with Dr. Isidore Moos on 12/18/18; her back and pelvic pain improved but not resolved -Given her ER/PR positive metastasis, Dr. Burr Medico started her on antiestrogen therapy with aromatase inhibitorLetrozoledaily and oral CD4K inhibitor Ibrance 3 weeks on/1 week off.She started Ibrance on9/14/20.she tolerated cycle 1 moderately well with fatigue and nausea -Given she is still perimenopausal, she started monthly Zoladex injections on 12/16/18 in combination with Letrozole which she  started 01/14/19. She willconsiderBSO down the road. -She developed increased dizziness shortly after 2nd zoladex and beginning letrozole, possibly related. Dizziness worsened after she started effexor for hot flashes and ultimately she fell at home. effexor discontinued and dizziness resolved.  -per instruction she was told to increase gabapentin to 600 mg at night, continue 300 mg AM/PM, which helped her hot flashes -Due to chills and flu like symptoms that were felt to be related to zometa, Dr. Burr Medico recommended to change to [facility], first injection today 03/2019 -Restaging CT on 02/16/19 showed increased size of lytic lesion in right iliac crest and increased uptake in L2 compared to 10/2018. The previously seen liver lesion had resolved. No new metastasis. Dr. Burr Medico felt the progression was from before she started Ibrance and recommended for her to continue treatment -Ms. Tacker appears well today, she completed cycle 3 Ibrance which she tolerated well overall. Not much nausea or fatigue. She has recovered well.  -CBC and CMP are stable, adequate to continue treatment. CA 27.29 pending.  -She will proceed with cycle 4 Ibrance starting on 03/22/19 and continue letrozole daily  -F/u before next cycle in 4 weeks with xgeva and zoladex injections  2.Diffuse bonemetastasis from breast cancer, with L2 compression fracture(s/p kyphoplasty) -Onset in 04/2018, initial mild and intermittent, severe pain for 4-6 weeks. -Her 09/2018 MRI spine was concerning for bone metastasisand L2 spine fracture, likely pathologic. -CT CAP from 10/27/18 and bone scan from 7/15/20whichshowuptake in left 3rd and 6th ribs, right iliac crest andlarge 4.9cm lytic lesion inL2 with pathologic fracture. -She underwent L2 Kyphoplastyand biopsy by Dr. Georgiann Mohs 11/12/18. -Her8/20/20 bone biopsy without decalcification showed her metastasis to be ER/PE positive and her2 negative.  -Shecompleted L2 palliative RT (12/02/18-12/09/18)  and palliative RIto right iliac lesion (12/10/18-12/18/18)with Dr. Isidore Moos.  -Continuecalcium 1000 unitsdaily -on gabapentin 300 AM/PM and 600 mg daily which also improved her hot flashes secondary to AI and zoladex -at her last visit with Dr. Burr Medico, she tried to wean off MS contin taking  1 tab qHS then every other night, her pain worsened, limiting her activity. I do not feel she is ready to wean off MS contin completely. I refilled for her for 1 month, take 1 qHS. Will re-evaluate her readiness to wean at next visit  -I recommend to try 5 mg oxycodone for moderate pain rather than 10 mg, she will try   3. N&V, Mild Constipation  -improved recently, no significant n/v/c -managed with compazine or zofran BID preventatively, and laxatives PRN. Has not needed lately   4. Depression/Anxiety  -worsened with disease progression -stable  5. Mild neutropenia -secondary to Ibrance -stable  6.Goal of care discussion  -The patient understands the goal of care is palliative. -she is full code now  7. DM -diagnosed after initial chemo -started metformin 500 mg BID -has a meter but not checking BG at home yet, I recommend for her to start checking when she wakes up and 2 hours after PM meal  8. Hypokalemia  -on 20 mEq daily, stable -continue   PLAN: -Labs reviewed -Zoladex and Xgeva today -Begin next cycle 4 Ibrance on 12/7, 3 weeks on 1 week off -Continue zoladex and xgeva monthly, letrozole daily  -Refilled MS contin 1 qHS, not able to wean off, and gabapentin  -Will attempt to wean off MS contin next month if able  -Reduce oxycodone to 5 mg rather than 10 mg for moderate to severe pain if able  -Reviewed home glucometer  -Lab, f/u, xgeva and zoladex in 4 weeks   All questions were answered. The patient knows to call the clinic with any problems, questions or concerns. No barriers to learning was detected. I spent 20 minutes counseling the patient face to face. The total time  spent in the appointment was 25 minutes and more than 50% was on counseling and review of test results     [name], NP 03/17/19

## 2019-03-17 ENCOUNTER — Encounter: Payer: Self-pay | Admitting: Nurse Practitioner

## 2019-03-17 ENCOUNTER — Inpatient Hospital Stay (HOSPITAL_BASED_OUTPATIENT_CLINIC_OR_DEPARTMENT_OTHER): Payer: Medicaid Other | Admitting: Nurse Practitioner

## 2019-03-17 ENCOUNTER — Other Ambulatory Visit: Payer: Self-pay

## 2019-03-17 ENCOUNTER — Inpatient Hospital Stay: Payer: Medicaid Other

## 2019-03-17 ENCOUNTER — Inpatient Hospital Stay: Payer: Medicaid Other | Attending: Hematology

## 2019-03-17 VITALS — BP 123/88 | HR 108 | Temp 97.8°F | Resp 17 | Ht 67.0 in | Wt 275.8 lb

## 2019-03-17 DIAGNOSIS — Z170 Estrogen receptor positive status [ER+]: Secondary | ICD-10-CM

## 2019-03-17 DIAGNOSIS — C50211 Malignant neoplasm of upper-inner quadrant of right female breast: Secondary | ICD-10-CM | POA: Diagnosis not present

## 2019-03-17 DIAGNOSIS — Z95828 Presence of other vascular implants and grafts: Secondary | ICD-10-CM

## 2019-03-17 DIAGNOSIS — C7951 Secondary malignant neoplasm of bone: Secondary | ICD-10-CM | POA: Diagnosis not present

## 2019-03-17 DIAGNOSIS — Z5111 Encounter for antineoplastic chemotherapy: Secondary | ICD-10-CM | POA: Insufficient documentation

## 2019-03-17 LAB — CBC WITH DIFFERENTIAL (CANCER CENTER ONLY)
Abs Immature Granulocytes: 0.01 10*3/uL (ref 0.00–0.07)
Basophils Absolute: 0 10*3/uL (ref 0.0–0.1)
Basophils Relative: 1 %
Eosinophils Absolute: 0 10*3/uL (ref 0.0–0.5)
Eosinophils Relative: 0 %
HCT: 36.6 % (ref 36.0–46.0)
Hemoglobin: 12.3 g/dL (ref 12.0–15.0)
Immature Granulocytes: 0 %
Lymphocytes Relative: 34 %
Lymphs Abs: 0.9 10*3/uL (ref 0.7–4.0)
MCH: 29.9 pg (ref 26.0–34.0)
MCHC: 33.6 g/dL (ref 30.0–36.0)
MCV: 88.8 fL (ref 80.0–100.0)
Monocytes Absolute: 0.1 10*3/uL (ref 0.1–1.0)
Monocytes Relative: 4 %
Neutro Abs: 1.7 10*3/uL (ref 1.7–7.7)
Neutrophils Relative %: 61 %
Platelet Count: 161 10*3/uL (ref 150–400)
RBC: 4.12 MIL/uL (ref 3.87–5.11)
RDW: 18.5 % — ABNORMAL HIGH (ref 11.5–15.5)
WBC Count: 2.8 10*3/uL — ABNORMAL LOW (ref 4.0–10.5)
nRBC: 0 % (ref 0.0–0.2)

## 2019-03-17 LAB — CMP (CANCER CENTER ONLY)
ALT: 13 U/L (ref 0–44)
AST: 15 U/L (ref 15–41)
Albumin: 3.5 g/dL (ref 3.5–5.0)
Alkaline Phosphatase: 64 U/L (ref 38–126)
Anion gap: 10 (ref 5–15)
BUN: 17 mg/dL (ref 6–20)
CO2: 27 mmol/L (ref 22–32)
Calcium: 8.9 mg/dL (ref 8.9–10.3)
Chloride: 105 mmol/L (ref 98–111)
Creatinine: 0.95 mg/dL (ref 0.44–1.00)
GFR, Est AFR Am: >60 mL/min (ref >60)
GFR, Estimated: >60 mL/min (ref >60)
Glucose, Bld: 114 mg/dL — ABNORMAL HIGH (ref 70–99)
Potassium: 3.8 mmol/L (ref 3.5–5.1)
Sodium: 142 mmol/L (ref 135–145)
Total Bilirubin: 0.3 mg/dL (ref 0.3–1.2)
Total Protein: 7.2 g/dL (ref 6.5–8.1)

## 2019-03-17 MED ORDER — GOSERELIN ACETATE 3.6 MG ~~LOC~~ IMPL
3.6000 mg | DRUG_IMPLANT | Freq: Once | SUBCUTANEOUS | Status: AC
  Administered 2019-03-17: 3.6 mg via SUBCUTANEOUS

## 2019-03-17 MED ORDER — DENOSUMAB 120 MG/1.7ML ~~LOC~~ SOLN
SUBCUTANEOUS | Status: AC
  Filled 2019-03-17: qty 1.7

## 2019-03-17 MED ORDER — MORPHINE SULFATE ER 15 MG PO TBCR: 15.0000 mg | EXTENDED_RELEASE_TABLET | Freq: Every day | ORAL | 0 refills | Status: DC

## 2019-03-17 MED ORDER — GABAPENTIN 300 MG PO CAPS: ORAL_CAPSULE | ORAL | 0 refills | Status: DC

## 2019-03-17 MED ORDER — DENOSUMAB 120 MG/1.7ML ~~LOC~~ SOLN
120.0000 mg | Freq: Once | SUBCUTANEOUS | Status: AC
  Administered 2019-03-17: 120 mg via SUBCUTANEOUS

## 2019-03-17 MED ORDER — GOSERELIN ACETATE 3.6 MG ~~LOC~~ IMPL
DRUG_IMPLANT | SUBCUTANEOUS | Status: AC
  Filled 2019-03-17: qty 3.6

## 2019-03-17 MED FILL — IBRANCE 125 MG TABS: 125 | 28 days supply | Qty: 21 | Fill #0

## 2019-03-17 NOTE — Patient Instructions (Signed)
Denosumab injection What is this medicine? DENOSUMAB (den oh sue mab) slows bone breakdown. Prolia is used to treat osteoporosis in women after menopause and in men, and in people who are taking corticosteroids for 6 months or more. Xgeva is used to treat a high calcium level due to cancer and to prevent bone fractures and other bone problems caused by multiple myeloma or cancer bone metastases. Xgeva is also used to treat giant cell tumor of the bone. This medicine may be used for other purposes; ask your health care provider or pharmacist if you have questions. COMMON BRAND NAME(S): Prolia, XGEVA What should I tell my health care provider before I take this medicine? They need to know if you have any of these conditions:  dental disease  having surgery or tooth extraction  infection  kidney disease  low levels of calcium or Vitamin D in the blood  malnutrition  on hemodialysis  skin conditions or sensitivity  thyroid or parathyroid disease  an unusual reaction to denosumab, other medicines, foods, dyes, or preservatives  pregnant or trying to get pregnant  breast-feeding How should I use this medicine? This medicine is for injection under the skin. It is given by a health care professional in a hospital or clinic setting. A special MedGuide will be given to you before each treatment. Be sure to read this information carefully each time. For Prolia, talk to your pediatrician regarding the use of this medicine in children. Special care may be needed. For Xgeva, talk to your pediatrician regarding the use of this medicine in children. While this drug may be prescribed for children as young as 13 years for selected conditions, precautions do apply. Overdosage: If you think you have taken too much of this medicine contact a poison control center or emergency room at once. NOTE: This medicine is only for you. Do not share this medicine with others. What if I miss a dose? It is  important not to miss your dose. Call your doctor or health care professional if you are unable to keep an appointment. What may interact with this medicine? Do not take this medicine with any of the following medications:  other medicines containing denosumab This medicine may also interact with the following medications:  medicines that lower your chance of fighting infection  steroid medicines like prednisone or cortisone This list may not describe all possible interactions. Give your health care provider a list of all the medicines, herbs, non-prescription drugs, or dietary supplements you use. Also tell them if you smoke, drink alcohol, or use illegal drugs. Some items may interact with your medicine. What should I watch for while using this medicine? Visit your doctor or health care professional for regular checks on your progress. Your doctor or health care professional may order blood tests and other tests to see how you are doing. Call your doctor or health care professional for advice if you get a fever, chills or sore throat, or other symptoms of a cold or flu. Do not treat yourself. This drug may decrease your body's ability to fight infection. Try to avoid being around people who are sick. You should make sure you get enough calcium and vitamin D while you are taking this medicine, unless your doctor tells you not to. Discuss the foods you eat and the vitamins you take with your health care professional. See your dentist regularly. Brush and floss your teeth as directed. Before you have any dental work done, tell your dentist you are   receiving this medicine. Do not become pregnant while taking this medicine or for 5 months after stopping it. Talk with your doctor or health care professional about your birth control options while taking this medicine. Women should inform their doctor if they wish to become pregnant or think they might be pregnant. There is a potential for serious side  effects to an unborn child. Talk to your health care professional or pharmacist for more information. What side effects may I notice from receiving this medicine? Side effects that you should report to your doctor or health care professional as soon as possible:  allergic reactions like skin rash, itching or hives, swelling of the face, lips, or tongue  bone pain  breathing problems  dizziness  jaw pain, especially after dental work  redness, blistering, peeling of the skin  signs and symptoms of infection like fever or chills; cough; sore throat; pain or trouble passing urine  signs of low calcium like fast heartbeat, muscle cramps or muscle pain; pain, tingling, numbness in the hands or feet; seizures  unusual bleeding or bruising  unusually weak or tired Side effects that usually do not require medical attention (report to your doctor or health care professional if they continue or are bothersome):  constipation  diarrhea  headache  joint pain  loss of appetite  muscle pain  runny nose  tiredness  upset stomach This list may not describe all possible side effects. Call your doctor for medical advice about side effects. You may report side effects to FDA at 1-800-FDA-1088. Where should I keep my medicine? This medicine is only given in a clinic, doctor's office, or other health care setting and will not be stored at home. NOTE: This sheet is a summary. It may not cover all possible information. If you have questions about this medicine, talk to your doctor, pharmacist, or health care provider.  2020 Elsevier/Gold Standard (2017-08-08 16:10:44) Goserelin injection What is this medicine? GOSERELIN (GOE se rel in) is similar to a hormone found in the body. It lowers the amount of sex hormones that the body makes. Men will have lower testosterone levels and women will have lower estrogen levels while taking this medicine. In men, this medicine is used to treat prostate  cancer; the injection is either given once per month or once every 12 weeks. A once per month injection (only) is used to treat women with endometriosis, dysfunctional uterine bleeding, or advanced breast cancer. This medicine may be used for other purposes; ask your health care provider or pharmacist if you have questions. COMMON BRAND NAME(S): Zoladex What should I tell my health care provider before I take this medicine? They need to know if you have any of these conditions:  bone problems  diabetes  heart disease  history of irregular heartbeat  an unusual or allergic reaction to goserelin, other medicines, foods, dyes, or preservatives  pregnant or trying to get pregnant  breast-feeding How should I use this medicine? This medicine is for injection under the skin. It is given by a health care professional in a hospital or clinic setting. Talk to your pediatrician regarding the use of this medicine in children. Special care may be needed. Overdosage: If you think you have taken too much of this medicine contact a poison control center or emergency room at once. NOTE: This medicine is only for you. Do not share this medicine with others. What if I miss a dose? It is important not to miss your dose. Call your   doctor or health care professional if you are unable to keep an appointment. What may interact with this medicine? Do not take this medicine with any of the following medications:  cisapride  dronedarone  pimozide  thioridazine This medicine may also interact with the following medications:  other medicines that prolong the QT interval (an abnormal heart rhythm) This list may not describe all possible interactions. Give your health care provider a list of all the medicines, herbs, non-prescription drugs, or dietary supplements you use. Also tell them if you smoke, drink alcohol, or use illegal drugs. Some items may interact with your medicine. What should I watch for  while using this medicine? Visit your doctor or health care provider for regular checks on your progress. Your symptoms may appear to get worse during the first weeks of this therapy. Tell your doctor or healthcare provider if your symptoms do not start to get better or if they get worse after this time. Your bones may get weaker if you take this medicine for a long time. If you smoke or frequently drink alcohol you may increase your risk of bone loss. A family history of osteoporosis, chronic use of drugs for seizures (convulsions), or corticosteroids can also increase your risk of bone loss. Talk to your doctor about how to keep your bones strong. This medicine should stop regular monthly menstruation in women. Tell your doctor if you continue to menstruate. Women should not become pregnant while taking this medicine or for 12 weeks after stopping this medicine. Women should inform their doctor if they wish to become pregnant or think they might be pregnant. There is a potential for serious side effects to an unborn child. Talk to your health care professional or pharmacist for more information. Do not breast-feed an infant while taking this medicine. Men should inform their doctors if they wish to father a child. This medicine may lower sperm counts. Talk to your health care professional or pharmacist for more information. This medicine may increase blood sugar. Ask your healthcare provider if changes in diet or medicines are needed if you have diabetes. What side effects may I notice from receiving this medicine? Side effects that you should report to your doctor or health care professional as soon as possible:  allergic reactions like skin rash, itching or hives, swelling of the face, lips, or tongue  bone pain  breathing problems  changes in vision  chest pain  feeling faint or lightheaded, falls  fever, chills  pain, swelling, warmth in the leg  pain, tingling, numbness in the hands  or feet  signs and symptoms of high blood sugar such as being more thirsty or hungry or having to urinate more than normal. You may also feel very tired or have blurry vision  signs and symptoms of low blood pressure like dizziness; feeling faint or lightheaded, falls; unusually weak or tired  stomach pain  swelling of the ankles, feet, hands  trouble passing urine or change in the amount of urine  unusually high or low blood pressure  unusually weak or tired Side effects that usually do not require medical attention (report to your doctor or health care professional if they continue or are bothersome):  change in sex drive or performance  changes in breast size in both males and females  changes in emotions or moods  headache  hot flashes  irritation at site where injected  loss of appetite  skin problems like acne, dry skin  vaginal dryness This list  may not describe all possible side effects. Call your doctor for medical advice about side effects. You may report side effects to FDA at 1-800-FDA-1088. Where should I keep my medicine? This drug is given in a hospital or clinic and will not be stored at home. NOTE: This sheet is a summary. It may not cover all possible information. If you have questions about this medicine, talk to your doctor, pharmacist, or health care provider.  2020 Elsevier/Gold Standard (2018-07-20 14:05:56)

## 2019-03-18 ENCOUNTER — Telehealth: Payer: Self-pay | Admitting: Hematology

## 2019-03-18 ENCOUNTER — Encounter: Payer: Self-pay | Admitting: General Practice

## 2019-03-18 LAB — CANCER ANTIGEN 27.29: CA 27.29: 48.3 U/mL — ABNORMAL HIGH (ref 0.0–38.6)

## 2019-03-18 NOTE — Progress Notes (Signed)
Caitlin Cohen  Call from patient - she requests help w getting medical records to her long term disability provider ([employer]).  Gave patient contact information for [employer] FMLA rep and also sent staff message to [place] asking her to respond.   Caitlin Shell, LCSW Clinical Social Worker Phone:  [phone] Cell:  [phone]

## 2019-03-18 NOTE — Telephone Encounter (Signed)
Scheduled appt per 12/2 los.  Spoke with pt and they are aware of the appt date and time.

## 2019-03-24 ENCOUNTER — Other Ambulatory Visit: Payer: Self-pay

## 2019-03-24 DIAGNOSIS — Z20822 Contact with and (suspected) exposure to covid-19: Secondary | ICD-10-CM

## 2019-03-26 LAB — NOVEL CORONAVIRUS, NAA: SARS-CoV-2, NAA: NOT DETECTED

## 2019-03-30 ENCOUNTER — Telehealth: Payer: Self-pay | Admitting: *Deleted

## 2019-03-30 NOTE — Telephone Encounter (Signed)
Kierstynn Joe reports "[employer] Claims Department has not received requested information for disability and I have not received payment since Nov. 11th.  Shaune Pollack [phone] ext. [phone] is the contact."  Notifying H.I[facility]M of this request previously sent to [facility].   [employer] fax number: [phone].  Shaune Pollack' s voicemail states she returns 03/31/2019.  Message left requesting needs and further information.

## 2019-04-01 ENCOUNTER — Other Ambulatory Visit: Payer: Self-pay | Admitting: Hematology

## 2019-04-01 DIAGNOSIS — R110 Nausea: Secondary | ICD-10-CM

## 2019-04-01 DIAGNOSIS — C7951 Secondary malignant neoplasm of bone: Secondary | ICD-10-CM

## 2019-04-06 ENCOUNTER — Other Ambulatory Visit: Payer: Self-pay | Admitting: Nurse Practitioner

## 2019-04-06 ENCOUNTER — Telehealth: Payer: Self-pay

## 2019-04-06 MED ORDER — OXYCODONE HCL 5 MG PO TABS: 5.0000 mg | ORAL_TABLET | Freq: Four times a day (QID) | ORAL | 0 refills | Status: DC | PRN

## 2019-04-06 NOTE — Telephone Encounter (Signed)
Patient calls for refill on Oxycodone, last filled 11-24 #[phone]

## 2019-04-08 ENCOUNTER — Other Ambulatory Visit: Payer: Self-pay | Admitting: Hematology

## 2019-04-12 ENCOUNTER — Other Ambulatory Visit: Payer: Self-pay | Admitting: Hematology

## 2019-04-13 MED FILL — IBRANCE 125 MG TABS: 125 | 28 days supply | Qty: 21 | Fill #0

## 2019-04-13 NOTE — Progress Notes (Signed)
[place]   Telephone:([phone]) [phone] Fax:(336) [phone]   Clinic Follow up Note   Patient Care Team: Patient, No Pcp Per as PCP - General ([place]) Truitt Merle, MD as Consulting Physician (Hematology) [name], MD as Consulting Physician (General Surgery) [name], MD as Attending Physician (Radiation Oncology) [name], NP as Nurse Practitioner (Nurse Practitioner)  Date of Service:  04/15/2019  CHIEF COMPLAINT: Follow upofmetastaticright breastcancer  SUMMARY OF ONCOLOGIC HISTORY: Oncology History Overview Note  Cancer Staging Malignant neoplasm of upper-inner quadrant of right breast in female, estrogen receptor positive ([place]) Staging form: Breast, AJCC 8th Edition - Clinical stage from 06/26/2017: Stage IIA (cT2, cN0, cM0, G3, ER: Positive, PR: Positive, HER2: Negative) - Signed by Truitt Merle, MD on 07/01/2017 - Pathologic stage from 07/08/2017: Stage IB (pT2, pN0, cM0, G3, ER+, PR+, HER2-, Oncotype DX score: 29) - Signed by [name], NP on 07/25/2017     Malignant neoplasm of upper-inner quadrant of right breast in female, estrogen receptor positive ([place])  06/23/2017 Mammogram   IMPRESSION: 1. There is a highly suspicious mass measuring 3.5 cm mammographically in the right breast at the palpable site identified by the patient. 2. There is 1 suspicious lymph node with cortex measuring up to 6 mm. 3.  No mammographic evidence of malignancy in the left breast.   06/26/2017 Initial Biopsy   Diagnosis 06/26/17 1. Breast, right, needle core biopsy, upper inner quadrant, 12:30 o'clock, 7cm from nipple - INVASIVE DUCTAL CARCINOMA - SEE COMMENT 2. Lymph node, needle/core biopsy, right axillary (level 2 node) - NO CARCINOMA IDENTIFIED IN ONE LYMPH NODE (0/1)   06/26/2017 Receptors her2   Prognostic indicators significant for: ER, 70% positive and PR, 70% positive, both with strong staining intensity. Proliferation marker Ki67 at  60%. HER2 negative.   07/01/2017 Initial Diagnosis   Malignant neoplasm of upper-inner quadrant of right breast in female, estrogen receptor positive ([place])   07/08/2017 Pathology Results   Diagnosis 1. Breast, lumpectomy, Right - INVASIVE DUCTAL CARCINOMA, NOTTINGHAM GRADE 3 OF 3, 3.5 CM - MARGINS UNINVOLVED BY CARCINOMA (0.1 CM, SUPERIOR MARGIN) - PREVIOUS BIOPSY SITE CHANGES - SEE ONCOLOGY TABLE BELOW 2. Soft tissue, biopsy, Axillary - BENIGN FIBROADIPOSE TISSUE - NO MALIGNANCY IDENTIFIED 3. Lymph node, sentinel, biopsy, Right axillary - NO CARCINOMA IDENTIFIED IN ONE LYMPH NODE (0/1) 4. Lymph node, sentinel, biopsy, Right axillary - NO CARCINOMA IDENTIFIED IN ONE LYMPH NODE (0/1) - PREVIOUS BIOPSY SITE CHANGES - SEE COMMENT 5. Breast, excision, Right superior margin - BENIGN BREAST TISSUE - NO RESIDUAL CARCINOMA IDENTIFIED 6. Breast, excision, Right inferior margin - BENIGN BREAST TISSUE - NO RESIDUAL CARCINOMA IDENTIFIED   07/08/2017 Cancer Staging   Staging form: Breast, AJCC 8th Edition - Pathologic stage from 07/08/2017: Stage IB (pT2, pN0, cM0, G3, ER+, PR+, HER2-, Oncotype DX score: 29) - Signed by [name], NP on 07/25/2017   07/22/2017 Oncotype testing   Recurrence Score: 29 Distant Recurrence Risk at 9 years with Ai or Tamoxifen alone: 18% Absolute Chemotherapy Benefit: > 15 %   08/08/2017 -  Chemotherapy   Adjuvant chemo Taxotere/Cytoxanq3 weeks x4 cycles with Udenyca -Granix added from cycle 2. Discontinued Udenyca following cycle 2.    11/10/2017 - 12/08/2017 Radiation Therapy   Radiation treatment dates:   11/10/2017 to 12/08/2017  Site/dose:    1. The Right breast was treated to 40.05 Gy in 15 fractions of 2.67 Gy. 2. The Right breast was boosted to 10 Gy in 5  fractions of 2 Gy.  Beams/energy:    1. 3D// 10X 2. Isodose plan w/photons // 10X, 6X  Narrative: The patient tolerated radiation treatment relatively well.   She developed moderate  hyperpigmentation to the right breast with treatment. She denies any pain or fatigue. She continues to use radiaplex twice daily as directed.    12/2017 - 11/2018 Anti-estrogen oral therapy   started Tamoxifen 89m daily in 12/2017. Stopped in 11/2018 due to cancer recurrance. Restarted in early 12/2018 and switched to Letrozole on 01/14/19.    08/11/2018 Survivorship   SCP virtual visit per LCira Rue NP    10/27/2018 Imaging   CT CAP W Contrast 10/27/18  IMPRESSION: 1. Multifocal lytic bone metastases are identified involving the left ribs, L2 vertebra, and right iliac wing. There is of pathologic fracture involving the L2 vertebra. 2. Single small, nonspecific pulmonary nodule in the right upper lobe is noted. 3. Ill-defined low-density structure in right lobe of liver measures 1.3 cm and is indeterminate. This may be better characterized with contrast enhanced liver MRI or PET-CT.   10/28/2018 Imaging   Whole Body Bone Scan 10/28/18  IMPRESSION: Sites of uptake in the left third and sixth ribs, right iliac crest and L2 vertebral body are compatible with sites of osseous metastatic disease with lesions visualized on CT from 1 day prior   Non-specific soft tissue uptake is seen at the site of prior right lumpectomy.   Likely urinary contamination in the soft tissues of the right pelvis, buttock and right flank.   11/12/2018 Relapse/Recurrence   Diagnosis 1. Bone, biopsy, Lumbar 2, Left Pedicle - BONE WITH MARROW FIBROSIS. - NO MALIGNANCY IDENTIFIED. 2. Bone, biopsy, Lumbar 2, Right Pedicle - METASTATIC CARCINOMA, SEE COMMENT.  The tumor cells are NEGATIVE for Her2 (0). Estrogen Receptor: 0%, NEGATIVE Progesterone Receptor: 0%, NEGATIVE   12/02/2018 - 12/18/2018 Radiation Therapy   L2 palliative RT (12/02/18-12/09/18) and will start palliative RT to right iliac lesion 12/10/18 and completed on 12/18/18 with Dr. SIsidore Moos   12/03/2018 Pathology Results   Diagnosis 12/03/18 Bone, biopsy,  iliac crest, right - METASTATIC CARCINOMA. Results: IMMUNOHISTOCHEMICAL AND MORPHOMETRIC ANALYSIS PERFORMED MANUALLY The tumor cells are NEGATIVE for Her2 (1+). Estrogen Receptor: 80%, POSITIVE, STRONG STAINING INTENSITY Progesterone Receptor: 20%, POSITIVE, STRONG STAINING INTENSITY   12/16/2018 -  Chemotherapy   Zometa injection q374monthstarting 12/16/18. Due to flu like symptoms this was switched to monthly Xgeva with second dose on 03/17/19    12/28/2018 -  Chemotherapy   Ibrance 3 weeks on/1 week off starting 12/28/18 Letrozole once daily starting 01/14/19 with monthly Zoledex injections starting 12/16/18.    02/16/2019 Imaging   CT CAP W Contrast  IMPRESSION: Chest.   1. New peripheral consolidation in the LEFT upper lobe with air bronchograms adjacent to lytic lesions within the third and sixth rib is favored radiation change. Recommend attention on follow-up. 2. No new or progressive breast cancer in thorax.   Abdomen / Pelvis.   1. No evidence of metastatic disease in the soft tissues of the abdomen pelvis. 2. Increase in size of lytic metastasis in the RIGHT iliac CREST. 3. Interval L2 kyphoplasty at site a pathologic fracture.     02/16/2019 Imaging   Whole Body bone scan  IMPRESSION: 1. Stable to improved uptake associated with previous left anterior rib lesions. 2. Increased uptake within known lytic metastasis involving the L2 vertebra and anterior right iliac crest. 3. No new sites of disease.      CURRENT  THERAPY:  -Zometa infusionq52month starting 12/16/18, Due to flu like symptoms will switched to monthly Xgeva with second dose in 03/2019 -Monthly Zoladex injections starting9/2/20 -Restart tamoxifen in 1 week. She switched to Letrozole with 2nd Zoladex injection10/1/20. -Ibrance 3 weeks on/1 week off starting9/14/20.  INTERVAL HISTORY:  KVenisha Boehningis here for a follow up and treatment. She presents to the clinic alone. She notes she is doing  well. She notes she takes antiemetics to make sure she does not have N&V. She notes more right pelvic pain lately. Her back pain is well controlled and doing better. She thinks radiation helped her pain. She only uses a cane when out of the house as going up steps can be difficult. She is able to ambulate independently at home. She is able to do more housework now. She denies fever, chills, cough or stomach pain. She feels she is tolerating Ibrance, Zoladex and Letrozole well. Her hot flashes are now manageable with Gabapentin nightly.  She notes itching and numbness of her right side of face. This has been going on for 2 months. She denies rash or pain. She notes normal mastication, no vision change and no headaches.    REVIEW OF SYSTEMS:   Constitutional: Denies fevers, chills or abnormal weight loss (+) Manageable hot flashes  Eyes: Denies blurriness of vision Ears, nose, mouth, throat, and face: Denies mucositis or sore throat Respiratory: Denies cough, dyspnea or wheezes Cardiovascular: Denies palpitation, chest discomfort or lower extremity swelling Gastrointestinal:  Denies nausea, heartburn or change in bowel habits Skin: Denies abnormal skin rashes MSK: (+) Manageable back pain (+) Right pelvic pain  Lymphatics: Denies new lymphadenopathy or easy bruising Neurological:Denies numbness, tingling or new weaknesses (+) right face itching and numbness  Behavioral/Psych: Mood is stable, no new changes  All other systems were reviewed with the patient and are negative.  MEDICAL HISTORY:  Past Medical History:  Diagnosis Date  . Anemia   . Anxiety   . [place]   breast cancer right  . Depression   . Diabetes mellitus without complication ([place]   . GERD (gastroesophageal reflux disease)   . Headache   . History of radiation therapy 11/10/17- 12/08/17   right breast, 40.05 Gy in 15 fractions. Right breast boost 10 Gy in 5 fractions.   . Metast[place] cancer to bone [place] [place]  09/[place]   pelvis, spine and ribs  . Personal history of chemotherapy   . Personal history of radiation therapy     SURGICAL HISTORY: Past Surgical History:  Procedure Laterality Date  . CERVICAL CONE BIOPSY    . CHOLECYSTECTOMY    . IR RADIOLOGIST EVAL & MGMT  11/03/2018  . KYPHOPLASTY N/A 11/12/2018   Procedure: LUMBAR TWO  BIOPSY AND LUMBAR TWO KYPHOPLASTY;  Surgeon: BMelina Schools MD;  [facility]  Service: Orthopedics;  Laterality: N/A;  90 mins  . MASTECTOMY WITH RADIOACTIVE SEED GUIDED EXCISION AND AXILLARY SENTINEL LYMPH NODE BIOPSY Right 07/08/2017   Procedure: RIGHT LUMPTECTOMY WITH RADIOACTIVE SEED GUIDED EXCISION AND RIGHT AXILLARY SENTINEL LYMPH NODE BIOPSY ERAS PATHWAY;  Surgeon: WRolm Bookbinder MD;  [place] [facility]  Service: General;  Laterality: Right;  PEC BLOCK  . OPEN REDUCTION INTERNAL FIXATION (ORIF) DISTAL RADIAL FRACTURE  03/17/2012   Procedure: OPEN REDUCTION INTERNAL FIXATION (ORIF) DISTAL RADIAL FRACTURE;  Surgeon: WRoseanne Kaufman MD;  [place] [facility]  Service: Orthopedics;  Laterality: Right;  Pre-operative a supra-clavical block right arm in addition to general anesthesia  .  PORT-A-CATH REMOVAL Right 02/02/2018   Procedure: REMOVAL PORT-A-CATH;  Surgeon: [name], MD;  Location: [place];  Service: General;  Laterality: Right;  . PORTACATH PLACEMENT N/A 07/30/2017   Procedure: INSERTION PORT-A-CATH WITH [place];  Surgeon: [name], MD;  Location: [place];  Service: General;  Laterality: N/A;  . TUBAL LIGATION      I have reviewed the social history and family history with the patient and they are unchanged from previous note.  ALLERGIES:  has No Known Allergies.  MEDICATIONS:  Current Outpatient Medications  Medication Sig Dispense Refill  . blood glucose meter kit and supplies KIT Dispense based on patient and insurance preference. Use up to four times daily as directed. (FOR ICD-9 250.00, 250.01). 1 each 0  . Ca &  Phos-Vit D-Mag [phone] TABS Take 1 tablet by mouth daily. 20 tablet 0  . gabapentin (NEURONTIN) 300 MG capsule Take 1 capsule morning and afternoon and 2 capsules at bedtime 270 capsule 0  . IBRANCE 125 MG tablet TAKE 1 TABLET (125 MG TOTAL) BY MOUTH DAILY. TAKE FOR 21 DAYS ON, 7 DAYS OFF, REPEAT EVERY 28 DAYS. 21 tablet 0  . KLOR-CON M20 20 MEQ tablet TAKE 1 TABLET BY MOUTH TWICE A DAY 60 tablet 1  . letrozole (FEMARA) 2.5 MG tablet Take 1 tablet (2.5 mg total) by mouth daily. 30 tablet 4  . MELATONIN ER PO Take 15 mg by mouth.    . metFORMIN (GLUCOPHAGE) 500 MG tablet Take 1 tablet (500 mg total) by mouth 2 (two) times daily with a meal. 180 tablet 0  . morphine (MS CONTIN) 15 MG 12 hr tablet Take 1 tablet (15 mg total) by mouth at bedtime. 30 tablet 0  . ondansetron (ZOFRAN) 8 MG tablet Take 1 tablet (8 mg total) by mouth every 8 (eight) hours as needed for nausea or vomiting. 30 tablet 3  . oxyCODONE (OXY IR/ROXICODONE) 5 MG immediate release tablet Take 1-2 tablets (5-10 mg total) by mouth every 6 (six) hours as needed for severe pain. 112 tablet 0  . polyethylene glycol (MIRALAX) 17 g packet Take 17 g by mouth daily as needed. (Patient taking differently: Take 17 g by mouth daily as needed for mild constipation. ) 14 each 8  . prochlorperazine (COMPAZINE) 10 MG tablet TAKE 1 TABLET (10 MG TOTAL) BY MOUTH EVERY 6 (SIX) HOURS AS NEEDED FOR NAUSEA OR VOMITING. 30 tablet 1   No current facility-administered medications for this visit.   Facility-Administered Medications Ordered in Other Visits  Medication Dose Route Frequency Provider Last Rate Last Admin  . denosumab (XGEVA) injection 120 mg  120 mg Subcutaneous Once Truitt Merle, MD      . goserelin (ZOLADEX) injection 3.6 mg  3.6 mg Subcutaneous Once Truitt Merle, MD        PHYSICAL EXAMINATION: ECOG PERFORMANCE STATUS: 2 - Symptomatic, <50% confined to bed  Vitals:   04/15/19 0833  BP: 130/85  Pulse: (!) 107  Resp: 18  Temp: 97.8  F (36.6 C)  SpO2: 98%   Filed Weights   04/15/19 0833  Weight: 284 lb (128.8 kg)    GENERAL:alert, no distress and comfortable SKIN: skin color, texture, turgor are normal, no rashes or significant lesions EYES: normal, Conjunctiva are pink and non-injected, sclera clear  NECK: supple, thyroid normal size, non-tender, without nodularity LYMPH:  no palpable lymphadenopathy in the cervical, axillary  LUNGS: clear to auscultation and percussion with normal breathing effort HEART: regular rate & rhythm  and no murmurs and no lower extremity edema ABDOMEN:abdomen soft, non-tender and normal bowel sounds Musculoskeletal:no cyanosis of digits and no clubbing  NEURO: alert & oriented x 3 with fluent speech, no focal motor/sensory deficits (+) Mild sensory deficit of right facial forehead and around eye BREAST: S/p right lumpectomy: Surgical incision healed well. No palpable mass, nodules or adenopathy bilaterally. Breast exam benign.   LABORATORY DATA:  I have reviewed the data as listed CBC Latest Ref Rng & Units 04/15/2019 03/17/2019 02/19/2019  WBC 4.0 - 10.5 K/uL 2.8(L) 2.8(L) 2.6(L)  Hemoglobin 12.0 - 15.0 g/dL 12.0 12.3 11.4(L)  Hematocrit 36.0 - 46.0 % 35.9(L) 36.6 34.8(L)  Platelets 150 - 400 K/uL 205 161 212     CMP Latest Ref Rng & Units 04/15/2019 03/17/2019 02/19/2019  Glucose 70 - 99 mg/dL 111(H) 114(H) 97  BUN 6 - 20 mg/dL 13 17 13   Creatinine 0.44 - 1.00 mg/dL 0.78 0.95 0.85  Sodium 135 - 145 mmol/L 139 142 142  Potassium 3.5 - 5.1 mmol/L 3.6 3.8 3.1(L)  Chloride 98 - 111 mmol/L 104 105 104  CO2 22 - 32 mmol/L 26 27 29   Calcium 8.9 - 10.3 mg/dL 8.8(L) 8.9 8.5(L)  Total Protein 6.5 - 8.1 g/dL 7.4 7.2 7.3  Total Bilirubin 0.3 - 1.2 mg/dL 0.4 0.3 <0.2(L)  Alkaline Phos 38 - 126 U/L 61 64 57  AST 15 - 41 U/L 16 15 15   ALT 0 - 44 U/L 9 13 8       RADIOGRAPHIC STUDIES: I have personally reviewed the radiological images as listed and agreed with the findings in the  report. No results found.   ASSESSMENT & PLAN:  Caitlin Cohen is a 53 y.o. female with   1. 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 She is s/p right breastlumpectomy, adjuvantchemoTC and adjuvant radiationand was treated with Tamoxifen since 12/2017. Due to disease progression, she stoppedon 01/14/19. -In 10/2018 she was diagnosed with cancer recurrence now metastatic to the bone confirmed on 11/2018  Second bone biopsy.  -She underwent palliative radiation to right iliac and L2 and Kyphoplasty for her cancer related bone pain. -I started systemic treatment with first line Letrozole starting 01/14/19 with monthly Zoladex injection and oral CD4K inhibitor Ibrance 3 weeks on/1 week off starting 12/28/18.  -She plans to have BSO in 2021 so she can stop Zoladex injections.  -She continues to tolerate treatment well, her pain is well controlled and performance status continues to improve. She does have recent neuropathy likely of cranial nerve effecting the right facial sensation. Will evaluate with brain MRI.  -Labs reviewed and adequate to continue treatment. Will proceed with Zoladex and Xgeva injection today.  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 -She underwent L2 Kyphoplastyand biopsy by Dr. Georgiann Mohs 11/12/18. -Her8/20/20 bone biopsy without decalcification showed her metastasis to be ER/PE positive and her2 negative.  -Shecompleted L2 palliative RT (12/02/18-12/09/18)  and palliative RIto right iliac lesion (12/10/18-12/18/18)with Dr. Isidore Moos. -She started Zometa q68month on 12/16/18.She had flu like symptoms, likely  related with her.Willswitch to [employer] in 03/2019.  -Ipreviouslycalled inGabapentin366m to help her pain. She will continue Calcium.  -S/p Kyphoplasty and radiation her back pain is manageable and right hip pain stable. She ambulates well independently and with cane outside of home. She has increased activity level.  -Since pain better controlled she has reduced MS Contin to 1 tab nightly with oxycodone 5 in the AM, as needed through the day and 5-151mat night.   3. N&V, Mild Constipation  -Her nausea is well controlled with daily Zofran and Compazine, continue.  -She has been able to eat and drink enough to maintain her weight.  -Her constipation is likely related to her pain medication use. Continue Miralax use, at least TID  -Well controlled with antiemetics.   4. Depression/Anxiety  -She has a hx of depression and was previously treated with medication. Not currently on medication and she feels that her depression is controlled.  -Mood is mostly stable on Letrozole and Zoladex.   5. Mild neutropenia -secondary to Ibrance, mild, will continue monitoring. Stable   6.Goal of care discussion  -The patient understands the goal of care is palliative. -she is full code now  7. DM -She is currently on Metformin 50053mID, diagnosed after initial chemotherapy. She is still looking for PCP to manage this.   8. Hypokalemia -On 21m60motassium, continue daily.   9. Right facial itching and numbness  -On set around 2 months ago -Exam shows mild sensory deficit around her right forehead, eye and upper check, no rash, face symmetrical. Other cranial nerve exam normal  -I will order brain MRI to rule out brain mets, although I have low suspicion. If Scan benign I will refer her to neurology.    Plan -Brain MRI in 1-2 weeks    -Proceed with Zoladex and Xgeva injection today  -Continue Zoladex monthly, Xgeva monthly, Letrozole daily, Ibrance 3 weeks on/1 week off. Next cycle starts on 1/4 -Zoladex and Xgeva in 4 and 8 weeks  -f/u in 8 weeks with lab and CT CAP W Contrast a few weeks before.    No problem-specific Assessment & Plan notes found for this encounter.   Orders Placed This Encounter  Procedures  . MR Brain W Wo Contrast    Right facial numbness for 1-2 months. Rule out brain metastasis    Standing Status:   Future    Standing Expiration Date:   04/14/2020    Order Specific Question:   If indicated for the ordered procedure, I authorize the administration of contrast media per Radiology protocol    Answer:   Yes    Order Specific Question:   What is the patient's sedation requirement?    Answer:   No Sedation    Order Specific Question:   Does the patient have a pacemaker or implanted devices?    Answer:   No    Order Specific Question:   Use SRS Protocol?    Answer:   No    Order Specific Question:   Radiology Contrast Protocol - do NOT remove file path    Answer:   \\[email or web address]    Order Specific Question:   [facility]    [facility]  . CT Abdomen Pelvis W Contrast    Standing Status:   Future    Standing Expiration Date:   04/14/2020    Order Specific Question:   If indicated for the ordered procedure, I authorize the  administration of contrast media per Radiology protocol    Answer:   Yes    Order Specific Question:   Is patient pregnant?    Answer:   No    Order Specific Question:   Preferred imaging location?    Answer:   [facility]    Order Specific Question:   Is Oral Contrast requested for this exam?    Answer:   Yes, Per Radiology protocol    Order Specific Question:   Radiology Contrast Protocol - do NOT remove file path    Answer:   \\[email or web address]  .  CT Chest W Contrast    Standing Status:   Future    Standing Expiration Date:   04/14/2020    Order Specific Question:   If indicated for the ordered procedure, I authorize the administration of contrast media per Radiology protocol    Answer:   Yes    Order Specific Question:   Is patient pregnant?    Answer:   No    Order Specific Question:   Preferred imaging location?    Answer:   [facility]    Order Specific Question:   Radiology Contrast Protocol - do NOT remove file path    Answer:   \\[email or web address]  . NM Bone Scan Whole Body    Standing Status:   Future    Standing Expiration Date:   04/14/2020    Order Specific Question:   If indicated for the ordered procedure, I authorize the administration of a radiopharmaceutical per Radiology protocol    Answer:   Yes    Order Specific Question:   Is the patient pregnant?    Answer:   No    Order Specific Question:   Preferred imaging location?    Answer:   [facility]    Order Specific Question:   Radiology Contrast Protocol - do NOT remove file path    Answer:   \\[email or web address]   All questions were answered. The patient knows to call the clinic with any problems, questions or concerns. No barriers to learning was detected. I spent 20 minutes counseling the patient face to face. The total time spent in the appointment was 25 minutes and more than 50% was on counseling and review of test results     Truitt Merle, MD 04/15/2019   I, Joslyn Devon, am acting as scribe for Truitt Merle, MD.   I have reviewed the above documentation for accuracy and completeness, and I agree with the above.

## 2019-04-14 NOTE — Telephone Encounter (Signed)
Connected with Miguel Aschoff who confirms "[place] [employer] received 800 pages of information/records".

## 2019-04-15 ENCOUNTER — Other Ambulatory Visit: Payer: PRIVATE HEALTH INSURANCE

## 2019-04-15 ENCOUNTER — Ambulatory Visit: Payer: PRIVATE HEALTH INSURANCE

## 2019-04-15 ENCOUNTER — Inpatient Hospital Stay: Payer: Medicaid Other

## 2019-04-15 ENCOUNTER — Encounter: Payer: Self-pay | Admitting: Hematology

## 2019-04-15 ENCOUNTER — Ambulatory Visit: Payer: PRIVATE HEALTH INSURANCE | Admitting: Hematology

## 2019-04-15 ENCOUNTER — Other Ambulatory Visit: Payer: Self-pay

## 2019-04-15 ENCOUNTER — Inpatient Hospital Stay (HOSPITAL_BASED_OUTPATIENT_CLINIC_OR_DEPARTMENT_OTHER): Payer: Medicaid Other | Admitting: Hematology

## 2019-04-15 VITALS — BP 130/85 | HR 107 | Temp 97.8°F | Resp 18 | Ht 67.0 in | Wt 284.0 lb

## 2019-04-15 DIAGNOSIS — C50211 Malignant neoplasm of upper-inner quadrant of right female breast: Secondary | ICD-10-CM | POA: Diagnosis not present

## 2019-04-15 DIAGNOSIS — E119 Type 2 diabetes mellitus without complications: Secondary | ICD-10-CM

## 2019-04-15 DIAGNOSIS — C7951 Secondary malignant neoplasm of bone: Secondary | ICD-10-CM

## 2019-04-15 DIAGNOSIS — Z170 Estrogen receptor positive status [ER+]: Secondary | ICD-10-CM

## 2019-04-15 DIAGNOSIS — Z95828 Presence of other vascular implants and grafts: Secondary | ICD-10-CM

## 2019-04-15 DIAGNOSIS — Z5111 Encounter for antineoplastic chemotherapy: Secondary | ICD-10-CM | POA: Diagnosis not present

## 2019-04-15 LAB — CBC WITH DIFFERENTIAL (CANCER CENTER ONLY)
Abs Immature Granulocytes: 0 10*3/uL (ref 0.00–0.07)
Basophils Absolute: 0 10*3/uL (ref 0.0–0.1)
Basophils Relative: 1 %
Eosinophils Absolute: 0 10*3/uL (ref 0.0–0.5)
Eosinophils Relative: 0 %
HCT: 35.9 % — ABNORMAL LOW (ref 36.0–46.0)
Hemoglobin: 12 g/dL (ref 12.0–15.0)
Immature Granulocytes: 0 %
Lymphocytes Relative: 33 %
Lymphs Abs: 0.9 10*3/uL (ref 0.7–4.0)
MCH: 31.9 pg (ref 26.0–34.0)
MCHC: 33.4 g/dL (ref 30.0–36.0)
MCV: 95.5 fL (ref 80.0–100.0)
Monocytes Absolute: 0.2 10*3/uL (ref 0.1–1.0)
Monocytes Relative: 7 %
Neutro Abs: 1.6 10*3/uL — ABNORMAL LOW (ref 1.7–7.7)
Neutrophils Relative %: 59 %
Platelet Count: 205 10*3/uL (ref 150–400)
RBC: 3.76 MIL/uL — ABNORMAL LOW (ref 3.87–5.11)
RDW: 17.6 % — ABNORMAL HIGH (ref 11.5–15.5)
WBC Count: 2.8 10*3/uL — ABNORMAL LOW (ref 4.0–10.5)
nRBC: 0 % (ref 0.0–0.2)

## 2019-04-15 LAB — CMP (CANCER CENTER ONLY)
ALT: 9 U/L (ref 0–44)
AST: 16 U/L (ref 15–41)
Albumin: 3.8 g/dL (ref 3.5–5.0)
Alkaline Phosphatase: 61 U/L (ref 38–126)
Anion gap: 9 (ref 5–15)
BUN: 13 mg/dL (ref 6–20)
CO2: 26 mmol/L (ref 22–32)
Calcium: 8.8 mg/dL — ABNORMAL LOW (ref 8.9–10.3)
Chloride: 104 mmol/L (ref 98–111)
Creatinine: 0.78 mg/dL (ref 0.44–1.00)
GFR, Est AFR Am: >60 mL/min (ref >60)
GFR, Estimated: >60 mL/min (ref >60)
Glucose, Bld: 111 mg/dL — ABNORMAL HIGH (ref 70–99)
Potassium: 3.6 mmol/L (ref 3.5–5.1)
Sodium: 139 mmol/L (ref 135–145)
Total Bilirubin: 0.4 mg/dL (ref 0.3–1.2)
Total Protein: 7.4 g/dL (ref 6.5–8.1)

## 2019-04-15 MED ORDER — DENOSUMAB 120 MG/1.7ML ~~LOC~~ SOLN
SUBCUTANEOUS | Status: AC
  Filled 2019-04-15: qty 1.7

## 2019-04-15 MED ORDER — GOSERELIN ACETATE 3.6 MG ~~LOC~~ IMPL
3.6000 mg | DRUG_IMPLANT | Freq: Once | SUBCUTANEOUS | Status: AC
  Administered 2019-04-15: 3.6 mg via SUBCUTANEOUS

## 2019-04-15 MED ORDER — DENOSUMAB 120 MG/1.7ML ~~LOC~~ SOLN
120.0000 mg | Freq: Once | SUBCUTANEOUS | Status: AC
  Administered 2019-04-15: 120 mg via SUBCUTANEOUS

## 2019-04-15 MED ORDER — GOSERELIN ACETATE 3.6 MG ~~LOC~~ IMPL
DRUG_IMPLANT | SUBCUTANEOUS | Status: AC
  Filled 2019-04-15: qty 3.6

## 2019-04-15 NOTE — Progress Notes (Signed)
Per MD ok to give Xgeva with Calcium of 8.8

## 2019-04-15 NOTE — Patient Instructions (Signed)
Goserelin injection What is this medicine? GOSERELIN (GOE se rel in) is similar to a hormone found in the body. It lowers the amount of sex hormones that the body makes. Men will have lower testosterone levels and women will have lower estrogen levels while taking this medicine. In men, this medicine is used to treat prostate cancer; the injection is either given once per month or once every 12 weeks. A once per month injection (only) is used to treat women with endometriosis, dysfunctional uterine bleeding, or advanced breast cancer. This medicine may be used for other purposes; ask your health care provider or pharmacist if you have questions. COMMON BRAND NAME(S): Zoladex What should I tell my health care provider before I take this medicine? They need to know if you have any of these conditions:  bone problems  diabetes  heart disease  history of irregular heartbeat  an unusual or allergic reaction to goserelin, other medicines, foods, dyes, or preservatives  pregnant or trying to get pregnant  breast-feeding How should I use this medicine? This medicine is for injection under the skin. It is given by a health care professional in a hospital or clinic setting. Talk to your pediatrician regarding the use of this medicine in children. Special care may be needed. Overdosage: If you think you have taken too much of this medicine contact a poison control center or emergency room at once. NOTE: This medicine is only for you. Do not share this medicine with others. What if I miss a dose? It is important not to miss your dose. Call your doctor or health care professional if you are unable to keep an appointment. What may interact with this medicine? Do not take this medicine with any of the following medications:  cisapride  dronedarone  pimozide  thioridazine This medicine may also interact with the following medications:  other medicines that prolong the QT interval (an abnormal  heart rhythm) This list may not describe all possible interactions. Give your health care provider a list of all the medicines, herbs, non-prescription drugs, or dietary supplements you use. Also tell them if you smoke, drink alcohol, or use illegal drugs. Some items may interact with your medicine. What should I watch for while using this medicine? Visit your doctor or health care provider for regular checks on your progress. Your symptoms may appear to get worse during the first weeks of this therapy. Tell your doctor or healthcare provider if your symptoms do not start to get better or if they get worse after this time. Your bones may get weaker if you take this medicine for a long time. If you smoke or frequently drink alcohol you may increase your risk of bone loss. A family history of osteoporosis, chronic use of drugs for seizures (convulsions), or corticosteroids can also increase your risk of bone loss. Talk to your doctor about how to keep your bones strong. This medicine should stop regular monthly menstruation in women. Tell your doctor if you continue to menstruate. Women should not become pregnant while taking this medicine or for 12 weeks after stopping this medicine. Women should inform their doctor if they wish to become pregnant or think they might be pregnant. There is a potential for serious side effects to an unborn child. Talk to your health care professional or pharmacist for more information. Do not breast-feed an infant while taking this medicine. Men should inform their doctors if they wish to father a child. This medicine may lower sperm counts. Talk  to your health care professional or pharmacist for more information. This medicine may increase blood sugar. Ask your healthcare provider if changes in diet or medicines are needed if you have diabetes. What side effects may I notice from receiving this medicine? Side effects that you should report to your doctor or health care  professional as soon as possible:  allergic reactions like skin rash, itching or hives, swelling of the face, lips, or tongue  bone pain  breathing problems  changes in vision  chest pain  feeling faint or lightheaded, falls  fever, chills  pain, swelling, warmth in the leg  pain, tingling, numbness in the hands or feet  signs and symptoms of high blood sugar such as being more thirsty or hungry or having to urinate more than normal. You may also feel very tired or have blurry vision  signs and symptoms of low blood pressure like dizziness; feeling faint or lightheaded, falls; unusually weak or tired  stomach pain  swelling of the ankles, feet, hands  trouble passing urine or change in the amount of urine  unusually high or low blood pressure  unusually weak or tired Side effects that usually do not require medical attention (report to your doctor or health care professional if they continue or are bothersome):  change in sex drive or performance  changes in breast size in both males and females  changes in emotions or moods  headache  hot flashes  irritation at site where injected  loss of appetite  skin problems like acne, dry skin  vaginal dryness This list may not describe all possible side effects. Call your doctor for medical advice about side effects. You may report side effects to FDA at 1-800-FDA-1088. Where should I keep my medicine? This drug is given in a hospital or clinic and will not be stored at home. NOTE: This sheet is a summary. It may not cover all possible information. If you have questions about this medicine, talk to your doctor, pharmacist, or health care provider.  2020 [employer]/Gold Standard (2018-07-20 14:05:56) Denosumab injection What is this medicine? DENOSUMAB (den oh sue mab) slows bone breakdown. Prolia is used to treat osteoporosis in women after menopause and in men, and in people who are taking corticosteroids for 6  months or more. Xgeva is used to treat a high calcium level due to cancer and to prevent bone fractures and other bone problems caused by multiple myeloma or cancer bone metastases. Xgeva is also used to treat giant cell tumor of the bone. This medicine may be used for other purposes; ask your health care provider or pharmacist if you have questions. COMMON BRAND NAME(S): Prolia, XGEVA What should I tell my health care provider before I take this medicine? They need to know if you have any of these conditions:  dental disease  having surgery or tooth extraction  infection  kidney disease  low levels of calcium or Vitamin D in the blood  malnutrition  on hemodialysis  skin conditions or sensitivity  thyroid or parathyroid disease  an unusual reaction to denosumab, other medicines, foods, dyes, or preservatives  pregnant or trying to get pregnant  breast-feeding How should I use this medicine? This medicine is for injection under the skin. It is given by a health care professional in a hospital or clinic setting. A special MedGuide will be given to you before each treatment. Be sure to read this information carefully each time. For Prolia, talk to your pediatrician regarding the use   of this medicine in children. Special care may be needed. For Delton See, talk to your pediatrician regarding the use of this medicine in children. While this drug may be prescribed for children as young as 13 years for selected conditions, precautions do apply. Overdosage: If you think you have taken too much of this medicine contact a poison control center or emergency room at once. NOTE: This medicine is only for you. Do not share this medicine with others. What if I miss a dose? It is important not to miss your dose. Call your doctor or health care professional if you are unable to keep an appointment. What may interact with this medicine? Do not take this medicine with any of the following  medications:  other medicines containing denosumab This medicine may also interact with the following medications:  medicines that lower your chance of fighting infection  steroid medicines like prednisone or cortisone This list may not describe all possible interactions. Give your health care provider a list of all the medicines, herbs, non-prescription drugs, or dietary supplements you use. Also tell them if you smoke, drink alcohol, or use illegal drugs. Some items may interact with your medicine. What should I watch for while using this medicine? Visit your doctor or health care professional for regular checks on your progress. Your doctor or health care professional may order blood tests and other tests to see how you are doing. Call your doctor or health care professional for advice if you get a fever, chills or sore throat, or other symptoms of a cold or flu. Do not treat yourself. This drug may decrease your body's ability to fight infection. Try to avoid being around people who are sick. You should make sure you get enough calcium and vitamin D while you are taking this medicine, unless your doctor tells you not to. Discuss the foods you eat and the vitamins you take with your health care professional. See your dentist regularly. Brush and floss your teeth as directed. Before you have any dental work done, tell your dentist you are receiving this medicine. Do not become pregnant while taking this medicine or for 5 months after stopping it. Talk with your doctor or health care professional about your birth control options while taking this medicine. Women should inform their doctor if they wish to become pregnant or think they might be pregnant. There is a potential for serious side effects to an unborn child. Talk to your health care professional or pharmacist for more information. What side effects may I notice from receiving this medicine? Side effects that you should report to your doctor  or health care professional as soon as possible:  allergic reactions like skin rash, itching or hives, swelling of the face, lips, or tongue  bone pain  breathing problems  dizziness  jaw pain, especially after dental work  redness, blistering, peeling of the skin  signs and symptoms of infection like fever or chills; cough; sore throat; pain or trouble passing urine  signs of low calcium like fast heartbeat, muscle cramps or muscle pain; pain, tingling, numbness in the hands or feet; seizures  unusual bleeding or bruising  unusually weak or tired Side effects that usually do not require medical attention (report to your doctor or health care professional if they continue or are bothersome):  constipation  diarrhea  headache  joint pain  loss of appetite  muscle pain  runny nose  tiredness  upset stomach This list may not describe all possible side  effects. Call your doctor for medical advice about side effects. You may report side effects to FDA at 1-800-FDA-1088. Where should I keep my medicine? This medicine is only given in a clinic, doctor's office, or other health care setting and will not be stored at home. NOTE: This sheet is a summary. It may not cover all possible information. If you have questions about this medicine, talk to your doctor, pharmacist, or health care provider.  2020 [employer]/Gold Standard (2017-08-08 16:10:44)

## 2019-04-16 LAB — CANCER ANTIGEN 27.29: CA 27.29: 56.9 U/mL — ABNORMAL HIGH (ref 0.0–38.6)

## 2019-04-19 ENCOUNTER — Telehealth: Payer: Self-pay | Admitting: Hematology

## 2019-04-19 NOTE — Telephone Encounter (Signed)
Scheduled appt per 12/31 los. ° °Sent a message to HIM pool to get a calendar mailed out. °

## 2019-04-20 ENCOUNTER — Other Ambulatory Visit: Payer: Self-pay | Admitting: Nurse Practitioner

## 2019-04-20 ENCOUNTER — Telehealth: Payer: Self-pay

## 2019-04-20 DIAGNOSIS — R110 Nausea: Secondary | ICD-10-CM

## 2019-04-20 MED ORDER — MORPHINE SULFATE ER 15 MG PO TBCR: 15.0000 mg | EXTENDED_RELEASE_TABLET | Freq: Every day | ORAL | 0 refills | Status: DC

## 2019-04-20 NOTE — Telephone Encounter (Signed)
Ms Lagnese called requesting MS contin refill.  Forwarded request to [name], NP

## 2019-04-20 NOTE — Telephone Encounter (Signed)
Refilled.  Thanks, LB

## 2019-04-21 ENCOUNTER — Encounter (INDEPENDENT_AMBULATORY_CARE_PROVIDER_SITE_OTHER): Payer: Self-pay

## 2019-04-21 ENCOUNTER — Other Ambulatory Visit: Payer: Self-pay | Admitting: Hematology

## 2019-04-22 ENCOUNTER — Ambulatory Visit (HOSPITAL_COMMUNITY): Payer: Medicaid Other

## 2019-04-28 ENCOUNTER — Encounter (INDEPENDENT_AMBULATORY_CARE_PROVIDER_SITE_OTHER): Payer: Self-pay

## 2019-04-29 ENCOUNTER — Telehealth: Payer: Self-pay | Admitting: Hematology

## 2019-04-29 ENCOUNTER — Other Ambulatory Visit: Payer: Self-pay | Admitting: Hematology

## 2019-04-29 ENCOUNTER — Other Ambulatory Visit: Payer: Self-pay

## 2019-04-29 ENCOUNTER — Ambulatory Visit (HOSPITAL_COMMUNITY)
Admission: RE | Admit: 2019-04-29 | Discharge: 2019-04-29 | Disposition: A | Discharge status: Home / Self Care | Payer: Medicaid Other | Source: Ambulatory Visit | Attending: Hematology | Admitting: Hematology

## 2019-04-29 ENCOUNTER — Other Ambulatory Visit: Payer: Self-pay | Admitting: Radiation Therapy

## 2019-04-29 ENCOUNTER — Telehealth: Payer: Self-pay | Admitting: *Deleted

## 2019-04-29 DIAGNOSIS — Z170 Estrogen receptor positive status [ER+]: Secondary | ICD-10-CM | POA: Insufficient documentation

## 2019-04-29 DIAGNOSIS — C50211 Malignant neoplasm of upper-inner quadrant of right female breast: Secondary | ICD-10-CM | POA: Diagnosis present

## 2019-04-29 MED ORDER — GABAPENTIN 300 MG PO CAPS: ORAL_CAPSULE | ORAL | 3 refills | Status: DC

## 2019-04-29 MED ORDER — GADOBUTROL 1 MMOL/ML IV SOLN
10.0000 mL | Freq: Once | INTRAVENOUS | Status: AC | PRN
  Administered 2019-04-29: 09:00:00 10 mL via INTRAVENOUS

## 2019-04-29 NOTE — Telephone Encounter (Signed)
Reports of Brain MRI done today printed and gave to Dr. Burr Medico for review.

## 2019-04-29 NOTE — Telephone Encounter (Signed)
I called pt and reviewed her brain MRI findings. 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 I reviewed with Dr. Mickeal Skinner, and will refer to him. Pt has stable numbness at right side face, no headache and other new neurological symptoms. She agrees to see Dr. Mickeal Skinner.   I refilled her gabapentin for her today.   Caitlin Cohen  04/29/2019

## 2019-04-30 ENCOUNTER — Telehealth: Payer: Self-pay

## 2019-04-30 NOTE — Telephone Encounter (Signed)
Pt called requesting a refill for oxycodone.

## 2019-05-02 ENCOUNTER — Other Ambulatory Visit: Payer: Self-pay | Admitting: Hematology

## 2019-05-02 MED ORDER — OXYCODONE HCL 5 MG PO TABS: 5.0000 mg | ORAL_TABLET | Freq: Four times a day (QID) | ORAL | 0 refills | Status: DC | PRN

## 2019-05-02 NOTE — Telephone Encounter (Signed)
I called her and reviewed her pain medications. She agrees to continue MS contin 15mg  at night only, and oxycodone 5mg  2-3 tabs daily (reduce from 2 tabs to 1 tab at night , and OK to take up to 2 tabs during day). She may try to reduce MS Contin to every other night later this month. I refilled oxycodone 5mg  #90 today.   Truitt Merle MD

## 2019-05-04 ENCOUNTER — Other Ambulatory Visit: Payer: Self-pay

## 2019-05-04 ENCOUNTER — Inpatient Hospital Stay: Payer: Medicaid Other | Attending: Hematology | Admitting: Internal Medicine

## 2019-05-04 DIAGNOSIS — Z9013 Acquired absence of bilateral breasts and nipples: Secondary | ICD-10-CM | POA: Insufficient documentation

## 2019-05-04 DIAGNOSIS — Z170 Estrogen receptor positive status [ER+]: Secondary | ICD-10-CM | POA: Diagnosis not present

## 2019-05-04 DIAGNOSIS — Z79811 Long term (current) use of aromatase inhibitors: Secondary | ICD-10-CM | POA: Insufficient documentation

## 2019-05-04 DIAGNOSIS — C50211 Malignant neoplasm of upper-inner quadrant of right female breast: Secondary | ICD-10-CM | POA: Insufficient documentation

## 2019-05-04 DIAGNOSIS — Z923 Personal history of irradiation: Secondary | ICD-10-CM | POA: Insufficient documentation

## 2019-05-04 DIAGNOSIS — C7951 Secondary malignant neoplasm of bone: Secondary | ICD-10-CM | POA: Insufficient documentation

## 2019-05-04 DIAGNOSIS — Z79899 Other long term (current) drug therapy: Secondary | ICD-10-CM | POA: Insufficient documentation

## 2019-05-04 DIAGNOSIS — G939 Disorder of brain, unspecified: Secondary | ICD-10-CM | POA: Insufficient documentation

## 2019-05-04 DIAGNOSIS — Z5111 Encounter for antineoplastic chemotherapy: Secondary | ICD-10-CM | POA: Diagnosis present

## 2019-05-04 DIAGNOSIS — G9389 Other specified disorders of brain: Secondary | ICD-10-CM | POA: Diagnosis not present

## 2019-05-04 NOTE — Progress Notes (Signed)
[facility] [place] [place], [place] [address] [phone]   New Patient Evaluation  Date of Service: 05/04/19 Patient Name: Caitlin Cohen Patient MRN: [id number] Patient DOB: 1965/07/21 Provider: Ventura Sellers, MD  Identifying Statement:  Aleda Madl is a 54 y.o. female with brain mass who presents for initial consultation and evaluation regarding cancer associated neurologic deficits.    Referring Provider: Truitt Merle, [address] [place],  [place] [address]  Primary Cancer:  Oncologic History: Oncology History Overview Note  Cancer Staging Malignant neoplasm of upper-inner quadrant of right breast in female, estrogen receptor positive ([place]) Staging form: Breast, AJCC 8th Edition - Clinical stage from 06/26/2017: Stage IIA (cT2, cN0, cM0, G3, ER: Positive, PR: Positive, HER2: Negative) - Signed by Truitt Merle, MD on 07/01/2017 - Pathologic stage from 07/08/2017: Stage IB (pT2, pN0, cM0, G3, ER+, PR+, HER2-, Oncotype DX score: 29) - Signed by [name], NP on 07/25/2017     Malignant neoplasm of upper-inner quadrant of right breast in female, estrogen receptor positive ([place])  06/23/2017 Mammogram   IMPRESSION: 1. There is a highly suspicious mass measuring 3.5 cm mammographically in the right breast at the palpable site identified by the patient. 2. There is 1 suspicious lymph node with cortex measuring up to 6 mm. 3.  No mammographic evidence of malignancy in the left breast.   06/26/2017 Initial Biopsy   Diagnosis 06/26/17 1. Breast, right, needle core biopsy, upper inner quadrant, 12:30 o'clock, 7cm from nipple - INVASIVE DUCTAL CARCINOMA - SEE COMMENT 2. Lymph node, needle/core biopsy, right axillary (level 2 node) - NO CARCINOMA IDENTIFIED IN ONE LYMPH NODE (0/1)   06/26/2017 Receptors her2   Prognostic indicators significant for: ER, 70% positive and PR, 70% positive, both with strong staining  intensity. Proliferation marker Ki67 at 60%. HER2 negative.   07/01/2017 Initial Diagnosis   Malignant neoplasm of upper-inner quadrant of right breast in female, estrogen receptor positive ([place])   07/08/2017 Pathology Results   Diagnosis 1. Breast, lumpectomy, Right - INVASIVE DUCTAL CARCINOMA, NOTTINGHAM GRADE 3 OF 3, 3.5 CM - MARGINS UNINVOLVED BY CARCINOMA (0.1 CM, SUPERIOR MARGIN) - PREVIOUS BIOPSY SITE CHANGES - SEE ONCOLOGY TABLE BELOW 2. Soft tissue, biopsy, Axillary - BENIGN FIBROADIPOSE TISSUE - NO MALIGNANCY IDENTIFIED 3. Lymph node, sentinel, biopsy, Right axillary - NO CARCINOMA IDENTIFIED IN ONE LYMPH NODE (0/1) 4. Lymph node, sentinel, biopsy, Right axillary - NO CARCINOMA IDENTIFIED IN ONE LYMPH NODE (0/1) - PREVIOUS BIOPSY SITE CHANGES - SEE COMMENT 5. Breast, excision, Right superior margin - BENIGN BREAST TISSUE - NO RESIDUAL CARCINOMA IDENTIFIED 6. Breast, excision, Right inferior margin - BENIGN BREAST TISSUE - NO RESIDUAL CARCINOMA IDENTIFIED   07/08/2017 Cancer Staging   Staging form: Breast, AJCC 8th Edition - Pathologic stage from 07/08/2017: Stage IB (pT2, pN0, cM0, G3, ER+, PR+, HER2-, Oncotype DX score: 29) - Signed by [name], NP on 07/25/2017   07/22/2017 Oncotype testing   Recurrence Score: 29 Distant Recurrence Risk at 9 years with Ai or Tamoxifen alone: 18% Absolute Chemotherapy Benefit: > 15 %   08/08/2017 -  Chemotherapy   Adjuvant chemo Taxotere/Cytoxanq3 weeks x4 cycles with Udenyca -Granix added from cycle 2. Discontinued Udenyca following cycle 2.    11/10/2017 - 12/08/2017 Radiation Therapy   Radiation treatment dates:   11/10/2017 to 12/08/2017  Site/dose:    1. The Right breast was treated to 40.05 Gy in 15 fractions of  2.67 Gy. 2. The Right breast was boosted to 10 Gy in 5 fractions of 2 Gy.  Beams/energy:    1. 3D// 10X 2. Isodose plan w/photons // 10X, 6X  Narrative: The patient tolerated radiation treatment relatively  well.   She developed moderate hyperpigmentation to the right breast with treatment. She denies any pain or fatigue. She continues to use radiaplex twice daily as directed.    12/2017 - 11/2018 Anti-estrogen oral therapy   started Tamoxifen '20mg'$  daily in 12/2017. Stopped in 11/2018 due to cancer recurrance. Restarted in early 12/2018 and switched to Letrozole on 01/14/19.    08/11/2018 Survivorship   SC[facility] virtual visit per [name], NP    10/27/2018 Imaging   CT CAP W Contrast 10/27/18  IMPRESSION: 1. Multifocal lytic bone metastases are identified involving the left ribs, L2 vertebra, and right iliac wing. There is of pathologic fracture involving the L2 vertebra. 2. Single small, nonspecific pulmonary nodule in the right upper lobe is noted. 3. Ill-defined low-density structure in right lobe of liver measures 1.3 cm and is indeterminate. This may be better characterized with contrast enhanced liver MRI or PET-CT.   10/28/2018 Imaging   Whole Body Bone Scan 10/28/18  IMPRESSION: Sites of uptake in the left third and sixth ribs, right iliac crest and L2 vertebral body are compatible with sites of osseous metastatic disease with lesions visualized on CT from 1 day prior   Non-specific soft tissue uptake is seen at the site of prior right lumpectomy.   Likely urinary contamination in the soft tissues of the right pelvis, buttock and right flank.   11/12/2018 Relapse/Recurrence   Diagnosis 1. Bone, biopsy, Lumbar 2, Left Pedicle - BONE WITH MARROW FIBROSIS. - NO MALIGNANCY IDENTIFIED. 2. Bone, biopsy, Lumbar 2, Right Pedicle - METASTATIC CARCINOMA, SEE COMMENT.  The tumor cells are NEGATIVE for Her2 (0). Estrogen Receptor: 0%, NEGATIVE Progesterone Receptor: 0%, NEGATIVE   12/02/2018 - 12/18/2018 Radiation Therapy   L2 palliative RT (12/02/18-12/09/18) and will start palliative RT to right iliac lesion 12/10/18 and completed on 12/18/18 with Dr. Isidore Moos.   12/03/2018 Pathology Results    Diagnosis 12/03/18 Bone, biopsy, iliac crest, right - METASTATIC CARCINOMA. Results: IMMUNOHISTOCHEMICAL AND MORPHOMETRIC ANALYSIS PERFORMED MANUALLY The tumor cells are NEGATIVE for Her2 (1+). Estrogen Receptor: 80%, POSITIVE, STRONG STAINING INTENSITY Progesterone Receptor: 20%, POSITIVE, STRONG STAINING INTENSITY   12/16/2018 -  Chemotherapy   Zometa injection q69month starting 12/16/18. Due to flu like symptoms this was switched to monthly Xgeva with second dose on 03/17/19    12/28/2018 -  Chemotherapy   Ibrance 3 weeks on/1 week off starting 12/28/18 Letrozole once daily starting 01/14/19 with monthly Zoledex injections starting 12/16/18.    02/16/2019 Imaging   CT CAP W Contrast  IMPRESSION: Chest.   1. New peripheral consolidation in the LEFT upper lobe with air bronchograms adjacent to lytic lesions within the third and sixth rib is favored radiation change. Recommend attention on follow-up. 2. No new or progressive breast cancer in thorax.   Abdomen / Pelvis.   1. No evidence of metastatic disease in the soft tissues of the abdomen pelvis. 2. Increase in size of lytic metastasis in the RIGHT iliac CREST. 3. Interval L2 kyphoplasty at site a pathologic fracture.     02/16/2019 Imaging   Whole Body bone scan  IMPRESSION: 1. Stable to improved uptake associated with previous left anterior rib lesions. 2. Increased uptake within known lytic metastasis involving the L2 vertebra and anterior right iliac  crest. 3. No new sites of disease.     History of Present Illness: The patient's records from the referring physician were obtained and reviewed and the patient interviewed to confirm this HPI.  Charese Abundis presents today to review recent abnormal findings on brain imaging.  She describes ~6 months history of sensory changes involving right arm and leg.  This is described as "loss of sensation" type numbness, rather than pins and needles.  At times the right side of the  face is involved as well, although it is primarily the arm/trunk/leg.  No issues with gait or balance aside from rare episodes of vertigo-type dizziness.  No slurred speech or difficulty swallowing.  Denies headaches or seizures.  Medications: Current Outpatient Medications on File Prior to Visit  Medication Sig Dispense Refill  . blood glucose meter kit and supplies KIT Dispense based on patient and insurance preference. Use up to four times daily as directed. (FOR ICD-9 250.00, 250.01). 1 each 0  . Ca & Phos-Vit D-Mag [phone] TABS Take 1 tablet by mouth daily. 20 tablet 0  . gabapentin (NEURONTIN) 300 MG capsule Take 1 capsule morning and afternoon and 2 capsules at bedtime 270 capsule 3  . IBRANCE 125 MG tablet TAKE 1 TABLET (125 MG TOTAL) BY MOUTH DAILY. TAKE FOR 21 DAYS ON, 7 DAYS OFF, REPEAT EVERY 28 DAYS. 21 tablet 0  . letrozole (FEMARA) 2.5 MG tablet TAKE 1 TABLET BY MOUTH EVERY DAY 90 tablet 1  . MELATONIN ER PO Take 15 mg by mouth.    . metFORMIN (GLUCOPHAGE) 500 MG tablet Take 1 tablet (500 mg total) by mouth 2 (two) times daily with a meal. 180 tablet 0  . morphine (MS CONTIN) 15 MG 12 hr tablet Take 1 tablet (15 mg total) by mouth at bedtime. 30 tablet 0  . ondansetron (ZOFRAN) 8 MG tablet TAKE 1 TABLET (8 MG TOTAL) BY MOUTH EVERY 8 (EIGHT) HOURS AS NEEDED FOR NAUSEA OR VOMITING. 30 tablet 3  . oxyCODONE (OXY IR/ROXICODONE) 5 MG immediate release tablet Take 1 tablet (5 mg total) by mouth every 6 (six) hours as needed for severe pain. 90 tablet 0  . polyethylene glycol (MIRALAX) 17 g packet Take 17 g by mouth daily as needed. (Patient taking differently: Take 17 g by mouth daily as needed for mild constipation. ) 14 each 8  . potassium chloride SA (KLOR-CON M20) 20 MEQ tablet Take 1 tablet (20 mEq total) by mouth daily. 90 tablet 1  . prochlorperazine (COMPAZINE) 10 MG tablet TAKE 1 TABLET (10 MG TOTAL) BY MOUTH EVERY 6 (SIX) HOURS AS NEEDED FOR NAUSEA OR VOMITING. 30 tablet 1     No current facility-administered medications on file prior to visit.    Allergies: No Known Allergies Past Medical History:  Past Medical History:  Diagnosis Date  . Anemia   . Anxiety   . Cancer ([place]) dx'd 06/2017   breast cancer right  . Depression   . Diabetes mellitus without complication ([place])   . GERD (gastroesophageal reflux disease)   . Headache   . History of radiation therapy 11/10/17- 12/08/17   right breast, 40.05 Gy in 15 fractions. Right breast boost 10 Gy in 5 fractions.   . Metastatic cancer to bone ([place]) dx'd 09/2018   pelvis, spine and ribs  . Personal history of chemotherapy   . Personal history of radiation therapy    Past Surgical History:  Past Surgical History:  Procedure Laterality Date  . CERVICAL  CONE BIOPSY    . CHOLECYSTECTOMY    . IR RADIOLOGIST EVAL & MGMT  11/03/2018  . KYPHOPLASTY N/A 11/12/2018   Procedure: LUMBAR TWO  BIOPSY AND LUMBAR TWO KYPHOPLASTY;  Surgeon: Melina Schools, MD;  Location: [facility];  Service: Orthopedics;  Laterality: N/A;  90 mins  . MASTECTOMY WITH RADIOACTIVE SEED GUIDED EXCISION AND AXILLARY SENTINEL LYMPH NODE BIOPSY Right 07/08/2017   Procedure: RIGHT LUMPTECTOMY WITH RADIOACTIVE SEED GUIDED EXCISION AND RIGHT AXILLARY SENTINEL LYMPH NODE BIOPSY ERAS PATHWAY;  Surgeon: [name], MD;  Location: [facility];  Service: General;  Laterality: Right;  PEC BLOCK  . OPEN REDUCTION INTERNAL FIXATION (ORIF) DISTAL RADIAL FRACTURE  03/17/2012   Procedure: OPEN REDUCTION INTERNAL FIXATION (ORIF) DISTAL RADIAL FRACTURE;  Surgeon: Roseanne Kaufman, MD;  Location: [facility];  Service: Orthopedics;  Laterality: Right;  Pre-operative a supra-clavical block right arm in addition to general anesthesia  . PORT-A-CATH REMOVAL Right 02/02/2018   Procedure: REMOVAL PORT-A-CATH;  Surgeon: [name], MD;  Location: [facility];  Service: General;  Laterality: Right;  . PORTACATH PLACEMENT N/A 07/30/2017   Procedure: INSERTION  PORT-A-CATH WITH [place];  Surgeon: [name], MD;  Location: [facility];  Service: General;  Laterality: N/A;  . TUBAL LIGATION     Social History:  Social History   Socioeconomic History  . Marital status: Single    Spouse name: Not on file  . Number of children: Not on file  . Years of education: Not on file  . Highest education level: Not on file  Occupational History  . Not on file  Tobacco Use  . Smoking status: Never Smoker  . Smokeless tobacco: Never Used  Substance and Sexual Activity  . Alcohol use: Yes    Comment: occassionally  . Drug use: No  . Sexual activity: Yes    Birth control/protection: Surgical  Other Topics Concern  . Not on file  Social History Narrative   Lives with roommate   Works fulltime but seasonal work   Social Determinants of Radio broadcast assistant Strain:   . Difficulty of Paying Living Expenses: Not on file  Food Insecurity:   . Worried About Charity fundraiser in the Last Year: Not on file  . Ran Out of Food in the Last Year: Not on file  Transportation Needs:   . Lack of Transportation (Medical): Not on file  . Lack of Transportation (Non-Medical): Not on file  Physical Activity:   . Days of Exercise per Week: Not on file  . Minutes of Exercise per Session: Not on file  Stress:   . Feeling of Stress : Not on file  Social Connections:   . Frequency of Communication with Friends and Family: Not on file  . Frequency of Social Gatherings with Friends and Family: Not on file  . Attends Religious Services: Not on file  . Active Member of Clubs or Organizations: Not on file  . Attends Archivist Meetings: Not on file  . Marital Status: Not on file  Intimate Partner Violence:   . Fear of Current or Ex-Partner: Not on file  . Emotionally Abused: Not on file  . Physically Abused: Not on file  . Sexually Abused: Not on file   Family History:  Family History  Problem Relation Age of Onset  . Hypertension Mother   .  Diabetes Father   . Hypertension Father     Review of Systems: Constitutional: Doesn't report fevers, chills or abnormal  weight loss Eyes: Doesn't report blurriness of vision Ears, nose, mouth, throat, and face: Doesn't report sore throat Respiratory: Doesn't report cough, dyspnea or wheezes Cardiovascular: Doesn't report palpitation, chest discomfort  Gastrointestinal:  Doesn't report nausea, constipation, diarrhea GU: Doesn't report incontinence Skin: Doesn't report skin rashes Neurological: Per HPI Musculoskeletal: Doesn't report joint pain Behavioral/Psych: Doesn't report anxiety  Physical Exam: Vitals:   05/04/19 1017  BP: (!) 145/99  Pulse: 93  Resp: 17  Temp: 98 F (36.7 C)  SpO2: 99%   KPS: 90. General: Alert, cooperative, pleasant, in no acute distress Head: Normal EENT: No conjunctival injection or scleral icterus.  Lungs: Resp effort normal Cardiac: Regular rate Abdomen: Non-distended abdomen Skin: No rashes cyanosis or petechiae. Extremities: No clubbing or edema  Neurologic Exam: Mental Status: Awake, alert, attentive to examiner. Oriented to self and environment. Language is fluent with intact comprehension.  Cranial Nerves: Visual acuity is grossly normal. Visual fields are full. Extra-ocular movements intact. No ptosis. Face is symmetric Motor: Tone and bulk are normal. Power is full in both arms and legs. Reflexes are symmetric, no pathologic reflexes present.  Sensory: Decreased right arm and leg Gait: Normal.   Labs: I have reviewed the data as listed    Component Value Date/Time   NA 139 04/15/2019 0816   K 3.6 04/15/2019 0816   CL 104 04/15/2019 0816   CO2 26 04/15/2019 0816   GLUCOSE 111 (H) 04/15/2019 0816   BUN 13 04/15/2019 0816   CREATININE 0.78 04/15/2019 0816   CALCIUM 8.8 (L) 04/15/2019 0816   CALCIUM 6.9 (LL) 12/22/2018 1610   PROT 7.4 04/15/2019 0816   ALBUMIN 3.8 04/15/2019 0816   AST 16 04/15/2019 0816   ALT 9 04/15/2019  0816   ALKPHOS 61 04/15/2019 0816   BILITOT 0.4 04/15/2019 0816   GFRNONAA >60 04/15/2019 0816   GFRAA >60 04/15/2019 0816   Lab Results  Component Value Date   WBC 2.8 (L) 04/15/2019   NEUTROABS 1.6 (L) 04/15/2019   HGB 12.0 04/15/2019   HCT 35.9 (L) 04/15/2019   MCV 95.5 04/15/2019   PLT 205 04/15/2019    Imaging:  MR Brain W Wo Contrast  Result Date: 04/29/2019 CLINICAL DATA:  History of right-sided breast cancer. Numbness and tingling with paresthesia of the right face, 1-2 months duration. EXAM: MRI HEAD WITHOUT AND WITH CONTRAST TECHNIQUE: Multiplanar, multiecho pulse sequences of the brain and surrounding structures were obtained without and with intravenous contrast. CONTRAST:  5m GADAVIST GADOBUTROL 1 MMOL/ML IV SOLN COMPARISON:  Head CT 09/24/2006 FINDINGS: Brain: Within the inferior fourth ventricle, extending out the foramen of Luschka on the right, there is a 1.9 x 1.2 x 0.8 cm mass with internal cystic change and irregular peripheral enhancement. 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 Sub ependymoma is possible, but those do not usually have as much cystic change. This would be an unusual metastatic lesion. Elsewhere, the brain has normal appearance. No sign of old or acute infarction. No sign of ordinary metastatic disease. No hydrocephalus. No extra-axial collection. Vascular: Major vessels at the base of the brain show flow. Skull and upper cervical spine: Negative Sinuses/Orbits: Clear except for some mucosal inflammatory changes and a retention cyst of the left maxillary sinus. Orbits negative. Other: None IMPRESSION: 1.9 x 1.2 x 0.8 cm mass of the inferior fourth ventricle, extending at the foramen of Luschka on the right. Findings most worrisome for ependymoma. See above discussion. These results will be called to the  ordering clinician or representative by the Radiologist Assistant, and communication documented in the PACS or zVision Dashboard.  Electronically Signed   By: Nelson Chimes M.D.   On: 04/29/2019 09:41     Assessment/Plan Brain Mass  Ms. Goold present with clinical and radiographic syndrome localizing to the right posterior medulla.  Etiology is either primary CNS neoplasm or metastatic process secondary to known stage IV breast cancer.  Although metastasis is more common/likely, long duration of stable symptoms and certain radiographic features are less consistent.  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  We discussed her case in brain/spine tumor board this week, and surgical resection was recommended.  We discussed this with Ms. Palomo and explained the difficulty of treating such a lesion without clear histologic guidance.  She expressed understanding and willingness to proceed with surgery.  Consultation will be scheduled with neurosurgeon Emelda Brothers.  We spent twenty additional minutes teaching regarding the natural history, biology, and historical experience in the treatment of neurologic complications of cancer.   We appreciate the opportunity to participate in the care of Miguel Aschoff.  We will continue to follow along with her as plans for surgery are put into place.  All questions were answered. The patient knows to call the clinic with any problems, questions or concerns. No barriers to learning were detected.  The total time spent in the encounter was 40 minutes and more than 50% was on counseling and review of test results   Ventura Sellers, MD Medical Director of Neuro-Oncology [facility] 05/04/19 10:16 AM

## 2019-05-05 ENCOUNTER — Encounter (INDEPENDENT_AMBULATORY_CARE_PROVIDER_SITE_OTHER): Payer: Self-pay

## 2019-05-06 ENCOUNTER — Other Ambulatory Visit: Payer: Self-pay | Admitting: Radiation Therapy

## 2019-05-06 ENCOUNTER — Other Ambulatory Visit: Payer: Self-pay | Admitting: Neurological Surgery

## 2019-05-06 ENCOUNTER — Telehealth: Payer: Self-pay

## 2019-05-06 NOTE — Telephone Encounter (Signed)
Please let her stop Ibrance one week before surgery, and restart one week after surgery. Continue Letrozole. Thanks   Truitt Merle MD

## 2019-05-06 NOTE — Telephone Encounter (Signed)
Patient calls to let Dr. Burr Medico know that she is scheduled for surgery for her brain tumor on 05/17/2019.

## 2019-05-07 ENCOUNTER — Telehealth: Payer: Self-pay

## 2019-05-07 NOTE — Telephone Encounter (Signed)
Spoke with patient regarding upcoming surgery for brain tumor, per Dr. Burr Medico informed her to stope Ibrance one week before surgery and not to resume until one week after surgery.  Continue Letrozole.  She verbalized an understanding.

## 2019-05-11 ENCOUNTER — Other Ambulatory Visit: Payer: Self-pay | Admitting: Hematology

## 2019-05-12 ENCOUNTER — Encounter (INDEPENDENT_AMBULATORY_CARE_PROVIDER_SITE_OTHER): Payer: Self-pay

## 2019-05-12 NOTE — Pre-Procedure Instructions (Signed)
[id number] [place], [place] [id number] [place] [id number] Phone: [phone] Fax: [phone]  [place], [place] [address] Phone: [phone] Fax: [phone]     Your procedure is scheduled on Monday February 1st.  Report to [facility]" at 6:00 A.M., and check in at the Admitting office.  Call this number if you have problems the morning of surgery:  [phone]  Call [phone] if you have any questions prior to your surgery date Monday-Friday 8am-4pm    Remember:  Do not eat or drink after midnight the night before your surgery     Take these medicines the morning of surgery with A SIP OF WATER  gabapentin (NEURONTIN)  letrozole (FEMARA)  ondansetron (ZOFRAN) if needed oxyCODONE (OXY IR/ROXICODONE)- if needed prochlorperazine (COMPAZINE)- if needed  As of today,  STOP taking any Aspirin (unless otherwise instructed by your surgeon), Aleve, Naproxen, Ibuprofen, Motrin, Advil, Goody's, BC's, all herbal medications, fish oil, and all vitamins.   HOW TO MANAGE YOUR DIABETES BEFORE AND AFTER SURGERY  Why is it important to control my blood sugar before and after surgery? . Improving blood sugar levels before and after surgery helps healing and can limit problems. . A way of improving blood sugar control is eating a healthy diet by: o  Eating less sugar and carbohydrates o  Increasing activity/exercise o  Talking with your doctor about reaching your blood sugar goals . High blood sugars (greater than 180 mg/dL) can raise your risk of infections and slow your recovery, so you will need to focus on controlling your diabetes during the weeks before surgery. . Make sure that the doctor who takes care of your diabetes knows about your planned surgery including the date and  location.  How do I manage my blood sugar before surgery? . Check your blood sugar at least 4 times a day, starting 2 days before surgery, to make sure that the level is not too high or low. o Check your blood sugar the morning of your surgery when you wake up and every 2 hours until you get to the Short Stay unit. . If your blood sugar is less than 70 mg/dL, you will need to treat for low blood sugar: o Do not take insulin. o Treat a low blood sugar (less than 70 mg/dL) with  cup of clear juice (cranberry or apple), 4 glucose tablets, OR glucose gel. Recheck blood sugar in 15 minutes after treatment (to make sure it is greater than 70 mg/dL). If your blood sugar is not greater than 70 mg/dL on recheck, call [phone] o  for further instructions. . Report your blood sugar to the short stay nurse when you get to Short Stay.  . If you are admitted to the hospital after surgery: o Your blood sugar will be checked by the staff and you will probably be given insulin after surgery (instead of oral diabetes medicines) to make sure you have good blood sugar levels. o The goal for blood sugar control after surgery is 80-180 mg/dL.     WHAT DO I DO ABOUT MY DIABETES MEDICATION?   [name] Do not take oral diabetes medicines (pills):  metFORMIN (GLUCOPHAGE) the morning of surgery.    The Morning of Surgery  Do not wear jewelry, make-up or nail polish.  Do not wear lotions, powders, or  perfumes/colognes, or deodorant  Do not shave 48 hours prior to surgery.  Men may shave face and neck.  Do not bring valuables to the hospital.  [facility] is not responsible for any belongings or valuables.  If you are a smoker, DO NOT Smoke 24 hours prior to surgery  If you wear a CPAP at night please bring your mask the morning of surgery   Remember that you must have someone to transport you home after your surgery, and remain with you for 24 hours if you are discharged the same day.   Please bring cases  for contacts, glasses, hearing aids, dentures or bridgework because it cannot be worn into surgery.    Leave your suitcase in the car.  After surgery it may be brought to your room.  For patients admitted to the hospital, discharge time will be determined by your treatment team.  Patients discharged the day of surgery will not be allowed to drive home.    Special instructions:   [place]- Preparing For Surgery  Before surgery, you can play an important role. Because skin is not sterile, your skin needs to be as free of germs as possible. You can reduce the number of germs on your skin by washing with CHG (chlorahexidine gluconate) Soap before surgery.  CHG is an antiseptic cleaner which kills germs and bonds with the skin to continue killing germs even after washing.    Oral Hygiene is also important to reduce your risk of infection.  Remember - BRUSH YOUR TEETH THE MORNING OF SURGERY WITH YOUR REGULAR TOOTHPASTE  Please do not use if you have an allergy to CHG or antibacterial soaps. If your skin becomes reddened/irritated stop using the CHG.  Do not shave (including legs and underarms) for at least 48 hours prior to first CHG shower. It is OK to shave your face.  Please follow these instructions carefully.   1. Shower the NIGHT BEFORE SURGERY and the MORNING OF SURGERY with CHG Soap.   2. If you chose to wash your hair, wash your hair first as usual with your normal shampoo.  3. After you shampoo, rinse your hair and body thoroughly to remove the shampoo.  4. Use CHG as you would any other liquid soap. You can apply CHG directly to the skin and wash gently with a scrungie or a clean washcloth.   5. Apply the CHG Soap to your body ONLY FROM THE NECK DOWN.  Do not use on open wounds or open sores. Avoid contact with your eyes, ears, mouth and genitals (private parts). Wash Face and genitals (private parts)  with your normal soap.   6. Wash thoroughly, paying special attention to  the area where your surgery will be performed.  7. Thoroughly rinse your body with warm water from the neck down.  8. DO NOT shower/wash with your normal soap after using and rinsing off the CHG Soap.  9. Pat yourself dry with a CLEAN TOWEL.  10. Wear CLEAN PAJAMAS to bed the night before surgery, wear comfortable clothes the morning of surgery  11. Place CLEAN SHEETS on your bed the night of your first shower and DO NOT SLEEP WITH PETS.    Day of Surgery:  Please shower the morning of surgery with the CHG soap Do not apply any deodorants/lotions. Please wear clean clothes to the hospital/surgery center.   Remember to brush your teeth WITH YOUR REGULAR TOOTHPASTE.   Please read over the following fact sheets that  you were given.

## 2019-05-13 ENCOUNTER — Other Ambulatory Visit (HOSPITAL_COMMUNITY): Payer: Medicaid Other

## 2019-05-13 ENCOUNTER — Encounter (HOSPITAL_COMMUNITY): Payer: Self-pay

## 2019-05-13 ENCOUNTER — Encounter (HOSPITAL_COMMUNITY)
Admission: RE | Admit: 2019-05-13 | Discharge: 2019-05-13 | Disposition: A | Discharge status: Home / Self Care | Payer: Medicaid Other | Source: Ambulatory Visit | Attending: Neurological Surgery | Admitting: Neurological Surgery

## 2019-05-13 ENCOUNTER — Inpatient Hospital Stay: Payer: Medicaid Other

## 2019-05-13 ENCOUNTER — Other Ambulatory Visit: Payer: Self-pay

## 2019-05-13 ENCOUNTER — Other Ambulatory Visit (HOSPITAL_COMMUNITY)
Admission: RE | Admit: 2019-05-13 | Discharge: 2019-05-13 | Disposition: A | Discharge status: Home / Self Care | Payer: Medicaid Other | Source: Ambulatory Visit | Attending: Neurological Surgery | Admitting: Neurological Surgery

## 2019-05-13 VITALS — BP 141/91 | HR 86 | Temp 97.8°F | Resp 20

## 2019-05-13 DIAGNOSIS — Z01812 Encounter for preprocedural laboratory examination: Secondary | ICD-10-CM | POA: Diagnosis present

## 2019-05-13 DIAGNOSIS — Z20822 Contact with and (suspected) exposure to covid-19: Secondary | ICD-10-CM | POA: Diagnosis not present

## 2019-05-13 DIAGNOSIS — C50211 Malignant neoplasm of upper-inner quadrant of right female breast: Secondary | ICD-10-CM | POA: Diagnosis not present

## 2019-05-13 DIAGNOSIS — Z95828 Presence of other vascular implants and grafts: Secondary | ICD-10-CM

## 2019-05-13 LAB — BASIC METABOLIC PANEL
Anion gap: 8 (ref 5–15)
BUN: 8 mg/dL (ref 6–20)
CO2: 26 mmol/L (ref 22–32)
Calcium: 8.2 mg/dL — ABNORMAL LOW (ref 8.9–10.3)
Chloride: 106 mmol/L (ref 98–111)
Creatinine, Ser: 0.75 mg/dL (ref 0.44–1.00)
GFR calc Af Amer: >60 mL/min (ref >60)
GFR calc non Af Amer: >60 mL/min (ref >60)
Glucose, Bld: 115 mg/dL — ABNORMAL HIGH (ref 70–99)
Potassium: 3.5 mmol/L (ref 3.5–5.1)
Sodium: 140 mmol/L (ref 135–145)

## 2019-05-13 LAB — SARS CORONAVIRUS 2 (TAT 6-24 HRS): SARS Coronavirus 2: NEGATIVE

## 2019-05-13 LAB — CBC
HCT: 36.9 % (ref 36.0–46.0)
Hemoglobin: 12.1 g/dL (ref 12.0–15.0)
MCH: 32.5 pg (ref 26.0–34.0)
MCHC: 32.8 g/dL (ref 30.0–36.0)
MCV: 99.2 fL (ref 80.0–100.0)
Platelets: 194 10*3/uL (ref 150–400)
RBC: 3.72 MIL/uL — ABNORMAL LOW (ref 3.87–5.11)
RDW: 14.9 % (ref 11.5–15.5)
WBC: 3.1 10*3/uL — ABNORMAL LOW (ref 4.0–10.5)
nRBC: 0 % (ref 0.0–0.2)

## 2019-05-13 LAB — TYPE AND SCREEN
ABO/RH(D): A POS
Antibody Screen: NEGATIVE

## 2019-05-13 LAB — GLUCOSE, CAPILLARY: Glucose-Capillary: 102 mg/dL — ABNORMAL HIGH (ref 70–99)

## 2019-05-13 MED ORDER — GOSERELIN ACETATE 3.6 MG ~~LOC~~ IMPL
DRUG_IMPLANT | SUBCUTANEOUS | Status: AC
  Filled 2019-05-13: qty 3.6

## 2019-05-13 MED ORDER — DENOSUMAB 120 MG/1.7ML ~~LOC~~ SOLN
120.0000 mg | Freq: Once | SUBCUTANEOUS | Status: AC
  Administered 2019-05-13: 120 mg via SUBCUTANEOUS

## 2019-05-13 MED ORDER — GOSERELIN ACETATE 3.6 MG ~~LOC~~ IMPL
3.6000 mg | DRUG_IMPLANT | Freq: Once | SUBCUTANEOUS | Status: AC
  Administered 2019-05-13: 3.6 mg via SUBCUTANEOUS

## 2019-05-13 MED ORDER — DENOSUMAB 120 MG/1.7ML ~~LOC~~ SOLN
SUBCUTANEOUS | Status: AC
  Filled 2019-05-13: qty 1.7

## 2019-05-13 MED FILL — IBRANCE 125 MG TABS: 125 | 28 days supply | Qty: 21 | Fill #0

## 2019-05-13 NOTE — Progress Notes (Signed)
PCP - pt denies "I just see my oncologist" Burr Medico MD) Cardiologist - pt denies  Chest x-ray - n/a EKG - 12/22/18 Stress Test pt denies-  ECHO - pt denies Cardiac Cath - pt denies   Fasting Blood Sugar - 100-109 Checks Blood Sugar : "every once in awhile just to check. There's not really any specific time that prompts me to check it."   COVID TEST- 05/13/19   Anesthesia review: no  Patient denies shortness of breath, fever, cough and chest pain at PAT appointment   All instructions explained to the patient, with a verbal understanding of the material. Patient agrees to go over the instructions while at home for a better understanding. Patient also instructed to self quarantine after being tested for COVID-19. The opportunity to ask questions was provided.

## 2019-05-13 NOTE — Patient Instructions (Signed)
Denosumab injection What is this medicine? DENOSUMAB (den oh sue mab) slows bone breakdown. Prolia is used to treat osteoporosis in women after menopause and in men, and in people who are taking corticosteroids for 6 months or more. Xgeva is used to treat a high calcium level due to cancer and to prevent bone fractures and other bone problems caused by multiple myeloma or cancer bone metastases. Xgeva is also used to treat giant cell tumor of the bone. This medicine may be used for other purposes; ask your health care provider or pharmacist if you have questions. COMMON BRAND NAME(S): Prolia, XGEVA What should I tell my health care provider before I take this medicine? They need to know if you have any of these conditions:  dental disease  having surgery or tooth extraction  infection  kidney disease  low levels of calcium or Vitamin D in the blood  malnutrition  on hemodialysis  skin conditions or sensitivity  thyroid or parathyroid disease  an unusual reaction to denosumab, other medicines, foods, dyes, or preservatives  pregnant or trying to get pregnant  breast-feeding How should I use this medicine? This medicine is for injection under the skin. It is given by a health care professional in a hospital or clinic setting. A special MedGuide will be given to you before each treatment. Be sure to read this information carefully each time. For Prolia, talk to your pediatrician regarding the use of this medicine in children. Special care may be needed. For Xgeva, talk to your pediatrician regarding the use of this medicine in children. While this drug may be prescribed for children as young as 13 years for selected conditions, precautions do apply. Overdosage: If you think you have taken too much of this medicine contact a poison control center or emergency room at once. NOTE: This medicine is only for you. Do not share this medicine with others. What if I miss a dose? It is  important not to miss your dose. Call your doctor or health care professional if you are unable to keep an appointment. What may interact with this medicine? Do not take this medicine with any of the following medications:  other medicines containing denosumab This medicine may also interact with the following medications:  medicines that lower your chance of fighting infection  steroid medicines like prednisone or cortisone This list may not describe all possible interactions. Give your health care provider a list of all the medicines, herbs, non-prescription drugs, or dietary supplements you use. Also tell them if you smoke, drink alcohol, or use illegal drugs. Some items may interact with your medicine. What should I watch for while using this medicine? Visit your doctor or health care professional for regular checks on your progress. Your doctor or health care professional may order blood tests and other tests to see how you are doing. Call your doctor or health care professional for advice if you get a fever, chills or sore throat, or other symptoms of a cold or flu. Do not treat yourself. This drug may decrease your body's ability to fight infection. Try to avoid being around people who are sick. You should make sure you get enough calcium and vitamin D while you are taking this medicine, unless your doctor tells you not to. Discuss the foods you eat and the vitamins you take with your health care professional. See your dentist regularly. Brush and floss your teeth as directed. Before you have any dental work done, tell your dentist you are   receiving this medicine. Do not become pregnant while taking this medicine or for 5 months after stopping it. Talk with your doctor or health care professional about your birth control options while taking this medicine. Women should inform their doctor if they wish to become pregnant or think they might be pregnant. There is a potential for serious side  effects to an unborn child. Talk to your health care professional or pharmacist for more information. What side effects may I notice from receiving this medicine? Side effects that you should report to your doctor or health care professional as soon as possible:  allergic reactions like skin rash, itching or hives, swelling of the face, lips, or tongue  bone pain  breathing problems  dizziness  jaw pain, especially after dental work  redness, blistering, peeling of the skin  signs and symptoms of infection like fever or chills; cough; sore throat; pain or trouble passing urine  signs of low calcium like fast heartbeat, muscle cramps or muscle pain; pain, tingling, numbness in the hands or feet; seizures  unusual bleeding or bruising  unusually weak or tired Side effects that usually do not require medical attention (report to your doctor or health care professional if they continue or are bothersome):  constipation  diarrhea  headache  joint pain  loss of appetite  muscle pain  runny nose  tiredness  upset stomach This list may not describe all possible side effects. Call your doctor for medical advice about side effects. You may report side effects to FDA at 1-800-FDA-1088. Where should I keep my medicine? This medicine is only given in a clinic, doctor's office, or other health care setting and will not be stored at home. NOTE: This sheet is a summary. It may not cover all possible information. If you have questions about this medicine, talk to your doctor, pharmacist, or health care provider.  2020 Elsevier/Gold Standard (2017-08-08 16:10:44) Goserelin injection What is this medicine? GOSERELIN (GOE se rel in) is similar to a hormone found in the body. It lowers the amount of sex hormones that the body makes. Men will have lower testosterone levels and women will have lower estrogen levels while taking this medicine. In men, this medicine is used to treat prostate  cancer; the injection is either given once per month or once every 12 weeks. A once per month injection (only) is used to treat women with endometriosis, dysfunctional uterine bleeding, or advanced breast cancer. This medicine may be used for other purposes; ask your health care provider or pharmacist if you have questions. COMMON BRAND NAME(S): Zoladex What should I tell my health care provider before I take this medicine? They need to know if you have any of these conditions:  bone problems  diabetes  heart disease  history of irregular heartbeat  an unusual or allergic reaction to goserelin, other medicines, foods, dyes, or preservatives  pregnant or trying to get pregnant  breast-feeding How should I use this medicine? This medicine is for injection under the skin. It is given by a health care professional in a hospital or clinic setting. Talk to your pediatrician regarding the use of this medicine in children. Special care may be needed. Overdosage: If you think you have taken too much of this medicine contact a poison control center or emergency room at once. NOTE: This medicine is only for you. Do not share this medicine with others. What if I miss a dose? It is important not to miss your dose. Call your   doctor or health care professional if you are unable to keep an appointment. What may interact with this medicine? Do not take this medicine with any of the following medications:  cisapride  dronedarone  pimozide  thioridazine This medicine may also interact with the following medications:  other medicines that prolong the QT interval (an abnormal heart rhythm) This list may not describe all possible interactions. Give your health care provider a list of all the medicines, herbs, non-prescription drugs, or dietary supplements you use. Also tell them if you smoke, drink alcohol, or use illegal drugs. Some items may interact with your medicine. What should I watch for  while using this medicine? Visit your doctor or health care provider for regular checks on your progress. Your symptoms may appear to get worse during the first weeks of this therapy. Tell your doctor or healthcare provider if your symptoms do not start to get better or if they get worse after this time. Your bones may get weaker if you take this medicine for a long time. If you smoke or frequently drink alcohol you may increase your risk of bone loss. A family history of osteoporosis, chronic use of drugs for seizures (convulsions), or corticosteroids can also increase your risk of bone loss. Talk to your doctor about how to keep your bones strong. This medicine should stop regular monthly menstruation in women. Tell your doctor if you continue to menstruate. Women should not become pregnant while taking this medicine or for 12 weeks after stopping this medicine. Women should inform their doctor if they wish to become pregnant or think they might be pregnant. There is a potential for serious side effects to an unborn child. Talk to your health care professional or pharmacist for more information. Do not breast-feed an infant while taking this medicine. Men should inform their doctors if they wish to father a child. This medicine may lower sperm counts. Talk to your health care professional or pharmacist for more information. This medicine may increase blood sugar. Ask your healthcare provider if changes in diet or medicines are needed if you have diabetes. What side effects may I notice from receiving this medicine? Side effects that you should report to your doctor or health care professional as soon as possible:  allergic reactions like skin rash, itching or hives, swelling of the face, lips, or tongue  bone pain  breathing problems  changes in vision  chest pain  feeling faint or lightheaded, falls  fever, chills  pain, swelling, warmth in the leg  pain, tingling, numbness in the hands  or feet  signs and symptoms of high blood sugar such as being more thirsty or hungry or having to urinate more than normal. You may also feel very tired or have blurry vision  signs and symptoms of low blood pressure like dizziness; feeling faint or lightheaded, falls; unusually weak or tired  stomach pain  swelling of the ankles, feet, hands  trouble passing urine or change in the amount of urine  unusually high or low blood pressure  unusually weak or tired Side effects that usually do not require medical attention (report to your doctor or health care professional if they continue or are bothersome):  change in sex drive or performance  changes in breast size in both males and females  changes in emotions or moods  headache  hot flashes  irritation at site where injected  loss of appetite  skin problems like acne, dry skin  vaginal dryness This list  may not describe all possible side effects. Call your doctor for medical advice about side effects. You may report side effects to FDA at 1-800-FDA-1088. Where should I keep my medicine? This drug is given in a hospital or clinic and will not be stored at home. NOTE: This sheet is a summary. It may not cover all possible information. If you have questions about this medicine, talk to your doctor, pharmacist, or health care provider.  2020 Elsevier/Gold Standard (2018-07-20 14:05:56)  

## 2019-05-13 NOTE — Progress Notes (Signed)
Per MD ok to give with a calcium of 8.2.

## 2019-05-14 ENCOUNTER — Other Ambulatory Visit: Payer: Self-pay | Admitting: Hematology

## 2019-05-14 DIAGNOSIS — R7309 Other abnormal glucose: Secondary | ICD-10-CM

## 2019-05-14 MED ORDER — DEXTROSE 5 % IV SOLN
3.0000 g | INTRAVENOUS | Status: AC
  Administered 2019-05-17 (×2): 3 g via INTRAVENOUS
  Filled 2019-05-14: qty 3

## 2019-05-16 NOTE — Anesthesia Preprocedure Evaluation (Addendum)
Anesthesia Evaluation  Patient identified by MRN, date of birth, ID band Patient awake    Reviewed: Allergy & Precautions, NPO status , Patient's Chart, lab work & pertinent test results  Airway Mallampati: II  TM Distance: >3 FB     Dental   Pulmonary neg pulmonary ROS,    breath sounds clear to auscultation       Cardiovascular negative cardio ROS   Rhythm:Regular Rate:Normal     Neuro/Psych    GI/Hepatic Neg liver ROS, GERD  ,  Endo/Other  diabetes  Renal/GU negative Renal ROS     Musculoskeletal   Abdominal   Peds  Hematology   Anesthesia Other Findings   Reproductive/Obstetrics                            Anesthesia Physical Anesthesia Plan  ASA: III  Anesthesia Plan: General   Post-op Pain Management:    Induction: Intravenous  PONV Risk Score and Plan: 3 and Ondansetron and Midazolam  Airway Management Planned: Oral ETT  Additional Equipment: Arterial line  Intra-op Plan:   Post-operative Plan: Possible Post-op intubation/ventilation  Informed Consent: I have reviewed the patients History and Physical, chart, labs and discussed the procedure including the risks, benefits and alternatives for the proposed anesthesia with the patient or authorized representative who has indicated his/her understanding and acceptance.     Dental advisory given  Plan Discussed with: Anesthesiologist and CRNA  Anesthesia Plan Comments:       Anesthesia Quick Evaluation

## 2019-05-17 ENCOUNTER — Other Ambulatory Visit: Payer: Self-pay

## 2019-05-17 ENCOUNTER — Inpatient Hospital Stay (HOSPITAL_COMMUNITY)
Admission: RE | Admit: 2019-05-17 | Discharge: 2019-05-19 | DRG: 026 | Disposition: A | Discharge status: Home / Self Care | Payer: Medicaid Other | Attending: Neurological Surgery | Admitting: Neurological Surgery

## 2019-05-17 ENCOUNTER — Encounter (HOSPITAL_COMMUNITY): Payer: Self-pay | Admitting: Neurological Surgery

## 2019-05-17 ENCOUNTER — Inpatient Hospital Stay (HOSPITAL_COMMUNITY): Payer: Medicaid Other | Admitting: Physician Assistant

## 2019-05-17 ENCOUNTER — Encounter (HOSPITAL_COMMUNITY)
Admission: RE | Disposition: A | Discharge status: Home / Self Care | Payer: Self-pay | Source: Home / Self Care | Attending: Neurological Surgery

## 2019-05-17 ENCOUNTER — Inpatient Hospital Stay (HOSPITAL_COMMUNITY): Payer: Medicaid Other

## 2019-05-17 DIAGNOSIS — Z833 Family history of diabetes mellitus: Secondary | ICD-10-CM

## 2019-05-17 DIAGNOSIS — R200 Anesthesia of skin: Secondary | ICD-10-CM | POA: Diagnosis present

## 2019-05-17 DIAGNOSIS — F329 Major depressive disorder, single episode, unspecified: Secondary | ICD-10-CM | POA: Diagnosis present

## 2019-05-17 DIAGNOSIS — K219 Gastro-esophageal reflux disease without esophagitis: Secondary | ICD-10-CM | POA: Diagnosis present

## 2019-05-17 DIAGNOSIS — Z923 Personal history of irradiation: Secondary | ICD-10-CM

## 2019-05-17 DIAGNOSIS — C7951 Secondary malignant neoplasm of bone: Secondary | ICD-10-CM | POA: Diagnosis present

## 2019-05-17 DIAGNOSIS — Z9889 Other specified postprocedural states: Secondary | ICD-10-CM

## 2019-05-17 DIAGNOSIS — C7931 Secondary malignant neoplasm of brain: Secondary | ICD-10-CM | POA: Diagnosis present

## 2019-05-17 DIAGNOSIS — D496 Neoplasm of unspecified behavior of brain: Secondary | ICD-10-CM | POA: Diagnosis present

## 2019-05-17 DIAGNOSIS — Z8249 Family history of ischemic heart disease and other diseases of the circulatory system: Secondary | ICD-10-CM | POA: Diagnosis not present

## 2019-05-17 DIAGNOSIS — E119 Type 2 diabetes mellitus without complications: Secondary | ICD-10-CM | POA: Diagnosis present

## 2019-05-17 DIAGNOSIS — Z853 Personal history of malignant neoplasm of breast: Secondary | ICD-10-CM

## 2019-05-17 DIAGNOSIS — Z9221 Personal history of antineoplastic chemotherapy: Secondary | ICD-10-CM | POA: Diagnosis not present

## 2019-05-17 HISTORY — PX: CRANIOTOMY: SHX93

## 2019-05-17 HISTORY — PX: APPLICATION OF CRANIAL NAVIGATION: SHX6578

## 2019-05-17 LAB — CREATININE, SERUM
Creatinine, Ser: 0.78 mg/dL (ref 0.44–1.00)
GFR calc Af Amer: >60 mL/min (ref >60)
GFR calc non Af Amer: >60 mL/min (ref >60)

## 2019-05-17 LAB — CBC
HCT: 36.5 % (ref 36.0–46.0)
Hemoglobin: 12 g/dL (ref 12.0–15.0)
MCH: 32.2 pg (ref 26.0–34.0)
MCHC: 32.9 g/dL (ref 30.0–36.0)
MCV: 97.9 fL (ref 80.0–100.0)
Platelets: 233 10*3/uL (ref 150–400)
RBC: 3.73 MIL/uL — ABNORMAL LOW (ref 3.87–5.11)
RDW: 14.6 % (ref 11.5–15.5)
WBC: 4.2 10*3/uL (ref 4.0–10.5)
nRBC: 0 % (ref 0.0–0.2)

## 2019-05-17 LAB — MRSA PCR SCREENING: MRSA by PCR: NEGATIVE

## 2019-05-17 LAB — GLUCOSE, CAPILLARY
Glucose-Capillary: 119 mg/dL — ABNORMAL HIGH (ref 70–99)
Glucose-Capillary: 154 mg/dL — ABNORMAL HIGH (ref 70–99)

## 2019-05-17 SURGERY — CRANIOTOMY TUMOR EXCISION
Anesthesia: General | Site: Head

## 2019-05-17 MED ORDER — LIDOCAINE-EPINEPHRINE 1 %-1:100000 IJ SOLN
INTRAMUSCULAR | Status: DC | PRN
  Administered 2019-05-17: 10 mL

## 2019-05-17 MED ORDER — PROPOFOL 1000 MG/100ML IV EMUL
INTRAVENOUS | Status: AC
  Filled 2019-05-17: qty 100

## 2019-05-17 MED ORDER — HEMOSTATIC AGENTS (NO CHARGE) OPTIME
TOPICAL | Status: DC | PRN
  Administered 2019-05-17: 1 via TOPICAL

## 2019-05-17 MED ORDER — CEFAZOLIN SODIUM-DEXTROSE 2-4 GM/100ML-% IV SOLN
2.0000 g | Freq: Three times a day (TID) | INTRAVENOUS | Status: AC
  Administered 2019-05-17 – 2019-05-18 (×2): 2 g via INTRAVENOUS
  Filled 2019-05-17 (×2): qty 100

## 2019-05-17 MED ORDER — ALBUMIN HUMAN 5 % IV SOLN: INTRAVENOUS | Status: DC | PRN

## 2019-05-17 MED ORDER — LABETALOL HCL 5 MG/ML IV SOLN
10.0000 mg | INTRAVENOUS | Status: DC | PRN
  Administered 2019-05-17: 20 mg via INTRAVENOUS
  Filled 2019-05-17: qty 8
  Filled 2019-05-17: qty 4

## 2019-05-17 MED ORDER — FAMOTIDINE IN NACL 20-0.9 MG/50ML-% IV SOLN
20.0000 mg | Freq: Two times a day (BID) | INTRAVENOUS | Status: DC
  Administered 2019-05-17: 20 mg via INTRAVENOUS
  Filled 2019-05-17 (×2): qty 50

## 2019-05-17 MED ORDER — THROMBIN 20000 UNITS EX SOLR
CUTANEOUS | Status: DC | PRN
  Administered 2019-05-17: 09:00:00 20 mL via TOPICAL

## 2019-05-17 MED ORDER — HYDROMORPHONE HCL 1 MG/ML IJ SOLN
0.5000 mg | INTRAMUSCULAR | Status: DC | PRN
  Administered 2019-05-17 – 2019-05-19 (×8): 0.5 mg via INTRAVENOUS
  Filled 2019-05-17 (×8): qty 1

## 2019-05-17 MED ORDER — SUCCINYLCHOLINE CHLORIDE 200 MG/10ML IV SOSY
PREFILLED_SYRINGE | INTRAVENOUS | Status: AC
  Filled 2019-05-17: qty 10

## 2019-05-17 MED ORDER — ONDANSETRON HCL 4 MG PO TABS: 4.0000 mg | ORAL_TABLET | ORAL | Status: DC | PRN

## 2019-05-17 MED ORDER — SODIUM CHLORIDE 0.9 % IV SOLN: INTRAVENOUS | Status: DC

## 2019-05-17 MED ORDER — CHLORHEXIDINE GLUCONATE CLOTH 2 % EX PADS: 6.0000 | MEDICATED_PAD | Freq: Once | CUTANEOUS | Status: DC

## 2019-05-17 MED ORDER — SODIUM CHLORIDE 0.9 % IV SOLN
0.0125 ug/kg/min | INTRAVENOUS | Status: AC
  Filled 2019-05-17: qty 2000

## 2019-05-17 MED ORDER — ACETAMINOPHEN 325 MG PO TABS
650.0000 mg | ORAL_TABLET | ORAL | Status: DC | PRN
  Administered 2019-05-17 – 2019-05-19 (×7): 650 mg via ORAL
  Filled 2019-05-17 (×7): qty 2

## 2019-05-17 MED ORDER — HEPARIN SODIUM (PORCINE) 5000 UNIT/ML IJ SOLN
5000.0000 [IU] | Freq: Three times a day (TID) | INTRAMUSCULAR | Status: DC
  Administered 2019-05-19: 5000 [IU] via SUBCUTANEOUS
  Filled 2019-05-17: qty 1

## 2019-05-17 MED ORDER — CEFAZOLIN SODIUM 1 G IJ SOLR
INTRAMUSCULAR | Status: AC
  Filled 2019-05-17: qty 30

## 2019-05-17 MED ORDER — PROPOFOL 10 MG/ML IV BOLUS
INTRAVENOUS | Status: AC
  Filled 2019-05-17: qty 40

## 2019-05-17 MED ORDER — ONDANSETRON HCL 4 MG/2ML IJ SOLN
INTRAMUSCULAR | Status: DC | PRN
  Administered 2019-05-17: 4 mg via INTRAVENOUS

## 2019-05-17 MED ORDER — PHENYLEPHRINE HCL (PRESSORS) 10 MG/ML IV SOLN
INTRAVENOUS | Status: AC
  Filled 2019-05-17: qty 1

## 2019-05-17 MED ORDER — PROPOFOL 500 MG/50ML IV EMUL
INTRAVENOUS | Status: DC | PRN
  Administered 2019-05-17: 100 ug/kg/min via INTRAVENOUS
  Administered 2019-05-17: 75 ug/kg/min via INTRAVENOUS

## 2019-05-17 MED ORDER — LIDOCAINE-EPINEPHRINE 1 %-1:100000 IJ SOLN
INTRAMUSCULAR | Status: AC
  Filled 2019-05-17: qty 1

## 2019-05-17 MED ORDER — PHENYLEPHRINE 40 MCG/ML (10ML) SYRINGE FOR IV PUSH (FOR BLOOD PRESSURE SUPPORT)
PREFILLED_SYRINGE | INTRAVENOUS | Status: DC | PRN
  Administered 2019-05-17: 120 ug via INTRAVENOUS
  Administered 2019-05-17 (×2): 40 ug via INTRAVENOUS
  Administered 2019-05-17: 160 ug via INTRAVENOUS
  Administered 2019-05-17: 80 ug via INTRAVENOUS
  Administered 2019-05-17: 40 ug via INTRAVENOUS

## 2019-05-17 MED ORDER — MIDAZOLAM HCL 2 MG/2ML IJ SOLN
INTRAMUSCULAR | Status: AC
  Filled 2019-05-17: qty 2

## 2019-05-17 MED ORDER — 0.9 % SODIUM CHLORIDE (POUR BTL) OPTIME
TOPICAL | Status: DC | PRN
  Administered 2019-05-17 (×3): 1000 mL

## 2019-05-17 MED ORDER — FENTANYL CITRATE (PF) 100 MCG/2ML IJ SOLN
25.0000 ug | INTRAMUSCULAR | Status: DC | PRN
  Administered 2019-05-17: 50 ug via INTRAVENOUS

## 2019-05-17 MED ORDER — ONDANSETRON HCL 4 MG/2ML IJ SOLN
4.0000 mg | INTRAMUSCULAR | Status: DC | PRN
  Administered 2019-05-17: 4 mg via INTRAVENOUS
  Filled 2019-05-17: qty 2

## 2019-05-17 MED ORDER — THROMBIN 20000 UNITS EX SOLR
CUTANEOUS | Status: AC
  Filled 2019-05-17: qty 20000

## 2019-05-17 MED ORDER — CHLORHEXIDINE GLUCONATE CLOTH 2 % EX PADS
6.0000 | MEDICATED_PAD | Freq: Every day | CUTANEOUS | Status: DC
  Administered 2019-05-17 – 2019-05-18 (×2): 6 via TOPICAL

## 2019-05-17 MED ORDER — DOCUSATE SODIUM 100 MG PO CAPS
100.0000 mg | ORAL_CAPSULE | Freq: Two times a day (BID) | ORAL | Status: DC
  Administered 2019-05-17 – 2019-05-19 (×4): 100 mg via ORAL
  Filled 2019-05-17 (×4): qty 1

## 2019-05-17 MED ORDER — FENTANYL CITRATE (PF) 100 MCG/2ML IJ SOLN
INTRAMUSCULAR | Status: AC
  Filled 2019-05-17: qty 2

## 2019-05-17 MED ORDER — GADOBUTROL 1 MMOL/ML IV SOLN
10.0000 mL | Freq: Once | INTRAVENOUS | Status: AC | PRN
  Administered 2019-05-17: 10 mL via INTRAVENOUS

## 2019-05-17 MED ORDER — FENTANYL CITRATE (PF) 100 MCG/2ML IJ SOLN: 25.0000 ug | INTRAMUSCULAR | Status: DC | PRN

## 2019-05-17 MED ORDER — FENTANYL CITRATE (PF) 250 MCG/5ML IJ SOLN
INTRAMUSCULAR | Status: AC
  Filled 2019-05-17: qty 5

## 2019-05-17 MED ORDER — PHENYLEPHRINE HCL-NACL 10-0.9 MG/250ML-% IV SOLN
INTRAVENOUS | Status: DC | PRN
  Administered 2019-05-17: 50 ug/min via INTRAVENOUS

## 2019-05-17 MED ORDER — PROPOFOL 10 MG/ML IV BOLUS
INTRAVENOUS | Status: DC | PRN
  Administered 2019-05-17: 100 mg via INTRAVENOUS
  Administered 2019-05-17: 50 mg via INTRAVENOUS
  Administered 2019-05-17: 150 mg via INTRAVENOUS

## 2019-05-17 MED ORDER — PROMETHAZINE HCL 25 MG PO TABS: 12.5000 mg | ORAL_TABLET | ORAL | Status: DC | PRN

## 2019-05-17 MED ORDER — LIDOCAINE 2% (20 MG/ML) 5 ML SYRINGE
INTRAMUSCULAR | Status: DC | PRN
  Administered 2019-05-17: 100 mg via INTRAVENOUS

## 2019-05-17 MED ORDER — ONDANSETRON HCL 4 MG/2ML IJ SOLN
INTRAMUSCULAR | Status: AC
  Filled 2019-05-17: qty 2

## 2019-05-17 MED ORDER — FENTANYL CITRATE (PF) 100 MCG/2ML IJ SOLN
INTRAMUSCULAR | Status: DC | PRN
  Administered 2019-05-17 (×2): 50 ug via INTRAVENOUS

## 2019-05-17 MED ORDER — HYDROCODONE-ACETAMINOPHEN 5-325 MG PO TABS
1.0000 | ORAL_TABLET | ORAL | Status: DC | PRN
  Administered 2019-05-17 – 2019-05-18 (×2): 1 via ORAL
  Filled 2019-05-17 (×2): qty 1

## 2019-05-17 MED ORDER — PROPOFOL 1000 MG/100ML IV EMUL
INTRAVENOUS | Status: AC
  Filled 2019-05-17: qty 200

## 2019-05-17 MED ORDER — BACITRACIN ZINC 500 UNIT/GM EX OINT
TOPICAL_OINTMENT | CUTANEOUS | Status: DC | PRN
  Administered 2019-05-17: 1 via TOPICAL

## 2019-05-17 MED ORDER — THROMBIN 5000 UNITS EX SOLR
CUTANEOUS | Status: AC
  Filled 2019-05-17: qty 5000

## 2019-05-17 MED ORDER — BACITRACIN ZINC 500 UNIT/GM EX OINT
TOPICAL_OINTMENT | CUTANEOUS | Status: AC
  Filled 2019-05-17: qty 28.35

## 2019-05-17 MED ORDER — PHENYLEPHRINE 40 MCG/ML (10ML) SYRINGE FOR IV PUSH (FOR BLOOD PRESSURE SUPPORT)
PREFILLED_SYRINGE | INTRAVENOUS | Status: AC
  Filled 2019-05-17: qty 20

## 2019-05-17 MED ORDER — SODIUM CHLORIDE 0.9 % IV SOLN
0.0125 ug/kg/min | INTRAVENOUS | Status: AC
  Administered 2019-05-17: .1 ug/kg/min via INTRAVENOUS
  Filled 2019-05-17: qty 2000

## 2019-05-17 MED ORDER — THROMBIN 5000 UNITS EX SOLR
OROMUCOSAL | Status: DC | PRN
  Administered 2019-05-17: 09:00:00 5 mL via TOPICAL

## 2019-05-17 MED ORDER — POLYETHYLENE GLYCOL 3350 17 G PO PACK: 17.0000 g | PACK | Freq: Every day | ORAL | Status: DC | PRN

## 2019-05-17 MED ORDER — ONDANSETRON HCL 4 MG/2ML IJ SOLN: 4.0000 mg | INTRAMUSCULAR | Status: DC | PRN

## 2019-05-17 MED ORDER — DEXAMETHASONE SODIUM PHOSPHATE 10 MG/ML IJ SOLN
INTRAMUSCULAR | Status: DC | PRN
  Administered 2019-05-17: 10 mg via INTRAVENOUS

## 2019-05-17 MED ORDER — DEXAMETHASONE SODIUM PHOSPHATE 10 MG/ML IJ SOLN
INTRAMUSCULAR | Status: AC
  Filled 2019-05-17: qty 1

## 2019-05-17 MED ORDER — MICROFIBRILLAR COLL HEMOSTAT EX PADS
MEDICATED_PAD | CUTANEOUS | Status: DC | PRN
  Administered 2019-05-17: 1 via TOPICAL

## 2019-05-17 MED ORDER — ACETAMINOPHEN 650 MG RE SUPP: 650.0000 mg | RECTAL | Status: DC | PRN

## 2019-05-17 SURGICAL SUPPLY — 111 items
ADH SKN CLS APL DERMABOND .7 (GAUZE/BANDAGES/DRESSINGS) ×2
APL SKNCLS STERI-STRIP NONHPOA (GAUZE/BANDAGES/DRESSINGS)
BENZOIN TINCTURE PRP APPL 2/3 (GAUZE/BANDAGES/DRESSINGS) IMPLANT
BLADE 11 SAFETY STRL DISP (BLADE) ×2 IMPLANT
BLADE CLIPPER SURG (BLADE) ×4 IMPLANT
BLADE SAW GIGLI 16 STRL (MISCELLANEOUS) IMPLANT
BLADE SURG 15 STRL LF DISP TIS (BLADE) IMPLANT
BLADE SURG 15 STRL SS (BLADE)
BNDG CMPR 75X41 PLY HI ABS (GAUZE/BANDAGES/DRESSINGS)
BNDG GAUZE ELAST 4 BULKY (GAUZE/BANDAGES/DRESSINGS) IMPLANT
BNDG STRETCH 4X75 STRL LF (GAUZE/BANDAGES/DRESSINGS) IMPLANT
BUR ACORN 9.0 PRECISION (BURR) ×3 IMPLANT
BUR ACORN 9.0MM PRECISION (BURR) ×1
BUR MATCHSTICK NEURO 3.0 LAGG (BURR) ×2 IMPLANT
BUR ROUND FLUTED 4 SOFT TCH (BURR) IMPLANT
BUR ROUND FLUTED 4MM SOFT TCH (BURR)
BUR SPIRAL ROUTER 2.3 (BUR) ×2 IMPLANT
BUR SPIRAL ROUTER 2.3MM (BUR)
CANISTER SUCT 3000ML PPV (MISCELLANEOUS) ×8 IMPLANT
CATH VENTRIC 35X38 W/TROCAR LG (CATHETERS) IMPLANT
CLIP VESOCCLUDE MED 6/CT (CLIP) IMPLANT
CNTNR URN SCR LID CUP LEK RST (MISCELLANEOUS) ×2 IMPLANT
CONT SPEC 4OZ STRL OR WHT (MISCELLANEOUS) ×4
COVER MAYO STAND STRL (DRAPES) IMPLANT
COVER WAND RF STERILE (DRAPES) ×2 IMPLANT
DECANTER SPIKE VIAL GLASS SM (MISCELLANEOUS) ×4 IMPLANT
DERMABOND ADVANCED (GAUZE/BANDAGES/DRESSINGS) ×2
DERMABOND ADVANCED .7 DNX12 (GAUZE/BANDAGES/DRESSINGS) IMPLANT
DRAIN SUBARACHNOID (WOUND CARE) IMPLANT
DRAPE HALF SHEET 40X57 (DRAPES) ×4 IMPLANT
DRAPE LAPAROTOMY T 98X78 PEDS (DRAPES) IMPLANT
DRAPE MICROSCOPE LEICA (MISCELLANEOUS) ×2 IMPLANT
DRAPE NEUROLOGICAL W/INCISE (DRAPES) ×4 IMPLANT
DRAPE STERI IOBAN 125X83 (DRAPES) IMPLANT
DRAPE SURG 17X23 STRL (DRAPES) IMPLANT
DRAPE WARM FLUID 44X44 (DRAPES) ×4 IMPLANT
DRSG ADAPTIC 3X8 NADH LF (GAUZE/BANDAGES/DRESSINGS) IMPLANT
DRSG TELFA 3X8 NADH (GAUZE/BANDAGES/DRESSINGS) IMPLANT
DURAPREP 6ML APPLICATOR 50/CS (WOUND CARE) ×6 IMPLANT
ELECT REM PT RETURN 9FT ADLT (ELECTROSURGICAL) ×4
ELECTRODE REM PT RTRN 9FT ADLT (ELECTROSURGICAL) ×2 IMPLANT
EVACUATOR 1/8 PVC DRAIN (DRAIN) IMPLANT
EVACUATOR SILICONE 100CC (DRAIN) IMPLANT
FEE INTRAOP MONITOR IMPULS NCS (MISCELLANEOUS) IMPLANT
FORCEPS BIPOLAR SPETZLER 8 1.0 (NEUROSURGERY SUPPLIES) ×4 IMPLANT
GAUZE 4X4 16PLY RFD (DISPOSABLE) IMPLANT
GAUZE SPONGE 4X4 12PLY STRL (GAUZE/BANDAGES/DRESSINGS) IMPLANT
GLOVE BIO SURGEON STRL SZ7 (GLOVE) ×2 IMPLANT
GLOVE BIO SURGEON STRL SZ7.5 (GLOVE) ×4 IMPLANT
GLOVE BIO SURGEON STRL SZ8 (GLOVE) ×2 IMPLANT
GLOVE BIOGEL PI IND STRL 7.0 (GLOVE) IMPLANT
GLOVE BIOGEL PI IND STRL 7.5 (GLOVE) ×2 IMPLANT
GLOVE BIOGEL PI IND STRL 8.5 (GLOVE) IMPLANT
GLOVE BIOGEL PI INDICATOR 7.0 (GLOVE)
GLOVE BIOGEL PI INDICATOR 7.5 (GLOVE) ×4
GLOVE BIOGEL PI INDICATOR 8.5 (GLOVE) ×2
GLOVE EXAM NITRILE LRG STRL (GLOVE) IMPLANT
GLOVE EXAM NITRILE XL STR (GLOVE) IMPLANT
GLOVE EXAM NITRILE XS STR PU (GLOVE) IMPLANT
GLOVE SURG SS PI 7.0 STRL IVOR (GLOVE) ×8 IMPLANT
GOWN STRL REUS W/ TWL LRG LVL3 (GOWN DISPOSABLE) ×4 IMPLANT
GOWN STRL REUS W/ TWL XL LVL3 (GOWN DISPOSABLE) IMPLANT
GOWN STRL REUS W/TWL 2XL LVL3 (GOWN DISPOSABLE) IMPLANT
GOWN STRL REUS W/TWL LRG LVL3 (GOWN DISPOSABLE) ×4
GOWN STRL REUS W/TWL XL LVL3 (GOWN DISPOSABLE) ×12
HEMOSTAT POWDER KIT SURGIFOAM (HEMOSTASIS) ×4 IMPLANT
HEMOSTAT SURGICEL 2X14 (HEMOSTASIS) ×4 IMPLANT
HOOK DURA 1/2IN (MISCELLANEOUS) ×4 IMPLANT
INTRAOP MONITOR FEE IMPULS NCS (MISCELLANEOUS) ×2
INTRAOP MONITOR FEE IMPULSE (MISCELLANEOUS) ×2
IV NS 1000ML (IV SOLUTION)
IV NS 1000ML BAXH (IV SOLUTION) ×2 IMPLANT
KIT BASIN OR (CUSTOM PROCEDURE TRAY) ×4 IMPLANT
KIT DRAIN CSF ACCUDRAIN (MISCELLANEOUS) IMPLANT
KIT TURNOVER KIT B (KITS) ×4 IMPLANT
MARKER SPHERE PSV REFLC 13MM (MARKER) ×8 IMPLANT
NDL SPNL 18GX3.5 QUINCKE PK (NEEDLE) IMPLANT
NEEDLE HYPO 22GX1.5 SAFETY (NEEDLE) ×4 IMPLANT
NEEDLE SPNL 18GX3.5 QUINCKE PK (NEEDLE) IMPLANT
NS IRRIG 1000ML POUR BTL (IV SOLUTION) ×12 IMPLANT
PACK CRANIOTOMY CUSTOM (CUSTOM PROCEDURE TRAY) ×4 IMPLANT
PAD DRESSING TELFA 3X8 NADH (GAUZE/BANDAGES/DRESSINGS) IMPLANT
PATTIES SURGICAL .25X.25 (GAUZE/BANDAGES/DRESSINGS) ×2 IMPLANT
PATTIES SURGICAL .5 X.5 (GAUZE/BANDAGES/DRESSINGS) IMPLANT
PATTIES SURGICAL .5 X3 (DISPOSABLE) IMPLANT
PATTIES SURGICAL 1/4 X 3 (GAUZE/BANDAGES/DRESSINGS) IMPLANT
PATTIES SURGICAL 1X1 (DISPOSABLE) IMPLANT
PIN MAYFIELD SKULL DISP (PIN) ×4 IMPLANT
PLATE 1.5/0.6 85X53M SM PANEL (Plate) ×2 IMPLANT
RUBBERBAND STERILE (MISCELLANEOUS) ×4 IMPLANT
SCREW SELF DRILL HT 1.5/4MM (Screw) ×10 IMPLANT
SEALANT ADHERUS EXTEND TIP (MISCELLANEOUS) ×2 IMPLANT
SPECIMEN JAR SMALL (MISCELLANEOUS) ×2 IMPLANT
SPONGE NEURO XRAY DETECT 1X3 (DISPOSABLE) IMPLANT
SPONGE SURGIFOAM ABS GEL 100 (HEMOSTASIS) ×4 IMPLANT
STAPLER VISISTAT 35W (STAPLE) ×4 IMPLANT
SUT ETHILON 3 0 FSL (SUTURE) IMPLANT
SUT ETHILON 3 0 PS 1 (SUTURE) IMPLANT
SUT MNCRL AB 3-0 PS2 18 (SUTURE) ×2 IMPLANT
SUT NURALON 4 0 TR CR/8 (SUTURE) ×10 IMPLANT
SUT SILK 0 TIES 10X30 (SUTURE) IMPLANT
SUT VIC AB 2-0 CP2 18 (SUTURE) ×8 IMPLANT
TIP NONSTICK .5MMX23CM (INSTRUMENTS) ×4
TIP NONSTICK .5X23 (INSTRUMENTS) IMPLANT
TOWEL GREEN STERILE (TOWEL DISPOSABLE) ×4 IMPLANT
TOWEL GREEN STERILE FF (TOWEL DISPOSABLE) ×4 IMPLANT
TRAY FOLEY MTR SLVR 16FR STAT (SET/KITS/TRAYS/PACK) ×4 IMPLANT
TUBE CONNECTING 12'X1/4 (SUCTIONS) ×1
TUBE CONNECTING 12X1/4 (SUCTIONS) ×3 IMPLANT
UNDERPAD 30X30 (UNDERPADS AND DIAPERS) ×2 IMPLANT
WATER STERILE IRR 1000ML POUR (IV SOLUTION) ×4 IMPLANT

## 2019-05-17 NOTE — Transfer of Care (Signed)
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  Last Pain:  Vitals:   05/17/19 0644  TempSrc:   PainSc: 5          Complications: No apparent anesthesia complications

## 2019-05-17 NOTE — Anesthesia Procedure Notes (Signed)
Arterial Line Insertion Start/End2/04/2019 7:15 AM, 05/17/2019 7:20 AM Performed by: Barrington Ellison, CRNA, CRNA  Patient location: Pre-op. Preanesthetic checklist: patient identified, risks and benefits discussed and pre-op evaluation Lidocaine 1% used for infiltration Left, radial was placed Catheter size: 20 G Hand hygiene performed  and maximum sterile barriers used  Allen's test indicative of satisfactory collateral circulation Attempts: 1 Procedure performed without using ultrasound guided technique. Following insertion, dressing applied and Biopatch. Post procedure assessment: normal  Patient tolerated the procedure well with no immediate complications. Additional procedure comments: Performed by Raj Janus, SRNA.

## 2019-05-17 NOTE — Op Note (Signed)
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 She has a history of breast cancer so an MRI brain was obtained, which showed a mass in the lateral fourth ventricle. This was discussed with the patient as well as risks, benefits, and alternatives and wished to proceed with surgery.   OPERATIVE DETAIL: The patient was taken to the operating room, anesthesia was induced by the anesthesia team, the head was placed in a mayfield head holder, and the patient was placed on the OR table in the prone position. A formal time out was performed with two patient identifiers and confirmed the operative site. A registration array was attached to the [place]. This was co-registered with the patient's preoperative imaging, the fit appeared to be acceptable. Using frameless stereotaxy, the operative trajectory was planned and the incision was marked. Hair was clipped with surgical clippers over the incision and the area was then prepped and draped in a sterile fashion.  A linear incision was placed in the midline from just inferior to the inion to the level of the spinous process of C2. Soft tissues were dissected, retractors placed, and a suboccipital craniectomy was created using a high speed drill and kerrison rongeurs. A small midline C1 laminectomy was performed to allow better access to the surgical corridor. The dura was opened in a Y in the standard fashion and retracted with sutures. The right tonsil was elevated and the distal segment of PICA was identified,  protected, and followed. The tela choroidea was dissected to access the lateral recess of the fourth ventricle towards Luschka.   The tumor was not immediately obvious in the ventricle, which was explored out to Luschka. The superior pole of the tumor was not clearly visible. As expected, the medulla was enlarged. There was a thinned area in the posterior medulla where the tumor was visible. Given that the majority of tumor appeared intrinsic to the brainstem, I thought that resection was too high risk and therefore sharply opened this thinned area, parallel to the fibers. There was obvious tumor underlying, which was carefully dissected enough for tissue to be sent for pathology. 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 As I had discussed with the patient preop, the goal was to get a diagnosis and perform a maximal safe resection without new neurologic deficits. Given that further tumor dissection in the medulla was likely to lead to deficits, I did not proceed with further dissection. Bleeding from the biopsy site was controlled with gelfoam to avoid cautery.  Hemostasis was confirmed, the dura was closed with suture, dural sealant (Adheris, Stryker) was placed over the dural closure, and a piece of titanium mesh was secured with titanium screws. The wound was copiously irrigated with sterile solution.    All instrument and sponge counts were correct, the incision was then closed in layers. The patient was then returned to anesthesia for emergence. No apparent complications at the completion of the procedure.   EBL:  341mL   DRAINS: none   SPECIMENS: Brainstem tumor   Judith Part,  MD 05/17/19 7:13 AM

## 2019-05-17 NOTE — Anesthesia Procedure Notes (Addendum)
Procedure Name: Intubation Date/Time: 05/17/2019 8:02 AM Performed by: Barrington Ellison, CRNA Pre-anesthesia Checklist: Patient identified, Emergency Drugs available, Suction available and Patient being monitored Patient Re-evaluated:Patient Re-evaluated prior to induction Oxygen Delivery Method: Circle System Utilized Preoxygenation: Pre-oxygenation with 100% oxygen Induction Type: IV induction Ventilation: Mask ventilation without difficulty Laryngoscope Size: Mac and 3 Tube type: Oral Tube size: 7.5 mm Number of attempts: 1 Airway Equipment and Method: Stylet and Oral airway Placement Confirmation: ETT inserted through vocal cords under direct vision,  positive ETCO2 and breath sounds checked- equal and bilateral Secured at: 22 cm Tube secured with: Tape Dental Injury: Teeth and Oropharynx as per pre-operative assessment  Comments: Inserted by Paulina Fusi, SRNA

## 2019-05-17 NOTE — Anesthesia Postprocedure Evaluation (Signed)
Anesthesia Post Note  Patient: Caitlin Cohen  Procedure(s) Performed: Suboccipital craniotomy for tumor resection with brainlab (N/A Head) APPLICATION OF CRANIAL NAVIGATION (N/A )     Patient location during evaluation: PACU Anesthesia Type: General Level of consciousness: awake Pain management: pain level controlled Vital Signs Assessment: post-procedure vital signs reviewed and stable Cardiovascular status: stable Postop Assessment: no apparent nausea or vomiting Anesthetic complications: no    Last Vitals:  Vitals:   05/17/19 1348 05/17/19 1412  BP: (!) 152/91 (!) 158/98  Pulse: 94 88  Resp: 15 14  Temp: 36.8 C 36.8 C  SpO2: 98% 95%    Last Pain:  Vitals:   05/17/19 1412  TempSrc: Oral  PainSc:                  Evagelia Knack

## 2019-05-17 NOTE — Progress Notes (Signed)
Called OR to contact and inform MD Ostergard about pt's art-line maintaining in the 180-190s, cuff SBP 160s, no BP parameters obtained by Ostergard at this time, no new orders. Will continue to monitor.

## 2019-05-17 NOTE — H&P (Signed)
Surgical H&P Update  HPI: 54 y.o. woman with a history of right sided face and hemibody sensory changes, MRI showed a mass in the 4th ventricle. No changes in health since she was last seen. Still having sensory symptoms, wishes to proceed with surgery.  PMHx:  Past Medical History:  Diagnosis Date  . Anemia   . Anxiety   . Cancer ([place]) dx'd 06/2017   breast cancer right  . Depression   . Diabetes mellitus without complication ([place])   . GERD (gastroesophageal reflux disease)   . Headache   . History of radiation therapy 11/10/17- 12/08/17   right breast, 40.05 Gy in 15 fractions. Right breast boost 10 Gy in 5 fractions.   . 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 Personal history of chemotherapy   . Personal history of radiation therapy    FamHx:  Family History  Problem Relation Age of Onset  . Hypertension Mother   . Diabetes Father   . Hypertension Father    SocHx:  reports that she has never smoked. She has never used smokeless tobacco. She reports current alcohol use. She reports that she does not use drugs.  Physical Exam: AOx3, PERRL, FS, TM  Strength 5/5 x4, SILTx4 except decreased sensation across right hemibody and right face in all 3 distributions of CN5, no diplopia but some mild right 6th dysfunction on right lateral gaze  Assesment/Plan: 54 y.o. woman with woman, here for suboccipital resection of a 4th ventricular mass. Risks, benefits, and alternatives discussed and the patient would like to continue with surgery.  -OR today -4N post-op  Judith Part, MD 05/17/19 7:10 AM

## 2019-05-18 ENCOUNTER — Encounter: Payer: Self-pay | Admitting: *Deleted

## 2019-05-18 LAB — GLUCOSE, CAPILLARY
Glucose-Capillary: 97 mg/dL (ref 70–99)
Glucose-Capillary: 132 mg/dL — ABNORMAL HIGH (ref 70–99)
Glucose-Capillary: 114 mg/dL — ABNORMAL HIGH (ref 70–99)

## 2019-05-18 MED ORDER — INSULIN ASPART 100 UNIT/ML ~~LOC~~ SOLN: 0.0000 [IU] | Freq: Three times a day (TID) | SUBCUTANEOUS | Status: DC

## 2019-05-18 MED ORDER — FAMOTIDINE 20 MG PO TABS
20.0000 mg | ORAL_TABLET | Freq: Two times a day (BID) | ORAL | Status: DC
  Administered 2019-05-18 – 2019-05-19 (×3): 20 mg via ORAL
  Filled 2019-05-18 (×3): qty 1

## 2019-05-18 NOTE — Plan of Care (Signed)
  Problem: Activity: Goal: Ability to tolerate increased activity will improve Outcome: Progressing   Problem: Skin Integrity: Goal: Demonstration of wound healing without infection will improve Outcome: Progressing   Problem: Nutrition: Goal: Maintenance of adequate nutrition will improve Outcome: Completed/Met   Problem: Respiratory: Goal: Will regain and/or maintain adequate ventilation Outcome: Completed/Met

## 2019-05-18 NOTE — Evaluation (Signed)
Physical Therapy Evaluation Patient Details Name: Caitlin Cohen MRN: [id number] DOB: Jul 05, 1965 Today's Date: 05/18/2019   History of Present Illness  Pt is 54 yo female with history of anemia, breast CA, DM, and metastatic bone CA (pelvis, spine, ribs). She is now s/p suboccipital craniotomy with partial resection of brainstem mass on 05/17/19.  Pt with R sided sensory changes prior to admission.  Clinical Impression  Pt admitted with above diagnosis. Pt was able to ambulate 200' total with RW and min guard with mild instability and reports of min dizziness.  Does have decreased sensation on R side that was present prior to admission.  Pt currently with functional limitations due to the deficits listed below (see PT Problem List). Pt will benefit from skilled PT to increase their independence and safety with mobility to allow discharge to the venue listed below.       Follow Up Recommendations No PT follow up    Equipment Recommendations  None recommended by PT    Recommendations for Other Services       Precautions / Restrictions Precautions Precautions: Fall Restrictions Weight Bearing Restrictions: No      Mobility  Bed Mobility Overal bed mobility: Needs Assistance Bed Mobility: Supine to Sit     Supine to sit: Supervision     General bed mobility comments: in chair  Transfers Overall transfer level: Needs assistance Equipment used: Rolling walker (2 wheeled) Transfers: Sit to/from Stand Sit to Stand: Min guard         General transfer comment: cues for hand placment; performed x 2  Ambulation/Gait Ambulation/Gait assistance: Min guard Gait Distance (Feet): 150 Feet Assistive device: Rolling walker (2 wheeled) Gait Pattern/deviations: Step-through pattern     General Gait Details: ambulated 150' then 50'; chair follow for safety; had 1 LOB recovered independently; reports mild dizziness  Stairs            Wheelchair Mobility    Modified Rankin  (Stroke Patients Only)       Balance Overall balance assessment: Needs assistance Sitting-balance support: No upper extremity supported;Feet supported Sitting balance-Leahy Scale: Good     Standing balance support: Bilateral upper extremity supported;During functional activity Standing balance-Leahy Scale: Fair Standing balance comment: use of RW                             Pertinent Vitals/Pain Pain Assessment: 0-10 Pain Score: 4  Pain Location: Neck Pain Intervention(s): Monitored during session    Home Living Family/patient expects to be discharged to:: Private residence Living Arrangements: Children Available Help at Discharge: Family;Available PRN/intermittently Type of Home: Apartment Home Access: Level entry     Home Layout: One level Home Equipment: Cane - single point      Prior Function Level of Independence: Independent with assistive device(s)   Gait / Transfers Assistance Needed: Uses cane if walking outside house; no AD in house; can walk community distances     Comments: Driving a little; grocery shopping done for pt; pt cooks and performs light cleaning; daughter assists in/out of shower     Hand Dominance   Dominant Hand: Right    Extremity/Trunk Assessment   Upper Extremity Assessment Upper Extremity Assessment: Defer to OT evaluation RUE Deficits / Details: strength 4+/5, grip WFLs RUE Coordination: WNL LUE Deficits / Details: strength 4+/5, grip WFLs LUE Coordination: WNL    Lower Extremity Assessment Lower Extremity Assessment: RLE deficits/detail;LLE deficits/detail RLE Deficits / Details: ROM  WFL and MMT grossly 5/5 RLE Sensation: decreased light touch(does have some sensation just decreased throughout R side) LLE Deficits / Details: ROM WFL and MMT 5/5 LLE Sensation: WNL    Cervical / Trunk Assessment Cervical / Trunk Assessment: Normal  Communication   Communication: No difficulties  Cognition Arousal/Alertness:  Awake/alert Behavior During Therapy: WFL for tasks assessed/performed Overall Cognitive Status: Within Functional Limits for tasks assessed                                        General Comments General comments (skin integrity, edema, etc.): BP was 140/90 prior and HR 90's.  SpO2 on RA at rest 97-99%.  Did drop to 85% with gripping walker but when hand relaxed quickly returned to 95%.    Exercises     Assessment/Plan    PT Assessment Patient needs continued PT services  PT Problem List Decreased mobility;Decreased coordination;Decreased activity tolerance;Decreased balance;Decreased knowledge of use of DME       PT Treatment Interventions DME instruction;Therapeutic activities;Gait training;Patient/family education;Therapeutic exercise;Stair training;Balance training;Functional mobility training    PT Goals (Current goals can be found in the Care Plan section)  Acute Rehab PT Goals Patient Stated Goal: return home; does not feel will need HHPT PT Goal Formulation: With patient Time For Goal Achievement: 06/01/19 Potential to Achieve Goals: Good    Frequency Min 3X/week   Barriers to discharge        Co-evaluation               AM-PAC PT "6 Clicks" Mobility  Outcome Measure Help needed turning from your back to your side while in a flat bed without using bedrails?: None Help needed moving from lying on your back to sitting on the side of a flat bed without using bedrails?: None Help needed moving to and from a bed to a chair (including a wheelchair)?: None Help needed standing up from a chair using your arms (e.g., wheelchair or bedside chair)?: None Help needed to walk in hospital room?: A Little Help needed climbing 3-5 steps with a railing? : A Little 6 Click Score: 8    End of Session Equipment Utilized During Treatment: Gait belt Activity Tolerance: Patient tolerated treatment well Patient left: Other (comment);with call bell/phone within  reach(in transport chair, nursing aware and are moving pt to 4NP) Nurse Communication: Mobility status PT Visit Diagnosis: Unsteadiness on feet (R26.81);Other abnormalities of gait and mobility (R26.89)    Time: [id number] PT Time Calculation (min) (ACUTE ONLY): 20 min   Charges:   PT Evaluation $PT Eval Moderate Complexity: 1 Mod          Maggie Font, PT Acute Rehab Services Pager [phone] [facility] [phone] [facility] [phone]   Karlton Lemon 05/18/2019, 12:16 PM

## 2019-05-18 NOTE — Evaluation (Signed)
Occupational Therapy Evaluation Patient Details Name: Caitlin Cohen MRN: [id number] DOB: 1965/08/07 Today's Date: 05/18/2019    History of Present Illness Pt is 54 yo female with history of anemia, breast CA, DM, and metastatic bone CA (pelvis, spine, ribs). She is now s/p suboccipital craniotomy with partial resection of brainstem mass on 05/17/19.  Pt with R sided sensory changes prior to admission.   Clinical Impression   Pt PTA: Pt living with daughter who works during the day. Pt reports independence with ADL and mobility with SPC. Pt required assist for transfer in/out of tub shower. Pt currently appears very close to baseline functioning, supervisionA advised. Pt supervisionA to minguardA for mobility; supervisionA to minguardA overall for ADL- standing at commode and sink. Pt appears A/O with higher level cognition tasks. Pt will be home alone for periods of time. Next session to focus on continued higher level cognitive with ADL. Pt would benefit from continued OT, but expect pt to not require continued OT. OT following.  Elevated BP after exertion, but noticed that BP cuff was in elbow (144/113 (123); BP taken at rest after exertion in recliner with repositioned cuff: 140/90 (78), 91 BPM, 100% O2.     Follow Up Recommendations  No OT follow up;Supervision - Intermittent    Equipment Recommendations  3 in 1 bedside commode;Tub/shower bench    Recommendations for Other Services       Precautions / Restrictions Precautions Precautions: Fall Restrictions Weight Bearing Restrictions: No      Mobility Bed Mobility Overal bed mobility: Needs Assistance Bed Mobility: Supine to Sit     Supine to sit: Supervision     General bed mobility comments: no use of rail, HOB slightly elevated.  Transfers Overall transfer level: Needs assistance Equipment used: Rolling walker (2 wheeled) Transfers: Sit to/from Stand Sit to Stand: Supervision;Min guard         General  transfer comment: minguardA for initial standing balance    Balance Overall balance assessment: Needs assistance Sitting-balance support: Bilateral upper extremity supported Sitting balance-Leahy Scale: Fair     Standing balance support: Single extremity supported;During functional activity Standing balance-Leahy Scale: Fair                             ADL either performed or assessed with clinical judgement   ADL Overall ADL's : Needs assistance/impaired Eating/Feeding: Modified independent;Sitting   Grooming: Supervision/safety;Standing   Upper Body Bathing: Supervision/ safety;Standing   Lower Body Bathing: Supervison/ safety;Sitting/lateral leans;Sit to/from stand   Upper Body Dressing : Supervision/safety;Sitting;Standing   Lower Body Dressing: Supervision/safety;Sitting/lateral leans;Sit to/from stand;Cueing for safety   Toilet Transfer: Supervision/safety;Ambulation;RW   Toileting- Clothing Manipulation and Hygiene: Supervision/safety;Cueing for safety;Sitting/lateral lean;Sit to/from stand       Functional mobility during ADLs: Supervision/safety;Min guard;Rolling walker;Cueing for safety General ADL Comments: Pt appears very close to baseline functioning, supervisionA advised. Pt appears A/O with higher level cognition tasks. Pt will be home alone for periods of time.     Vision Baseline Vision/History: No visual deficits Patient Visual Report: No change from baseline Vision Assessment?: Yes Eye Alignment: Within Functional Limits Ocular Range of Motion: Within Functional Limits Alignment/Gaze Preference: Within Defined Limits Tracking/Visual Pursuits: Able to track stimulus in all quads without difficulty     Perception     Praxis      Pertinent Vitals/Pain Pain Assessment: 0-10 Pain Score: 3  Pain Location: Neck Pain Intervention(s): Monitored during session  Hand Dominance Right   Extremity/Trunk Assessment Upper Extremity  Assessment Upper Extremity Assessment: Generalized weakness;LUE deficits/detail;RUE deficits/detail RUE Deficits / Details: strength 4+/5, grip WFLs RUE Coordination: WNL LUE Deficits / Details: strength 4+/5, grip WFLs LUE Coordination: WNL   Lower Extremity Assessment Lower Extremity Assessment: Generalized weakness;Defer to PT evaluation   Cervical / Trunk Assessment Cervical / Trunk Assessment: Normal   Communication Communication Communication: No difficulties   Cognition Arousal/Alertness: Awake/alert Behavior During Therapy: WFL for tasks assessed/performed Overall Cognitive Status: Within Functional Limits for tasks assessed                                     General Comments  Elevated BP after exertion, but noticed that BP cuff was in elbow (144/113 (123); BP taken at rest after exertion in recliner: 140/90 (78), 91 BPM, 100% O2.    Exercises     Shoulder Instructions      Home Living Family/patient expects to be discharged to:: Private residence Living Arrangements: Children Available Help at Discharge: Family;Available PRN/intermittently Type of Home: Apartment Home Access: Level entry     Home Layout: One level     Bathroom Shower/Tub: Teacher, early years/pre: Standard     Home Equipment: Cane - single point          Prior Functioning/Environment Level of Independence: Independent with assistive device(s)  Gait / Transfers Assistance Needed: Uses cane if walking outside house; no AD in house; can walk community distances     Comments: Driving a little; grocery shopping done for pt; pt cooks and performs light cleaning; daughter assists in/out of shower        OT Problem List: Decreased activity tolerance;Pain      OT Treatment/Interventions: Self-care/ADL training;Therapeutic exercise;Energy conservation;DME and/or AE instruction;Therapeutic activities;Balance training;Patient/family education    OT Goals(Current  goals can be found in the care plan section) Acute Rehab OT Goals Patient Stated Goal: to go home soon OT Goal Formulation: With patient Time For Goal Achievement: 06/01/19 Potential to Achieve Goals: Good ADL Goals Pt Will Perform Lower Body Dressing: with supervision;sit to/from stand Additional ADL Goal #1: Pt will follow multi-step commands with ADL tasks with 100% accuracy in 2/3 trials.  OT Frequency: Min 2X/week   Barriers to D/C:            Co-evaluation              AM-PAC OT "6 Clicks" Daily Activity     Outcome Measure Help from another person eating meals?: None Help from another person taking care of personal grooming?: None Help from another person toileting, which includes using toliet, bedpan, or urinal?: A Little Help from another person bathing (including washing, rinsing, drying)?: A Little Help from another person to put on and taking off regular upper body clothing?: None Help from another person to put on and taking off regular lower body clothing?: None 6 Click Score: 22   End of Session Equipment Utilized During Treatment: Gait belt;Rolling walker Nurse Communication: Mobility status  Activity Tolerance: Patient tolerated treatment well Patient left: in chair;with call bell/phone within reach  OT Visit Diagnosis: Unsteadiness on feet (R26.81);Muscle weakness (generalized) (M62.81);Pain Pain - part of body: (neck)                Time: [id number] OT Time Calculation (min): 36 min Charges:  OT General Charges $OT Visit: 1 Visit OT Evaluation $  OT Eval Moderate Complexity: 1 Mod OT Treatments $Self Care/Home Management : 8-22 mins  Jefferey Pica OTR/L Acute Rehabilitation Services Pager: [phone] Office: [phone]   Nina Hoar C 05/18/2019, 12:08 PM

## 2019-05-18 NOTE — Progress Notes (Signed)
Neurosurgery Service Progress Note  Subjective: No acute events overnight, no new complaints this morning except for expected neck pain, stable numbness, no weakness, no dysphagia  Objective: Vitals:   05/18/19 0500 05/18/19 0600 05/18/19 0700 05/18/19 0800  BP: 126/85 132/80 (!) 141/112 (!) 160/94  Pulse: (!) 108 83 (!) 116 76  Resp: 19 12 20 11   Temp:    100 F (37.8 C)  TempSrc:    Oral  SpO2: 99% 98% 98% 100%  Weight:      Height:       Temp (24hrs), Avg:98.5 F (36.9 C), Min:97.7 F (36.5 C), Max:100 F (37.8 C)  CBC Latest Ref Rng & Units 05/17/2019 05/13/2019 04/15/2019  WBC 4.0 - 10.5 K/uL 4.2 3.1(L) 2.8(L)  Hemoglobin 12.0 - 15.0 g/dL 12.0 12.1 12.0  Hematocrit 36.0 - 46.0 % 36.5 36.9 35.9(L)  Platelets 150 - 400 K/uL 233 194 205   BMP Latest Ref Rng & Units 05/17/2019 05/13/2019 04/15/2019  Glucose 70 - 99 mg/dL - 115(H) 111(H)  BUN 6 - 20 mg/dL - 8 13  Creatinine 0.44 - 1.00 mg/dL 0.78 0.75 0.78  Sodium 135 - 145 mmol/L - 140 139  Potassium 3.5 - 5.1 mmol/L - 3.5 3.6  Chloride 98 - 111 mmol/L - 106 104  CO2 22 - 32 mmol/L - 26 26  Calcium 8.9 - 10.3 mg/dL - 8.2(L) 8.8(L)    Intake/Output Summary (Last 24 hours) at 05/18/2019 0902 Last data filed at 05/18/2019 0543 Gross per 24 hour  Intake 1900.07 ml  Output 1880 ml  Net 20.07 ml    Current Facility-Administered Medications:  .  acetaminophen (TYLENOL) tablet 650 mg, 650 mg, Oral, Q4H PRN, 650 mg at 05/18/19 0650 **OR** acetaminophen (TYLENOL) suppository 650 mg, 650 mg, Rectal, Q4H PRN, Judith Part, MD .  Chlorhexidine Gluconate Cloth 2 % PADS 6 each, 6 each, Topical, Daily, Judith Part, MD, 6 each at 05/17/19 1415 .  docusate sodium (COLACE) capsule 100 mg, 100 mg, Oral, BID, Judith Part, MD, 100 mg at 05/17/19 2132 .  famotidine (PEPCID) IVPB 20 mg premix, 20 mg, Intravenous, Q12H, Judith Part, MD, Stopped at 05/17/19 2215 .  [START ON 05/19/2019] heparin injection 5,000 Units,  5,000 Units, Subcutaneous, Q8H, Emera Bussie A, MD .  HYDROcodone-acetaminophen (NORCO/VICODIN) 5-325 MG per tablet 1 tablet, 1 tablet, Oral, Q4H PRN, Judith Part, MD, 1 tablet at 05/17/19 2132 .  HYDROmorphone (DILAUDID) injection 0.5 mg, 0.5 mg, Intravenous, Q3H PRN, Judith Part, MD, 0.5 mg at 05/18/19 [id number] .  labetalol (NORMODYNE) injection 10-40 mg, 10-40 mg, Intravenous, Q10 min PRN, Judith Part, MD, 20 mg at 05/17/19 1659 .  ondansetron (ZOFRAN) tablet 4 mg, 4 mg, Oral, Q4H PRN **OR** ondansetron (ZOFRAN) injection 4 mg, 4 mg, Intravenous, Q4H PRN, Kyrel Leighton A, MD .  polyethylene glycol (MIRALAX / GLYCOLAX) packet 17 g, 17 g, Oral, Daily PRN, Judith Part, MD .  promethazine (PHENERGAN) tablet 12.5-25 mg, 12.5-25 mg, Oral, Q4H PRN, Judith Part, MD .  remifentanil (ULTIVA) 2 mg in 100 mL normal saline (20 mcg/mL) Optime, 0.0125 mcg/kg/min, Intravenous, To OR, Jhair Witherington, Joyice Faster, MD   Physical Exam: AOx3, PERRL, EOMI, FS, Strength 5/5 x4, SILT except for R face > RUE > RLE numbness, incision c/d/i  Assessment & Plan: 54 y.o. woman s/p suboccipital craniotomy for tumor resection, recovering well. MRI w/wo shows expected post-op changes.   -transfer to stepdown, d/c A-line,  d/c foley -PT/OT -  ISS & CBG qac/qhs -activity as tolerated -SCDs/TEDs  Judith Part  05/18/19 9:02 AM

## 2019-05-19 LAB — GLUCOSE, CAPILLARY: Glucose-Capillary: 111 mg/dL — ABNORMAL HIGH (ref 70–99)

## 2019-05-19 MED ORDER — OXYCODONE HCL 5 MG PO TABS: 5.0000 mg | ORAL_TABLET | ORAL | 0 refills | Status: DC | PRN

## 2019-05-19 NOTE — Discharge Instructions (Signed)
Discharge Instructions ° °No restriction in activities, slowly increase your activity back to normal.  ° °Your incision is closed with dermabond (purple glue). This will naturally fall off over the next 1-2 weeks.  ° °Okay to shower on the day of discharge. Use regular soap and water and try to be gentle when cleaning your incision.  ° °Follow up with Dr. Aaleeyah Bias in 2 weeks after discharge. If you do not already have a discharge appointment, please call his office at [phone] to schedule a follow up appointment. If you have any concerns or questions, please call the office and let us know. °

## 2019-05-19 NOTE — Discharge Summary (Signed)
Discharge Summary  Date of Admission: 05/17/2019  Date of Discharge: 05/19/19  Attending Physician: Emelda Brothers, MD  Hospital Course: Patient was admitted following an uncomplicated suboccipital craniotomy for planned resection of an intra-ventricular tumor. 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 She was recovered in PACU and transferred to 4N. 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 The patient was discharged home on 05/19/2019. She will follow up in clinic with me in 2 weeks.  Neurologic exam at discharge:  AOx3, PERRL, EOMI, FS, TM Strength 5/5 x4, SILT on L, diffuse R face/hemibody numbnes, no drift  Discharge diagnosis: Brainstem tumor  Caitlin Part, MD 05/19/19 8:35 AM

## 2019-05-19 NOTE — Plan of Care (Signed)
Patient doing well and will be discharged today

## 2019-05-19 NOTE — Progress Notes (Signed)
Neurosurgery Service Progress Note  Subjective: No acute events overnight, feeling overall better  Objective: Vitals:   05/19/19 0334 05/19/19 0404 05/19/19 0454 05/19/19 0724  BP:  (!) 150/96  (!) 154/96  Pulse: 85 95 65 87  Resp: 15 (!) 26 12 13   Temp:  97.6 F (36.4 C)  98.6 F (37 C)  TempSrc:  Oral  Oral  SpO2:    95%  Weight:      Height:       Temp (24hrs), Avg:98.5 F (36.9 C), Min:97.6 F (36.4 C), Max:99.7 F (37.6 C)  CBC Latest Ref Rng & Units 05/17/2019 05/13/2019 04/15/2019  WBC 4.0 - 10.5 K/uL 4.2 3.1(L) 2.8(L)  Hemoglobin 12.0 - 15.0 g/dL 12.0 12.1 12.0  Hematocrit 36.0 - 46.0 % 36.5 36.9 35.9(L)  Platelets 150 - 400 K/uL 233 194 205   BMP Latest Ref Rng & Units 05/17/2019 05/13/2019 04/15/2019  Glucose 70 - 99 mg/dL - 115(H) 111(H)  BUN 6 - 20 mg/dL - 8 13  Creatinine 0.44 - 1.00 mg/dL 0.78 0.75 0.78  Sodium 135 - 145 mmol/L - 140 139  Potassium 3.5 - 5.1 mmol/L - 3.5 3.6  Chloride 98 - 111 mmol/L - 106 104  CO2 22 - 32 mmol/L - 26 26  Calcium 8.9 - 10.3 mg/dL - 8.2(L) 8.8(L)    Intake/Output Summary (Last 24 hours) at 05/19/2019 [id number] Last data filed at 05/18/2019 1900 Gross per 24 hour  Intake 240 ml  Output --  Net 240 ml    Current Facility-Administered Medications:  .  acetaminophen (TYLENOL) tablet 650 mg, 650 mg, Oral, Q4H PRN, 650 mg at 05/19/19 0334 **OR** acetaminophen (TYLENOL) suppository 650 mg, 650 mg, Rectal, Q4H PRN, Judith Part, MD .  Chlorhexidine Gluconate Cloth 2 % PADS 6 each, 6 each, Topical, Daily, Judith Part, MD, 6 each at 05/18/19 1000 .  docusate sodium (COLACE) capsule 100 mg, 100 mg, Oral, BID, Judith Part, MD, 100 mg at 05/18/19 2156 .  famotidine (PEPCID) tablet 20 mg, 20 mg, Oral, BID, Tedra Coppernoll A, MD, 20 mg at 05/18/19 2156 .  heparin injection 5,000 Units, 5,000 Units, Subcutaneous, Q8H, Judith Part, MD, 5,000 Units at 05/19/19 0601 .  HYDROcodone-acetaminophen (NORCO/VICODIN) 5-325  MG per tablet 1 tablet, 1 tablet, Oral, Q4H PRN, Judith Part, MD, 1 tablet at 05/18/19 2156 .  HYDROmorphone (DILAUDID) injection 0.5 mg, 0.5 mg, Intravenous, Q3H PRN, Judith Part, MD, 0.5 mg at 05/19/19 0057 .  insulin aspart (novoLOG) injection 0-15 Units, 0-15 Units, Subcutaneous, TID WC, Kniyah Khun A, MD .  labetalol (NORMODYNE) injection 10-40 mg, 10-40 mg, Intravenous, Q10 min PRN, Judith Part, MD, 20 mg at 05/17/19 1659 .  ondansetron (ZOFRAN) tablet 4 mg, 4 mg, Oral, Q4H PRN **OR** ondansetron (ZOFRAN) injection 4 mg, 4 mg, Intravenous, Q4H PRN, Orra Nolde A, MD .  polyethylene glycol (MIRALAX / GLYCOLAX) packet 17 g, 17 g, Oral, Daily PRN, Haylin Camilli A, MD .  promethazine (PHENERGAN) tablet 12.5-25 mg, 12.5-25 mg, Oral, Q4H PRN, Judith Part, MD   Physical Exam: AOx3, PERRL, EOMI, FS, Strength 5/5 x4, SILT except for R face > RUE > RLE numbness, incision c/d/i  Assessment & Plan: 54 y.o. woman s/p suboccipital craniotomy for tumor resection, recovering well. MRI w/wo shows expected post-op changes.   -discharge home today  Judith Part  05/19/19 8:32 AM

## 2019-05-21 ENCOUNTER — Telehealth: Payer: Self-pay

## 2019-05-21 ENCOUNTER — Other Ambulatory Visit: Payer: Self-pay

## 2019-05-21 DIAGNOSIS — C7951 Secondary malignant neoplasm of bone: Secondary | ICD-10-CM

## 2019-05-21 DIAGNOSIS — Z170 Estrogen receptor positive status [ER+]: Secondary | ICD-10-CM

## 2019-05-21 DIAGNOSIS — R110 Nausea: Secondary | ICD-10-CM

## 2019-05-21 DIAGNOSIS — C50211 Malignant neoplasm of upper-inner quadrant of right female breast: Secondary | ICD-10-CM

## 2019-05-21 MED ORDER — PROCHLORPERAZINE MALEATE 10 MG PO TABS: 10.0000 mg | ORAL_TABLET | Freq: Four times a day (QID) | ORAL | 2 refills | Status: DC | PRN

## 2019-05-21 NOTE — Telephone Encounter (Signed)
Pt called requesting refill for MS Contin and compazine. I will refill compazine.

## 2019-05-24 ENCOUNTER — Other Ambulatory Visit: Payer: Self-pay | Admitting: Radiation Therapy

## 2019-05-24 DIAGNOSIS — C7931 Secondary malignant neoplasm of brain: Secondary | ICD-10-CM

## 2019-05-24 DIAGNOSIS — C7949 Secondary malignant neoplasm of other parts of nervous system: Secondary | ICD-10-CM

## 2019-05-25 ENCOUNTER — Telehealth: Payer: Self-pay

## 2019-05-25 ENCOUNTER — Other Ambulatory Visit: Payer: Self-pay | Admitting: Nurse Practitioner

## 2019-05-25 ENCOUNTER — Telehealth: Payer: Self-pay | Admitting: Radiation Therapy

## 2019-05-25 LAB — SURGICAL PATHOLOGY

## 2019-05-25 MED ORDER — MORPHINE SULFATE ER 15 MG PO TBCR: 15.0000 mg | EXTENDED_RELEASE_TABLET | Freq: Every day | ORAL | 0 refills | Status: DC

## 2019-05-25 NOTE — Telephone Encounter (Signed)
Caitlin Cohen called requesting refill for [name].

## 2019-05-25 NOTE — Telephone Encounter (Signed)
I spoke with Ms. Caitlin Cohen this morning about her upcoming treatment planning appointments. She is doing very well after surgery, and is on board with the MRI on 2/15 and visit with Dr. Isidore Moos followed by [facility] on 2/16.   Mont Dutton R.T.(R)(T) Radiation Special Procedures Navigator

## 2019-05-25 NOTE — Telephone Encounter (Signed)
Done,  Thanks, LB

## 2019-05-27 NOTE — Progress Notes (Signed)
Location/Histology of Brain Tumor:  05/17/19 FINAL MICROSCOPIC DIAGNOSIS: A. BRAIN, TUMOR, BIOPSY: - Metastatic carcinoma, see comment.  Patient presented with symptoms of:  She reported itching and numbness to the right side of her face on 04/15/19 to Dr. Burr Medico and she ordered a MRI brain.   Past or anticipated interventions, if any, per neurosurgery:  05/17/19 PROCEDURE:Suboccipital craniotomy, partial resection of brainstem mass SURGEON: Surgeon(s) and Role: Judith Part, MD - Primary Kary Kos, MD - Assisting  Past or anticipated interventions, if any, per medical oncology:  05/04/19 Dr. Mickeal Skinner Assessment/Plan Brain Mass Ms. Brando present with clinical and radiographic syndrome localizing to the right posterior medulla.  Etiology is either primary CNS neoplasm or metastatic process secondary to known stage IV breast cancer.  Although metastasis is more common/likely, long duration of stable symptoms and certain radiographic features are less consistent.  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  We discussed her case in brain/spine tumor board this week, and surgical resection was recommended.  We discussed this with Ms. Allgyer and explained the difficulty of treating such a lesion without clear histologic guidance.  She expressed understanding and willingness to proceed with surgery.  Dose of Decadron, if applicable: N/A  Recent neurologic symptoms, if any:   Seizures: No  Headaches: No  Nausea: No  Dizziness/ataxia: No  Difficulty with hand coordination: No  Focal numbness/weakness: She continues to report numbness to her right face, and right side. She tells me that it is not as prominent as when first diagnosed.   Visual deficits/changes: No  Confusion/Memory deficits: No  Painful bone metastases at present, if any: N/A  SAFETY ISSUES:  Prior radiation? Yes,  Radiation treatment dates:   11/10/2017 to  12/08/2017 Site/dose:    1. The Right breast was treated to 40.05 Gy in 15 fractions of 2.67 Gy. 2. The Right breast was boosted to 10 Gy in 5 fractions of 2 Gy.  And: Radiation Treatment Dates: 04/15/1898 through 12/18/2018 Site Technique Total Dose Dose per Fx Completed Fx Beam Energies  Pelvis: Pelvis_Rt 3D 20/20 4 5/5 15X  Spine: Spine_L1-3 3D 30/30 3 10/10 15X  Thorax: Chest_Lt_ribs 3D 30/30 3 10/10 10X     Pacemaker/ICD? No  Possible current pregnancy? No  Is the patient on methotrexate? No  Additional Complaints / other details:   BP 135/82 (BP Location: Left Arm, Patient Position: Sitting, Cuff Size: Large)   Pulse (!) 101   Temp 98.2 F (36.8 C)   Resp 20   Wt 275 lb 3.2 oz (124.8 kg)   SpO2 98%   BMI 43.10 kg/m    Wt Readings from Last 3 Encounters:  06/01/19 275 lb 3.2 oz (124.8 kg)  05/17/19 276 lb 7.3 oz (125.4 kg)  05/13/19 276 lb 6.4 oz (125.4 kg)

## 2019-05-31 ENCOUNTER — Ambulatory Visit (HOSPITAL_COMMUNITY)
Admission: RE | Admit: 2019-05-31 | Discharge: 2019-05-31 | Disposition: A | Discharge status: Home / Self Care | Payer: Medicaid Other | Source: Ambulatory Visit | Attending: Radiation Oncology | Admitting: Radiation Oncology

## 2019-05-31 ENCOUNTER — Other Ambulatory Visit: Payer: Self-pay

## 2019-05-31 DIAGNOSIS — C7949 Secondary malignant neoplasm of other parts of nervous system: Secondary | ICD-10-CM | POA: Insufficient documentation

## 2019-05-31 DIAGNOSIS — C7931 Secondary malignant neoplasm of brain: Secondary | ICD-10-CM | POA: Diagnosis not present

## 2019-05-31 MED ORDER — GADOBUTROL 1 MMOL/ML IV SOLN
10.0000 mL | Freq: Once | INTRAVENOUS | Status: AC | PRN
  Administered 2019-05-31: 14:00:00 10 mL via INTRAVENOUS

## 2019-06-01 ENCOUNTER — Other Ambulatory Visit: Payer: Self-pay

## 2019-06-01 ENCOUNTER — Ambulatory Visit
Admission: RE | Admit: 2019-06-01 | Discharge: 2019-06-01 | Disposition: A | Discharge status: Home / Self Care | Payer: Medicaid Other | Source: Ambulatory Visit | Attending: Radiation Oncology | Admitting: Radiation Oncology

## 2019-06-01 ENCOUNTER — Encounter: Payer: Self-pay | Admitting: Radiation Oncology

## 2019-06-01 VITALS — BP 135/82 | HR 101 | Temp 98.2°F | Resp 20 | Wt 275.2 lb

## 2019-06-01 DIAGNOSIS — C50211 Malignant neoplasm of upper-inner quadrant of right female breast: Secondary | ICD-10-CM | POA: Diagnosis not present

## 2019-06-01 DIAGNOSIS — C7931 Secondary malignant neoplasm of brain: Secondary | ICD-10-CM

## 2019-06-01 DIAGNOSIS — C7949 Secondary malignant neoplasm of other parts of nervous system: Secondary | ICD-10-CM

## 2019-06-01 DIAGNOSIS — Z510 Encounter for antineoplastic radiation therapy: Secondary | ICD-10-CM | POA: Insufficient documentation

## 2019-06-01 NOTE — Progress Notes (Signed)
Radiation Oncology         [phone] ________________________________  Outpatient Re-Evaluation in person  Name: Caitlin Cohen MRN: [id number]  Date: 06/01/2019  DOB: November 13, 1965  FI:EPPIRJJ, No Pcp Per  Lucianne Lei, MD   REFERRING PHYSICIAN: Lucianne Lei, MD  DIAGNOSIS:    ICD-10-CM   1. Brain metastasis ([place])  C79.31     HISTORY OF PRESENT ILLNESS::Caitlin Cohen is a 54 y.o. female who returns today for evaluation of brain metastases. This patient is well known by me. I initially met her in the multidisciplinary breast clinic in 06/2017. I treated her right breast later that year. She returned to me in 11/2018 with bone metastases, and I treated her right pelvis, lumbar spine, and left ribs. She has been followed in medical oncology by Dr. Burr Medico. Most recently, she was on monthly Xgeva, monthly Zoladex, tamoxifen, and Ibrance in 03/2019.  At visit with Dr. Burr Medico on 04/15/2019, she reported itching and numbness to the right side of her face starting in 01/2019 -this has been in all 3 dermatomal distributions of the face but somewhat patchy.  She also noticed some asymmetric worsening of her peripheral neuropathy; she was referred for brain MRI on 04/29/2019, which revealed: 1.9 cm mass of inferior fourth ventricle, extending at foramen of Luschka on right, most worrisome for ependymoma.  She was referred to Dr. Mickeal Skinner, and her case was also discussed at the multidisciplinary tumor board. The recommendation was to proceed with resection due to the unknown histology.  Per our discussion, metastatic disease was also of concern.  Performed on 05/17/2019 under Dr. Zada Finders, pathology from the suboccipital craniotomy revealed metastatic breast carcinoma, ER/PR positive and Her2 negative.  MRI of the brain yesterday, 05/31/2019, demonstrated postsurgical changes from partial resection of medullary lesion with persistent nodular contrast enhancement; interval development of contrast enhancement  in anterior inferior aspect of cerebellum, in area of previously seen restricted diffusion, consistent with post ischemic changes although concurrent tumor extension cannot be entirely excluded.  No other sites of metastatic disease in the brain.  I personally reviewed her images.  She feels that her numbness has somewhat improved since surgery.  Dose of Decadron, if applicable: N/A  Recent neurologic symptoms, if any:   Seizures: No  Headaches: No  Nausea: No  Difficulty with hand coordination: No  Focal numbness/weakness: She continues to report numbness to her right face, and right side. She tells me that it is not as prominent as when first diagnosed.   Visual deficits/changes: No  Confusion/Memory deficits: No  She uses a cane to ambulate.  She also acknowledges that when she walks without her cane she tends to veer off to the right side   PREVIOUS RADIATION THERAPY: Yes  12/02/2018 - 12/18/2018:   1. Right Pelvis / 20 Gy in 5 fractions  2. L1-3 / 30 Gy in 10 fractions  3. Left Ribs / 30 Gy in 3 fractions 11/10/2017 - 12/08/2017:  1. Right Breast / 40.05 Gy in 15 fractions  2. Right Breast Boost / 10 Gy in 5 fractions  PAST MEDICAL HISTORY:  has a past medical history of Anemia, Anxiety, Cancer ([place]) (dx'd 06/2017), Depression, Diabetes mellitus without complication ([place]), GERD (gastroesophageal reflux disease), Headache, History of radiation therapy (11/10/17- 12/08/17), Metastatic cancer to bone ([place]) (dx'd 09/2018), Personal history of chemotherapy, and Personal history of radiation therapy.    PAST SURGICAL HISTORY: Past Surgical History:  Procedure Laterality Date  . APPLICATION OF CRANIAL NAVIGATION N/A 05/17/2019  Procedure: APPLICATION OF CRANIAL NAVIGATION;  Surgeon: Judith Part, MD;  Location: [facility];  Service: Neurosurgery;  Laterality: N/A;  . CERVICAL CONE BIOPSY    . CHOLECYSTECTOMY    . CRANIOTOMY N/A 05/17/2019   Procedure: Suboccipital craniotomy for tumor  resection with brainlab;  Surgeon: Judith Part, MD;  Location: [facility];  Service: Neurosurgery;  Laterality: N/A;  posterior/suboccipital  . IR RADIOLOGIST EVAL & MGMT  11/03/2018  . KYPHOPLASTY N/A 11/12/2018   Procedure: LUMBAR TWO  BIOPSY AND LUMBAR TWO KYPHOPLASTY;  Surgeon: Melina Schools, MD;  Location: [facility];  Service: Orthopedics;  Laterality: N/A;  90 mins  . MASTECTOMY WITH RADIOACTIVE SEED GUIDED EXCISION AND AXILLARY SENTINEL LYMPH NODE BIOPSY Right 07/08/2017   Procedure: RIGHT LUMPTECTOMY WITH RADIOACTIVE SEED GUIDED EXCISION AND RIGHT AXILLARY SENTINEL LYMPH NODE BIOPSY ERAS PATHWAY;  Surgeon: [name], MD;  Location: [facility];  Service: General;  Laterality: Right;  PEC BLOCK  . OPEN REDUCTION INTERNAL FIXATION (ORIF) DISTAL RADIAL FRACTURE  03/17/2012   Procedure: OPEN REDUCTION INTERNAL FIXATION (ORIF) DISTAL RADIAL FRACTURE;  Surgeon: Roseanne Kaufman, MD;  Location: [facility];  Service: Orthopedics;  Laterality: Right;  Pre-operative a supra-clavical block right arm in addition to general anesthesia  . PORT-A-CATH REMOVAL Right 02/02/2018   Procedure: REMOVAL PORT-A-CATH;  Surgeon: [name], MD;  Location: [facility];  Service: General;  Laterality: Right;  . PORTACATH PLACEMENT N/A 07/30/2017   Procedure: INSERTION PORT-A-CATH WITH [place];  Surgeon: [name], MD;  Location: [place];  Service: General;  Laterality: N/A;  . TUBAL LIGATION      FAMILY HISTORY: family history includes Diabetes in her father; Hypertension in her father and mother.  SOCIAL HISTORY:  reports that she has never smoked. She has never used smokeless tobacco. She reports current alcohol use. She reports that she does not use drugs.  ALLERGIES: Patient has no known allergies.  MEDICATIONS:  Current Outpatient Medications  Medication Sig Dispense Refill  . blood glucose meter kit and supplies KIT Dispense based on patient and insurance preference. Use up to four times  daily as directed. (FOR ICD-9 250.00, 250.01). 1 each 0  . gabapentin (NEURONTIN) 300 MG capsule Take 1 capsule morning and afternoon and 2 capsules at bedtime (Patient taking differently: Take 300-600 mg by mouth See admin instructions. Take 300 mg by mouth in the morning and afternoon and 600 mg at bedtime) 270 capsule 3  . IBRANCE 125 MG tablet TAKE 1 TABLET (125 MG TOTAL) BY MOUTH DAILY. TAKE FOR 21 DAYS ON, 7 DAYS OFF, REPEAT EVERY 28 DAYS. 21 tablet 0  . letrozole (FEMARA) 2.5 MG tablet TAKE 1 TABLET BY MOUTH EVERY DAY (Patient taking differently: Take 2.5 mg by mouth daily. ) 90 tablet 1  . MELATONIN ER PO Take 15 mg by mouth at bedtime as needed (sleep).     . metFORMIN (GLUCOPHAGE) 500 MG tablet TAKE 1 TABLET (500 MG TOTAL) BY MOUTH 2 (TWO) TIMES DAILY WITH A MEAL. 180 tablet 0  . morphine (MS CONTIN) 15 MG 12 hr tablet Take 1 tablet (15 mg total) by mouth at bedtime. 30 tablet 0  . Multiple Vitamin (MULTIVITAMIN WITH MINERALS) TABS tablet Take 1 tablet by mouth daily.    . ondansetron (ZOFRAN) 8 MG tablet TAKE 1 TABLET (8 MG TOTAL) BY MOUTH EVERY 8 (EIGHT) HOURS AS NEEDED FOR NAUSEA OR VOMITING. 30 tablet 3  . oxyCODONE (OXY IR/ROXICODONE) 5 MG immediate release tablet Take 1 tablet (  5 mg total) by mouth every 4 (four) hours as needed for severe pain. 30 tablet 0  . polyethylene glycol (MIRALAX) 17 g packet Take 17 g by mouth daily as needed. (Patient taking differently: Take 17 g by mouth daily as needed for mild constipation. ) 14 each 8  . potassium chloride SA (KLOR-CON M20) 20 MEQ tablet Take 1 tablet (20 mEq total) by mouth daily. 90 tablet 1  . prochlorperazine (COMPAZINE) 10 MG tablet Take 1 tablet (10 mg total) by mouth every 6 (six) hours as needed for nausea or vomiting. 30 tablet 2   No current facility-administered medications for this encounter.    REVIEW OF SYSTEMS:   As above   PHYSICAL EXAM:  weight is 275 lb 3.2 oz (124.8 kg). Her temperature is 98.2 F (36.8 C). Her  blood pressure is 135/82 and her pulse is 101 (abnormal). Her respiration is 20 and oxygen saturation is 98%.   General: Alert and oriented in no acute distress Neurologic: No slurring of speech.  No nystagmus.  Extraocular movements are intact.   She ambulates with a cane. She subjectively reports patchy numbness in different areas of the right face.  LABORATORY DATA:  Lab Results  Component Value Date   WBC 4.2 05/17/2019   HGB 12.0 05/17/2019   HCT 36.5 05/17/2019   MCV 97.9 05/17/2019   PLT 233 05/17/2019   CMP     Component Value Date/Time   NA 140 05/13/2019 0833   K 3.5 05/13/2019 0833   CL 106 05/13/2019 0833   CO2 26 05/13/2019 0833   GLUCOSE 115 (H) 05/13/2019 0833   BUN 8 05/13/2019 0833   CREATININE 0.78 05/17/2019 1726   CREATININE 0.78 04/15/2019 0816   CALCIUM 8.2 (L) 05/13/2019 0833   CALCIUM 6.9 (LL) 12/22/2018 1610   PROT 7.4 04/15/2019 0816   ALBUMIN 3.8 04/15/2019 0816   AST 16 04/15/2019 0816   ALT 9 04/15/2019 0816   ALKPHOS 61 04/15/2019 0816   BILITOT 0.4 04/15/2019 0816   GFRNONAA >60 05/17/2019 1726   GFRNONAA >60 04/15/2019 0816   GFRAA >60 05/17/2019 1726   GFRAA >60 04/15/2019 0816         RADIOGRAPHY: MR Brain W Wo Contrast  Result Date: 05/31/2019 CLINICAL DATA:  Follow-up for tumor resection. EXAM: MRI HEAD WITHOUT AND WITH CONTRAST TECHNIQUE: Multiplanar, multiecho pulse sequences of the brain and surrounding structures were obtained without and with intravenous contrast. CONTRAST:  54m GADAVIST GADOBUTROL 1 MMOL/ML IV SOLN COMPARISON:  MRI of the brain May 17, 2019 FINDINGS: Brain: Postsurgical changes from suboccipital craniotomy for partial resection of a right lateral medullary metastatic lesion. Susceptibility artifact as well as a seroma and residual air in the posterior soft tissues are again noted with interval decrease of the amount of air with increasing the proportion of fluid. Redemonstrated surgical cavity in the medulla  eccentric to the right with fluid level likely representing residual blood products and pneumocephalus. There is residual nodular contrast enhancement measuring approximately 2.0 x 1.2 x 1.2 cm, similar to prior MRI. There has been interval development of contrast enhancement in the anterior inferior aspect of the cerebellum, in the area of previously seen restricted diffusion. While this most likely corresponds to post ischemic changes, tumor extension cannot be entirely excluded. No foci of abnormal contrast enhancement seen elsewhere in the brain. No hydrocephalus or acute infarct. Vascular: Normal flow voids. Skull and upper cervical spine: Incompletely characterized hypodense lesion is seen anterior aspects of  C4. Sinuses/Orbits: Mucous retention cyst in the left antrum Other: None. IMPRESSION: 1. Postsurgical changes from partial resection of a medullary lesion with persistent nodular contrast enhancement, not significantly changed from prior MRI. 2. Interval development of contrast enhancement in the anterior inferior aspect of the cerebellum, in the area of previously seen restricted diffusion, consistent with post ischemic changes, although concurrent tumor extension cannot be entirely excluded. Electronically Signed   By: Pedro Earls M.D.   On: 05/31/2019 15:09   MR BRAIN W WO CONTRAST  Result Date: 05/18/2019 CLINICAL DATA:  Intracranial mass status post biopsy. EXAM: MRI HEAD WITHOUT AND WITH CONTRAST TECHNIQUE: Multiplanar, multiecho pulse sequences of the brain and surrounding structures were obtained without and with intravenous contrast. CONTRAST:  76m GADAVIST GADOBUTROL 1 MMOL/ML IV SOLN COMPARISON:  Brain MRI 04/29/2019 FINDINGS: Brain: Small area diffusion restriction within the medial right cerebellar hemisphere adjacent to the foramina of Luschka. There areas of anti dependent magnetic susceptibility effect, likely indicating pneumocephalus. There are postsurgical changes  of posterior fossa approach to resection of the intraventricular neoplasm. There is a small amount of residual contrast enhancement within the dorsal parenchyma at the level of the cervicomedullary junction. The bulk of the mass has been resected. Vascular: Normal flow voids Skull and upper cervical spine: Status post suboccipital craniectomy Sinuses/Orbits: Bilateral maxillary sinus mucosal thickening. Other: None IMPRESSION: 1. Status post fourth ventricle lesion resection. Small amount of residual contrast enhancement within the dorsal parenchyma at the level of the cervicomedullary junction, favored to be postsurgical. This will serve as a baseline for future studies. 2. Small focus of acute ischemia within the medial right cerebellar hemisphere. 3. Pneumocephalus. Electronically Signed   By: KUlyses JarredM.D.   On: 05/18/2019 00:12      IMPRESSION/PLAN: This is a very pleasant 54y.o. woman with metastatic disease to the brain.  I had a lengthy discussion with the patient after reviewing their MRI results with them.  We spoke about whole brain radiotherapy versus posterior fossa radiotherapy versus stereotactic radiosurgery to the brain. We spoke about the differing risks benefits and side effects of both of these treatments.  While whole brain radiotherapy may unnecessarily expose her whole brain to potential side effects of treatment, stereotactic radiosurgery may be too focused and bring a heightened risk of marginal recurrence adjacent to the surgical bed.  Based on tumor board discussion, the best fit for her treatment would potentially be 3D conformal radiotherapy to the posterior fossa.  This would be given to a dose of 35 Gray in 14 fractions.  She understands that this treatment would cause partial scalp alopecia which I expect will improve with time.  This could also cause headaches, nausea, worsening neurologic symptoms, and fatigue, all of which I believe are likely to improve with time.   Cognitive decline could potentially occur with this treatment but be less noticeable than if we treated her whole brain it is usually mild.  She is enthusiastic about proceeding with this treatment and understands there are no guarantees from treatment.  Consent form has been signed and placed in her chart  CT simulation will take place today and treatment in less than a week.   On date of service, in total, I spent 50 minutes on this encounter.  Patient was seen in person.  __________________________________________   SEppie Gibson MD  This document serves as a record of services personally performed by SEppie Gibson MD. It was created on her behalf by [facility], a trained medical scribe. The creation of this record is based on the scribe's personal observations and the provider's statements to them. This document has been checked and approved by the attending provider.

## 2019-06-02 ENCOUNTER — Encounter: Payer: Self-pay | Admitting: Radiation Oncology

## 2019-06-02 DIAGNOSIS — C7931 Secondary malignant neoplasm of brain: Secondary | ICD-10-CM | POA: Insufficient documentation

## 2019-06-02 NOTE — Addendum Note (Signed)
Encounter addended by: [name], MD on: 06/02/2019 9:02 AM  Actions taken: Problem List modified, Visit diagnoses modified, Clinical Note Signed

## 2019-06-02 NOTE — Progress Notes (Addendum)
  SIMULATION AND TREATMENT PLANNING NOTE  Outpatient  DIAGNOSIS:     ICD-10-CM   1. Brain metastasis ([place])  C79.31     NARRATIVE:  The patient was brought to the [facility].  Identity was confirmed.  All relevant records and images related to the planned course of therapy were reviewed.  The patient freely provided informed written consent to proceed with treatment after reviewing the details related to the planned course of therapy. The consent form was witnessed and verified by the simulation staff.    Then, the patient was set-up in a stable reproducible  supine position for radiation therapy.  An Aquaplast facemask was custom fitted to the patient's anatomy.  CT images were obtained.  Surface markings were placed.  The CT images were loaded into the planning software.      TREATMENT PLANNING NOTE: Treatment planning then occurred.  The radiation prescription was entered and confirmed.    A total of 3 medically necessary complex treatment devices were fabricated and supervised by me, in the form of an Aquaplast mask and 2 fields with MLCs to block normal brain, globes, pharynx, spinal cord, lenses. Wedges will be used as needed for dose homogeneity.  MORE FIELDS WITH MLCs MAY BE ADDED IN DOSIMETRY for dose homogeneity.  I have requested : 3D plan which is medically necessary to minimize exposure to normal supratentorial brain. DVH requested of normal brain, brainstem, spinal cord, posterior fossa.  The patient will receive 35 Gy in 14 fractions to the brain stem/posterior fossa.  -----------------------------------  [name], MD

## 2019-06-03 ENCOUNTER — Ambulatory Visit: Payer: Medicaid Other | Admitting: Radiation Oncology

## 2019-06-03 ENCOUNTER — Ambulatory Visit: Payer: Medicaid Other

## 2019-06-04 ENCOUNTER — Ambulatory Visit: Payer: Medicaid Other

## 2019-06-04 DIAGNOSIS — Z510 Encounter for antineoplastic radiation therapy: Secondary | ICD-10-CM | POA: Diagnosis not present

## 2019-06-07 ENCOUNTER — Other Ambulatory Visit: Payer: Self-pay

## 2019-06-07 ENCOUNTER — Other Ambulatory Visit: Payer: Self-pay | Admitting: Hematology

## 2019-06-07 ENCOUNTER — Ambulatory Visit (HOSPITAL_COMMUNITY)
Admission: RE | Admit: 2019-06-07 | Discharge: 2019-06-07 | Disposition: A | Discharge status: Home / Self Care | Payer: Medicaid Other | Source: Ambulatory Visit | Attending: Hematology | Admitting: Hematology

## 2019-06-07 ENCOUNTER — Ambulatory Visit
Admission: RE | Admit: 2019-06-07 | Discharge: 2019-06-07 | Disposition: A | Discharge status: Home / Self Care | Payer: Medicaid Other | Source: Ambulatory Visit | Attending: Radiation Oncology | Admitting: Radiation Oncology

## 2019-06-07 ENCOUNTER — Inpatient Hospital Stay: Payer: Medicaid Other | Attending: Hematology

## 2019-06-07 ENCOUNTER — Ambulatory Visit: Payer: Medicaid Other

## 2019-06-07 DIAGNOSIS — C50211 Malignant neoplasm of upper-inner quadrant of right female breast: Secondary | ICD-10-CM | POA: Insufficient documentation

## 2019-06-07 DIAGNOSIS — Z170 Estrogen receptor positive status [ER+]: Secondary | ICD-10-CM | POA: Insufficient documentation

## 2019-06-07 DIAGNOSIS — Z510 Encounter for antineoplastic radiation therapy: Secondary | ICD-10-CM | POA: Diagnosis not present

## 2019-06-07 DIAGNOSIS — C7951 Secondary malignant neoplasm of bone: Secondary | ICD-10-CM | POA: Insufficient documentation

## 2019-06-07 DIAGNOSIS — C7931 Secondary malignant neoplasm of brain: Secondary | ICD-10-CM | POA: Insufficient documentation

## 2019-06-07 DIAGNOSIS — Z5111 Encounter for antineoplastic chemotherapy: Secondary | ICD-10-CM | POA: Diagnosis not present

## 2019-06-07 LAB — CBC WITH DIFFERENTIAL (CANCER CENTER ONLY)
Abs Immature Granulocytes: 0.01 10*3/uL (ref 0.00–0.07)
Basophils Absolute: 0 10*3/uL (ref 0.0–0.1)
Basophils Relative: 1 %
Eosinophils Absolute: 0.1 10*3/uL (ref 0.0–0.5)
Eosinophils Relative: 2 %
HCT: 37.9 % (ref 36.0–46.0)
Hemoglobin: 12.4 g/dL (ref 12.0–15.0)
Immature Granulocytes: 0 %
Lymphocytes Relative: 11 %
Lymphs Abs: 0.8 10*3/uL (ref 0.7–4.0)
MCH: 31.6 pg (ref 26.0–34.0)
MCHC: 32.7 g/dL (ref 30.0–36.0)
MCV: 96.7 fL (ref 80.0–100.0)
Monocytes Absolute: 0.1 10*3/uL (ref 0.1–1.0)
Monocytes Relative: 2 %
Neutro Abs: 5.6 10*3/uL (ref 1.7–7.7)
Neutrophils Relative %: 84 %
Platelet Count: 307 10*3/uL (ref 150–400)
RBC: 3.92 MIL/uL (ref 3.87–5.11)
RDW: 13.2 % (ref 11.5–15.5)
WBC Count: 6.6 10*3/uL (ref 4.0–10.5)
nRBC: 0 % (ref 0.0–0.2)

## 2019-06-07 LAB — CMP (CANCER CENTER ONLY)
ALT: 18 U/L (ref 0–44)
AST: 17 U/L (ref 15–41)
Albumin: 3.9 g/dL (ref 3.5–5.0)
Alkaline Phosphatase: 65 U/L (ref 38–126)
Anion gap: 11 (ref 5–15)
BUN: 15 mg/dL (ref 6–20)
CO2: 26 mmol/L (ref 22–32)
Calcium: 9 mg/dL (ref 8.9–10.3)
Chloride: 103 mmol/L (ref 98–111)
Creatinine: 0.82 mg/dL (ref 0.44–1.00)
GFR, Est AFR Am: >60 mL/min (ref >60)
GFR, Estimated: >60 mL/min (ref >60)
Glucose, Bld: 109 mg/dL — ABNORMAL HIGH (ref 70–99)
Potassium: 3.9 mmol/L (ref 3.5–5.1)
Sodium: 140 mmol/L (ref 135–145)
Total Bilirubin: 0.3 mg/dL (ref 0.3–1.2)
Total Protein: 8 g/dL (ref 6.5–8.1)

## 2019-06-07 MED ORDER — OXYCODONE HCL 5 MG PO TABS: 5.0000 mg | ORAL_TABLET | Freq: Three times a day (TID) | ORAL | 0 refills | Status: DC | PRN

## 2019-06-07 MED ORDER — TECHNETIUM TC 99M MEDRONATE IV KIT
20.3000 | PACK | Freq: Once | INTRAVENOUS | Status: AC | PRN
  Administered 2019-06-07: 20.3 via INTRAVENOUS

## 2019-06-07 MED ORDER — IOHEXOL 300 MG/ML  SOLN
100.0000 mL | Freq: Once | INTRAMUSCULAR | Status: AC | PRN
  Administered 2019-06-07: 100 mL via INTRAVENOUS

## 2019-06-07 MED ORDER — SODIUM CHLORIDE (PF) 0.9 % IJ SOLN
INTRAMUSCULAR | Status: AC
  Filled 2019-06-07: qty 50

## 2019-06-07 NOTE — Progress Notes (Signed)
[name]   [phone] [phone]   Clinic Follow up Note   Patient Care Team: Patient, No Pcp Per as PCP - General ([name]) Truitt Merle, MD as Consulting Physician (Hematology) [name], MD as Consulting Physician (General Surgery) [name], MD as Attending Physician (Radiation Oncology) [name], NP as Nurse Practitioner (Nurse Practitioner)  Date of Service:  06/10/2019  CHIEF COMPLAINT: Follow upofmetastaticright breastcancer  SUMMARY OF ONCOLOGIC HISTORY: Oncology History Overview Note  Cancer Staging Malignant neoplasm of upper-inner quadrant of right breast in female, estrogen receptor positive ([place]) Staging form: Breast, AJCC 8th Edition - Clinical stage from 06/26/2017: Stage IIA (cT2, cN0, cM0, G3, ER: Positive, PR: Positive, HER2: Negative) - Signed by Truitt Merle, MD on 07/01/2017 - Pathologic stage from 07/08/2017: Stage IB (pT2, pN0, cM0, G3, ER+, PR+, HER2-, Oncotype DX score: 29) - Signed by [name], NP on 07/25/2017     Malignant neoplasm of upper-inner quadrant of right breast in female, estrogen receptor positive ([place])  06/23/2017 Mammogram   IMPRESSION: 1. There is a highly suspicious mass measuring 3.5 cm mammographically in the right breast at the palpable site identified by the patient. 2. There is 1 suspicious lymph node with cortex measuring up to 6 mm. 3.  No mammographic evidence of malignancy in the left breast.   06/26/2017 Initial Biopsy   Diagnosis 06/26/17 1. Breast, right, needle core biopsy, upper inner quadrant, 12:30 o'clock, 7cm from nipple - INVASIVE DUCTAL CARCINOMA - SEE COMMENT 2. Lymph node, needle/core biopsy, right axillary (level 2 node) - NO CARCINOMA IDENTIFIED IN ONE LYMPH NODE (0/1)   06/26/2017 Receptors her2   Prognostic indicators significant for: ER, 70% positive and PR, 70% positive, both with strong staining intensity. Proliferation marker Ki67 at  60%. HER2 negative.   07/01/2017 Initial Diagnosis   Malignant neoplasm of upper-inner quadrant of right breast in female, estrogen receptor positive ([name])   07/08/2017 Pathology Results   Diagnosis 1. Breast, lumpectomy, Right - INVASIVE DUCTAL CARCINOMA, NOTTINGHAM GRADE 3 OF 3, 3.5 CM - MARGINS UNINVOLVED BY CARCINOMA (0.1 CM, SUPERIOR MARGIN) - PREVIOUS BIOPSY SITE CHANGES - SEE ONCOLOGY TABLE BELOW 2. Soft tissue, biopsy, Axillary - BENIGN FIBROADIPOSE TISSUE - NO MALIGNANCY IDENTIFIED 3. Lymph node, sentinel, biopsy, Right axillary - NO CARCINOMA IDENTIFIED IN ONE LYMPH NODE (0/1) 4. Lymph node, sentinel, biopsy, Right axillary - NO CARCINOMA IDENTIFIED IN ONE LYMPH NODE (0/1) - PREVIOUS BIOPSY SITE CHANGES - SEE COMMENT 5. Breast, excision, Right superior margin - BENIGN BREAST TISSUE - NO RESIDUAL CARCINOMA IDENTIFIED 6. Breast, excision, Right inferior margin - BENIGN BREAST TISSUE - NO RESIDUAL CARCINOMA IDENTIFIED   07/08/2017 Cancer Staging   Staging form: Breast, AJCC 8th Edition - Pathologic stage from 07/08/2017: Stage IB (pT2, pN0, cM0, G3, ER+, PR+, HER2-, Oncotype DX score: 29) - Signed by [name], NP on 07/25/2017   07/22/2017 Oncotype testing   Recurrence Score: 29 Distant Recurrence Risk at 9 years with Ai or Tamoxifen alone: 18% Absolute Chemotherapy Benefit: > 15 %   08/08/2017 -  Chemotherapy   Adjuvant chemo Taxotere/Cytoxanq3 weeks x4 cycles with Udenyca -Granix added from cycle 2. Discontinued Udenyca following cycle 2.    11/10/2017 - 12/08/2017 Radiation Therapy   Radiation treatment dates:   11/10/2017 to 12/08/2017  Site/dose:    1. The Right breast was treated to 40.05 Gy in 15 fractions of 2.67 Gy. 2. The Right breast was boosted to 10 Gy in 5  fractions of 2 Gy.  Beams/energy:    1. 3D// 10X 2. Isodose plan w/photons // 10X, 6X  Narrative: The patient tolerated radiation treatment relatively well.   She developed moderate  hyperpigmentation to the right breast with treatment. She denies any pain or fatigue. She continues to use radiaplex twice daily as directed.    12/2017 - 11/2018 Anti-estrogen oral therapy   started Tamoxifen 5m daily in 12/2017. Stopped in 11/2018 due to cancer recurrance. Restarted in early 12/2018 and switched to Letrozole on 01/14/19.    08/11/2018 Survivorship   SCP virtual visit per LCira Rue NP    10/27/2018 Imaging   CT CAP W Contrast 10/27/18  IMPRESSION: 1. Multifocal lytic bone metastases are identified involving the left ribs, L2 vertebra, and right iliac wing. There is of pathologic fracture involving the L2 vertebra. 2. Single small, nonspecific pulmonary nodule in the right upper lobe is noted. 3. Ill-defined low-density structure in right lobe of liver measures 1.3 cm and is indeterminate. This may be better characterized with contrast enhanced liver MRI or PET-CT.   10/28/2018 Imaging   Whole Body Bone Scan 10/28/18  IMPRESSION: Sites of uptake in the left third and sixth ribs, right iliac crest and L2 vertebral body are compatible with sites of osseous metastatic disease with lesions visualized on CT from 1 day prior   Non-specific soft tissue uptake is seen at the site of prior right lumpectomy.   Likely urinary contamination in the soft tissues of the right pelvis, buttock and right flank.   11/12/2018 Relapse/Recurrence   Diagnosis 1. Bone, biopsy, Lumbar 2, Left Pedicle - BONE WITH MARROW FIBROSIS. - NO MALIGNANCY IDENTIFIED. 2. Bone, biopsy, Lumbar 2, Right Pedicle - METASTATIC CARCINOMA, SEE COMMENT.  The tumor cells are NEGATIVE for Her2 (0). Estrogen Receptor: 0%, NEGATIVE Progesterone Receptor: 0%, NEGATIVE   12/02/2018 - 12/18/2018 Radiation Therapy   L2 palliative RT (12/02/18-12/09/18) and will start palliative RT to right iliac lesion 12/10/18 and completed on 12/18/18 with Dr. SIsidore Moos   12/03/2018 Pathology Results   Diagnosis 12/03/18 Bone, biopsy,  iliac crest, right - METASTATIC CARCINOMA. Results: IMMUNOHISTOCHEMICAL AND MORPHOMETRIC ANALYSIS PERFORMED MANUALLY The tumor cells are NEGATIVE for Her2 (1+). Estrogen Receptor: 80%, POSITIVE, STRONG STAINING INTENSITY Progesterone Receptor: 20%, POSITIVE, STRONG STAINING INTENSITY   12/16/2018 -  Chemotherapy   Zometa injection q380monthstarting 12/16/18. Due to flu like symptoms this was switched to monthly Xgeva with second dose on 03/17/19    12/28/2018 -  Chemotherapy   Ibrance 3 weeks on/1 week off starting 12/28/18 Letrozole once daily starting 01/14/19 with monthly Zoledex injections starting 12/16/18.    02/16/2019 Imaging   CT CAP W Contrast  IMPRESSION: Chest.   1. New peripheral consolidation in the LEFT upper lobe with air bronchograms adjacent to lytic lesions within the third and sixth rib is favored radiation change. Recommend attention on follow-up. 2. No new or progressive breast cancer in thorax.   Abdomen / Pelvis.   1. No evidence of metastatic disease in the soft tissues of the abdomen pelvis. 2. Increase in size of lytic metastasis in the RIGHT iliac CREST. 3. Interval L2 kyphoplasty at site a pathologic fracture.     02/16/2019 Imaging   Whole Body bone scan  IMPRESSION: 1. Stable to improved uptake associated with previous left anterior rib lesions. 2. Increased uptake within known lytic metastasis involving the L2 vertebra and anterior right iliac crest. 3. No new sites of disease.   04/29/2019 Imaging  MRI brain  IMPRESSION: 1.9 x 1.2 x 0.8 cm mass of the inferior fourth ventricle, extending at the foramen of Luschka on the right. Findings most worrisome for ependymoma. See above discussion.   These results will be called to the ordering clinician or representative by the Radiologist Assistant, and communication documented in the PACS or zVision Dashboard.   05/17/2019 Imaging   MRI brain  IMPRESSION: 1. Status post fourth ventricle lesion  resection. Small amount of residual contrast enhancement within the dorsal parenchyma at the level of the cervicomedullary junction, favored to be postsurgical. This will serve as a baseline for future studies. 2. Small focus of acute ischemia within the medial right cerebellar hemisphere. 3. Pneumocephalus.   05/17/2019 Surgery   Suboccipital craniotomy for tumor resection with brainlab by Dr. Zada Finders. Only 0.2cm of tumor was removed and biopsied. He was not able to fully resect it.     05/17/2019 Pathology Results   FINAL MICROSCOPIC DIAGNOSIS:   A. BRAIN, TUMOR, BIOPSY:  - Metastatic carcinoma, see comment.   COMMENT:   Immunohistochemistry is positive for cytokeratin 7 and GATA-3.  Cytokeratin 20 is negative. The findings are consistent with metastatic  breast carcinoma. Prognostic markers have been ordered.  ADDENDUM:   PROGNOSTIC INDICATOR RESULTS:  The tumor cells are EQUIVOCAL for Her2 (2+).   Estrogen Receptor: 95%, STRONG STAINING INTESITY  Progesterone Receptor: 20%, STRONG STAINING INTESITY  GROUP 5:   HER2 **NEGATIVE**    05/31/2019 Imaging   MRI brain  IMPRESSION: 1. Postsurgical changes from partial resection of a medullary lesion with persistent nodular contrast enhancement, not significantly changed from prior MRI. 2. Interval development of contrast enhancement in the anterior inferior aspect of the cerebellum, in the area of previously seen restricted diffusion, consistent with post ischemic changes, although concurrent tumor extension cannot be entirely excluded.   06/07/2019 Imaging   CT CAP W contrast  IMPRESSION: 1. Signs of pleural and parenchymal scarring in the left chest associated with radiotherapy with diminished consolidative change compared to the prior study. 2. Stable 4 mm perifissural nodule along the major fissure of the right chest. 3. Increasing sclerosis of left ribs as described may reflect response to therapy with healing. 4.  New lytic focus with sclerotic margin in the right iliac bone posteriorly measuring 1.8 cm. New small focus of sclerosis in the posterior left acetabulum measuring approximately 11 mm. These areas were not visible on the prior study, perhaps reflecting interval healing, related to ongoing therapy given findings of increased sclerosis and other areas though mixed response is considered. Close attention on follow-up is suggested. 5. Redemonstration of destructive process in the right iliac bone anteriorly along the right iliac CREST, greatest axial dimension approximately 4.1 cm as compared to 4.3 cm. May also reflect post treatment related improvement given further sclerosis, attention on follow-up. 6. New small focus of sclerosis along the lateral margin of the greater trochanter (image 93, no measurable lesion in this location). Attention on subsequent bone scan and follow-up evaluations. Is   06/07/2019 Imaging   Bone Scan whole body  IMPRESSION: 1. Stable areas of increased activity noted over the left anterior third and sixth ribs. Stable area of increased activity noted in L2.   2. Subtle new foci of increased activity noted over the lower thoracic spine. A new proximal right femoral focus of increased activity also noted. Findings suggest progressive metastatic disease.     06/07/2019 - 06/24/2019 Radiation Therapy   Brain Radiation by Dr. Isidore Moos  CURRENT THERAPY:  -Zometa infusionq48month starting 12/16/18, Due to flu like symptoms will switched to monthly Xgeva with second dosein 03/2019 -Monthly Zoladex injections starting9/2/20 -Restart tamoxifen in 1 week.Sheswitched toLetrozole with 2nd Zoladex injection10/1/20. -Ibrance 3 weeks on/1 week off starting9/14/20.Held week before-one week after brain surgery in 05/2019.  -Brain RT starting 06/07/19-06/24/19  INTERVAL HISTORY:  KDreyah Montroseis here for a follow up. She presents to the clinic alone. She  notes she is tolerating her Brain RT well. She denies any HA's so far. She notes she still has right hip pain which was exacerbated by brain surgery. Her back is rarely in pain unless he is standing for long periods of time. She notes she is currently on her 2nd week of current Ibrance cycle.     REVIEW OF SYSTEMS:   Constitutional: Denies fevers, chills or abnormal weight loss Eyes: Denies blurriness of vision Ears, nose, mouth, throat, and face: Denies mucositis or sore throat Respiratory: Denies cough, dyspnea or wheezes Cardiovascular: Denies palpitation, chest discomfort or lower extremity swelling Gastrointestinal:  Denies nausea, heartburn or change in bowel habits Skin: Denies abnormal skin rashes MSK: (+) increased right hip pain  Lymphatics: Denies new lymphadenopathy or easy bruising Neurological:Denies numbness, tingling or new weaknesses Behavioral/Psych: Mood is stable, no new changes  All other systems were reviewed with the patient and are negative.  MEDICAL HISTORY:  Past Medical History:  Diagnosis Date  . Anemia   . Anxiety   . [place]   breast cancer right  . Depression   . [place] mellitus without complication ([place]   . GERD (gastroesophageal reflux disease)   . Headache   . History of radiation therapy 11/10/17- 12/08/17   right breast, 40.05 Gy in 15 fractions. Right breast boost 10 Gy in 5 fractions.   . [place]ast[place]   pelvis, spine and ribs  . Personal history of chemotherapy   . Personal history of radiation therapy     SURGICAL HISTORY: Past Surgical History:  Procedure Laterality Date  . APPLICATION OF CRANIAL NAVIGATION N/A 05/17/2019   Procedure: APPLICATION OF CRANIAL NAVIGATION;  Surgeon: OJudith Part MD;  [facility]  Service: Neurosurgery;  Laterality: N/A;  . CERVICAL CONE BIOPSY    . CHOLECYSTECTOMY    . CRANIOTOMY N/A 05/17/2019   Procedure: Suboccipital craniotomy for tumor  resection with brainlab;  Surgeon: OJudith Part MD;  [facility]  Service: Neurosurgery;  Laterality: N/A;  posterior/suboccipital  . IR RADIOLOGIST EVAL & MGMT  11/03/2018  . KYPHOPLASTY N/A 11/12/2018   Procedure: LUMBAR TWO  BIOPSY AND LUMBAR TWO KYPHOPLASTY;  Surgeon: BMelina Schools MD;  [facility]  Service: Orthopedics;  Laterality: N/A;  90 mins  . MASTECTOMY WITH RADIOACTIVE SEED GUIDED EXCISION AND AXILLARY SENTINEL LYMPH NODE BIOPSY Right 07/08/2017   Procedure: RIGHT LUMPTECTOMY WITH RADIOACTIVE SEED GUIDED EXCISION AND RIGHT AXILLARY SENTINEL LYMPH NODE BIOPSY ERAS PATHWAY;  Surgeon: WRolm Bookbinder MD;  [facility]  Service: General;  Laterality: Right;  PEC BLOCK  . OPEN REDUCTION INTERNAL FIXATION (ORIF) DISTAL RADIAL FRACTURE  03/17/2012   Procedure: OPEN REDUCTION INTERNAL FIXATION (ORIF) DISTAL RADIAL FRACTURE;  Surgeon: WRoseanne Kaufman MD;  [facility]  Service: Orthopedics;  Laterality: Right;  Pre-operative a supra-clavical block right arm in addition to general anesthesia  . PORT-A-CATH REMOVAL Right 02/02/2018   Procedure: REMOVAL PORT-A-CATH;  Surgeon: WRolm Bookbinder MD;  [facility]  Service: General;  Laterality: Right;  . PORTACATH PLACEMENT N/A 07/30/2017   Procedure: INSERTION PORT-A-CATH WITH [place];  Surgeon: [name], MD;  Location: [facility];  Service: General;  Laterality: N/A;  . TUBAL LIGATION      I have reviewed the social history and family history with the patient and they are unchanged from previous note.  ALLERGIES:  has No Known Allergies.  MEDICATIONS:  Current Outpatient Medications  Medication Sig Dispense Refill  . blood glucose meter kit and supplies KIT Dispense based on patient and insurance preference. Use up to four times daily as directed. (FOR ICD-9 250.00, 250.01). 1 each 0  . gabapentin (NEURONTIN) 300 MG capsule Take 1 capsule morning and afternoon and 2 capsules at bedtime  (Patient taking differently: Take 300-600 mg by mouth See admin instructions. Take 300 mg by mouth in the morning and afternoon and 600 mg at bedtime) 270 capsule 3  . letrozole (FEMARA) 2.5 MG tablet TAKE 1 TABLET BY MOUTH EVERY DAY (Patient taking differently: Take 2.5 mg by mouth daily. ) 90 tablet 1  . MELATONIN ER PO Take 15 mg by mouth at bedtime as needed (sleep).     . metFORMIN (GLUCOPHAGE) 500 MG tablet TAKE 1 TABLET (500 MG TOTAL) BY MOUTH 2 (TWO) TIMES DAILY WITH A MEAL. 180 tablet 0  . morphine (MS CONTIN) 15 MG 12 hr tablet Take 1 tablet (15 mg total) by mouth at bedtime. 30 tablet 0  . Multiple Vitamin (MULTIVITAMIN WITH MINERALS) TABS tablet Take 1 tablet by mouth daily.    . ondansetron (ZOFRAN) 8 MG tablet TAKE 1 TABLET (8 MG TOTAL) BY MOUTH EVERY 8 (EIGHT) HOURS AS NEEDED FOR NAUSEA OR VOMITING. 30 tablet 3  . oxyCODONE (OXY IR/ROXICODONE) 5 MG immediate release tablet Take 1 tablet (5 mg total) by mouth every 8 (eight) hours as needed for severe pain. 45 tablet 0  . palbociclib (IBRANCE) 125 MG tablet TAKE 1 TABLET (125 MG TOTAL) BY MOUTH DAILY. TAKE FOR 21 DAYS ON, 7 DAYS OFF, REPEAT EVERY 28 DAYS. 21 tablet 0  . polyethylene glycol (MIRALAX) 17 g packet Take 17 g by mouth daily as needed. (Patient taking differently: Take 17 g by mouth daily as needed for mild constipation. ) 14 each 8  . potassium chloride SA (KLOR-CON M20) 20 MEQ tablet Take 1 tablet (20 mEq total) by mouth daily. 90 tablet 1  . prochlorperazine (COMPAZINE) 10 MG tablet Take 1 tablet (10 mg total) by mouth every 6 (six) hours as needed for nausea or vomiting. 30 tablet 2   No current facility-administered medications for this visit.    PHYSICAL EXAMINATION: ECOG PERFORMANCE STATUS: 2 - Symptomatic, <50% confined to bed  Vitals:   06/10/19 1029  BP: 122/85  Pulse: 94  Resp: 20  Temp: (!) 97.3 F (36.3 C)  SpO2: 97%   Filed Weights   06/10/19 1029  Weight: 272 lb (123.4 kg)    GENERAL:alert, no  distress and comfortable SKIN: skin color, texture, turgor are normal, no rashes or significant lesions (+) Posterior scalp incision healed well  EYES: normal, Conjunctiva are pink and non-injected, sclera clear  NECK: supple, thyroid normal size, non-tender, without nodularity LYMPH:  no palpable lymphadenopathy in the cervical, axillary  LUNGS: clear to auscultation and percussion with normal breathing effort HEART: regular rate & rhythm and no murmurs and no lower extremity edema ABDOMEN:abdomen soft, non-tender and normal bowel sounds Musculoskeletal:no cyanosis of digits and no clubbing  NEURO: alert &  oriented x 3 with fluent speech, no focal motor/sensory deficits BREAST: S/p right lumpectomy, healed   LABORATORY DATA:  I have reviewed the data as listed CBC Latest Ref Rng & Units 06/07/2019 05/17/2019 05/13/2019  WBC 4.0 - 10.5 K/uL 6.6 4.2 3.1(L)  Hemoglobin 12.0 - 15.0 g/dL 12.4 12.0 12.1  Hematocrit 36.0 - 46.0 % 37.9 36.5 36.9  Platelets 150 - 400 K/uL 307 233 194     CMP Latest Ref Rng & Units 06/07/2019 05/17/2019 05/13/2019  Glucose 70 - 99 mg/dL 109(H) - 115(H)  BUN 6 - 20 mg/dL 15 - 8  Creatinine 0.44 - 1.00 mg/dL 0.82 0.78 0.75  Sodium 135 - 145 mmol/L 140 - 140  Potassium 3.5 - 5.1 mmol/L 3.9 - 3.5  Chloride 98 - 111 mmol/L 103 - 106  CO2 22 - 32 mmol/L 26 - 26  Calcium 8.9 - 10.3 mg/dL 9.0 - 8.2(L)  Total Protein 6.5 - 8.1 g/dL 8.0 - -  Total Bilirubin 0.3 - 1.2 mg/dL 0.3 - -  Alkaline Phos 38 - 126 U/L 65 - -  AST 15 - 41 U/L 17 - -  ALT 0 - 44 U/L 18 - -      RADIOGRAPHIC STUDIES: I have personally reviewed the radiological images as listed and agreed with the findings in the report. No results found.   ASSESSMENT & PLAN:  Caitlin Cohen is a 54 y.o. female with   1. 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 She is s/p right breastlumpectomy, adjuvantchemoTC and adjuvant radiationand was treated with Tamoxifen since 12/2017. Due to disease metastatic recurrence, she stoppedon 01/14/19. -In 10/2018 she was diagnosedwith cancer recurrence now metastatic to the bone confirmed on 11/2018 Second bone biopsy.  -She underwentpalliativeradiation to right iliac and L2 and Kyphoplasty for her cancer related bone pain. -I started systemic treatment with first line Letrozole starting 01/14/19 with monthly Zoladex injection andoral CD4K inhibitor Ibrance 3 weeks on/1 week offstarting 12/28/18. -She plans to have BSO in 2021 so she can stop Zoladex injections.  -She went in for brain surgery on 05/17/19. Biopsy of the tumor unfortunately showed brain metastasis from her breast cancer, ER+/PR+/HER2-.  -She is currently undergoing Brain RT with Dr. Isidore Moos since 06/07/19. Plan to complete 06/24/19.  -I will do PD-L1 and FO on tumor sample to see if she has other targeted treatment options.  -I personally reviewed and discussed her CT CAP and bone scan from 06/07/19 which shows mixed response with new lesion of pelvic region and worsening bone metastasis in right femur. Overall impression is disease progression. Clinically she also developed more pain in right hip area  -Due to her short response to AI Ibrance, her tumor could have heterogenous ER+ expression. I recommend [place] PET scan that recognized estrogen receptors for further evaluation. She is agreeable.  -if she has ER- disease on the [place] PET, I would consider switching her to chemo  -She will continue Ibrance for current (plan to complete on 3/7) and one more cycle before we discuss changing her treatment. Recent labs reviewed, physical exam stable and adequate to continue. Proceed with Zoladex and Xgeva injection today.  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 -She underwent L2 Kyphoplastyand biopsy by Dr. Georgiann Mohs 11/12/18. -Her8/20/20 bone biopsy without decalcification showed her metastasis to be ER/PE positive and her2 negative.  -Shecompleted L2 palliative RT (12/02/18-12/09/18) and palliative RIto right iliac lesion (12/10/18-12/18/18)with Dr. Isidore Moos. -She started Zometa q1050month on 12/16/18.She had flu like symptoms, likely related with her.Was switched to monthly Xgevain 03/2019. -Due to recent right facial itching and numbness she had brain scan for further eval. Her 04/29/19 Brain MRI showed a 1.9cm mass at the 4th ventricle. She has had progression of right sided numbness mainly in her face.  -She went in for brain surgery on 05/17/19 but was not able to completely resect. Biopsy was done instead and showed brain metastasis from her breast cancer.  -She is currently undergoing Brain RT with Dr. SIsidore Moossince 06/07/19. Plan to complete 06/24/19, tolerating well.  -Her CT CAP and bone scan from 06/07/19 shows mixed response with new lesion of pelvic region and right femur. She does have increase right hip pain which is related.  -Her back pain is rare now and her hip pain has increased to 5/10. She is currently on MS Contin 168mto 1 tab nightly with oxycodone 50m22mID. She also has Gabapentin300m19md she will continue Calcium.Pain overall controlled.    3. N&V, Mild Constipation -Her nausea is well controlled with dailyZofran and Compazine, continue. -Shehas beenable to eat and drink enough to maintain her weight. -Her constipation is likely related to her pain medication use. Continue Miralax use, at least TID -Well controlled with  antiemetics.   4. Depression/Anxiety  -She has a hx of depression and was previously treated with medication. Not currently on medication and she feels that her depression is controlled. -Mood is mostly stable on Letrozole and Zoladex.  5. Mild neutropenia -secondary to Ibrance, mild, will continue monitoring. Stable   6.Goal of care discussion  -The patient understands the goal of care is palliative. -she is full code now  7. DM -She is currently on Metformin 500mg66m, diagnosed after initial chemotherapy. She is still looking for PCP to manage this.   8. Hypokalemia -On 20meQ22massium, continue daily.   Plan -CT and bone scans and brian tumor biopsy path reviewed.  -[place] PET in 1-2 weeks to determine if she needs chemo  -will test PD-L1 and FO on her brain tumor biopsy  -Proceed with Zoladex and Xgeva injection today  -Continue Zoladex monthly, Xgeva monthly, Letrozole daily, Ibrance 3 weeks on/1 week off. Next cycle starts on 3/8 -f/u in 2 weeks  -will refill oxycodone around 3/8, she is taking about 3 tabs a day   No problem-specific Assessment & Plan notes found for this encounter.   Orders Placed This Encounter  Procedures  . NM PET ([facility]) WHOLE BODY    Mixed response to antiestrogen therapy, evaluate heterogeneity of breast cancer    Standing Status:   Future    Standing Expiration Date:   08/07/2020    Order Specific Question:   If indicated for the ordered procedure, I authorize the administration of a radiopharmaceutical per Radiology protocol    Answer:   Yes    Order Specific Question:   Is the patient pregnant?    Answer:   No    Order Specific Question:   Preferred imaging location?  Answer:   [facility]    Order Specific Question:   Radiology Contrast Protocol - do NOT remove file path    Answer:   \\[email or web address]   All questions were answered. The patient knows to call the clinic with any problems,  questions or concerns. No barriers to learning was detected. The total time spent in the appointment was 40 minutes.     Truitt Merle, MD 06/10/2019   I, Joslyn Devon, am acting as scribe for Truitt Merle, MD.   I have reviewed the above documentation for accuracy and completeness, and I agree with the above.

## 2019-06-08 ENCOUNTER — Ambulatory Visit
Admission: RE | Admit: 2019-06-08 | Discharge: 2019-06-08 | Disposition: A | Discharge status: Home / Self Care | Payer: Medicaid Other | Source: Ambulatory Visit | Attending: Radiation Oncology | Admitting: Radiation Oncology

## 2019-06-08 ENCOUNTER — Ambulatory Visit: Payer: Medicaid Other

## 2019-06-08 ENCOUNTER — Other Ambulatory Visit: Payer: Self-pay

## 2019-06-08 DIAGNOSIS — Z510 Encounter for antineoplastic radiation therapy: Secondary | ICD-10-CM | POA: Diagnosis not present

## 2019-06-08 LAB — CANCER ANTIGEN 27.29: CA 27.29: 58.8 U/mL — ABNORMAL HIGH (ref 0.0–38.6)

## 2019-06-09 ENCOUNTER — Other Ambulatory Visit: Payer: Self-pay

## 2019-06-09 ENCOUNTER — Ambulatory Visit: Admission: RE | Admit: 2019-06-09 | Payer: Medicaid Other | Source: Ambulatory Visit

## 2019-06-09 ENCOUNTER — Ambulatory Visit: Payer: Medicaid Other

## 2019-06-09 DIAGNOSIS — Z510 Encounter for antineoplastic radiation therapy: Secondary | ICD-10-CM | POA: Diagnosis not present

## 2019-06-10 ENCOUNTER — Inpatient Hospital Stay: Payer: Medicaid Other

## 2019-06-10 ENCOUNTER — Ambulatory Visit: Payer: Medicaid Other

## 2019-06-10 ENCOUNTER — Encounter: Payer: Self-pay | Admitting: Hematology

## 2019-06-10 ENCOUNTER — Inpatient Hospital Stay (HOSPITAL_BASED_OUTPATIENT_CLINIC_OR_DEPARTMENT_OTHER): Payer: Medicaid Other | Admitting: Hematology

## 2019-06-10 ENCOUNTER — Other Ambulatory Visit: Payer: Self-pay

## 2019-06-10 ENCOUNTER — Ambulatory Visit
Admission: RE | Admit: 2019-06-10 | Discharge: 2019-06-10 | Disposition: A | Discharge status: Home / Self Care | Payer: Medicaid Other | Source: Ambulatory Visit | Attending: Radiation Oncology | Admitting: Radiation Oncology

## 2019-06-10 VITALS — BP 122/85 | HR 94 | Temp 97.3°F | Resp 20 | Ht 67.0 in | Wt 272.0 lb

## 2019-06-10 DIAGNOSIS — E119 Type 2 diabetes mellitus without complications: Secondary | ICD-10-CM | POA: Diagnosis not present

## 2019-06-10 DIAGNOSIS — Z5111 Encounter for antineoplastic chemotherapy: Secondary | ICD-10-CM | POA: Diagnosis not present

## 2019-06-10 DIAGNOSIS — Z510 Encounter for antineoplastic radiation therapy: Secondary | ICD-10-CM | POA: Diagnosis not present

## 2019-06-10 DIAGNOSIS — C50211 Malignant neoplasm of upper-inner quadrant of right female breast: Secondary | ICD-10-CM

## 2019-06-10 DIAGNOSIS — C7951 Secondary malignant neoplasm of bone: Secondary | ICD-10-CM | POA: Diagnosis not present

## 2019-06-10 DIAGNOSIS — Z95828 Presence of other vascular implants and grafts: Secondary | ICD-10-CM

## 2019-06-10 DIAGNOSIS — Z170 Estrogen receptor positive status [ER+]: Secondary | ICD-10-CM

## 2019-06-10 MED ORDER — PALBOCICLIB 125 MG PO TABS: ORAL_TABLET | ORAL | 0 refills | Status: DC

## 2019-06-10 MED ORDER — DENOSUMAB 120 MG/1.7ML ~~LOC~~ SOLN
120.0000 mg | Freq: Once | SUBCUTANEOUS | Status: AC
  Administered 2019-06-10: 120 mg via SUBCUTANEOUS

## 2019-06-10 MED ORDER — GOSERELIN ACETATE 3.6 MG ~~LOC~~ IMPL
DRUG_IMPLANT | SUBCUTANEOUS | Status: AC
  Filled 2019-06-10: qty 3.6

## 2019-06-10 MED ORDER — GOSERELIN ACETATE 3.6 MG ~~LOC~~ IMPL
3.6000 mg | DRUG_IMPLANT | Freq: Once | SUBCUTANEOUS | Status: AC
  Administered 2019-06-10: 3.6 mg via SUBCUTANEOUS

## 2019-06-10 MED ORDER — DENOSUMAB 120 MG/1.7ML ~~LOC~~ SOLN
SUBCUTANEOUS | Status: AC
  Filled 2019-06-10: qty 1.7

## 2019-06-10 NOTE — Patient Instructions (Signed)
Denosumab injection What is this medicine? DENOSUMAB (den oh sue mab) slows bone breakdown. Prolia is used to treat osteoporosis in women after menopause and in men, and in people who are taking corticosteroids for 6 months or more. Xgeva is used to treat a high calcium level due to cancer and to prevent bone fractures and other bone problems caused by multiple myeloma or cancer bone metastases. Xgeva is also used to treat giant cell tumor of the bone. This medicine may be used for other purposes; ask your health care provider or pharmacist if you have questions. COMMON BRAND NAME(S): Prolia, XGEVA What should I tell my health care provider before I take this medicine? They need to know if you have any of these conditions:  dental disease  having surgery or tooth extraction  infection  kidney disease  low levels of calcium or Vitamin D in the blood  malnutrition  on hemodialysis  skin conditions or sensitivity  thyroid or parathyroid disease  an unusual reaction to denosumab, other medicines, foods, dyes, or preservatives  pregnant or trying to get pregnant  breast-feeding How should I use this medicine? This medicine is for injection under the skin. It is given by a health care professional in a hospital or clinic setting. A special MedGuide will be given to you before each treatment. Be sure to read this information carefully each time. For Prolia, talk to your pediatrician regarding the use of this medicine in children. Special care may be needed. For Xgeva, talk to your pediatrician regarding the use of this medicine in children. While this drug may be prescribed for children as young as 13 years for selected conditions, precautions do apply. Overdosage: If you think you have taken too much of this medicine contact a poison control center or emergency room at once. NOTE: This medicine is only for you. Do not share this medicine with others. What if I miss a dose? It is  important not to miss your dose. Call your doctor or health care professional if you are unable to keep an appointment. What may interact with this medicine? Do not take this medicine with any of the following medications:  other medicines containing denosumab This medicine may also interact with the following medications:  medicines that lower your chance of fighting infection  steroid medicines like prednisone or cortisone This list may not describe all possible interactions. Give your health care provider a list of all the medicines, herbs, non-prescription drugs, or dietary supplements you use. Also tell them if you smoke, drink alcohol, or use illegal drugs. Some items may interact with your medicine. What should I watch for while using this medicine? Visit your doctor or health care professional for regular checks on your progress. Your doctor or health care professional may order blood tests and other tests to see how you are doing. Call your doctor or health care professional for advice if you get a fever, chills or sore throat, or other symptoms of a cold or flu. Do not treat yourself. This drug may decrease your body's ability to fight infection. Try to avoid being around people who are sick. You should make sure you get enough calcium and vitamin D while you are taking this medicine, unless your doctor tells you not to. Discuss the foods you eat and the vitamins you take with your health care professional. See your dentist regularly. Brush and floss your teeth as directed. Before you have any dental work done, tell your dentist you are   receiving this medicine. Do not become pregnant while taking this medicine or for 5 months after stopping it. Talk with your doctor or health care professional about your birth control options while taking this medicine. Women should inform their doctor if they wish to become pregnant or think they might be pregnant. There is a potential for serious side  effects to an unborn child. Talk to your health care professional or pharmacist for more information. What side effects may I notice from receiving this medicine? Side effects that you should report to your doctor or health care professional as soon as possible:  allergic reactions like skin rash, itching or hives, swelling of the face, lips, or tongue  bone pain  breathing problems  dizziness  jaw pain, especially after dental work  redness, blistering, peeling of the skin  signs and symptoms of infection like fever or chills; cough; sore throat; pain or trouble passing urine  signs of low calcium like fast heartbeat, muscle cramps or muscle pain; pain, tingling, numbness in the hands or feet; seizures  unusual bleeding or bruising  unusually weak or tired Side effects that usually do not require medical attention (report to your doctor or health care professional if they continue or are bothersome):  constipation  diarrhea  headache  joint pain  loss of appetite  muscle pain  runny nose  tiredness  upset stomach This list may not describe all possible side effects. Call your doctor for medical advice about side effects. You may report side effects to FDA at 1-800-FDA-1088. Where should I keep my medicine? This medicine is only given in a clinic, doctor's office, or other health care setting and will not be stored at home. NOTE: This sheet is a summary. It may not cover all possible information. If you have questions about this medicine, talk to your doctor, pharmacist, or health care provider.  2020 Elsevier/Gold Standard (2017-08-08 16:10:44) Goserelin injection What is this medicine? GOSERELIN (GOE se rel in) is similar to a hormone found in the body. It lowers the amount of sex hormones that the body makes. Men will have lower testosterone levels and women will have lower estrogen levels while taking this medicine. In men, this medicine is used to treat prostate  cancer; the injection is either given once per month or once every 12 weeks. A once per month injection (only) is used to treat women with endometriosis, dysfunctional uterine bleeding, or advanced breast cancer. This medicine may be used for other purposes; ask your health care provider or pharmacist if you have questions. COMMON BRAND NAME(S): Zoladex What should I tell my health care provider before I take this medicine? They need to know if you have any of these conditions:  bone problems  diabetes  heart disease  history of irregular heartbeat  an unusual or allergic reaction to goserelin, other medicines, foods, dyes, or preservatives  pregnant or trying to get pregnant  breast-feeding How should I use this medicine? This medicine is for injection under the skin. It is given by a health care professional in a hospital or clinic setting. Talk to your pediatrician regarding the use of this medicine in children. Special care may be needed. Overdosage: If you think you have taken too much of this medicine contact a poison control center or emergency room at once. NOTE: This medicine is only for you. Do not share this medicine with others. What if I miss a dose? It is important not to miss your dose. Call your   doctor or health care professional if you are unable to keep an appointment. What may interact with this medicine? Do not take this medicine with any of the following medications:  cisapride  dronedarone  pimozide  thioridazine This medicine may also interact with the following medications:  other medicines that prolong the QT interval (an abnormal heart rhythm) This list may not describe all possible interactions. Give your health care provider a list of all the medicines, herbs, non-prescription drugs, or dietary supplements you use. Also tell them if you smoke, drink alcohol, or use illegal drugs. Some items may interact with your medicine. What should I watch for  while using this medicine? Visit your doctor or health care provider for regular checks on your progress. Your symptoms may appear to get worse during the first weeks of this therapy. Tell your doctor or healthcare provider if your symptoms do not start to get better or if they get worse after this time. Your bones may get weaker if you take this medicine for a long time. If you smoke or frequently drink alcohol you may increase your risk of bone loss. A family history of osteoporosis, chronic use of drugs for seizures (convulsions), or corticosteroids can also increase your risk of bone loss. Talk to your doctor about how to keep your bones strong. This medicine should stop regular monthly menstruation in women. Tell your doctor if you continue to menstruate. Women should not become pregnant while taking this medicine or for 12 weeks after stopping this medicine. Women should inform their doctor if they wish to become pregnant or think they might be pregnant. There is a potential for serious side effects to an unborn child. Talk to your health care professional or pharmacist for more information. Do not breast-feed an infant while taking this medicine. Men should inform their doctors if they wish to father a child. This medicine may lower sperm counts. Talk to your health care professional or pharmacist for more information. This medicine may increase blood sugar. Ask your healthcare provider if changes in diet or medicines are needed if you have diabetes. What side effects may I notice from receiving this medicine? Side effects that you should report to your doctor or health care professional as soon as possible:  allergic reactions like skin rash, itching or hives, swelling of the face, lips, or tongue  bone pain  breathing problems  changes in vision  chest pain  feeling faint or lightheaded, falls  fever, chills  pain, swelling, warmth in the leg  pain, tingling, numbness in the hands  or feet  signs and symptoms of high blood sugar such as being more thirsty or hungry or having to urinate more than normal. You may also feel very tired or have blurry vision  signs and symptoms of low blood pressure like dizziness; feeling faint or lightheaded, falls; unusually weak or tired  stomach pain  swelling of the ankles, feet, hands  trouble passing urine or change in the amount of urine  unusually high or low blood pressure  unusually weak or tired Side effects that usually do not require medical attention (report to your doctor or health care professional if they continue or are bothersome):  change in sex drive or performance  changes in breast size in both males and females  changes in emotions or moods  headache  hot flashes  irritation at site where injected  loss of appetite  skin problems like acne, dry skin  vaginal dryness This list  may not describe all possible side effects. Call your doctor for medical advice about side effects. You may report side effects to FDA at 1-800-FDA-1088. Where should I keep my medicine? This drug is given in a hospital or clinic and will not be stored at home. NOTE: This sheet is a summary. It may not cover all possible information. If you have questions about this medicine, talk to your doctor, pharmacist, or health care provider.  2020 Elsevier/Gold Standard (2018-07-20 14:05:56)  

## 2019-06-11 ENCOUNTER — Ambulatory Visit
Admission: RE | Admit: 2019-06-11 | Discharge: 2019-06-11 | Disposition: A | Discharge status: Home / Self Care | Payer: Medicaid Other | Source: Ambulatory Visit | Attending: Radiation Oncology | Admitting: Radiation Oncology

## 2019-06-11 ENCOUNTER — Ambulatory Visit: Payer: Medicaid Other

## 2019-06-11 ENCOUNTER — Other Ambulatory Visit: Payer: Self-pay

## 2019-06-11 ENCOUNTER — Telehealth: Payer: Self-pay | Admitting: Hematology

## 2019-06-11 DIAGNOSIS — Z510 Encounter for antineoplastic radiation therapy: Secondary | ICD-10-CM | POA: Diagnosis not present

## 2019-06-11 NOTE — Telephone Encounter (Signed)
Scheduled appt per 2/25 los.  Spoke with pt and she is aware of her appt date and time.

## 2019-06-14 ENCOUNTER — Other Ambulatory Visit: Payer: Self-pay

## 2019-06-14 ENCOUNTER — Ambulatory Visit: Payer: Medicaid Other

## 2019-06-14 ENCOUNTER — Ambulatory Visit
Admission: RE | Admit: 2019-06-14 | Discharge: 2019-06-14 | Disposition: A | Discharge status: Home / Self Care | Payer: Medicaid Other | Source: Ambulatory Visit | Attending: Radiation Oncology | Admitting: Radiation Oncology

## 2019-06-14 DIAGNOSIS — C7931 Secondary malignant neoplasm of brain: Secondary | ICD-10-CM | POA: Insufficient documentation

## 2019-06-14 DIAGNOSIS — Z510 Encounter for antineoplastic radiation therapy: Secondary | ICD-10-CM | POA: Diagnosis not present

## 2019-06-14 DIAGNOSIS — C50211 Malignant neoplasm of upper-inner quadrant of right female breast: Secondary | ICD-10-CM | POA: Insufficient documentation

## 2019-06-15 ENCOUNTER — Other Ambulatory Visit: Payer: Self-pay

## 2019-06-15 ENCOUNTER — Ambulatory Visit: Payer: Medicaid Other

## 2019-06-15 ENCOUNTER — Ambulatory Visit
Admission: RE | Admit: 2019-06-15 | Discharge: 2019-06-15 | Disposition: A | Discharge status: Home / Self Care | Payer: Medicaid Other | Source: Ambulatory Visit | Attending: Radiation Oncology | Admitting: Radiation Oncology

## 2019-06-15 DIAGNOSIS — Z510 Encounter for antineoplastic radiation therapy: Secondary | ICD-10-CM | POA: Diagnosis not present

## 2019-06-16 ENCOUNTER — Other Ambulatory Visit: Payer: Self-pay

## 2019-06-16 ENCOUNTER — Ambulatory Visit
Admission: RE | Admit: 2019-06-16 | Discharge: 2019-06-16 | Disposition: A | Discharge status: Home / Self Care | Payer: Medicaid Other | Source: Ambulatory Visit | Attending: Radiation Oncology | Admitting: Radiation Oncology

## 2019-06-16 ENCOUNTER — Ambulatory Visit: Payer: Medicaid Other

## 2019-06-16 DIAGNOSIS — Z510 Encounter for antineoplastic radiation therapy: Secondary | ICD-10-CM | POA: Diagnosis not present

## 2019-06-17 ENCOUNTER — Ambulatory Visit: Payer: Medicaid Other

## 2019-06-17 ENCOUNTER — Telehealth: Payer: Self-pay

## 2019-06-17 ENCOUNTER — Other Ambulatory Visit: Payer: Self-pay

## 2019-06-17 ENCOUNTER — Ambulatory Visit
Admission: RE | Admit: 2019-06-17 | Discharge: 2019-06-17 | Disposition: A | Discharge status: Home / Self Care | Payer: Medicaid Other | Source: Ambulatory Visit | Attending: Radiation Oncology | Admitting: Radiation Oncology

## 2019-06-17 DIAGNOSIS — Z510 Encounter for antineoplastic radiation therapy: Secondary | ICD-10-CM | POA: Diagnosis not present

## 2019-06-17 NOTE — Telephone Encounter (Signed)
I spoke with Caitlin Cohen and let her know her insurance will not authorize a PET scan. She verbalized understanding.

## 2019-06-17 NOTE — Telephone Encounter (Signed)
----- Message from Truitt Merle, MD sent at 06/17/2019  8:49 AM EST ----- Regarding: RE: PET Denied Contact: [phone] OK, thanks  Santiago Glad, let pt know the insurance denial of her PET scan  Lacie, she is seeing you on Monday, will continue current treatment  Thanks  Krista Blue  ----- Message ----- From: Gaspar Bidding Sent: 06/17/2019   8:28 AM EST To: April H Pait, Amber Suezanne Cheshire, Valinda Hoar, # Subject: RE: PET Denied                                 Hello Dr. Burr Medico,  I spoke with Pamala Hurry [phone], manager  with [facility] hotline. Per Pamala Hurry at this point there is no other options as Medicaid of [place] will not cover.  Darlena ----- Message ----- From: Truitt Merle, MD Sent: 06/16/2019   5:06 PM EST To: April H Pait, Amber Suezanne Cheshire, Gaspar Bidding, # Subject: RE: PET Denied                                 Unfortunately her insurance companied can not approve it, she suggest me to appeal with her Medicaid care plan. She did not tell me how to do it.   Darlena, could you call [facility] hotline at [phone], to see if they can help with the appeal process.  Thanks  Krista Blue  ----- Message ----- From: Gaspar Bidding Sent: 06/16/2019   2:33 PM EST To: April H Pait, Amber Suezanne Cheshire, Gaspar Bidding, # Subject: RE: PET Denied                                 Hello,  Dr. Burr Medico has a peer to peer scheduled with the payer today at [facility]. An appeal is not required at this time.  Darlena ----- Message ----- From: Theodosia Quay Sent: 06/16/2019   2:25 PM EST To: April H Pait, Amber Suezanne Cheshire, Gaspar Bidding, # Subject: RE: PET Denied                                   Hi all,  I sent over the denial to our [place] contact person and this was her response to the denial. Please feel free to reach out to me with any questions.   Thank you   "Hello Lovena Le,  Thank you for reaching out.  The best next step would be to have Mellen reach out to the [place] hotline at [phone].  They will assist in  appealing the denied request for approval.     Thank you,  Hinton Dyer" ----- Message ----- From: Augustine Radar Sent: 06/16/2019  10:28 AM EST To: Marlis Edelson Subject: FW: PET Denied                                  ----- Message ----- From: Gaspar Bidding Sent: 06/16/2019  10:12 AM EST To: April H Pait, Gaspar Bidding, # Subject: PET Denied  Hello Dr. Burr Medico,   Medicaid denied PET due to: We cannot approve this request. Your records show that you have cancer in your breast(s). The reason this request cannot be approved is because: 1. PET imaging using isotopes other than 75F-FDG is considered investigational at this time and, therefore, the request is not indicated at this time.   I spoke with the nurse and she was unable to approve case on nurse level.  Phone: [phone] Service Order: [id number] Date of Birth: [birth year] Provider listed on case:  [name]    NPI: [id number]    Please let me know how you want to proceed.  Darlena ----- Message ----- From: Gaspar Bidding Sent: 06/11/2019   9:20 AM EST To: Gaspar Bidding, Truitt Merle, MD, [facility]  Case pending review. Service Order: [id number]. Pending review ----- Message ----- From: Truitt Merle, MD Sent: 06/10/2019   5:56 PM EST To: Gaspar Bidding, [facility]  Darlena,   Please get her [facility] PET scan preauth, plan to do later next week if possible.   Thanks  Krista Blue

## 2019-06-18 ENCOUNTER — Ambulatory Visit: Payer: Medicaid Other

## 2019-06-18 ENCOUNTER — Other Ambulatory Visit: Payer: Self-pay

## 2019-06-18 ENCOUNTER — Ambulatory Visit
Admission: RE | Admit: 2019-06-18 | Discharge: 2019-06-18 | Disposition: A | Discharge status: Home / Self Care | Payer: Medicaid Other | Source: Ambulatory Visit | Attending: Radiation Oncology | Admitting: Radiation Oncology

## 2019-06-18 DIAGNOSIS — Z510 Encounter for antineoplastic radiation therapy: Secondary | ICD-10-CM | POA: Diagnosis not present

## 2019-06-20 NOTE — Progress Notes (Addendum)
[facility]   [phone] [phone]   Clinic Follow up Note   Patient Care Team: Patient, No Pcp Per as PCP - General ([place]) Truitt Merle, MD as Consulting Physician (Hematology) [name], MD as Consulting Physician (General Surgery) [name], MD as Attending Physician (Radiation Oncology) [name], NP as Nurse Practitioner (Nurse Practitioner) Date of Service: 06/21/2019   CHIEF COMPLAINT: F/u metastatic breast cancer   SUMMARY OF ONCOLOGIC HISTORY: Oncology History Overview Note  Cancer Staging Malignant neoplasm of upper-inner quadrant of right breast in female, estrogen receptor positive ([place]) Staging form: Breast, AJCC 8th Edition - Clinical stage from 06/26/2017: Stage IIA (cT2, cN0, cM0, G3, ER: Positive, PR: Positive, HER2: Negative) - Signed by Truitt Merle, MD on 07/01/2017 - Pathologic stage from 07/08/2017: Stage IB (pT2, pN0, cM0, G3, ER+, PR+, HER2-, Oncotype DX score: 29) - Signed by [name], NP on 07/25/2017     Malignant neoplasm of upper-inner quadrant of right breast in female, estrogen receptor positive ([place])  06/23/2017 Mammogram   IMPRESSION: 1. There is a highly suspicious mass measuring 3.5 cm mammographically in the right breast at the palpable site identified by the patient. 2. There is 1 suspicious lymph node with cortex measuring up to 6 mm. 3.  No mammographic evidence of malignancy in the left breast.   06/26/2017 Initial Biopsy   Diagnosis 06/26/17 1. Breast, right, needle core biopsy, upper inner quadrant, 12:30 o'clock, 7cm from nipple - INVASIVE DUCTAL CARCINOMA - SEE COMMENT 2. Lymph node, needle/core biopsy, right axillary (level 2 node) - NO CARCINOMA IDENTIFIED IN ONE LYMPH NODE (0/1)   06/26/2017 Receptors her2   Prognostic indicators significant for: ER, 70% positive and PR, 70% positive, both with strong staining intensity. Proliferation marker Ki67 at 60%. HER2  negative.   07/01/2017 Initial Diagnosis   Malignant neoplasm of upper-inner quadrant of right breast in female, estrogen receptor positive ([place])   07/08/2017 Pathology Results   Diagnosis 1. Breast, lumpectomy, Right - INVASIVE DUCTAL CARCINOMA, NOTTINGHAM GRADE 3 OF 3, 3.5 CM - MARGINS UNINVOLVED BY CARCINOMA (0.1 CM, SUPERIOR MARGIN) - PREVIOUS BIOPSY SITE CHANGES - SEE ONCOLOGY TABLE BELOW 2. Soft tissue, biopsy, Axillary - BENIGN FIBROADIPOSE TISSUE - NO MALIGNANCY IDENTIFIED 3. Lymph node, sentinel, biopsy, Right axillary - NO CARCINOMA IDENTIFIED IN ONE LYMPH NODE (0/1) 4. Lymph node, sentinel, biopsy, Right axillary - NO CARCINOMA IDENTIFIED IN ONE LYMPH NODE (0/1) - PREVIOUS BIOPSY SITE CHANGES - SEE COMMENT 5. Breast, excision, Right superior margin - BENIGN BREAST TISSUE - NO RESIDUAL CARCINOMA IDENTIFIED 6. Breast, excision, Right inferior margin - BENIGN BREAST TISSUE - NO RESIDUAL CARCINOMA IDENTIFIED   07/08/2017 Cancer Staging   Staging form: Breast, AJCC 8th Edition - Pathologic stage from 07/08/2017: Stage IB (pT2, pN0, cM0, G3, ER+, PR+, HER2-, Oncotype DX score: 29) - Signed by [name], NP on 07/25/2017   07/22/2017 Oncotype testing   Recurrence Score: 29 Distant Recurrence Risk at 9 years with Ai or Tamoxifen alone: 18% Absolute Chemotherapy Benefit: > 15 %   08/08/2017 -  Chemotherapy   Adjuvant chemo Taxotere/Cytoxanq3 weeks x4 cycles with Udenyca -Granix added from cycle 2. Discontinued Udenyca following cycle 2.    11/10/2017 - 12/08/2017 Radiation Therapy   Radiation treatment dates:   11/10/2017 to 12/08/2017  Site/dose:    1. The Right breast was treated to 40.05 Gy in 15 fractions of 2.67 Gy. 2. The Right breast was boosted to 10 Gy in  5 fractions of 2 Gy.  Beams/energy:    1. 3D// 10X 2. Isodose plan w/photons // 10X, 6X  Narrative: The patient tolerated radiation treatment relatively well.   She developed moderate hyperpigmentation to  the right breast with treatment. She denies any pain or fatigue. She continues to use radiaplex twice daily as directed.    12/2017 - 11/2018 Anti-estrogen oral therapy   started Tamoxifen 45m daily in 12/2017. Stopped in 11/2018 due to cancer recurrance. Restarted in early 12/2018 and switched to Letrozole on 01/14/19.    08/11/2018 Survivorship   SCP virtual visit per LCira Rue NP    10/27/2018 Imaging   CT CAP W Contrast 10/27/18  IMPRESSION: 1. Multifocal lytic bone metastases are identified involving the left ribs, L2 vertebra, and right iliac wing. There is of pathologic fracture involving the L2 vertebra. 2. Single small, nonspecific pulmonary nodule in the right upper lobe is noted. 3. Ill-defined low-density structure in right lobe of liver measures 1.3 cm and is indeterminate. This may be better characterized with contrast enhanced liver MRI or PET-CT.   10/28/2018 Imaging   Whole Body Bone Scan 10/28/18  IMPRESSION: Sites of uptake in the left third and sixth ribs, right iliac crest and L2 vertebral body are compatible with sites of osseous metastatic disease with lesions visualized on CT from 1 day prior   Non-specific soft tissue uptake is seen at the site of prior right lumpectomy.   Likely urinary contamination in the soft tissues of the right pelvis, buttock and right flank.   11/12/2018 Relapse/Recurrence   Diagnosis 1. Bone, biopsy, Lumbar 2, Left Pedicle - BONE WITH MARROW FIBROSIS. - NO MALIGNANCY IDENTIFIED. 2. Bone, biopsy, Lumbar 2, Right Pedicle - METASTATIC CARCINOMA, SEE COMMENT.  The tumor cells are NEGATIVE for Her2 (0). Estrogen Receptor: 0%, NEGATIVE Progesterone Receptor: 0%, NEGATIVE   12/02/2018 - 12/18/2018 Radiation Therapy   L2 palliative RT (12/02/18-12/09/18) and will start palliative RT to right iliac lesion 12/10/18 and completed on 12/18/18 with Dr. SIsidore Moos   12/03/2018 Pathology Results   Diagnosis 12/03/18 Bone, biopsy, iliac crest, right -  METASTATIC CARCINOMA. Results: IMMUNOHISTOCHEMICAL AND MORPHOMETRIC ANALYSIS PERFORMED MANUALLY The tumor cells are NEGATIVE for Her2 (1+). Estrogen Receptor: 80%, POSITIVE, STRONG STAINING INTENSITY Progesterone Receptor: 20%, POSITIVE, STRONG STAINING INTENSITY   12/16/2018 -  Chemotherapy   Zometa injection q380monthstarting 12/16/18. Due to flu like symptoms this was switched to monthly Xgeva with second dose on 03/17/19    12/28/2018 -  Chemotherapy   Ibrance 3 weeks on/1 week off starting 12/28/18 Letrozole once daily starting 01/14/19 with monthly Zoledex injections starting 12/16/18.    02/16/2019 Imaging   CT CAP W Contrast  IMPRESSION: Chest.   1. New peripheral consolidation in the LEFT upper lobe with air bronchograms adjacent to lytic lesions within the third and sixth rib is favored radiation change. Recommend attention on follow-up. 2. No new or progressive breast cancer in thorax.   Abdomen / Pelvis.   1. No evidence of metastatic disease in the soft tissues of the abdomen pelvis. 2. Increase in size of lytic metastasis in the RIGHT iliac CREST. 3. Interval L2 kyphoplasty at site a pathologic fracture.     02/16/2019 Imaging   Whole Body bone scan  IMPRESSION: 1. Stable to improved uptake associated with previous left anterior rib lesions. 2. Increased uptake within known lytic metastasis involving the L2 vertebra and anterior right iliac crest. 3. No new sites of disease.   04/29/2019 Imaging  MRI brain  IMPRESSION: 1.9 x 1.2 x 0.8 cm mass of the inferior fourth ventricle, extending at the foramen of Luschka on the right. Findings most worrisome for ependymoma. See above discussion.   These results will be called to the ordering clinician or representative by the Radiologist Assistant, and communication documented in the PACS or zVision Dashboard.   05/17/2019 Imaging   MRI brain  IMPRESSION: 1. Status post fourth ventricle lesion resection. Small amount  of residual contrast enhancement within the dorsal parenchyma at the level of the cervicomedullary junction, favored to be postsurgical. This will serve as a baseline for future studies. 2. Small focus of acute ischemia within the medial right cerebellar hemisphere. 3. Pneumocephalus.   05/17/2019 Surgery   Suboccipital craniotomy for tumor resection with brainlab by Dr. Zada Finders. Only 0.2cm of tumor was removed and biopsied. He was not able to fully resect it.     05/17/2019 Pathology Results   FINAL MICROSCOPIC DIAGNOSIS:   A. BRAIN, TUMOR, BIOPSY:  - Metastatic carcinoma, see comment.   COMMENT:   Immunohistochemistry is positive for cytokeratin 7 and GATA-3.  Cytokeratin 20 is negative. The findings are consistent with metastatic  breast carcinoma. Prognostic markers have been ordered.  ADDENDUM:   PROGNOSTIC INDICATOR RESULTS:  The tumor cells are EQUIVOCAL for Her2 (2+).   Estrogen Receptor: 95%, STRONG STAINING INTESITY  Progesterone Receptor: 20%, STRONG STAINING INTESITY  GROUP 5:   HER2 **NEGATIVE**    05/31/2019 Imaging   MRI brain  IMPRESSION: 1. Postsurgical changes from partial resection of a medullary lesion with persistent nodular contrast enhancement, not significantly changed from prior MRI. 2. Interval development of contrast enhancement in the anterior inferior aspect of the cerebellum, in the area of previously seen restricted diffusion, consistent with post ischemic changes, although concurrent tumor extension cannot be entirely excluded.   06/07/2019 Imaging   CT CAP W contrast  IMPRESSION: 1. Signs of pleural and parenchymal scarring in the left chest associated with radiotherapy with diminished consolidative change compared to the prior study. 2. Stable 4 mm perifissural nodule along the major fissure of the right chest. 3. Increasing sclerosis of left ribs as described may reflect response to therapy with healing. 4. New lytic focus with  sclerotic margin in the right iliac bone posteriorly measuring 1.8 cm. New small focus of sclerosis in the posterior left acetabulum measuring approximately 11 mm. These areas were not visible on the prior study, perhaps reflecting interval healing, related to ongoing therapy given findings of increased sclerosis and other areas though mixed response is considered. Close attention on follow-up is suggested. 5. Redemonstration of destructive process in the right iliac bone anteriorly along the right iliac CREST, greatest axial dimension approximately 4.1 cm as compared to 4.3 cm. May also reflect post treatment related improvement given further sclerosis, attention on follow-up. 6. New small focus of sclerosis along the lateral margin of the greater trochanter (image 93, no measurable lesion in this location). Attention on subsequent bone scan and follow-up evaluations. Is   06/07/2019 Imaging   Bone Scan whole body  IMPRESSION: 1. Stable areas of increased activity noted over the left anterior third and sixth ribs. Stable area of increased activity noted in L2.   2. Subtle new foci of increased activity noted over the lower thoracic spine. A new proximal right femoral focus of increased activity also noted. Findings suggest progressive metastatic disease.     06/07/2019 - 06/24/2019 Radiation Therapy   Brain Radiation by Dr. Isidore Moos  CURRENT THERAPY:  -Zometa infusionq26month starting 12/16/18, Due to flu like symptoms will switchedto monthlyXgevawith second dosein 03/2019 -Monthly Zoladex injections starting9/2/20 -Tamoxifen while starting zoladex and awaiting to start AI, then switched toLetrozole with 2nd Zoladex injection10/1/20. -Ibrance 3 weeks on/1 week off starting9/14/20.Held week before-one week after brain surgery in 05/2019.  -Brain RT starting 06/07/19-06/24/19  INTERVAL HISTORY: Ms. HStalderreturns for f/u as scheduled. She was last seen 06/10/19. She  completed 3 weeks on Ibrance on 3/7. She continues brain RT. She feels OK just tired, remains functional at home with effort. She has to force herself to eat bc of taste change. Denies mucositis. Has vomited 2-3 times while on radiation. Takes compazine and zofran daily prophylaxis. Has 2-3 BMs per week which is normal for her. Denies bleeding. Denies fever, chills, cough, chest pain, dyspnea. Neuropathy in feet and right face have improved, right hand is stable. Continues gabapentin. Pain overall is stable. Right hip pain remains 5/10, uses cane. Takes oxycodone 1 in am 2 at night, and MS contin at night, supplements with NSAIDs during the day which is partially effective. She has positive outlook. She was angry about having to start brain RT but better now.    MEDICAL HISTORY:  Past Medical History:  Diagnosis Date   Anemia    Anxiety    Cancer ([place] dx'd 06/2017   breast cancer right   Depression    Diabetes mellitus without complication ([facility])    GERD (gastroesophageal reflux disease)    Headache    History of radiation therapy 11/10/17- 12/08/17   right breast, 40.05 Gy in 15 fractions. Right breast boost 10 Gy in 5 fractions.    Metastatic cancer to bone ([facility] dx'd 09/2018   pelvis, spine and ribs   Personal history of chemotherapy    Personal history of radiation therapy     SURGICAL HISTORY: Past Surgical History:  Procedure Laterality Date   APPLICATION OF CRANIAL NAVIGATION N/A 05/17/2019   Procedure: APPLICATION OF CRANIAL NAVIGATION;  Surgeon: OJudith Part MD;  [facility]  Service: Neurosurgery;  Laterality: N/A;   CERVICAL CONE BIOPSY     CHOLECYSTECTOMY     CRANIOTOMY N/A 05/17/2019   Procedure: Suboccipital craniotomy for tumor resection with brainlab;  Surgeon: OJudith Part MD;  [facility]  Service: Neurosurgery;  Laterality: N/A;  posterior/suboccipital   IR RADIOLOGIST EVAL & MGMT  11/03/2018   KYPHOPLASTY N/A 11/12/2018    Procedure: LUMBAR TWO  BIOPSY AND LUMBAR TWO KYPHOPLASTY;  Surgeon: BMelina Schools MD;  [facility]  Service: Orthopedics;  Laterality: N/A;  90 mins   MASTECTOMY WITH RADIOACTIVE SEED GUIDED EXCISION AND AXILLARY SENTINEL LYMPH NODE BIOPSY Right 07/08/2017   Procedure: RIGHT LUMPTECTOMY WITH RADIOACTIVE SEED GUIDED EXCISION AND RIGHT AXILLARY SENTINEL LYMPH NODE BIOPSY ERAS PATHWAY;  Surgeon: WRolm Bookbinder MD;  [facility]  Service: General;  Laterality: Right;  PEC BLOCK   OPEN REDUCTION INTERNAL FIXATION (ORIF) DISTAL RADIAL FRACTURE  03/17/2012   Procedure: OPEN REDUCTION INTERNAL FIXATION (ORIF) DISTAL RADIAL FRACTURE;  Surgeon: WRoseanne Kaufman MD;  [facility]  Service: Orthopedics;  Laterality: Right;  Pre-operative a supra-clavical block right arm in addition to general anesthesia   PORT-A-CATH REMOVAL Right 02/02/2018   Procedure: REMOVAL PORT-A-CATH;  Surgeon: WRolm Bookbinder MD;  [place] [facility]  Service: General;  Laterality: Right;   PORTACATH PLACEMENT N/A 07/30/2017   Procedure: INSERTION PORT-A-CATH WITH [place]  Surgeon: WRolm Bookbinder MD;  [facility];  Service: General;  Laterality: N/A;   TUBAL LIGATION      I have reviewed the social history and family history with the patient and they are unchanged from previous note.  ALLERGIES:  has No Known Allergies.  MEDICATIONS:  Current Outpatient Medications  Medication Sig Dispense Refill   blood glucose meter kit and supplies KIT Dispense based on patient and insurance preference. Use up to four times daily as directed. (FOR ICD-9 250.00, 250.01). 1 each 0   gabapentin (NEURONTIN) 300 MG capsule Take 1 capsule morning and afternoon and 2 capsules at bedtime (Patient taking differently: Take 300-600 mg by mouth See admin instructions. Take 300 mg by mouth in the morning and afternoon and 600 mg at bedtime) 270 capsule 3   letrozole (FEMARA) 2.5 MG tablet TAKE 1 TABLET BY  MOUTH EVERY DAY (Patient taking differently: Take 2.5 mg by mouth daily. ) 90 tablet 1   metFORMIN (GLUCOPHAGE) 500 MG tablet TAKE 1 TABLET (500 MG TOTAL) BY MOUTH 2 (TWO) TIMES DAILY WITH A MEAL. 180 tablet 0   morphine (MS CONTIN) 15 MG 12 hr tablet Take 1 tablet (15 mg total) by mouth at bedtime. 30 tablet 0   Multiple Vitamin (MULTIVITAMIN WITH MINERALS) TABS tablet Take 1 tablet by mouth daily.     ondansetron (ZOFRAN) 8 MG tablet TAKE 1 TABLET (8 MG TOTAL) BY MOUTH EVERY 8 (EIGHT) HOURS AS NEEDED FOR NAUSEA OR VOMITING. 30 tablet 3   oxyCODONE (OXY IR/ROXICODONE) 5 MG immediate release tablet Take 1 tablet (5 mg total) by mouth every 8 (eight) hours as needed for severe pain. 45 tablet 0   palbociclib (IBRANCE) 125 MG tablet TAKE 1 TABLET (125 MG TOTAL) BY MOUTH DAILY. TAKE FOR 21 DAYS ON, 7 DAYS OFF, REPEAT EVERY 28 DAYS. 21 tablet 0   potassium chloride SA (KLOR-CON M20) 20 MEQ tablet Take 1 tablet (20 mEq total) by mouth daily. 90 tablet 1   prochlorperazine (COMPAZINE) 10 MG tablet Take 1 tablet (10 mg total) by mouth every 6 (six) hours as needed for nausea or vomiting. 30 tablet 2   MELATONIN ER PO Take 15 mg by mouth at bedtime as needed (sleep).      polyethylene glycol (MIRALAX) 17 g packet Take 17 g by mouth daily as needed. (Patient taking differently: Take 17 g by mouth daily as needed for mild constipation. ) 14 each 8   No current facility-administered medications for this visit.    PHYSICAL EXAMINATION: ECOG PERFORMANCE STATUS: 2 - Symptomatic, <50% confined to bed  Vitals:   06/21/19 0912  BP: 118/84  Pulse: 90  Resp: 18  Temp: 99.1 F (37.3 C)  SpO2: 100%   Filed Weights   06/21/19 0912  Weight: 268 lb 11.2 oz (121.9 kg)    GENERAL:alert, no distress and comfortable SKIN: no rash  EYES:  sclera clear OROPHARYNX: no thrush or ulcers  NECK: without mass  LUNGS: clear with normal breathing effort HEART: regular rate & rhythm, no lower extremity  edema ABDOMEN: abdomen soft, non-tender and normal bowel sounds NEURO: alert & oriented x 3 with fluent speech  LABORATORY DATA:  I have reviewed the data as listed CBC Latest Ref Rng & Units 06/21/2019 06/07/2019 05/17/2019  WBC 4.0 - 10.5 K/uL 1.7(L) 6.6 4.2  Hemoglobin 12.0 - 15.0 g/dL 11.8(L) 12.4 12.0  Hematocrit 36.0 - 46.0 % 36.0 37.9 36.5  Platelets 150 - 400 K/uL 127(L) 307 233     CMP Latest  Ref Rng & Units 06/21/2019 06/07/2019 05/17/2019  Glucose 70 - 99 mg/dL 96 109(H) -  BUN 6 - 20 mg/dL 13 15 -  Creatinine 0.44 - 1.00 mg/dL 0.86 0.82 0.78  Sodium 135 - 145 mmol/L 139 140 -  Potassium 3.5 - 5.1 mmol/L 3.3(L) 3.9 -  Chloride 98 - 111 mmol/L 104 103 -  CO2 22 - 32 mmol/L 28 26 -  Calcium 8.9 - 10.3 mg/dL 8.3(L) 9.0 -  Total Protein 6.5 - 8.1 g/dL 7.1 8.0 -  Total Bilirubin 0.3 - 1.2 mg/dL 0.3 0.3 -  Alkaline Phos 38 - 126 U/L 50 65 -  AST 15 - 41 U/L 57(H) 17 -  ALT 0 - 44 U/L 55(H) 18 -      RADIOGRAPHIC STUDIES: I have personally reviewed the radiological images as listed and agreed with the findings in the report. No results found.   ASSESSMENT & PLAN: Caitlin Cohen a 54 y.o.femalewith   1. Malignant neoplasm of the upper-outer quadrant of right breast, base of ductal carcinoma, pT2N0M0, stage IIA, grade 3, ER+ /PR +, HER2 -, stage IB, Oncotype RS 29, metastatic bone disease in 10/2018, oligo brain metastasis in 1/21 s/p resection and RT  -She was diagnosed in 06/2017. She is s/p right breastlumpectomy, adjuvantchemoTC and adjuvant radiationand was treated with Tamoxifen since 12/2017. Due to disease progression, she stoppedon 01/14/19. -7/2020CT CAP/bone scan concerning for bone metastasis.She also has anindeterminate 1.3 cm liver lesion. -S/p underwent L2 kyphoplasty for compression fractureby Dr. Rolena Infante, bone biopsy showed triple negative breast cancer.  -Repeated bone biopsy from the right iliac lesion, which showed ER and PR positive HER-2  negative, consistent with metastatic right breast cancer. First bone biopsy ER and PRfelt to befalsely negative due to the decalcification. -S/ppalliative RT with Dr. Isidore Moos on 12/18/18; her back and pelvic pain improved but not resolved -Given her ER/PR positive metastasis,Dr. Burr Medico started her onovarian suppression and antiestrogen therapy with aromatase inhibitorLetrozoledaily and oral CD4K inhibitor Ibrance 3 weeks on/1 week off.She started Ibrance on9/14/20.she tolerated cycle 1 moderately well with fatigue and nausea -Given she is still perimenopausal, she started monthly Zoladex injections on 12/16/18 in combination with Letrozole which shestarted10/1/20. She willconsiderBSO down the road. -She developed increased dizziness shortly after 2nd zoladex and beginning letrozole. Dizziness worsened after she started effexor for hot flashes and ultimately she fell at home. effexor discontinued and dizziness resolved.  -per instruction she was told to increase gabapentin to 600 mg at night, continue 300 mg AM/PM, which helped her hot flashes -Due to chills and flu like symptoms that were felt to be related to zometa, Dr. Burr Medico recommended to change to [facility], first injection today 03/2019 -Restaging CT on 02/16/19 showed increased size of lytic lesion in right iliac crest and increased uptake in L2 compared to 10/2018. The previously seen liver lesion had resolved. No new metastasis. Dr. Burr Medico felt the progression was from before she started [facility] and recommended for her to continue treatment. However, CT and bone scan on 06/07/19 showed further disease progression. Dr. Burr Medico ordered [facility] PET to see if she has heterogeneous ER+ expression, but was denied by insurance.  -Due to facial paresthesias, she underwent brain MRI on 04/29/19 that showed 1.9 x1.2 x0.8 mass in the inferior 4th ventricle. She was referred to Dr. Mickeal Skinner, case discussed in tumor board. She underwent suboccipital craniotomy and  partial resection of brainstem mass on 05/17/19.  -She started RT to brainstem/posterior fossa per Dr. Isidore Moos on 2/22, plan  to complete 06/24/19.  -Ms [place] appears stable. She continues Ibrance and brain RT. She tolerates treatment mostly well except fatigue. She will complete brain RT on 06/24/19.  -Labs reviewed, she has mild pancytopenia, secondary to [place]. We reviewed neutropenic precautions. K 3.3, I recommend to increase oral K to 1 tab BID. CMP otherwise stable. CA 17.29 is stable lately.  -PDL1 and FO are pending. -She will return for lab on 3/11, her last day or RT, to see if neutropenia has resolved. We will notify her before starting next cycle of Ibrance (planned for 3/15).  -F/u 3/25 with Dr. 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 -Her 09/2018 MRI spine was concerning for bone metastasisand L2 spine fracture, likely pathologic. -CT CAP from 10/27/18 and bone scan from 7/15/20whichshowuptake in left 3rd and 6th ribs, right iliac crest andlarge 4.9cm lytic lesion inL2 with pathologic fracture. -She underwent L2 Kyphoplastyand biopsy by Dr. Georgiann Mohs 11/12/18. -Her8/20/20 bone biopsy without decalcification showed her metastasis to be ER/PE positive and her2 negative.  -Shecompleted L2 palliative RT (12/02/18-12/09/18) and palliative RIto right iliac lesion (12/10/18-12/18/18)with Dr. Isidore Moos.  -Continuecalcium 1000 unitsdailyand gabapentin -Pain is controlled, takes MS contin at night, oxycodone 1 tab AM and 2 PM with intermittent NSAIDs. This is overall effective. meds refilled today   3. N&V, Mild Constipation  -managed with compazine and zofran BID preventatively, and laxatives PRN.  -she has vomited 2-3 times during brain RT, otherwise not much  n/v -monitoring   4. Depression/Anxiety  -worsened with disease progression, however her outlook remains positive -declined SW/chaplain resources today -stable, will continue monitoring   5. Mild neutropenia -secondary to Ibrance -ANC 1.1 today, afebrile. We reviewed neutropenic precautions.  -repeat labs 3/11 before starting next cycle   6.Goal of care discussion  -The patient understands the goal of care is palliative. -she is full code now  7. DM -diagnosed after initial chemo -started metformin 500 mg BID -BG stable lately   8. Hypokalemia  -on 20 mEq daily -K 3.3 today, I recommend to increase to 1 tab BID -repeat lab this week   PLAN: -Labs reviewed -Reviewed neutropenic precautions -Repeat labs 3/11 before next cycle, will await our instruction prior to starting  -Increase K to 1 tab BID -Refilled MS contin, oxycodone, K -Continue RT, complete 3/11 -Xgeva, zoladex, lab, f/u with Dr. Burr Medico on 3/25 to discuss next therapy   No problem-specific Assessment & Plan notes found for this encounter.   No orders of the defined types were placed in this encounter.  All questions were answered. The patient knows to call the clinic with any problems, questions or concerns. No barriers to learning was detected.     [name], NP 06/21/19

## 2019-06-21 ENCOUNTER — Inpatient Hospital Stay: Payer: Medicaid Other | Attending: Hematology | Admitting: Nurse Practitioner

## 2019-06-21 ENCOUNTER — Encounter: Payer: Self-pay | Admitting: Nurse Practitioner

## 2019-06-21 ENCOUNTER — Inpatient Hospital Stay: Payer: Medicaid Other

## 2019-06-21 ENCOUNTER — Ambulatory Visit: Payer: Medicaid Other

## 2019-06-21 ENCOUNTER — Other Ambulatory Visit: Payer: Self-pay

## 2019-06-21 ENCOUNTER — Ambulatory Visit
Admission: RE | Admit: 2019-06-21 | Discharge: 2019-06-21 | Disposition: A | Discharge status: Home / Self Care | Payer: Medicaid Other | Source: Ambulatory Visit | Attending: Radiation Oncology | Admitting: Radiation Oncology

## 2019-06-21 VITALS — BP 118/84 | HR 90 | Temp 99.1°F | Resp 18 | Ht 67.0 in | Wt 268.7 lb

## 2019-06-21 DIAGNOSIS — K5900 Constipation, unspecified: Secondary | ICD-10-CM | POA: Diagnosis not present

## 2019-06-21 DIAGNOSIS — D696 Thrombocytopenia, unspecified: Secondary | ICD-10-CM | POA: Diagnosis not present

## 2019-06-21 DIAGNOSIS — F329 Major depressive disorder, single episode, unspecified: Secondary | ICD-10-CM | POA: Insufficient documentation

## 2019-06-21 DIAGNOSIS — F419 Anxiety disorder, unspecified: Secondary | ICD-10-CM | POA: Diagnosis not present

## 2019-06-21 DIAGNOSIS — Z170 Estrogen receptor positive status [ER+]: Secondary | ICD-10-CM | POA: Diagnosis not present

## 2019-06-21 DIAGNOSIS — T451X5A Adverse effect of antineoplastic and immunosuppressive drugs, initial encounter: Secondary | ICD-10-CM | POA: Insufficient documentation

## 2019-06-21 DIAGNOSIS — C7931 Secondary malignant neoplasm of brain: Secondary | ICD-10-CM | POA: Diagnosis not present

## 2019-06-21 DIAGNOSIS — D701 Agranulocytosis secondary to cancer chemotherapy: Secondary | ICD-10-CM | POA: Diagnosis not present

## 2019-06-21 DIAGNOSIS — E119 Type 2 diabetes mellitus without complications: Secondary | ICD-10-CM | POA: Insufficient documentation

## 2019-06-21 DIAGNOSIS — M25551 Pain in right hip: Secondary | ICD-10-CM | POA: Insufficient documentation

## 2019-06-21 DIAGNOSIS — E876 Hypokalemia: Secondary | ICD-10-CM | POA: Diagnosis not present

## 2019-06-21 DIAGNOSIS — R112 Nausea with vomiting, unspecified: Secondary | ICD-10-CM | POA: Diagnosis not present

## 2019-06-21 DIAGNOSIS — C50211 Malignant neoplasm of upper-inner quadrant of right female breast: Secondary | ICD-10-CM

## 2019-06-21 DIAGNOSIS — Z923 Personal history of irradiation: Secondary | ICD-10-CM | POA: Diagnosis not present

## 2019-06-21 DIAGNOSIS — Z510 Encounter for antineoplastic radiation therapy: Secondary | ICD-10-CM | POA: Diagnosis not present

## 2019-06-21 DIAGNOSIS — Z9221 Personal history of antineoplastic chemotherapy: Secondary | ICD-10-CM | POA: Insufficient documentation

## 2019-06-21 DIAGNOSIS — C7951 Secondary malignant neoplasm of bone: Secondary | ICD-10-CM | POA: Diagnosis not present

## 2019-06-21 LAB — CBC WITH DIFFERENTIAL (CANCER CENTER ONLY)
Abs Immature Granulocytes: 0.01 10*3/uL (ref 0.00–0.07)
Basophils Absolute: 0 10*3/uL (ref 0.0–0.1)
Basophils Relative: 1 %
Eosinophils Absolute: 0 10*3/uL (ref 0.0–0.5)
Eosinophils Relative: 2 %
HCT: 36 % (ref 36.0–46.0)
Hemoglobin: 11.8 g/dL — ABNORMAL LOW (ref 12.0–15.0)
Immature Granulocytes: 1 %
Lymphocytes Relative: 23 %
Lymphs Abs: 0.4 10*3/uL — ABNORMAL LOW (ref 0.7–4.0)
MCH: 32.2 pg (ref 26.0–34.0)
MCHC: 32.8 g/dL (ref 30.0–36.0)
MCV: 98.1 fL (ref 80.0–100.0)
Monocytes Absolute: 0.1 10*3/uL (ref 0.1–1.0)
Monocytes Relative: 5 %
Neutro Abs: 1.1 10*3/uL — ABNORMAL LOW (ref 1.7–7.7)
Neutrophils Relative %: 68 %
Platelet Count: 127 10*3/uL — ABNORMAL LOW (ref 150–400)
RBC: 3.67 MIL/uL — ABNORMAL LOW (ref 3.87–5.11)
RDW: 13.9 % (ref 11.5–15.5)
WBC Count: 1.7 10*3/uL — ABNORMAL LOW (ref 4.0–10.5)
nRBC: 0 % (ref 0.0–0.2)

## 2019-06-21 LAB — CMP (CANCER CENTER ONLY)
ALT: 55 U/L — ABNORMAL HIGH (ref 0–44)
AST: 57 U/L — ABNORMAL HIGH (ref 15–41)
Albumin: 3.7 g/dL (ref 3.5–5.0)
Alkaline Phosphatase: 50 U/L (ref 38–126)
Anion gap: 7 (ref 5–15)
BUN: 13 mg/dL (ref 6–20)
CO2: 28 mmol/L (ref 22–32)
Calcium: 8.3 mg/dL — ABNORMAL LOW (ref 8.9–10.3)
Chloride: 104 mmol/L (ref 98–111)
Creatinine: 0.86 mg/dL (ref 0.44–1.00)
GFR, Est AFR Am: >60 mL/min (ref >60)
GFR, Estimated: >60 mL/min (ref >60)
Glucose, Bld: 96 mg/dL (ref 70–99)
Potassium: 3.3 mmol/L — ABNORMAL LOW (ref 3.5–5.1)
Sodium: 139 mmol/L (ref 135–145)
Total Bilirubin: 0.3 mg/dL (ref 0.3–1.2)
Total Protein: 7.1 g/dL (ref 6.5–8.1)

## 2019-06-21 MED FILL — IBRANCE 125 MG TABS: 125 | 28 days supply | Qty: 21 | Fill #0

## 2019-06-22 ENCOUNTER — Ambulatory Visit
Admission: RE | Admit: 2019-06-22 | Discharge: 2019-06-22 | Disposition: A | Discharge status: Home / Self Care | Payer: Medicaid Other | Source: Ambulatory Visit | Attending: Radiation Oncology | Admitting: Radiation Oncology

## 2019-06-22 ENCOUNTER — Ambulatory Visit: Payer: Medicaid Other

## 2019-06-22 ENCOUNTER — Telehealth: Payer: Self-pay | Admitting: Nurse Practitioner

## 2019-06-22 ENCOUNTER — Other Ambulatory Visit: Payer: Self-pay

## 2019-06-22 ENCOUNTER — Other Ambulatory Visit: Payer: Self-pay | Admitting: Nurse Practitioner

## 2019-06-22 DIAGNOSIS — Z510 Encounter for antineoplastic radiation therapy: Secondary | ICD-10-CM | POA: Diagnosis not present

## 2019-06-22 LAB — CANCER ANTIGEN 27.29: CA 27.29: 60.2 U/mL — ABNORMAL HIGH (ref 0.0–38.6)

## 2019-06-22 MED ORDER — POTASSIUM CHLORIDE CRYS ER 20 MEQ PO TBCR: 20.0000 meq | EXTENDED_RELEASE_TABLET | Freq: Two times a day (BID) | ORAL | 1 refills | Status: AC

## 2019-06-22 MED ORDER — OXYCODONE HCL 5 MG PO TABS: 5.0000 mg | ORAL_TABLET | Freq: Three times a day (TID) | ORAL | 0 refills | Status: DC | PRN

## 2019-06-22 MED ORDER — MORPHINE SULFATE ER 15 MG PO TBCR: 15.0000 mg | EXTENDED_RELEASE_TABLET | Freq: Every day | ORAL | 0 refills | Status: DC

## 2019-06-22 NOTE — Telephone Encounter (Signed)
Scheduled appt per 3/8 los.  Spoke with pt and she is aware of her appt date and time.

## 2019-06-22 NOTE — Addendum Note (Signed)
Addended by: [name] on: 06/22/2019 08:25 AM   Modules accepted: Orders

## 2019-06-23 ENCOUNTER — Other Ambulatory Visit: Payer: Self-pay

## 2019-06-23 ENCOUNTER — Ambulatory Visit
Admission: RE | Admit: 2019-06-23 | Discharge: 2019-06-23 | Disposition: A | Discharge status: Home / Self Care | Payer: Medicaid Other | Source: Ambulatory Visit | Attending: Radiation Oncology | Admitting: Radiation Oncology

## 2019-06-23 DIAGNOSIS — Z510 Encounter for antineoplastic radiation therapy: Secondary | ICD-10-CM | POA: Diagnosis not present

## 2019-06-24 ENCOUNTER — Ambulatory Visit: Payer: Medicaid Other

## 2019-06-24 ENCOUNTER — Telehealth: Payer: Self-pay

## 2019-06-24 ENCOUNTER — Inpatient Hospital Stay: Payer: Medicaid Other

## 2019-06-24 NOTE — Telephone Encounter (Signed)
Caitlin Cohen left vm requesting lab apot be moved to tomorrow.  She stated she wasn't feeling well.  I returned her call she states she has been dizzy with diarrhea 5 liquid stools since last evening. She does have mild nausea.  She denies loss of taste and smell. She does not think she has a fever, however she does not have a thermometer. I reviewed with Caitlin Rue, NP this is most likely from her Ibrance.  I reviewed with Caitlin Cohen.  I told her to start imodium 2 tabs now and the 1 tab after every loose stool.  I asked her to call back in a couple of hours to update [place] on how she is doing.  She verbalized understanding.

## 2019-06-25 ENCOUNTER — Ambulatory Visit
Admission: RE | Admit: 2019-06-25 | Discharge: 2019-06-25 | Disposition: A | Discharge status: Home / Self Care | Payer: Medicaid Other | Source: Ambulatory Visit | Attending: Radiation Oncology | Admitting: Radiation Oncology

## 2019-06-25 ENCOUNTER — Encounter: Payer: Self-pay | Admitting: Radiation Oncology

## 2019-06-25 ENCOUNTER — Other Ambulatory Visit: Payer: Self-pay

## 2019-06-25 ENCOUNTER — Inpatient Hospital Stay: Payer: Medicaid Other

## 2019-06-25 DIAGNOSIS — Z510 Encounter for antineoplastic radiation therapy: Secondary | ICD-10-CM | POA: Diagnosis not present

## 2019-06-25 DIAGNOSIS — C50211 Malignant neoplasm of upper-inner quadrant of right female breast: Secondary | ICD-10-CM | POA: Diagnosis not present

## 2019-06-25 DIAGNOSIS — Z170 Estrogen receptor positive status [ER+]: Secondary | ICD-10-CM

## 2019-06-25 LAB — CBC WITH DIFFERENTIAL (CANCER CENTER ONLY)
Abs Immature Granulocytes: 0 10*3/uL (ref 0.00–0.07)
Basophils Absolute: 0 10*3/uL (ref 0.0–0.1)
Basophils Relative: 2 %
Eosinophils Absolute: 0 10*3/uL (ref 0.0–0.5)
Eosinophils Relative: 2 %
HCT: 37.9 % (ref 36.0–46.0)
Hemoglobin: 12.6 g/dL (ref 12.0–15.0)
Immature Granulocytes: 0 %
Lymphocytes Relative: 30 %
Lymphs Abs: 0.6 10*3/uL — ABNORMAL LOW (ref 0.7–4.0)
MCH: 31.9 pg (ref 26.0–34.0)
MCHC: 33.2 g/dL (ref 30.0–36.0)
MCV: 95.9 fL (ref 80.0–100.0)
Monocytes Absolute: 0.1 10*3/uL (ref 0.1–1.0)
Monocytes Relative: 6 %
Neutro Abs: 1.2 10*3/uL — ABNORMAL LOW (ref 1.7–7.7)
Neutrophils Relative %: 60 %
Platelet Count: 140 10*3/uL — ABNORMAL LOW (ref 150–400)
RBC: 3.95 MIL/uL (ref 3.87–5.11)
RDW: 14.3 % (ref 11.5–15.5)
WBC Count: 2 10*3/uL — ABNORMAL LOW (ref 4.0–10.5)
nRBC: 0 % (ref 0.0–0.2)

## 2019-06-25 LAB — CMP (CANCER CENTER ONLY)
ALT: 119 U/L — ABNORMAL HIGH (ref 0–44)
AST: 76 U/L — ABNORMAL HIGH (ref 15–41)
Albumin: 3.9 g/dL (ref 3.5–5.0)
Alkaline Phosphatase: 57 U/L (ref 38–126)
Anion gap: 11 (ref 5–15)
BUN: 9 mg/dL (ref 6–20)
CO2: 28 mmol/L (ref 22–32)
Calcium: 9.2 mg/dL (ref 8.9–10.3)
Chloride: 104 mmol/L (ref 98–111)
Creatinine: 1.11 mg/dL — ABNORMAL HIGH (ref 0.44–1.00)
GFR, Est AFR Am: >60 mL/min (ref >60)
GFR, Estimated: 57 mL/min — ABNORMAL LOW (ref >60)
Glucose, Bld: 139 mg/dL — ABNORMAL HIGH (ref 70–99)
Potassium: 3.3 mmol/L — ABNORMAL LOW (ref 3.5–5.1)
Sodium: 143 mmol/L (ref 135–145)
Total Bilirubin: 0.4 mg/dL (ref 0.3–1.2)
Total Protein: 7.4 g/dL (ref 6.5–8.1)

## 2019-06-26 LAB — CANCER ANTIGEN 27.29: CA 27.29: 65.9 U/mL — ABNORMAL HIGH (ref 0.0–38.6)

## 2019-06-28 ENCOUNTER — Telehealth: Payer: Self-pay | Admitting: Nurse Practitioner

## 2019-06-28 MED ORDER — GABAPENTIN 300 MG PO CAPS: 300.0000 mg | ORAL_CAPSULE | ORAL | 2 refills | Status: AC

## 2019-06-28 NOTE — Telephone Encounter (Signed)
I called Ms. Wehrer to review labs from 3/11 and check on her status. Her diarrhea, vomiting, and dizziness resolved over the weekend. She feels better. Has been drinking and eating more. She finished radiation on 3/11.   Labs show ANC 1.2, K 3.3, Cr 1.11. We reviewed neutropenic precautions. She continues oral K BID. I encouraged her to drink more and monitor for fever. She will start next Ibrance cycle today as planned. She will return for lab, f/u and xgeva and zoladex injections on 3/25. She knows to call sooner with new fever/chills, respiratory symptoms, or GI symptoms that do not respond to supportive meds. She understands and appreciates the call.   [name], NP

## 2019-06-30 ENCOUNTER — Other Ambulatory Visit: Payer: Self-pay | Admitting: Radiation Oncology

## 2019-06-30 DIAGNOSIS — C7931 Secondary malignant neoplasm of brain: Secondary | ICD-10-CM

## 2019-06-30 MED ORDER — DEXAMETHASONE 4 MG PO TABS: 4.0000 mg | ORAL_TABLET | Freq: Three times a day (TID) | ORAL | 0 refills | Status: AC

## 2019-07-01 ENCOUNTER — Telehealth: Payer: Self-pay

## 2019-07-01 NOTE — Telephone Encounter (Signed)
Caitlin Cohen called me yesterday afternoon to report severe dizziness starting last week. She reported that it did ease of for one day, but returned and she has remained dizzy since that time. I informed Dr. Isidore Moos and she ordered her a decadron taper, which she was to begin last night. Yesterday afternoon I called Caitlin Cohen and informed her that the prescription for decadron had been sent in to her preferred pharmacy and Dr. Isidore Moos recommended that she pick it up this same day and start as soon as possible. Caitlin Cohen voiced her understanding of Dr. Pearlie Oyster recommendations. Dr. Isidore Moos also recommended that she see Dr. Joaquim Nam for evaluation as well. Caitlin Cohen informed me that she is already scheduled for an appointment on Friday 07/02/19. Caitlin Cohen knows to call me on Friday if her symptoms have not improved.

## 2019-07-06 ENCOUNTER — Other Ambulatory Visit: Payer: Self-pay | Admitting: Nurse Practitioner

## 2019-07-06 ENCOUNTER — Other Ambulatory Visit: Payer: Self-pay | Admitting: Radiation Therapy

## 2019-07-06 ENCOUNTER — Telehealth: Payer: Self-pay | Admitting: *Deleted

## 2019-07-06 DIAGNOSIS — C7949 Secondary malignant neoplasm of other parts of nervous system: Secondary | ICD-10-CM

## 2019-07-06 DIAGNOSIS — C7931 Secondary malignant neoplasm of brain: Secondary | ICD-10-CM

## 2019-07-06 MED ORDER — OXYCODONE HCL 5 MG PO TABS: 5.0000 mg | ORAL_TABLET | Freq: Three times a day (TID) | ORAL | 0 refills | Status: DC | PRN

## 2019-07-06 NOTE — Addendum Note (Signed)
Addended by: Pincus Large on: 07/06/2019 01:51 PM   Modules accepted: Orders

## 2019-07-06 NOTE — Telephone Encounter (Signed)
Done, thanks! LB

## 2019-07-06 NOTE — Telephone Encounter (Signed)
Patient requesting refill of oxycodone. States she has enough for tonight.

## 2019-07-08 ENCOUNTER — Telehealth: Payer: Self-pay

## 2019-07-08 ENCOUNTER — Emergency Department (HOSPITAL_COMMUNITY)
Admission: EM | Admit: 2019-07-08 | Discharge: 2019-07-08 | Disposition: A | Discharge status: Home / Self Care | Payer: Medicaid Other | Attending: Emergency Medicine | Admitting: Emergency Medicine

## 2019-07-08 ENCOUNTER — Other Ambulatory Visit: Payer: Self-pay

## 2019-07-08 ENCOUNTER — Emergency Department (HOSPITAL_COMMUNITY): Payer: Medicaid Other

## 2019-07-08 ENCOUNTER — Inpatient Hospital Stay: Payer: Medicaid Other

## 2019-07-08 ENCOUNTER — Encounter (HOSPITAL_COMMUNITY): Payer: Self-pay | Admitting: Emergency Medicine

## 2019-07-08 ENCOUNTER — Inpatient Hospital Stay: Payer: Medicaid Other | Admitting: Hematology

## 2019-07-08 DIAGNOSIS — R42 Dizziness and giddiness: Secondary | ICD-10-CM | POA: Insufficient documentation

## 2019-07-08 DIAGNOSIS — Z853 Personal history of malignant neoplasm of breast: Secondary | ICD-10-CM | POA: Diagnosis not present

## 2019-07-08 DIAGNOSIS — Z9221 Personal history of antineoplastic chemotherapy: Secondary | ICD-10-CM | POA: Diagnosis not present

## 2019-07-08 DIAGNOSIS — Z79899 Other long term (current) drug therapy: Secondary | ICD-10-CM | POA: Insufficient documentation

## 2019-07-08 DIAGNOSIS — R200 Anesthesia of skin: Secondary | ICD-10-CM | POA: Diagnosis not present

## 2019-07-08 DIAGNOSIS — Z7984 Long term (current) use of oral hypoglycemic drugs: Secondary | ICD-10-CM | POA: Diagnosis not present

## 2019-07-08 DIAGNOSIS — Z85841 Personal history of malignant neoplasm of brain: Secondary | ICD-10-CM | POA: Diagnosis not present

## 2019-07-08 DIAGNOSIS — E119 Type 2 diabetes mellitus without complications: Secondary | ICD-10-CM | POA: Diagnosis not present

## 2019-07-08 LAB — CBC WITH DIFFERENTIAL/PLATELET
Abs Immature Granulocytes: 0.05 10*3/uL (ref 0.00–0.07)
Basophils Absolute: 0 10*3/uL (ref 0.0–0.1)
Basophils Relative: 0 %
Eosinophils Absolute: 0 10*3/uL (ref 0.0–0.5)
Eosinophils Relative: 0 %
HCT: 38.3 % (ref 36.0–46.0)
Hemoglobin: 12.9 g/dL (ref 12.0–15.0)
Immature Granulocytes: 1 %
Lymphocytes Relative: 12 %
Lymphs Abs: 0.6 10*3/uL — ABNORMAL LOW (ref 0.7–4.0)
MCH: 32 pg (ref 26.0–34.0)
MCHC: 33.7 g/dL (ref 30.0–36.0)
MCV: 95 fL (ref 80.0–100.0)
Monocytes Absolute: 0.2 10*3/uL (ref 0.1–1.0)
Monocytes Relative: 4 %
Neutro Abs: 3.8 10*3/uL (ref 1.7–7.7)
Neutrophils Relative %: 83 %
Platelets: 299 10*3/uL (ref 150–400)
RBC: 4.03 MIL/uL (ref 3.87–5.11)
RDW: 14.2 % (ref 11.5–15.5)
WBC: 4.6 10*3/uL (ref 4.0–10.5)
nRBC: 0 % (ref 0.0–0.2)

## 2019-07-08 LAB — COMPREHENSIVE METABOLIC PANEL
ALT: 32 U/L (ref 0–44)
AST: 19 U/L (ref 15–41)
Albumin: 3.7 g/dL (ref 3.5–5.0)
Alkaline Phosphatase: 45 U/L (ref 38–126)
Anion gap: 8 (ref 5–15)
BUN: 28 mg/dL — ABNORMAL HIGH (ref 6–20)
CO2: 26 mmol/L (ref 22–32)
Calcium: 8.9 mg/dL (ref 8.9–10.3)
Chloride: 102 mmol/L (ref 98–111)
Creatinine, Ser: 0.86 mg/dL (ref 0.44–1.00)
GFR calc Af Amer: >60 mL/min (ref >60)
GFR calc non Af Amer: >60 mL/min (ref >60)
Glucose, Bld: 113 mg/dL — ABNORMAL HIGH (ref 70–99)
Potassium: 4.4 mmol/L (ref 3.5–5.1)
Sodium: 136 mmol/L (ref 135–145)
Total Bilirubin: 0.6 mg/dL (ref 0.3–1.2)
Total Protein: 6.8 g/dL (ref 6.5–8.1)

## 2019-07-08 MED ORDER — MECLIZINE HCL 25 MG PO TABS: 25.0000 mg | ORAL_TABLET | Freq: Three times a day (TID) | ORAL | 0 refills | Status: AC | PRN

## 2019-07-08 MED ORDER — MECLIZINE HCL 25 MG PO TABS
25.0000 mg | ORAL_TABLET | Freq: Once | ORAL | Status: AC
  Administered 2019-07-08: 25 mg via ORAL
  Filled 2019-07-08: qty 1

## 2019-07-08 MED ORDER — GADOBUTROL 1 MMOL/ML IV SOLN
10.0000 mL | Freq: Once | INTRAVENOUS | Status: AC | PRN
  Administered 2019-07-08: 14:00:00 10 mL via INTRAVENOUS

## 2019-07-08 MED ORDER — SODIUM CHLORIDE 0.9 % IV BOLUS
1000.0000 mL | Freq: Once | INTRAVENOUS | Status: AC
  Administered 2019-07-08: 1000 mL via INTRAVENOUS

## 2019-07-08 NOTE — Discharge Instructions (Signed)
If you develop a headache, fever, neck stiffness, vomiting, blurry or double vision, weakness or numbness in your arms or legs, trouble speaking, trouble walking, intractable dizziness or any other new/concerning symptoms then return to the ER for evaluation.

## 2019-07-08 NOTE — ED Triage Notes (Signed)
Arrives via EMS from home, C/C dizziness x2 weeks. Hx of cranial radiation, called MD for dizziness, given a 3 week course of steroids

## 2019-07-08 NOTE — Progress Notes (Signed)
error 

## 2019-07-08 NOTE — Telephone Encounter (Signed)
Caitlin Cohen called stating she is very dizzy and is going to the ED.  She is cancelling her appts.  Dr. Burr Medico notified

## 2019-07-08 NOTE — ED Provider Notes (Signed)
[facility] DEPT Provider Note   CSN: [id number] Arrival date & time: 07/08/19  0940     History Chief Complaint  Patient presents with  . Dizziness    Caitlin Cohen is a 54 y.o. female.  HPI 54 year old female presents with dizziness.  In January she was diagnosed with brain metastasis.  Had surgery and then radiation.  For the last week or so she has been dizzy with standing.  Feels like things are moving but also feels like she is going to pass out.  No vision changes.  Has chronic numbness that is how they found out about the brain mets but no new numbness or weakness.  No chest pain.  Placed on steroids but has had no relief.  Is due for an MRI in 5 days.   Past Medical History:  Diagnosis Date  . Anemia   . Anxiety   . Cancer ([place]) dx'd 06/2017   breast cancer right  . Depression   . Diabetes mellitus without complication ([place])   . GERD (gastroesophageal reflux disease)   . Headache   . History of radiation therapy 11/10/17- 12/08/17   right breast, 40.05 Gy in 15 fractions. Right breast boost 10 Gy in 5 fractions.   . Metastatic cancer to bone ([place]) dx'd 09/2018   pelvis, spine and ribs  . Personal history of chemotherapy   . Personal history of radiation therapy     Patient Active Problem List   Diagnosis Date Noted  . Brain metastasis ([place]) 06/02/2019  . Status post craniotomy 05/17/2019  . Intracranial tumor ([place]) 05/17/2019  . Brain mass 05/04/2019  . Bone metastases ([place]) 11/30/2018  . S/P kyphoplasty 11/12/2018  . Pathologic compression fracture of lumbar vertebra ([place]) 11/12/2018  . DM (diabetes mellitus), type 2 ([place]) 09/28/2017  . Febrile neutropenia ([place]) 09/26/2017  . Sepsis ([place]) 09/26/2017  . Diarrhea 09/26/2017  . Port-A-Cath in place 08/29/2017  . Malignant neoplasm of upper-inner quadrant of right breast in female, estrogen receptor positive ([place]) 07/01/2017  . Mood disorder ([place]) 07/05/2013    Class: Acute     Past Surgical History:  Procedure Laterality Date  . APPLICATION OF CRANIAL NAVIGATION N/A 05/17/2019   Procedure: APPLICATION OF CRANIAL NAVIGATION;  Surgeon: Judith Part, MD;  Location: [facility];  Service: Neurosurgery;  Laterality: N/A;  . CERVICAL CONE BIOPSY    . CHOLECYSTECTOMY    . CRANIOTOMY N/A 05/17/2019   Procedure: Suboccipital craniotomy for tumor resection with brainlab;  Surgeon: Judith Part, MD;  Location: [facility];  Service: Neurosurgery;  Laterality: N/A;  posterior/suboccipital  . IR RADIOLOGIST EVAL & MGMT  11/03/2018  . KYPHOPLASTY N/A 11/12/2018   Procedure: LUMBAR TWO  BIOPSY AND LUMBAR TWO KYPHOPLASTY;  Surgeon: Melina Schools, MD;  Location: [facility];  Service: Orthopedics;  Laterality: N/A;  90 mins  . MASTECTOMY WITH RADIOACTIVE SEED GUIDED EXCISION AND AXILLARY SENTINEL LYMPH NODE BIOPSY Right 07/08/2017   Procedure: RIGHT LUMPTECTOMY WITH RADIOACTIVE SEED GUIDED EXCISION AND RIGHT AXILLARY SENTINEL LYMPH NODE BIOPSY ERAS PATHWAY;  Surgeon: [name], MD;  Location: [facility];  Service: General;  Laterality: Right;  PEC BLOCK  . OPEN REDUCTION INTERNAL FIXATION (ORIF) DISTAL RADIAL FRACTURE  03/17/2012   Procedure: OPEN REDUCTION INTERNAL FIXATION (ORIF) DISTAL RADIAL FRACTURE;  Surgeon: Roseanne Kaufman, MD;  Location: [facility];  Service: Orthopedics;  Laterality: Right;  Pre-operative a supra-clavical block right arm in addition to general anesthesia  . PORT-A-CATH REMOVAL Right  02/02/2018   Procedure: REMOVAL PORT-A-CATH;  Surgeon: [name], MD;  Location: [facility];  Service: General;  Laterality: Right;  . PORTACATH PLACEMENT N/A 07/30/2017   Procedure: INSERTION PORT-A-CATH WITH [place];  Surgeon: [name], MD;  Location: [facility];  Service: General;  Laterality: N/A;  . TUBAL LIGATION       OB History    Gravida  3   Para      Term      Preterm      AB      Living  3     SAB      TAB      Ectopic       Multiple      Live Births  3           Family History  Problem Relation Age of Onset  . Hypertension Mother   . Diabetes Father   . Hypertension Father     Social History   Tobacco Use  . Smoking status: Never Smoker  . Smokeless tobacco: Never Used  Substance Use Topics  . Alcohol use: Yes    Comment: occassionally  . Drug use: No    Home Medications Prior to Admission medications   Medication Sig Start Date End Date Taking? Authorizing Provider  acetaminophen (TYLENOL) 325 MG tablet Take 650 mg by mouth every 6 (six) hours as needed for mild pain.   Yes [provider]  dexamethasone (DECADRON) 4 MG tablet Take 1 tablet (4 mg total) by mouth 3 (three) times daily. On 3/24, start taking 1 tablet twice daily. On 4/3, start taking 1 tablet daily. On 4/16, start taking half a tablet daily, until you run out. Take with food. Patient taking differently: Take 4 mg by mouth See admin instructions. 29m twice daily as of 07/07/2019 for 7 days, then 427mdaily until finished 06/30/19  Yes SqEppie GibsonMD  gabapentin (NEURONTIN) 300 MG capsule Take 1-2 capsules (300-600 mg total) by mouth See admin instructions. Take 300 mg by mouth in the morning and afternoon and 600 mg at bedtime 06/28/19  Yes BuAlla FeelingNP  ibuprofen (ADVIL) 200 MG tablet Take 400-600 mg by mouth every 6 (six) hours as needed for headache or mild pain.   Yes [provider]  letrozole (FEGadsden2.5 MG tablet TAKE 1 TABLET BY MOUTH EVERY DAY Patient taking differently: Take 2.5 mg by mouth daily.  04/30/19  Yes FeTruitt MerleMD  metFORMIN (GLUCOPHAGE) 500 MG tablet TAKE 1 TABLET (500 MG TOTAL) BY MOUTH 2 (TWO) TIMES DAILY WITH A MEAL. 05/14/19  Yes FeTruitt MerleMD  morphine (MS CONTIN) 15 MG 12 hr tablet Take 1 tablet (15 mg total) by mouth at bedtime. 06/22/19  Yes BuAlla FeelingNP  ondansetron (ZOFRAN) 8 MG tablet TAKE 1 TABLET (8 MG TOTAL) BY MOUTH EVERY 8 (EIGHT) HOURS AS NEEDED FOR NAUSEA OR  VOMITING. 04/26/19  Yes FeTruitt MerleMD  oxyCODONE (OXY IR/ROXICODONE) 5 MG immediate release tablet Take 1 tablet (5 mg total) by mouth every 8 (eight) hours as needed for severe pain. 07/06/19  Yes BuAlla FeelingNP  palbociclib (IBRANCE) 125 MG tablet TAKE 1 TABLET (125 MG TOTAL) BY MOUTH DAILY. TAKE FOR 21 DAYS ON, 7 DAYS OFF, REPEAT EVERY 28 DAYS. 06/10/19  Yes FeTruitt MerleMD  potassium chloride SA (KLOR-CON M20) 20 MEQ tablet Take 1 tablet (20 mEq total) by mouth 2 (two) times daily. 06/22/19  Yes BuKalman Shan  Wilhemina Cash, NP  prochlorperazine (COMPAZINE) 10 MG tablet Take 1 tablet (10 mg total) by mouth every 6 (six) hours as needed for nausea or vomiting. 05/21/19  Yes Truitt Merle, MD  blood glucose meter kit and supplies KIT Dispense based on patient and insurance preference. Use up to four times daily as directed. (FOR ICD-9 250.00, 250.01). 02/19/19   Truitt Merle, MD  meclizine (ANTIVERT) 25 MG tablet Take 1 tablet (25 mg total) by mouth 3 (three) times daily as needed for dizziness. 07/08/19   Sherwood Gambler, MD  polyethylene glycol (MIRALAX) 17 g packet Take 17 g by mouth daily as needed. Patient not taking: Reported on 07/08/2019 10/06/18   Robyn Haber, MD    Allergies    Patient has no known allergies.  Review of Systems   Review of Systems  Constitutional: Negative for fever.  Eyes: Negative for visual disturbance.  Respiratory: Negative for shortness of breath.   Cardiovascular: Negative for chest pain.  Neurological: Positive for dizziness and numbness. Negative for weakness and headaches.  All other systems reviewed and are negative.   Physical Exam Updated Vital Signs BP (!) 141/88   Pulse 75   Temp 98.3 F (36.8 C) (Oral)   Resp 16   SpO2 95%   Physical Exam Vitals and nursing note reviewed.  Constitutional:      General: She is not in acute distress.    Appearance: She is well-developed. She is not ill-appearing or diaphoretic.  HENT:     Head: Normocephalic and atraumatic.      Right Ear: External ear normal.     Left Ear: External ear normal.     Nose: Nose normal.  Eyes:     General:        Right eye: No discharge.        Left eye: No discharge.     Extraocular Movements:     Right eye: Nystagmus present.     Left eye: Nystagmus present.     Pupils: Pupils are equal, round, and reactive to light.  Cardiovascular:     Rate and Rhythm: Normal rate and regular rhythm.     Heart sounds: Normal heart sounds.  Pulmonary:     Effort: Pulmonary effort is normal.     Breath sounds: Normal breath sounds.  Abdominal:     Palpations: Abdomen is soft.     Tenderness: There is no abdominal tenderness.  Skin:    General: Skin is warm and dry.  Neurological:     Mental Status: She is alert.     Comments: CN 3-12 grossly intact. 5/5 strength in all 4 extremities. Grossly normal sensation. Normal finger to nose.   Psychiatric:        Mood and Affect: Mood is not anxious.     ED Results / Procedures / Treatments   Labs (all labs ordered are listed, but only abnormal results are displayed) Labs Reviewed  COMPREHENSIVE METABOLIC PANEL - Abnormal; Notable for the following components:      Result Value   Glucose, Bld 113 (*)    BUN 28 (*)    All other components within normal limits  CBC WITH DIFFERENTIAL/PLATELET - Abnormal; Notable for the following components:   Lymphs Abs 0.6 (*)    All other components within normal limits    EKG EKG Interpretation  Date/Time:  Thursday July 08 2019 09:57:39 EDT Ventricular Rate:  76 PR Interval:    QRS Duration: 86 QT Interval:  391 QTC  Calculation: 440 R Axis:   17 Text Interpretation: Sinus rhythm Probable left atrial enlargement Anterior infarct, old no acute ST/T changes no significant change since 2020 Confirmed by Sherwood Gambler [phone]) on 07/08/2019 10:00:17 AM   Radiology MR Brain W and Wo Contrast  Result Date: 07/08/2019 CLINICAL DATA:  Dizziness, metastatic breast cancer post resection EXAM:  MRI HEAD WITHOUT AND WITH CONTRAST TECHNIQUE: Multiplanar, multiecho pulse sequences of the brain and surrounding structures were obtained without and with intravenous contrast. CONTRAST:  38m GADAVIST GADOBUTROL 1 MMOL/ML IV SOLN COMPARISON:  05/31/2019 FINDINGS: Brain: Postoperative changes are again present at the right dorsal medulla near the cervicomedullary junction with resection cavity and chronic blood products. There is no new nodular enhancement to suggest recurrent tumor. Enhancement in the adjacent cerebellum has essentially resolved and was likely secondary to subacute ischemia. There is no acute infarction or intracranial hemorrhage. No hydrocephalus. Vascular: Major vessel flow voids at the skull base are preserved. Skull and upper cervical spine: Suboccipital craniectomy with cranioplasty. Suspected partial resection of the posterior arch of C1. There is a postoperative collection about the cranioplasty extending into the midline neck best seen on sagittal T1 sequence slightly increased in size. Small focus of T1 hypointensity the ventral aspect of C2 vertebral body could reflect a metastasis. Previous abnormal signal at C4 is not well evaluated due to artifact. Sinuses/Orbits: Left maxillary sinus retention cyst. Orbits are unremarkable. Other: Sella is unremarkable.  Trace mastoid fluid opacification. IMPRESSION: Expected evolution of postoperative changes near the cervicomedullary junction without evidence of recurrent tumor. Small increase in size of postoperative fluid collection extending from the cranioplasty into the posterior neck. Electronically Signed   By: PMacy MisM.D.   On: 07/08/2019 14:07    Procedures Procedures (including critical care time)  Medications Ordered in ED Medications  sodium chloride 0.9 % bolus 1,000 mL (0 mLs Intravenous Stopped 07/08/19 1154)  meclizine (ANTIVERT) tablet 25 mg (25 mg Oral Given 07/08/19 1011)  gadobutrol (GADAVIST) 1 MMOL/ML injection  10 mL (10 mLs Intravenous Contrast Given 07/08/19 1348)    ED Course  I have reviewed the triage vital signs and the nursing notes.  Pertinent labs & imaging results that were available during my care of the patient were reviewed by me and considered in my medical decision making (see chart for details).    MDM Rules/Calculators/A&P                      Given her medical history, MRI brain with and without was obtained and shows expected evolution.  No significant findings.  With Antivert, she feels like her symptoms are gone and she is able to ambulate without difficulty.  Likely has vertigo, possibly a side effect of her treatments and surgery.  Will discharge with Antivert and have her follow-up with oncology.  She was given some fluids. No signs/symptoms of infection  Final Clinical Impression(s) / ED Diagnoses Final diagnoses:  Vertigo    Rx / DC Orders ED Discharge Orders         Ordered    meclizine (ANTIVERT) 25 MG tablet  3 times daily PRN     07/08/19 1442           GSherwood Gambler MD 07/08/19 1505

## 2019-07-09 ENCOUNTER — Ambulatory Visit: Payer: Self-pay | Admitting: Radiation Oncology

## 2019-07-09 ENCOUNTER — Other Ambulatory Visit: Payer: Self-pay | Admitting: Radiation Therapy

## 2019-07-09 DIAGNOSIS — C7931 Secondary malignant neoplasm of brain: Secondary | ICD-10-CM

## 2019-07-12 ENCOUNTER — Telehealth: Payer: Self-pay | Admitting: Hematology

## 2019-07-12 ENCOUNTER — Telehealth: Payer: Self-pay

## 2019-07-12 NOTE — Telephone Encounter (Signed)
Message to scheduling for lab ov and injection for this week

## 2019-07-12 NOTE — Telephone Encounter (Signed)
Scheduled appt per 3/29 sch message - pt aware of appt date and time   

## 2019-07-13 ENCOUNTER — Other Ambulatory Visit: Payer: Medicaid Other

## 2019-07-14 ENCOUNTER — Inpatient Hospital Stay
Admission: RE | Admit: 2019-07-14 | Discharge: 2019-07-14 | Disposition: A | Discharge status: Home / Self Care | Payer: Medicaid Other | Source: Ambulatory Visit | Attending: Radiation Oncology | Admitting: Radiation Oncology

## 2019-07-15 ENCOUNTER — Inpatient Hospital Stay: Payer: Medicaid Other

## 2019-07-15 ENCOUNTER — Inpatient Hospital Stay: Payer: Medicaid Other | Admitting: Hematology

## 2019-07-15 ENCOUNTER — Other Ambulatory Visit: Payer: Self-pay

## 2019-07-15 ENCOUNTER — Inpatient Hospital Stay: Payer: Medicaid Other | Attending: Hematology

## 2019-07-15 VITALS — BP 123/83 | HR 96 | Temp 98.0°F | Resp 18

## 2019-07-15 DIAGNOSIS — C7931 Secondary malignant neoplasm of brain: Secondary | ICD-10-CM | POA: Insufficient documentation

## 2019-07-15 DIAGNOSIS — C7951 Secondary malignant neoplasm of bone: Secondary | ICD-10-CM | POA: Insufficient documentation

## 2019-07-15 DIAGNOSIS — Z79899 Other long term (current) drug therapy: Secondary | ICD-10-CM | POA: Diagnosis not present

## 2019-07-15 DIAGNOSIS — Z923 Personal history of irradiation: Secondary | ICD-10-CM | POA: Insufficient documentation

## 2019-07-15 DIAGNOSIS — C50211 Malignant neoplasm of upper-inner quadrant of right female breast: Secondary | ICD-10-CM | POA: Insufficient documentation

## 2019-07-15 DIAGNOSIS — R42 Dizziness and giddiness: Secondary | ICD-10-CM | POA: Insufficient documentation

## 2019-07-15 DIAGNOSIS — Z170 Estrogen receptor positive status [ER+]: Secondary | ICD-10-CM | POA: Insufficient documentation

## 2019-07-15 DIAGNOSIS — Z95828 Presence of other vascular implants and grafts: Secondary | ICD-10-CM

## 2019-07-15 DIAGNOSIS — Z5111 Encounter for antineoplastic chemotherapy: Secondary | ICD-10-CM | POA: Insufficient documentation

## 2019-07-15 DIAGNOSIS — E119 Type 2 diabetes mellitus without complications: Secondary | ICD-10-CM | POA: Insufficient documentation

## 2019-07-15 DIAGNOSIS — Z7984 Long term (current) use of oral hypoglycemic drugs: Secondary | ICD-10-CM | POA: Insufficient documentation

## 2019-07-15 LAB — CMP (CANCER CENTER ONLY)
ALT: 15 U/L (ref 0–44)
AST: 12 U/L — ABNORMAL LOW (ref 15–41)
Albumin: 3.2 g/dL — ABNORMAL LOW (ref 3.5–5.0)
Alkaline Phosphatase: 53 U/L (ref 38–126)
Anion gap: 11 (ref 5–15)
BUN: 33 mg/dL — ABNORMAL HIGH (ref 6–20)
CO2: 21 mmol/L — ABNORMAL LOW (ref 22–32)
Calcium: 8.2 mg/dL — ABNORMAL LOW (ref 8.9–10.3)
Chloride: 107 mmol/L (ref 98–111)
Creatinine: 0.96 mg/dL (ref 0.44–1.00)
GFR, Est AFR Am: >60 mL/min (ref >60)
GFR, Estimated: >60 mL/min (ref >60)
Glucose, Bld: 190 mg/dL — ABNORMAL HIGH (ref 70–99)
Potassium: 4.6 mmol/L (ref 3.5–5.1)
Sodium: 139 mmol/L (ref 135–145)
Total Bilirubin: 0.4 mg/dL (ref 0.3–1.2)
Total Protein: 6.2 g/dL — ABNORMAL LOW (ref 6.5–8.1)

## 2019-07-15 LAB — CBC WITH DIFFERENTIAL (CANCER CENTER ONLY)
Abs Immature Granulocytes: 0.03 10*3/uL (ref 0.00–0.07)
Basophils Absolute: 0 10*3/uL (ref 0.0–0.1)
Basophils Relative: 0 %
Eosinophils Absolute: 0 10*3/uL (ref 0.0–0.5)
Eosinophils Relative: 0 %
HCT: 37.1 % (ref 36.0–46.0)
Hemoglobin: 12.4 g/dL (ref 12.0–15.0)
Immature Granulocytes: 1 %
Lymphocytes Relative: 8 %
Lymphs Abs: 0.5 10*3/uL — ABNORMAL LOW (ref 0.7–4.0)
MCH: 31.6 pg (ref 26.0–34.0)
MCHC: 33.4 g/dL (ref 30.0–36.0)
MCV: 94.4 fL (ref 80.0–100.0)
Monocytes Absolute: 0.1 10*3/uL (ref 0.1–1.0)
Monocytes Relative: 2 %
Neutro Abs: 5.4 10*3/uL (ref 1.7–7.7)
Neutrophils Relative %: 89 %
Platelet Count: 133 10*3/uL — ABNORMAL LOW (ref 150–400)
RBC: 3.93 MIL/uL (ref 3.87–5.11)
RDW: 14.7 % (ref 11.5–15.5)
WBC Count: 6 10*3/uL (ref 4.0–10.5)
nRBC: 0 % (ref 0.0–0.2)

## 2019-07-15 MED ORDER — DENOSUMAB 120 MG/1.7ML ~~LOC~~ SOLN
SUBCUTANEOUS | Status: AC
  Filled 2019-07-15: qty 1.7

## 2019-07-15 MED ORDER — GOSERELIN ACETATE 3.6 MG ~~LOC~~ IMPL
DRUG_IMPLANT | SUBCUTANEOUS | Status: AC
  Filled 2019-07-15: qty 3.6

## 2019-07-15 MED ORDER — GOSERELIN ACETATE 3.6 MG ~~LOC~~ IMPL
3.6000 mg | DRUG_IMPLANT | Freq: Once | SUBCUTANEOUS | Status: AC
  Administered 2019-07-15: 3.6 mg via SUBCUTANEOUS

## 2019-07-15 MED ORDER — DENOSUMAB 120 MG/1.7ML ~~LOC~~ SOLN
120.0000 mg | Freq: Once | SUBCUTANEOUS | Status: AC
  Administered 2019-07-15: 120 mg via SUBCUTANEOUS

## 2019-07-15 NOTE — Patient Instructions (Signed)
Denosumab injection What is this medicine? DENOSUMAB (den oh sue mab) slows bone breakdown. Prolia is used to treat osteoporosis in women after menopause and in men, and in people who are taking corticosteroids for 6 months or more. Xgeva is used to treat a high calcium level due to cancer and to prevent bone fractures and other bone problems caused by multiple myeloma or cancer bone metastases. Xgeva is also used to treat giant cell tumor of the bone. This medicine may be used for other purposes; ask your health care provider or pharmacist if you have questions. COMMON BRAND NAME(S): Prolia, XGEVA What should I tell my health care provider before I take this medicine? They need to know if you have any of these conditions:  dental disease  having surgery or tooth extraction  infection  kidney disease  low levels of calcium or Vitamin D in the blood  malnutrition  on hemodialysis  skin conditions or sensitivity  thyroid or parathyroid disease  an unusual reaction to denosumab, other medicines, foods, dyes, or preservatives  pregnant or trying to get pregnant  breast-feeding How should I use this medicine? This medicine is for injection under the skin. It is given by a health care professional in a hospital or clinic setting. A special MedGuide will be given to you before each treatment. Be sure to read this information carefully each time. For Prolia, talk to your pediatrician regarding the use of this medicine in children. Special care may be needed. For Xgeva, talk to your pediatrician regarding the use of this medicine in children. While this drug may be prescribed for children as young as 13 years for selected conditions, precautions do apply. Overdosage: If you think you have taken too much of this medicine contact a poison control center or emergency room at once. NOTE: This medicine is only for you. Do not share this medicine with others. What if I miss a dose? It is  important not to miss your dose. Call your doctor or health care professional if you are unable to keep an appointment. What may interact with this medicine? Do not take this medicine with any of the following medications:  other medicines containing denosumab This medicine may also interact with the following medications:  medicines that lower your chance of fighting infection  steroid medicines like prednisone or cortisone This list may not describe all possible interactions. Give your health care provider a list of all the medicines, herbs, non-prescription drugs, or dietary supplements you use. Also tell them if you smoke, drink alcohol, or use illegal drugs. Some items may interact with your medicine. What should I watch for while using this medicine? Visit your doctor or health care professional for regular checks on your progress. Your doctor or health care professional may order blood tests and other tests to see how you are doing. Call your doctor or health care professional for advice if you get a fever, chills or sore throat, or other symptoms of a cold or flu. Do not treat yourself. This drug may decrease your body's ability to fight infection. Try to avoid being around people who are sick. You should make sure you get enough calcium and vitamin D while you are taking this medicine, unless your doctor tells you not to. Discuss the foods you eat and the vitamins you take with your health care professional. See your dentist regularly. Brush and floss your teeth as directed. Before you have any dental work done, tell your dentist you are   receiving this medicine. Do not become pregnant while taking this medicine or for 5 months after stopping it. Talk with your doctor or health care professional about your birth control options while taking this medicine. Women should inform their doctor if they wish to become pregnant or think they might be pregnant. There is a potential for serious side  effects to an unborn child. Talk to your health care professional or pharmacist for more information. What side effects may I notice from receiving this medicine? Side effects that you should report to your doctor or health care professional as soon as possible:  allergic reactions like skin rash, itching or hives, swelling of the face, lips, or tongue  bone pain  breathing problems  dizziness  jaw pain, especially after dental work  redness, blistering, peeling of the skin  signs and symptoms of infection like fever or chills; cough; sore throat; pain or trouble passing urine  signs of low calcium like fast heartbeat, muscle cramps or muscle pain; pain, tingling, numbness in the hands or feet; seizures  unusual bleeding or bruising  unusually weak or tired Side effects that usually do not require medical attention (report to your doctor or health care professional if they continue or are bothersome):  constipation  diarrhea  headache  joint pain  loss of appetite  muscle pain  runny nose  tiredness  upset stomach This list may not describe all possible side effects. Call your doctor for medical advice about side effects. You may report side effects to FDA at 1-800-FDA-1088. Where should I keep my medicine? This medicine is only given in a clinic, doctor's office, or other health care setting and will not be stored at home. NOTE: This sheet is a summary. It may not cover all possible information. If you have questions about this medicine, talk to your doctor, pharmacist, or health care provider.  2020 Elsevier/Gold Standard (2017-08-08 16:10:44) Goserelin injection What is this medicine? GOSERELIN (GOE se rel in) is similar to a hormone found in the body. It lowers the amount of sex hormones that the body makes. Men will have lower testosterone levels and women will have lower estrogen levels while taking this medicine. In men, this medicine is used to treat prostate  cancer; the injection is either given once per month or once every 12 weeks. A once per month injection (only) is used to treat women with endometriosis, dysfunctional uterine bleeding, or advanced breast cancer. This medicine may be used for other purposes; ask your health care provider or pharmacist if you have questions. COMMON BRAND NAME(S): Zoladex What should I tell my health care provider before I take this medicine? They need to know if you have any of these conditions:  bone problems  diabetes  heart disease  history of irregular heartbeat  an unusual or allergic reaction to goserelin, other medicines, foods, dyes, or preservatives  pregnant or trying to get pregnant  breast-feeding How should I use this medicine? This medicine is for injection under the skin. It is given by a health care professional in a hospital or clinic setting. Talk to your pediatrician regarding the use of this medicine in children. Special care may be needed. Overdosage: If you think you have taken too much of this medicine contact a poison control center or emergency room at once. NOTE: This medicine is only for you. Do not share this medicine with others. What if I miss a dose? It is important not to miss your dose. Call your   doctor or health care professional if you are unable to keep an appointment. What may interact with this medicine? Do not take this medicine with any of the following medications:  cisapride  dronedarone  pimozide  thioridazine This medicine may also interact with the following medications:  other medicines that prolong the QT interval (an abnormal heart rhythm) This list may not describe all possible interactions. Give your health care provider a list of all the medicines, herbs, non-prescription drugs, or dietary supplements you use. Also tell them if you smoke, drink alcohol, or use illegal drugs. Some items may interact with your medicine. What should I watch for  while using this medicine? Visit your doctor or health care provider for regular checks on your progress. Your symptoms may appear to get worse during the first weeks of this therapy. Tell your doctor or healthcare provider if your symptoms do not start to get better or if they get worse after this time. Your bones may get weaker if you take this medicine for a long time. If you smoke or frequently drink alcohol you may increase your risk of bone loss. A family history of osteoporosis, chronic use of drugs for seizures (convulsions), or corticosteroids can also increase your risk of bone loss. Talk to your doctor about how to keep your bones strong. This medicine should stop regular monthly menstruation in women. Tell your doctor if you continue to menstruate. Women should not become pregnant while taking this medicine or for 12 weeks after stopping this medicine. Women should inform their doctor if they wish to become pregnant or think they might be pregnant. There is a potential for serious side effects to an unborn child. Talk to your health care professional or pharmacist for more information. Do not breast-feed an infant while taking this medicine. Men should inform their doctors if they wish to father a child. This medicine may lower sperm counts. Talk to your health care professional or pharmacist for more information. This medicine may increase blood sugar. Ask your healthcare provider if changes in diet or medicines are needed if you have diabetes. What side effects may I notice from receiving this medicine? Side effects that you should report to your doctor or health care professional as soon as possible:  allergic reactions like skin rash, itching or hives, swelling of the face, lips, or tongue  bone pain  breathing problems  changes in vision  chest pain  feeling faint or lightheaded, falls  fever, chills  pain, swelling, warmth in the leg  pain, tingling, numbness in the hands  or feet  signs and symptoms of high blood sugar such as being more thirsty or hungry or having to urinate more than normal. You may also feel very tired or have blurry vision  signs and symptoms of low blood pressure like dizziness; feeling faint or lightheaded, falls; unusually weak or tired  stomach pain  swelling of the ankles, feet, hands  trouble passing urine or change in the amount of urine  unusually high or low blood pressure  unusually weak or tired Side effects that usually do not require medical attention (report to your doctor or health care professional if they continue or are bothersome):  change in sex drive or performance  changes in breast size in both males and females  changes in emotions or moods  headache  hot flashes  irritation at site where injected  loss of appetite  skin problems like acne, dry skin  vaginal dryness This list  may not describe all possible side effects. Call your doctor for medical advice about side effects. You may report side effects to FDA at 1-800-FDA-1088. Where should I keep my medicine? This drug is given in a hospital or clinic and will not be stored at home. NOTE: This sheet is a summary. It may not cover all possible information. If you have questions about this medicine, talk to your doctor, pharmacist, or health care provider.  2020 Elsevier/Gold Standard (2018-07-20 14:05:56)  

## 2019-07-15 NOTE — Progress Notes (Signed)
Per MD ok to give xgeva with current Ca.

## 2019-07-16 ENCOUNTER — Inpatient Hospital Stay (HOSPITAL_BASED_OUTPATIENT_CLINIC_OR_DEPARTMENT_OTHER): Payer: Medicaid Other | Admitting: Internal Medicine

## 2019-07-16 ENCOUNTER — Other Ambulatory Visit: Payer: Self-pay

## 2019-07-16 VITALS — BP 129/92 | HR 95 | Temp 98.0°F | Resp 18 | Ht 67.0 in | Wt 263.4 lb

## 2019-07-16 DIAGNOSIS — R42 Dizziness and giddiness: Secondary | ICD-10-CM | POA: Diagnosis not present

## 2019-07-16 DIAGNOSIS — R270 Ataxia, unspecified: Secondary | ICD-10-CM

## 2019-07-16 DIAGNOSIS — C7931 Secondary malignant neoplasm of brain: Secondary | ICD-10-CM

## 2019-07-16 DIAGNOSIS — Z5111 Encounter for antineoplastic chemotherapy: Secondary | ICD-10-CM | POA: Diagnosis not present

## 2019-07-16 LAB — CANCER ANTIGEN 27.29: CA 27.29: 58 U/mL — ABNORMAL HIGH (ref 0.0–38.6)

## 2019-07-16 NOTE — Progress Notes (Signed)
[facility] at [facility] [address] [place], [place] [address] [phone]   Interval Evaluation  Date of Service: 07/16/19 Patient Name: Caitlin Cohen Patient MRN: [id number] Patient DOB: [birth year] Provider: Ventura Sellers, MD  Identifying Statement:  Caitlin Cohen is a 54 y.o. female with brain metastasis   Primary Cancer:  Oncologic History: Oncology History Overview Note  Cancer Staging Malignant neoplasm of upper-inner quadrant of right breast in female, estrogen receptor positive ([place]) Staging form: Breast, AJCC 8th Edition - Clinical stage from 06/26/2017: Stage IIA (cT2, cN0, cM0, G3, ER: Positive, PR: Positive, HER2: Negative) - Signed by Truitt Merle, MD on 07/01/2017 - Pathologic stage from 07/08/2017: Stage IB (pT2, pN0, cM0, G3, ER+, PR+, HER2-, Oncotype DX score: 29) - Signed by [name], NP on 07/25/2017     Malignant neoplasm of upper-inner quadrant of right breast in female, estrogen receptor positive ([place])  06/23/2017 Mammogram   IMPRESSION: 1. There is a highly suspicious mass measuring 3.5 cm mammographically in the right breast at the palpable site identified by the patient. 2. There is 1 suspicious lymph node with cortex measuring up to 6 mm. 3.  No mammographic evidence of malignancy in the left breast.   06/26/2017 Initial Biopsy   Diagnosis 06/26/17 1. Breast, right, needle core biopsy, upper inner quadrant, 12:30 o'clock, 7cm from nipple - INVASIVE DUCTAL CARCINOMA - SEE COMMENT 2. Lymph node, needle/core biopsy, right axillary (level 2 node) - NO CARCINOMA IDENTIFIED IN ONE LYMPH NODE (0/1)   06/26/2017 Receptors her2   Prognostic indicators significant for: ER, 70% positive and PR, 70% positive, both with strong staining intensity. Proliferation marker Ki67 at 60%. HER2 negative.   07/01/2017 Initial Diagnosis   Malignant neoplasm of upper-inner quadrant of right breast in female, estrogen receptor  positive ([place])   07/08/2017 Pathology Results   Diagnosis 1. Breast, lumpectomy, Right - INVASIVE DUCTAL CARCINOMA, NOTTINGHAM GRADE 3 OF 3, 3.5 CM - MARGINS UNINVOLVED BY CARCINOMA (0.1 CM, SUPERIOR MARGIN) - PREVIOUS BIOPSY SITE CHANGES - SEE ONCOLOGY TABLE BELOW 2. Soft tissue, biopsy, Axillary - BENIGN FIBROADIPOSE TISSUE - NO MALIGNANCY IDENTIFIED 3. Lymph node, sentinel, biopsy, Right axillary - NO CARCINOMA IDENTIFIED IN ONE LYMPH NODE (0/1) 4. Lymph node, sentinel, biopsy, Right axillary - NO CARCINOMA IDENTIFIED IN ONE LYMPH NODE (0/1) - PREVIOUS BIOPSY SITE CHANGES - SEE COMMENT 5. Breast, excision, Right superior margin - BENIGN BREAST TISSUE - NO RESIDUAL CARCINOMA IDENTIFIED 6. Breast, excision, Right inferior margin - BENIGN BREAST TISSUE - NO RESIDUAL CARCINOMA IDENTIFIED   07/08/2017 Cancer Staging   Staging form: Breast, AJCC 8th Edition - Pathologic stage from 07/08/2017: Stage IB (pT2, pN0, cM0, G3, ER+, PR+, HER2-, Oncotype DX score: 29) - Signed by [name], NP on 07/25/2017   07/22/2017 Oncotype testing   Recurrence Score: 29 Distant Recurrence Risk at 9 years with Ai or Tamoxifen alone: 18% Absolute Chemotherapy Benefit: > 15 %   08/08/2017 -  Chemotherapy   Adjuvant chemo Taxotere/Cytoxanq3 weeks x4 cycles with Udenyca -Granix added from cycle 2. Discontinued Udenyca following cycle 2.    11/10/2017 - 12/08/2017 Radiation Therapy   Radiation treatment dates:   11/10/2017 to 12/08/2017  Site/dose:    1. The Right breast was treated to 40.05 Gy in 15 fractions of 2.67 Gy. 2. The Right breast was boosted to 10 Gy in 5 fractions of 2 Gy.  Beams/energy:    1. 3D// 10X 2. Isodose plan  w/photons // 10X, 6X  Narrative: The patient tolerated radiation treatment relatively well.   She developed moderate hyperpigmentation to the right breast with treatment. She denies any pain or fatigue. She continues to use radiaplex twice daily as directed.    12/2017  - 11/2018 Anti-estrogen oral therapy   started Tamoxifen 56m daily in 12/2017. Stopped in 11/2018 due to cancer recurrance. Restarted in early 12/2018 and switched to Letrozole on 01/14/19.    08/11/2018 Survivorship   SCP virtual visit per LCira Rue NP    10/27/2018 Imaging   CT CAP W Contrast 10/27/18  IMPRESSION: 1. Multifocal lytic bone metastases are identified involving the left ribs, L2 vertebra, and right iliac wing. There is of pathologic fracture involving the L2 vertebra. 2. Single small, nonspecific pulmonary nodule in the right upper lobe is noted. 3. Ill-defined low-density structure in right lobe of liver measures 1.3 cm and is indeterminate. This may be better characterized with contrast enhanced liver MRI or PET-CT.   10/28/2018 Imaging   Whole Body Bone Scan 10/28/18  IMPRESSION: Sites of uptake in the left third and sixth ribs, right iliac crest and L2 vertebral body are compatible with sites of osseous metastatic disease with lesions visualized on CT from 1 day prior   Non-specific soft tissue uptake is seen at the site of prior right lumpectomy.   Likely urinary contamination in the soft tissues of the right pelvis, buttock and right flank.   11/12/2018 Relapse/Recurrence   Diagnosis 1. Bone, biopsy, Lumbar 2, Left Pedicle - BONE WITH MARROW FIBROSIS. - NO MALIGNANCY IDENTIFIED. 2. Bone, biopsy, Lumbar 2, Right Pedicle - METASTATIC CARCINOMA, SEE COMMENT.  The tumor cells are NEGATIVE for Her2 (0). Estrogen Receptor: 0%, NEGATIVE Progesterone Receptor: 0%, NEGATIVE   12/02/2018 - 12/18/2018 Radiation Therapy   L2 palliative RT (12/02/18-12/09/18) and will start palliative RT to right iliac lesion 12/10/18 and completed on 12/18/18 with Dr. SIsidore Moos   12/03/2018 Pathology Results   Diagnosis 12/03/18 Bone, biopsy, iliac crest, right - METASTATIC CARCINOMA. Results: IMMUNOHISTOCHEMICAL AND MORPHOMETRIC ANALYSIS PERFORMED MANUALLY The tumor cells are NEGATIVE for  Her2 (1+). Estrogen Receptor: 80%, POSITIVE, STRONG STAINING INTENSITY Progesterone Receptor: 20%, POSITIVE, STRONG STAINING INTENSITY   12/16/2018 -  Chemotherapy   Zometa injection q310monthstarting 12/16/18. Due to flu like symptoms this was switched to monthly Xgeva with second dose on 03/17/19    12/28/2018 -  Chemotherapy   Ibrance 3 weeks on/1 week off starting 12/28/18 Letrozole once daily starting 01/14/19 with monthly Zoledex injections starting 12/16/18.    02/16/2019 Imaging   CT CAP W Contrast  IMPRESSION: Chest.   1. New peripheral consolidation in the LEFT upper lobe with air bronchograms adjacent to lytic lesions within the third and sixth rib is favored radiation change. Recommend attention on follow-up. 2. No new or progressive breast cancer in thorax.   Abdomen / Pelvis.   1. No evidence of metastatic disease in the soft tissues of the abdomen pelvis. 2. Increase in size of lytic metastasis in the RIGHT iliac CREST. 3. Interval L2 kyphoplasty at site a pathologic fracture.     02/16/2019 Imaging   Whole Body bone scan  IMPRESSION: 1. Stable to improved uptake associated with previous left anterior rib lesions. 2. Increased uptake within known lytic metastasis involving the L2 vertebra and anterior right iliac crest. 3. No new sites of disease.   04/29/2019 Imaging   MRI brain  IMPRESSION: 1.9 x 1.2 x 0.8 cm mass of the inferior fourth  ventricle, extending at the foramen of Luschka on the right. Findings most worrisome for ependymoma. See above discussion.   These results will be called to the ordering clinician or representative by the Radiologist Assistant, and communication documented in the PACS or zVision Dashboard.   05/17/2019 Imaging   MRI brain  IMPRESSION: 1. Status post fourth ventricle lesion resection. Small amount of residual contrast enhancement within the dorsal parenchyma at the level of the cervicomedullary junction, favored to be  postsurgical. This will serve as a baseline for future studies. 2. Small focus of acute ischemia within the medial right cerebellar hemisphere. 3. Pneumocephalus.   05/17/2019 Surgery   Suboccipital craniotomy for tumor resection with brainlab by Dr. Zada Finders. Only 0.2cm of tumor was removed and biopsied. He was not able to fully resect it.     05/17/2019 Pathology Results   FINAL MICROSCOPIC DIAGNOSIS:   A. BRAIN, TUMOR, BIOPSY:  - Metastatic carcinoma, see comment.   COMMENT:   Immunohistochemistry is positive for cytokeratin 7 and GATA-3.  Cytokeratin 20 is negative. The findings are consistent with metastatic  breast carcinoma. Prognostic markers have been ordered.  ADDENDUM:   PROGNOSTIC INDICATOR RESULTS:  The tumor cells are EQUIVOCAL for Her2 (2+).   Estrogen Receptor: 95%, STRONG STAINING INTESITY  Progesterone Receptor: 20%, STRONG STAINING INTESITY  GROUP 5:   HER2 **NEGATIVE**    05/31/2019 Imaging   MRI brain  IMPRESSION: 1. Postsurgical changes from partial resection of a medullary lesion with persistent nodular contrast enhancement, not significantly changed from prior MRI. 2. Interval development of contrast enhancement in the anterior inferior aspect of the cerebellum, in the area of previously seen restricted diffusion, consistent with post ischemic changes, although concurrent tumor extension cannot be entirely excluded.   06/07/2019 Imaging   CT CAP W contrast  IMPRESSION: 1. Signs of pleural and parenchymal scarring in the left chest associated with radiotherapy with diminished consolidative change compared to the prior study. 2. Stable 4 mm perifissural nodule along the major fissure of the right chest. 3. Increasing sclerosis of left ribs as described may reflect response to therapy with healing. 4. New lytic focus with sclerotic margin in the right iliac bone posteriorly measuring 1.8 cm. New small focus of sclerosis in the posterior left  acetabulum measuring approximately 11 mm. These areas were not visible on the prior study, perhaps reflecting interval healing, related to ongoing therapy given findings of increased sclerosis and other areas though mixed response is considered. Close attention on follow-up is suggested. 5. Redemonstration of destructive process in the right iliac bone anteriorly along the right iliac CREST, greatest axial dimension approximately 4.1 cm as compared to 4.3 cm. May also reflect post treatment related improvement given further sclerosis, attention on follow-up. 6. New small focus of sclerosis along the lateral margin of the greater trochanter (image 93, no measurable lesion in this location). Attention on subsequent bone scan and follow-up evaluations. Is   06/07/2019 Imaging   Bone Scan whole body  IMPRESSION: 1. Stable areas of increased activity noted over the left anterior third and sixth ribs. Stable area of increased activity noted in L2.   2. Subtle new foci of increased activity noted over the lower thoracic spine. A new proximal right femoral focus of increased activity also noted. Findings suggest progressive metastatic disease.     06/07/2019 - 06/24/2019 Radiation Therapy   Brain Radiation by Dr. Isidore Moos      Interval History:  Cicily Bonano presents today for clinical follow up  and MRI review.  She describes more frequent and more severe dizziness and vertigo.  This is currently an "every day" problem, she is now relying on a walker for ambulation because of these symptoms.  Decadron given by Dr. Pearlie Oyster office has not helped at all, currently she is taking 81m twice per day.  Meclizine helps "a little". Denies double vision, facial weakness, facial numbness, dysarthria or dysphagia.  No seizures.  Continues with chemotherapy through Dr. FBurr Medicowithout issue.  H+P (05/04/19) Patient presents today to review recent abnormal findings on brain imaging.  She describes ~6 months  history of sensory changes involving right arm and leg.  This is described as "loss of sensation" type numbness, rather than pins and needles.  At times the right side of the face is involved as well, although it is primarily the arm/trunk/leg.  No issues with gait or balance aside from rare episodes of vertigo-type dizziness.  No slurred speech or difficulty swallowing.  Denies headaches or seizures.  Medications: Current Outpatient Medications on File Prior to Visit  Medication Sig Dispense Refill   acetaminophen (TYLENOL) 325 MG tablet Take 650 mg by mouth every 6 (six) hours as needed for mild pain.     blood glucose meter kit and supplies KIT Dispense based on patient and insurance preference. Use up to four times daily as directed. (FOR ICD-9 250.00, 250.01). 1 each 0   dexamethasone (DECADRON) 4 MG tablet Take 1 tablet (4 mg total) by mouth 3 (three) times daily. On 3/24, start taking 1 tablet twice daily. On 4/3, start taking 1 tablet daily. On 4/16, start taking half a tablet daily, until you run out. Take with food. (Patient taking differently: Take 4 mg by mouth See admin instructions. 452mtwice daily as of 07/07/2019 for 7 days, then 80m19maily until finished) 65 tablet 0   gabapentin (NEURONTIN) 300 MG capsule Take 1-2 capsules (300-600 mg total) by mouth See admin instructions. Take 300 mg by mouth in the morning and afternoon and 600 mg at bedtime 270 capsule 2   ibuprofen (ADVIL) 200 MG tablet Take 400-600 mg by mouth every 6 (six) hours as needed for headache or mild pain.     letrozole (FEMARA) 2.5 MG tablet TAKE 1 TABLET BY MOUTH EVERY DAY (Patient taking differently: Take 2.5 mg by mouth daily. ) 90 tablet 1   meclizine (ANTIVERT) 25 MG tablet Take 1 tablet (25 mg total) by mouth 3 (three) times daily as needed for dizziness. 15 tablet 0   metFORMIN (GLUCOPHAGE) 500 MG tablet TAKE 1 TABLET (500 MG TOTAL) BY MOUTH 2 (TWO) TIMES DAILY WITH A MEAL. 180 tablet 0   morphine (MS  CONTIN) 15 MG 12 hr tablet Take 1 tablet (15 mg total) by mouth at bedtime. 30 tablet 0   ondansetron (ZOFRAN) 8 MG tablet TAKE 1 TABLET (8 MG TOTAL) BY MOUTH EVERY 8 (EIGHT) HOURS AS NEEDED FOR NAUSEA OR VOMITING. 30 tablet 3   oxyCODONE (OXY IR/ROXICODONE) 5 MG immediate release tablet Take 1 tablet (5 mg total) by mouth every 8 (eight) hours as needed for severe pain. 45 tablet 0   palbociclib (IBRANCE) 125 MG tablet TAKE 1 TABLET (125 MG TOTAL) BY MOUTH DAILY. TAKE FOR 21 DAYS ON, 7 DAYS OFF, REPEAT EVERY 28 DAYS. 21 tablet 0   polyethylene glycol (MIRALAX) 17 g packet Take 17 g by mouth daily as needed. (Patient not taking: Reported on 07/08/2019) 14 each 8   potassium chloride SA (KLOR-CON  M20) 20 MEQ tablet Take 1 tablet (20 mEq total) by mouth 2 (two) times daily. 90 tablet 1   prochlorperazine (COMPAZINE) 10 MG tablet Take 1 tablet (10 mg total) by mouth every 6 (six) hours as needed for nausea or vomiting. 30 tablet 2   No current facility-administered medications on file prior to visit.    Allergies: No Known Allergies Past Medical History:  Past Medical History:  Diagnosis Date   Anemia    Anxiety    Cancer ([place]) dx'd 06/2017   breast cancer right   Depression    Diabetes mellitus without complication ([facility])    GERD (gastroesophageal reflux disease)    Headache    History of radiation therapy 11/10/17- 12/08/17   right breast, 40.05 Gy in 15 fractions. Right breast boost 10 Gy in 5 fractions.    Metastatic cancer to bone ([facility]) dx'd 09/2018   pelvis, spine and ribs   Personal history of chemotherapy    Personal history of radiation therapy    Past Surgical History:  Past Surgical History:  Procedure Laterality Date   APPLICATION OF CRANIAL NAVIGATION N/A 05/17/2019   Procedure: APPLICATION OF CRANIAL NAVIGATION;  Surgeon: Judith Part, MD;  Location: [facility];  Service: Neurosurgery;  Laterality: N/A;   CERVICAL CONE BIOPSY     CHOLECYSTECTOMY      CRANIOTOMY N/A 05/17/2019   Procedure: Suboccipital craniotomy for tumor resection with brainlab;  Surgeon: Judith Part, MD;  Location: [facility];  Service: Neurosurgery;  Laterality: N/A;  posterior/suboccipital   IR RADIOLOGIST EVAL & MGMT  11/03/2018   KYPHOPLASTY N/A 11/12/2018   Procedure: LUMBAR TWO  BIOPSY AND LUMBAR TWO KYPHOPLASTY;  Surgeon: Melina Schools, MD;  Location: [facility];  Service: Orthopedics;  Laterality: N/A;  90 mins   MASTECTOMY WITH RADIOACTIVE SEED GUIDED EXCISION AND AXILLARY SENTINEL LYMPH NODE BIOPSY Right 07/08/2017   Procedure: RIGHT LUMPTECTOMY WITH RADIOACTIVE SEED GUIDED EXCISION AND RIGHT AXILLARY SENTINEL LYMPH NODE BIOPSY ERAS PATHWAY;  Surgeon: [name], MD;  Location: [facility];  Service: General;  Laterality: Right;  PEC BLOCK   OPEN REDUCTION INTERNAL FIXATION (ORIF) DISTAL RADIAL FRACTURE  03/17/2012   Procedure: OPEN REDUCTION INTERNAL FIXATION (ORIF) DISTAL RADIAL FRACTURE;  Surgeon: Roseanne Kaufman, MD;  Location: [facility];  Service: Orthopedics;  Laterality: Right;  Pre-operative a supra-clavical block right arm in addition to general anesthesia   PORT-A-CATH REMOVAL Right 02/02/2018   Procedure: REMOVAL PORT-A-CATH;  Surgeon: [name], MD;  Location: [facility];  Service: General;  Laterality: Right;   PORTACATH PLACEMENT N/A 07/30/2017   Procedure: INSERTION PORT-A-CATH WITH [place];  Surgeon: [name], MD;  Location: [facility];  Service: General;  Laterality: N/A;   TUBAL LIGATION     Social History:  Social History   Socioeconomic History   Marital status: Single    Spouse name: Not on file   Number of children: Not on file   Years of education: Not on file   Highest education level: Not on file  Occupational History   Not on file  Tobacco Use   Smoking status: Never Smoker   Smokeless tobacco: Never Used  Substance and Sexual Activity   Alcohol use: Yes    Comment: occassionally   Drug use: No    Sexual activity: Yes    Birth control/protection: Surgical  Other Topics Concern   Not on file  Social History Narrative   Lives with roommate   Works fulltime but seasonal work  Social Determinants of Health   Financial Resource Strain:    Difficulty of Paying Living Expenses:   Food Insecurity:    Worried About Charity fundraiser in the Last Year:    Arboriculturist in the Last Year:   Transportation Needs:    Film/video editor (Medical):    Lack of Transportation (Non-Medical):   Physical Activity:    Days of Exercise per Week:    Minutes of Exercise per Session:   Stress:    Feeling of Stress :   Social Connections:    Frequency of Communication with Friends and Family:    Frequency of Social Gatherings with Friends and Family:    Attends Religious Services:    Active Member of Clubs or Organizations:    Attends Music therapist:    Marital Status:   Intimate Partner Violence:    Fear of Current or Ex-Partner:    Emotionally Abused:    Physically Abused:    Sexually Abused:    Family History:  Family History  Problem Relation Age of Onset   Hypertension Mother    Diabetes Father    Hypertension Father     Review of Systems: Constitutional: Doesn't report fevers, chills or abnormal weight loss Eyes: Doesn't report blurriness of vision Ears, nose, mouth, throat, and face: Doesn't report sore throat Respiratory: Doesn't report cough, dyspnea or wheezes Cardiovascular: Doesn't report palpitation, chest discomfort  Gastrointestinal:  Doesn't report nausea, constipation, diarrhea GU: Doesn't report incontinence Skin: Doesn't report skin rashes Neurological: Per HPI Musculoskeletal: Doesn't report joint pain Behavioral/Psych: Doesn't report anxiety  Physical Exam: Vitals:   07/16/19 0923  BP: (!) 129/92  Pulse: 95  Resp: 18  Temp: 98 F (36.7 C)  SpO2: 93%   KPS: 60. General: Alert, cooperative, pleasant, in no  acute distress Head: Normal EENT: No conjunctival injection or scleral icterus.  Lungs: Resp effort normal Cardiac: Regular rate Abdomen: Non-distended abdomen Skin: No rashes cyanosis or petechiae. Extremities: No clubbing or edema  Neurologic Exam: Mental Status: Awake, alert, attentive to examiner. Oriented to self and environment. Language is fluent with intact comprehension.  Cranial Nerves: Visual acuity is grossly normal. Visual fields are full. Extra-ocular movements intact with saccadic intrusions. No ptosis. Face is symmetric Motor: Tone and bulk are normal. Power is full in both arms and legs. Reflexes are symmetric, no pathologic reflexes present.  Sensory: Decreased right arm and leg Gait: Dystaxic, not independent  Labs: I have reviewed the data as listed    Component Value Date/Time   NA 139 07/15/2019 1116   K 4.6 07/15/2019 1116   CL 107 07/15/2019 1116   CO2 21 (L) 07/15/2019 1116   GLUCOSE 190 (H) 07/15/2019 1116   BUN 33 (H) 07/15/2019 1116   CREATININE 0.96 07/15/2019 1116   CALCIUM 8.2 (L) 07/15/2019 1116   CALCIUM 6.9 (LL) 12/22/2018 1610   PROT 6.2 (L) 07/15/2019 1116   ALBUMIN 3.2 (L) 07/15/2019 1116   AST 12 (L) 07/15/2019 1116   ALT 15 07/15/2019 1116   ALKPHOS 53 07/15/2019 1116   BILITOT 0.4 07/15/2019 1116   GFRNONAA >60 07/15/2019 1116   GFRAA >60 07/15/2019 1116   Lab Results  Component Value Date   WBC 6.0 07/15/2019   NEUTROABS 5.4 07/15/2019   HGB 12.4 07/15/2019   HCT 37.1 07/15/2019   MCV 94.4 07/15/2019   PLT 133 (L) 07/15/2019    Imaging:  MR Brain W and Wo Contrast  Addendum  Date: 07/12/2019   ADDENDUM REPORT: 07/12/2019 11:04 ADDENDUM: There is persistent nonspecific subcentimeter (9 x 9 x 8 mm) soft tissue at the caudal aspect of the resection cavity with ill-defined enhancement. Electronically Signed   By: Macy Mis M.D.   On: 07/12/2019 11:04   Result Date: 07/12/2019 CLINICAL DATA:  Dizziness, metastatic breast  cancer post resection EXAM: MRI HEAD WITHOUT AND WITH CONTRAST TECHNIQUE: Multiplanar, multiecho pulse sequences of the brain and surrounding structures were obtained without and with intravenous contrast. CONTRAST:  70m GADAVIST GADOBUTROL 1 MMOL/ML IV SOLN COMPARISON:  05/31/2019 FINDINGS: Brain: Postoperative changes are again present at the right dorsal medulla near the cervicomedullary junction with resection cavity and chronic blood products. There is no new nodular enhancement to suggest recurrent tumor. Enhancement in the adjacent cerebellum has essentially resolved and was likely secondary to subacute ischemia. There is no acute infarction or intracranial hemorrhage. No hydrocephalus. Vascular: Major vessel flow voids at the skull base are preserved. Skull and upper cervical spine: Suboccipital craniectomy with cranioplasty. Suspected partial resection of the posterior arch of C1. There is a postoperative collection about the cranioplasty extending into the midline neck best seen on sagittal T1 sequence slightly increased in size. Small focus of T1 hypointensity the ventral aspect of C2 vertebral body could reflect a metastasis. Previous abnormal signal at C4 is not well evaluated due to artifact. Sinuses/Orbits: Left maxillary sinus retention cyst. Orbits are unremarkable. Other: Sella is unremarkable.  Trace mastoid fluid opacification. IMPRESSION: Expected evolution of postoperative changes near the cervicomedullary junction without evidence of recurrent tumor. Small increase in size of postoperative fluid collection extending from the cranioplasty into the posterior neck. Electronically Signed: By: PMacy MisM.D. On: 07/08/2019 14:07     Assessment/Plan Brain Metastasis Vertigo  Ms. HBloodsworthpresents today with progressive clinical symptoms related to her brainstem metastasis, and subsequent surgical and radiotherapeutic interventions.  Her vertiginous symptoms appear to be worsening over the  past few weeks since she completed RT.  There are no additional cranial nerve or focal brainstem signs at this time.   We recommended judicious use of Meclizine, and weaning decadron because of poor efficacy.  She should decrease to 469mdaily x7 days, then 34m69maily x7 days, then 1mg28mily x7 days, then stop.  For vertigo and dsytaxia, will refer to neuro-rehab for physical and occupational therapy.  Vestibular-focused rehab will also be of benefit.  We appreciate the opportunity to participate in the care of KimbMiguel Aschoff We ask that KimbMiguel Aschoffurn to clinic in 3 months following next brain MRI, or sooner as needed.  All questions were answered. The patient knows to call the clinic with any problems, questions or concerns. No barriers to learning were detected.  The total time spent in the encounter was 40 minutes and more than 50% was on counseling and review of test results   ZachVentura Sellers Medical Director of Neuro-Oncology [facility] Kings[facility] LLCWeslBlacklake02/21 9:21 AM

## 2019-07-19 ENCOUNTER — Other Ambulatory Visit: Payer: Self-pay

## 2019-07-19 ENCOUNTER — Encounter (HOSPITAL_COMMUNITY): Payer: Self-pay

## 2019-07-19 ENCOUNTER — Other Ambulatory Visit: Payer: Self-pay | Admitting: Hematology

## 2019-07-19 ENCOUNTER — Ambulatory Visit (HOSPITAL_COMMUNITY): Admission: RE | Admit: 2019-07-19 | Payer: Medicaid Other | Source: Ambulatory Visit

## 2019-07-20 ENCOUNTER — Telehealth: Payer: Self-pay | Admitting: Internal Medicine

## 2019-07-20 ENCOUNTER — Other Ambulatory Visit: Payer: Self-pay | Admitting: Nurse Practitioner

## 2019-07-20 ENCOUNTER — Telehealth: Payer: Self-pay

## 2019-07-20 MED ORDER — OXYCODONE HCL 5 MG PO TABS: 5.0000 mg | ORAL_TABLET | Freq: Three times a day (TID) | ORAL | 0 refills | Status: DC | PRN

## 2019-07-20 NOTE — Telephone Encounter (Signed)
Ms Riches called requesting refill for oxycodone.  Request forwarded to [name], NP

## 2019-07-20 NOTE — Telephone Encounter (Signed)
Scheduled per los. Called and left msg. Mailed printout  °

## 2019-07-21 ENCOUNTER — Ambulatory Visit (HOSPITAL_COMMUNITY): Payer: Medicaid Other

## 2019-07-21 ENCOUNTER — Other Ambulatory Visit: Payer: Self-pay | Admitting: Radiation Therapy

## 2019-07-21 MED FILL — IBRANCE 125 MG TABS: 125 | 28 days supply | Qty: 21 | Fill #0

## 2019-07-23 ENCOUNTER — Ambulatory Visit: Payer: Medicaid Other | Admitting: Hematology

## 2019-07-26 ENCOUNTER — Other Ambulatory Visit: Payer: Self-pay | Admitting: Hematology

## 2019-07-26 ENCOUNTER — Telehealth: Payer: Self-pay

## 2019-07-26 MED ORDER — MORPHINE SULFATE ER 15 MG PO TBCR: 15.0000 mg | EXTENDED_RELEASE_TABLET | Freq: Every day | ORAL | 0 refills | Status: DC

## 2019-07-26 NOTE — Telephone Encounter (Signed)
Caitlin Cohen left vm stating that she has not yet been rescheduled for the PET scan and that she needs a refill for [name].  Dr. Burr Medico has refilled Caitlin contin.  I spoke with Lovena Le in Radiology and they will be rescheduling the PET scan this week.  Caitlin Cohen verbalized understanding

## 2019-07-27 ENCOUNTER — Ambulatory Visit: Payer: Medicaid Other | Admitting: Radiation Oncology

## 2019-07-27 ENCOUNTER — Other Ambulatory Visit: Payer: Self-pay

## 2019-07-27 DIAGNOSIS — R7309 Other abnormal glucose: Secondary | ICD-10-CM

## 2019-07-27 MED ORDER — METFORMIN HCL 500 MG PO TABS: 500.0000 mg | ORAL_TABLET | Freq: Two times a day (BID) | ORAL | 0 refills | Status: AC

## 2019-07-30 ENCOUNTER — Ambulatory Visit: Payer: Medicaid Other | Admitting: Radiation Oncology

## 2019-08-06 ENCOUNTER — Telehealth: Payer: Self-pay

## 2019-08-06 ENCOUNTER — Telehealth: Payer: Self-pay | Admitting: Hematology

## 2019-08-06 NOTE — Telephone Encounter (Signed)
Caitlin Cohen left vm stating she needed refill for her oxycodone, she hasn't been rescheduled for her PET scan and she needs appts.  I spoke to Caitlin Cohen I told her that Dr. Burr Medico and Regan Rakers are gone for the day so her oxycdone will be refilled on Monday.  I told her I will call regarding the PET scan on Monday.  I have sent a scheduling message for lab. Dr. Burr Medico and injection for the firs week in may.

## 2019-08-06 NOTE — Telephone Encounter (Signed)
Scheduled appt per 4/23 sch message - pt is aware of appt date and time   

## 2019-08-09 ENCOUNTER — Other Ambulatory Visit: Payer: Self-pay | Admitting: Hematology

## 2019-08-09 MED ORDER — OXYCODONE HCL 5 MG PO TABS: 5.0000 mg | ORAL_TABLET | Freq: Three times a day (TID) | ORAL | 0 refills | Status: DC | PRN

## 2019-08-11 ENCOUNTER — Other Ambulatory Visit: Payer: Self-pay

## 2019-08-11 ENCOUNTER — Telehealth: Payer: Self-pay | Admitting: Hematology

## 2019-08-11 DIAGNOSIS — C50211 Malignant neoplasm of upper-inner quadrant of right female breast: Secondary | ICD-10-CM

## 2019-08-11 NOTE — Telephone Encounter (Signed)
Scheduled appt per 4/28 sch message - pt aware of new appt date and time

## 2019-08-16 ENCOUNTER — Other Ambulatory Visit: Payer: Self-pay | Admitting: Hematology

## 2019-08-18 ENCOUNTER — Other Ambulatory Visit: Payer: Self-pay | Admitting: Hematology

## 2019-08-18 MED FILL — IBRANCE 125 MG TABS: 125 | 28 days supply | Qty: 21 | Fill #0

## 2019-08-18 NOTE — Progress Notes (Signed)
  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  Cancer Staging: Cancer Staging Malignant neoplasm of upper-inner quadrant of right breast in female, estrogen receptor positive ([place]) Staging form: Breast, AJCC 8th Edition - Clinical stage from 06/26/2017: Stage IIA (cT2, cN0, cM0, G3, ER: Positive, PR: Positive, HER2: Negative) - Signed by Truitt Merle, MD on 07/01/2017 - Pathologic stage from 07/08/2017: Stage IB (pT2, pN0, cM0, G3, ER+, PR+, HER2-, Oncotype DX score: 29) - Signed by [name], NP on 07/25/2017  Intent: Curative  Radiation Treatment Dates: 06/07/2019 through 06/25/2019 Site Technique Total Dose (Gy) Dose per Fx (Gy) Completed Fx Beam Energies  Brain: Brain_PostFos 3D 35/35 2.5 14/14 10X   Narrative: The patient tolerated radiation therapy relatively well.   Plan: Scheduled for follow-up with radiation oncology in 1 mo.  -----------------------------------  [name], MD

## 2019-08-20 ENCOUNTER — Other Ambulatory Visit: Payer: Medicaid Other

## 2019-08-20 ENCOUNTER — Ambulatory Visit (HOSPITAL_COMMUNITY): Payer: Self-pay

## 2019-08-20 ENCOUNTER — Ambulatory Visit: Payer: Medicaid Other | Admitting: Hematology

## 2019-08-20 ENCOUNTER — Ambulatory Visit: Payer: Medicaid Other

## 2019-08-20 ENCOUNTER — Telehealth: Payer: Self-pay

## 2019-08-20 ENCOUNTER — Encounter (HOSPITAL_COMMUNITY): Payer: Self-pay

## 2019-08-20 NOTE — Telephone Encounter (Signed)
Lovena Le called stating that Caitlin Cohen cancelled her PET cerianna scan for the second time.  Lovena Le states they will not be able to obtain more donated tracer.  They will not be able to schedule the PET cerianna scan for her. I relayed this information to Dr. Burr Medico

## 2019-08-21 ENCOUNTER — Other Ambulatory Visit: Payer: Self-pay | Admitting: Hematology

## 2019-08-21 DIAGNOSIS — Z170 Estrogen receptor positive status [ER+]: Secondary | ICD-10-CM

## 2019-08-21 DIAGNOSIS — C7951 Secondary malignant neoplasm of bone: Secondary | ICD-10-CM

## 2019-08-21 DIAGNOSIS — C50211 Malignant neoplasm of upper-inner quadrant of right female breast: Secondary | ICD-10-CM

## 2019-08-21 DIAGNOSIS — R110 Nausea: Secondary | ICD-10-CM

## 2019-08-23 NOTE — Progress Notes (Signed)
[place]   [phone] Fax:(336) [phone]   Clinic Follow up Note   Patient Care Team: Patient, No Pcp Per as PCP - General ([place]) Truitt Merle, MD as Consulting Physician (Hematology) [name], MD as Consulting Physician (General Surgery) [name], MD as Attending Physician (Radiation Oncology) [name], NP as Nurse Practitioner (Nurse Practitioner)  Date of Service:  08/25/2019  CHIEF COMPLAINT: Follow upofmetastaticright breastcancer  SUMMARY OF ONCOLOGIC HISTORY: Oncology History Overview Note  Cancer Staging Malignant neoplasm of upper-inner quadrant of right breast in female, estrogen receptor positive ([place]) Staging form: Breast, AJCC 8th Edition - Clinical stage from 06/26/2017: Stage IIA (cT2, cN0, cM0, G3, ER: Positive, PR: Positive, HER2: Negative) - Signed by Truitt Merle, MD on 07/01/2017 - Pathologic stage from 07/08/2017: Stage IB (pT2, pN0, cM0, G3, ER+, PR+, HER2-, Oncotype DX score: 29) - Signed by [name], NP on 07/25/2017     Malignant neoplasm of upper-inner quadrant of right breast in female, estrogen receptor positive ([place])  06/23/2017 Mammogram   IMPRESSION: 1. There is a highly suspicious mass measuring 3.5 cm mammographically in the right breast at the palpable site identified by the patient. 2. There is 1 suspicious lymph node with cortex measuring up to 6 mm. 3.  No mammographic evidence of malignancy in the left breast.   06/26/2017 Initial Biopsy   Diagnosis 06/26/17 1. Breast, right, needle core biopsy, upper inner quadrant, 12:30 o'clock, 7cm from nipple - INVASIVE DUCTAL CARCINOMA - SEE COMMENT 2. Lymph node, needle/core biopsy, right axillary (level 2 node) - NO CARCINOMA IDENTIFIED IN ONE LYMPH NODE (0/1)   06/26/2017 Receptors her2   Prognostic indicators significant for: ER, 70% positive and PR, 70% positive, both with strong staining intensity. Proliferation marker Ki67 at  60%. HER2 negative.   07/01/2017 Initial Diagnosis   Malignant neoplasm of upper-inner quadrant of right breast in female, estrogen receptor positive ([place])   07/08/2017 Pathology Results   Diagnosis 1. Breast, lumpectomy, Right - INVASIVE DUCTAL CARCINOMA, NOTTINGHAM GRADE 3 OF 3, 3.5 CM - MARGINS UNINVOLVED BY CARCINOMA (0.1 CM, SUPERIOR MARGIN) - PREVIOUS BIOPSY SITE CHANGES - SEE ONCOLOGY TABLE BELOW 2. Soft tissue, biopsy, Axillary - BENIGN FIBROADIPOSE TISSUE - NO MALIGNANCY IDENTIFIED 3. Lymph node, sentinel, biopsy, Right axillary - NO CARCINOMA IDENTIFIED IN ONE LYMPH NODE (0/1) 4. Lymph node, sentinel, biopsy, Right axillary - NO CARCINOMA IDENTIFIED IN ONE LYMPH NODE (0/1) - PREVIOUS BIOPSY SITE CHANGES - SEE COMMENT 5. Breast, excision, Right superior margin - BENIGN BREAST TISSUE - NO RESIDUAL CARCINOMA IDENTIFIED 6. Breast, excision, Right inferior margin - BENIGN BREAST TISSUE - NO RESIDUAL CARCINOMA IDENTIFIED   07/08/2017 Cancer Staging   Staging form: Breast, AJCC 8th Edition - Pathologic stage from 07/08/2017: Stage IB (pT2, pN0, cM0, G3, ER+, PR+, HER2-, Oncotype DX score: 29) - Signed by [name], NP on 07/25/2017   07/22/2017 Oncotype testing   Recurrence Score: 29 Distant Recurrence Risk at 9 years with Ai or Tamoxifen alone: 18% Absolute Chemotherapy Benefit: > 15 %   08/08/2017 -  Chemotherapy   Adjuvant chemo Taxotere/Cytoxanq3 weeks x4 cycles with Udenyca -Granix added from cycle 2. Discontinued Udenyca following cycle 2.    11/10/2017 - 12/08/2017 Radiation Therapy   Radiation treatment dates:   11/10/2017 to 12/08/2017  Site/dose:    1. The Right breast was treated to 40.05 Gy in 15 fractions of 2.67 Gy. 2. The Right breast was boosted to 10 Gy in 5  fractions of 2 Gy.  Beams/energy:    1. 3D// 10X 2. Isodose plan w/photons // 10X, 6X  Narrative: The patient tolerated radiation treatment relatively well.   She developed moderate  hyperpigmentation to the right breast with treatment. She denies any pain or fatigue. She continues to use radiaplex twice daily as directed.    12/2017 - 11/2018 Anti-estrogen oral therapy   started Tamoxifen 92m daily in 12/2017. Stopped in 11/2018 due to cancer recurrance. Restarted in early 12/2018 and switched to Letrozole on 01/14/19.    08/11/2018 Survivorship   SCP virtual visit per LCira Rue NP    10/27/2018 Imaging   CT CAP W Contrast 10/27/18  IMPRESSION: 1. Multifocal lytic bone metastases are identified involving the left ribs, L2 vertebra, and right iliac wing. There is of pathologic fracture involving the L2 vertebra. 2. Single small, nonspecific pulmonary nodule in the right upper lobe is noted. 3. Ill-defined low-density structure in right lobe of liver measures 1.3 cm and is indeterminate. This may be better characterized with contrast enhanced liver MRI or PET-CT.   10/28/2018 Imaging   Whole Body Bone Scan 10/28/18  IMPRESSION: Sites of uptake in the left third and sixth ribs, right iliac crest and L2 vertebral body are compatible with sites of osseous metastatic disease with lesions visualized on CT from 1 day prior   Non-specific soft tissue uptake is seen at the site of prior right lumpectomy.   Likely urinary contamination in the soft tissues of the right pelvis, buttock and right flank.   11/12/2018 Relapse/Recurrence   Diagnosis 1. Bone, biopsy, Lumbar 2, Left Pedicle - BONE WITH MARROW FIBROSIS. - NO MALIGNANCY IDENTIFIED. 2. Bone, biopsy, Lumbar 2, Right Pedicle - METASTATIC CARCINOMA, SEE COMMENT.  The tumor cells are NEGATIVE for Her2 (0). Estrogen Receptor: 0%, NEGATIVE Progesterone Receptor: 0%, NEGATIVE   12/02/2018 - 12/18/2018 Radiation Therapy   L2 palliative RT (12/02/18-12/09/18) and will start palliative RT to right iliac lesion 12/10/18 and completed on 12/18/18 with Dr. SIsidore Moos   12/03/2018 Pathology Results   Diagnosis 12/03/18 Bone, biopsy,  iliac crest, right - METASTATIC CARCINOMA. Results: IMMUNOHISTOCHEMICAL AND MORPHOMETRIC ANALYSIS PERFORMED MANUALLY The tumor cells are NEGATIVE for Her2 (1+). Estrogen Receptor: 80%, POSITIVE, STRONG STAINING INTENSITY Progesterone Receptor: 20%, POSITIVE, STRONG STAINING INTENSITY   12/16/2018 -  Chemotherapy   Zometa injection q376monthstarting 12/16/18. Due to flu like symptoms this was switched to monthly Xgeva with second dose on 03/17/19    12/28/2018 -  Chemotherapy   Ibrance 3 weeks on/1 week off starting 12/28/18 Letrozole once daily starting 01/14/19 with monthly Zoledex injections starting 12/16/18.    02/16/2019 Imaging   CT CAP W Contrast  IMPRESSION: Chest.   1. New peripheral consolidation in the LEFT upper lobe with air bronchograms adjacent to lytic lesions within the third and sixth rib is favored radiation change. Recommend attention on follow-up. 2. No new or progressive breast cancer in thorax.   Abdomen / Pelvis.   1. No evidence of metastatic disease in the soft tissues of the abdomen pelvis. 2. Increase in size of lytic metastasis in the RIGHT iliac CREST. 3. Interval L2 kyphoplasty at site a pathologic fracture.     02/16/2019 Imaging   Whole Body bone scan  IMPRESSION: 1. Stable to improved uptake associated with previous left anterior rib lesions. 2. Increased uptake within known lytic metastasis involving the L2 vertebra and anterior right iliac crest. 3. No new sites of disease.   04/29/2019 Imaging  MRI brain  IMPRESSION: 1.9 x 1.2 x 0.8 cm mass of the inferior fourth ventricle, extending at the foramen of Luschka on the right. Findings most worrisome for ependymoma. See above discussion.   These results will be called to the ordering clinician or representative by the Radiologist Assistant, and communication documented in the PACS or zVision Dashboard.   05/17/2019 Imaging   MRI brain  IMPRESSION: 1. Status post fourth ventricle lesion  resection. Small amount of residual contrast enhancement within the dorsal parenchyma at the level of the cervicomedullary junction, favored to be postsurgical. This will serve as a baseline for future studies. 2. Small focus of acute ischemia within the medial right cerebellar hemisphere. 3. Pneumocephalus.   05/17/2019 Surgery   Suboccipital craniotomy for tumor resection with brainlab by Dr. Zada Finders. Only 0.2cm of tumor was removed and biopsied. He was not able to fully resect it.     05/17/2019 Pathology Results   FINAL MICROSCOPIC DIAGNOSIS:   A. BRAIN, TUMOR, BIOPSY:  - Metastatic carcinoma, see comment.   COMMENT:   Immunohistochemistry is positive for cytokeratin 7 and GATA-3.  Cytokeratin 20 is negative. The findings are consistent with metastatic  breast carcinoma. Prognostic markers have been ordered.  ADDENDUM:   PROGNOSTIC INDICATOR RESULTS:  The tumor cells are EQUIVOCAL for Her2 (2+).   Estrogen Receptor: 95%, STRONG STAINING INTESITY  Progesterone Receptor: 20%, STRONG STAINING INTESITY  GROUP 5:   HER2 **NEGATIVE**    05/31/2019 Imaging   MRI brain  IMPRESSION: 1. Postsurgical changes from partial resection of a medullary lesion with persistent nodular contrast enhancement, not significantly changed from prior MRI. 2. Interval development of contrast enhancement in the anterior inferior aspect of the cerebellum, in the area of previously seen restricted diffusion, consistent with post ischemic changes, although concurrent tumor extension cannot be entirely excluded.   06/07/2019 Imaging   CT CAP W contrast  IMPRESSION: 1. Signs of pleural and parenchymal scarring in the left chest associated with radiotherapy with diminished consolidative change compared to the prior study. 2. Stable 4 mm perifissural nodule along the major fissure of the right chest. 3. Increasing sclerosis of left ribs as described may reflect response to therapy with healing. 4.  New lytic focus with sclerotic margin in the right iliac bone posteriorly measuring 1.8 cm. New small focus of sclerosis in the posterior left acetabulum measuring approximately 11 mm. These areas were not visible on the prior study, perhaps reflecting interval healing, related to ongoing therapy given findings of increased sclerosis and other areas though mixed response is considered. Close attention on follow-up is suggested. 5. Redemonstration of destructive process in the right iliac bone anteriorly along the right iliac CREST, greatest axial dimension approximately 4.1 cm as compared to 4.3 cm. May also reflect post treatment related improvement given further sclerosis, attention on follow-up. 6. New small focus of sclerosis along the lateral margin of the greater trochanter (image 93, no measurable lesion in this location). Attention on subsequent bone scan and follow-up evaluations. Is   06/07/2019 Imaging   Bone Scan whole body  IMPRESSION: 1. Stable areas of increased activity noted over the left anterior third and sixth ribs. Stable area of increased activity noted in L2.   2. Subtle new foci of increased activity noted over the lower thoracic spine. A new proximal right femoral focus of increased activity also noted. Findings suggest progressive metastatic disease.     06/07/2019 - 06/25/2019 Radiation Therapy   Brain Radiation by Dr. Isidore Moos  07/08/2019 Imaging   MRI Brain  IMPRESSION: Expected evolution of postoperative changes near the cervicomedullary junction without evidence of recurrent tumor.   Small increase in size of postoperative fluid collection extending from the cranioplasty into the posterior neck  ADDENDUM: There is persistent nonspecific subcentimeter (9 x 9 x 8 mm) soft tissue at the caudal aspect of the resection cavity with ill-defined enhancement.       CURRENT THERAPY:  -Zometa infusionq33month starting 12/16/18, Due to flu like  symptoms will switchedto monthlyXgevawith second dosein 03/2019 -Monthly Zoladex injections starting9/2/20 -Tamoxifen while starting Zoladex and awaiting to start AI, then switched toLetrozole with 2nd Zoladex injection10/1/20. -Ibrance 3 weeks on/1 week off starting9/14/20.Held week before-one week after brain surgery in 05/2019.  -Brain RT starting 06/07/19-06/24/19   INTERVAL HISTORY:  Caitlin Veazeyis here for a follow up. She presents to the clinic alone. She notes she has been feeling bad today with dizziness. She has hypotension with first check of BP and improved to normal range. She is not on HTN meds. She [place] [facility] [facility] [place] She notes her BG is adequate. She is no longer on Meclozine. She notes last week she had a dizzy spell and her oxycodone TID fell down the sink in her bathroom and her last dose was taken at least 5-6 days. She denies sign of withdrawal. She notes she has bene taking Ibuprofen since but has not been working for her. She is still on MS Contin once nightly. She notes she is able to sleep. She notes her activity level is based on her dizziness. She notes she has fallen a few times, but not in the past 3 weeks. The dizziness started after RT. She notes her last period was in 05/2017.     REVIEW OF SYSTEMS:   Constitutional: Denies fevers, chills or abnormal weight loss (+) Dizziness Eyes: Denies blurriness of vision Ears, nose, mouth, throat, and face: Denies mucositis or sore throat Respiratory: Denies cough, dyspnea or wheezes Cardiovascular: Denies palpitation, chest discomfort or lower extremity swelling Gastrointestinal:  Denies nausea, heartburn or change in bowel habits Skin: Denies abnormal skin rashes (+) Hair loss  Lymphatics: Denies new lymphadenopathy or easy bruising Neurological:Denies numbness, tingling or new weaknesses Behavioral/Psych: Mood is stable, no new changes  All other systems were reviewed with the  patient and are negative.  MEDICAL HISTORY:  Past Medical History:  Diagnosis Date  . Anemia   . Anxiety   . Cancer ([facility] dx'd 06/2017   breast cancer right  . Depression   . Diabetes mellitus without complication ([place]   . GERD (gastroesophageal reflux disease)   . Headache   . History of radiation therapy 11/10/17- 12/08/17   right breast, 40.05 Gy in 15 fractions. Right breast boost 10 Gy in 5 fractions.   . Metastatic cancer to bone ([facility] dx'd 09/2018   pelvis, spine and ribs  . Personal history of chemotherapy   . Personal history of radiation therapy     SURGICAL HISTORY: Past Surgical History:  Procedure Laterality Date  . APPLICATION OF CRANIAL NAVIGATION N/A 05/17/2019   Procedure: APPLICATION OF CRANIAL NAVIGATION;  Surgeon: OJudith Part MD;  [facility]  Service: Neurosurgery;  Laterality: N/A;  . CERVICAL CONE BIOPSY    . CHOLECYSTECTOMY    . CRANIOTOMY N/A 05/17/2019   Procedure: Suboccipital craniotomy for tumor resection with brainlab;  Surgeon: OJudith Part MD;  [facility]  Service: Neurosurgery;  Laterality: N/A;  posterior/suboccipital  . IR RADIOLOGIST EVAL & MGMT  11/03/2018  . KYPHOPLASTY N/A 11/12/2018   Procedure: LUMBAR TWO  BIOPSY AND LUMBAR TWO KYPHOPLASTY;  Surgeon: Melina Schools, MD;  Location: [facility];  Service: Orthopedics;  Laterality: N/A;  90 mins  . MASTECTOMY WITH RADIOACTIVE SEED GUIDED EXCISION AND AXILLARY SENTINEL LYMPH NODE BIOPSY Right 07/08/2017   Procedure: RIGHT LUMPTECTOMY WITH RADIOACTIVE SEED GUIDED EXCISION AND RIGHT AXILLARY SENTINEL LYMPH NODE BIOPSY ERAS PATHWAY;  Surgeon: [name], MD;  Location: [facility];  Service: General;  Laterality: Right;  PEC BLOCK  . OPEN REDUCTION INTERNAL FIXATION (ORIF) DISTAL RADIAL FRACTURE  03/17/2012   Procedure: OPEN REDUCTION INTERNAL FIXATION (ORIF) DISTAL RADIAL FRACTURE;  Surgeon: Roseanne Kaufman, MD;  Location: [place];  Service: Orthopedics;  Laterality: Right;   Pre-operative a supra-clavical block right arm in addition to general anesthesia  . PORT-A-CATH REMOVAL Right 02/02/2018   Procedure: REMOVAL PORT-A-CATH;  Surgeon: [name], MD;  Location: [place];  Service: General;  Laterality: Right;  . PORTACATH PLACEMENT N/A 07/30/2017   Procedure: INSERTION PORT-A-CATH WITH [place];  Surgeon: [name], MD;  Location: [place];  Service: General;  Laterality: N/A;  . TUBAL LIGATION      I have reviewed the social history and family history with the patient and they are unchanged from previous note.  ALLERGIES:  has No Known Allergies.  MEDICATIONS:  Current Outpatient Medications  Medication Sig Dispense Refill  . acetaminophen (TYLENOL) 325 MG tablet Take 650 mg by mouth every 6 (six) hours as needed for mild pain.    . blood glucose meter kit and supplies KIT Dispense based on patient and insurance preference. Use up to four times daily as directed. (FOR ICD-9 250.00, 250.01). 1 each 0  . dexamethasone (DECADRON) 4 MG tablet Take 1 tablet (4 mg total) by mouth 3 (three) times daily. On 3/24, start taking 1 tablet twice daily. On 4/3, start taking 1 tablet daily. On 4/16, start taking half a tablet daily, until you run out. Take with food. (Patient taking differently: Take 4 mg by mouth See admin instructions. 46m twice daily as of 07/07/2019 for 7 days, then 487mdaily until finished) 65 tablet 0  . gabapentin (NEURONTIN) 300 MG capsule Take 1-2 capsules (300-600 mg total) by mouth See admin instructions. Take 300 mg by mouth in the morning and afternoon and 600 mg at bedtime 270 capsule 2  . IBRANCE 125 MG tablet TAKE 1 TABLET (125 MG TOTAL) BY MOUTH DAILY. TAKE FOR 21 DAYS ON, 7 DAYS OFF, REPEAT EVERY 28 DAYS. 21 tablet 0  . ibuprofen (ADVIL) 200 MG tablet Take 400-600 mg by mouth every 6 (six) hours as needed for headache or mild pain.    . [place] Kitchenetrozole (FEMARA) 2.5 MG tablet TAKE 1 TABLET BY MOUTH EVERY DAY (Patient taking  differently: Take 2.5 mg by mouth daily. ) 90 tablet 1  . meclizine (ANTIVERT) 25 MG tablet Take 1 tablet (25 mg total) by mouth 3 (three) times daily as needed for dizziness. 15 tablet 0  . metFORMIN (GLUCOPHAGE) 500 MG tablet Take 1 tablet (500 mg total) by mouth 2 (two) times daily with a meal. 180 tablet 0  . morphine (MS CONTIN) 15 MG 12 hr tablet Take 1 tablet (15 mg total) by mouth at bedtime. 30 tablet 0  . ondansetron (ZOFRAN) 8 MG tablet TAKE 1 TABLET (8 MG TOTAL) BY MOUTH EVERY 8 (EIGHT) HOURS AS NEEDED FOR NAUSEA OR VOMITING. 30[place]  tablet 3  . oxyCODONE (OXY IR/ROXICODONE) 5 MG immediate release tablet Take 1 tablet (5 mg total) by mouth every 8 (eight) hours as needed for severe pain. 60 tablet 0  . polyethylene glycol (MIRALAX) 17 g packet Take 17 g by mouth daily as needed. (Patient not taking: Reported on 07/08/2019) 14 each 8  . potassium chloride SA (KLOR-CON M20) 20 MEQ tablet Take 1 tablet (20 mEq total) by mouth 2 (two) times daily. 90 tablet 1  . prochlorperazine (COMPAZINE) 10 MG tablet TAKE 1 TABLET (10 MG TOTAL) BY MOUTH EVERY 6 (SIX) HOURS AS NEEDED FOR NAUSEA OR VOMITING. 30 tablet 2   No current facility-administered medications for this visit.    PHYSICAL EXAMINATION: ECOG PERFORMANCE STATUS: 2 - Symptomatic, <50% confined to bed  Vitals:   08/25/19 1428  BP: 110/60  Pulse: (!) 111  Resp: 18  Temp: 98.2 F (36.8 C)  SpO2: 100%   Filed Weights   08/25/19 1428  Weight: 271 lb 12.8 oz (123.3 kg)    Due to COVID19 we will limit examination to appearance. Patient had no complaints.  GENERAL:alert, no distress and comfortable SKIN: skin color normal, no rashes or significant lesions  EYES: normal, Conjunctiva are pink and non-injected, sclera clear  NEURO: alert & oriented x 3 with fluent speech   LABORATORY DATA:  I have reviewed the data as listed CBC Latest Ref Rng & Units 08/25/2019 07/15/2019 07/08/2019  WBC 4.0 - 10.5 K/uL 3.5(L) 6.0 4.6  Hemoglobin 12.0  - 15.0 g/dL 10.9(L) 12.4 12.9  Hematocrit 36.0 - 46.0 % 33.9(L) 37.1 38.3  Platelets 150 - 400 K/uL 232 133(L) 299     CMP Latest Ref Rng & Units 08/25/2019 07/15/2019 07/08/2019  Glucose 70 - 99 mg/dL 115(H) 190(H) 113(H)  BUN 6 - 20 mg/dL 22(H) 33(H) 28(H)  Creatinine 0.44 - 1.00 mg/dL 1.05(H) 0.96 0.86  Sodium 135 - 145 mmol/L 142 139 136  Potassium 3.5 - 5.1 mmol/L 4.4 4.6 4.4  Chloride 98 - 111 mmol/L 106 107 102  CO2 22 - 32 mmol/L 24 21(L) 26  Calcium 8.9 - 10.3 mg/dL 9.0 8.2(L) 8.9  Total Protein 6.5 - 8.1 g/dL 6.5 6.2(L) 6.8  Total Bilirubin 0.3 - 1.2 mg/dL <0.2(L) 0.4 0.6  Alkaline Phos 38 - 126 U/L 52 53 45  AST 15 - 41 U/L 16 12(L) 19  ALT 0 - 44 U/L 16 15 32      RADIOGRAPHIC STUDIES: I have personally reviewed the radiological images as listed and agreed with the findings in the report. No results found.   ASSESSMENT & PLAN:  Caitlin Cohen is a 54 y.o. female with    1. Malignant neoplasm of the upper-outer quadrant of right breast, base of ductal carcinoma, pT2N0M0, stage IIA, grade 3, ER+ /PR +, HER2 -, stage IB, Oncotype RS 29, metastatic bone disease in 10/2018, oligo brain met in 04/2019 -She was initially diagnosed in 06/2017. She is s/p right breastlumpectomy, adjuvantchemoTC and adjuvant radiationand was treated with Tamoxifen since 12/2017. Due to disease metastatic recurrence, she stoppedon 01/14/19. -In 10/2018 she was diagnosedwith cancer recurrence now metastatic to the bone confirmed on 11/2018 Second bone biopsy.  -She underwentpalliativeradiation to right iliac and L2 and Kyphoplasty for her cancer related bone pain. -I started systemic treatment with first line Letrozole starting 01/14/19 with monthly Zoladex injection andoral CD4K inhibitor Ibrance 3 weeks on/1 week offstarting 12/28/18. -She plans to have BSO in 2021 so she can stop Zoladex  injections. -She went in for brain surgery on 05/17/19. Biopsy of the tumor unfortunately showed brain  metastasis from her breast cancer, ER+/PR+/HER2-.  -She has completed Brain RT with Dr. Isidore Moos 06/07/19-06/25/19 and stopped Dexa on 08/24/19. She has been having hair loss and dizziness since.  -PDL1 and FO are pending. -Her CT CAP and bone scan from 06/07/19 shows mixed response with overall impression is disease progression. Due to her short response to AI Ibrance, her tumor could have heterogenous ER+ expression. I recommended [place] PET scan that recognized estrogen receptors for further evaluation. -She was not able to make her scheduled and rescheduled [place] PET scans. Will monitor with next CT scan instead.  -She continues to tolerate treatment well, stable. Proceed with Zoladex and Xgeva injection today  -Continue Letrozole daily, Ibrance (currently on Day 10), Zoladex injections monthly (next in 4 weeks) and Xgeva injectionsmonthly(next in 4 weeks) -F/u in 3 weeks with CT CAP.   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 -She underwent L2 Kyphoplastyand biopsy by Dr. Georgiann Mohs 11/12/18. -Her8/20/20 bone biopsy without decalcification showed her metastasis to be ER/PE positive and her2 negative.  -Shecompleted L2 palliative RT (12/02/18-12/09/18) and palliative RIto right iliac lesion (12/10/18-12/18/18)with Dr. Isidore Moos. -She started Zometa q37month on 12/16/18.She had flu like symptoms, likely related with her.Was switched to monthly Xgevain 03/2019. -Due to recent right facial itching and numbness she had brain scan for further eval. Her 04/29/19 Brain MRI showed a 1.9cm mass at the 4th ventricle. She has had progression of right sided numbness mainly in her face.  -She went in for brain surgery on 05/17/19 but was not able to completely resect. Biopsy was done instead and showed brain metastasis from her breast cancer.  -She has completed  Brain RT with Dr. SIsidore Moos2/22/21-3/12/21 and on Oral Dexamethasone until 08/24/19. I encouraged her to watch for fatigue and appetite off steroids. She has dizziness s/p RT.  -Her CT CAP and bone scan from 06/07/19 shows mixed response with new lesion of pelvic region and right femur. She does have increase right hip pain which is related.  -Her back pain is rare now and her hip pain has increased to 5/10. She is currently on MS Contin 173mto 1 tab nightlywith oxycodone 38m538mID. She also has Gabapentin 300m74md she will continue -5-6 days ago she took her last dose of Oxycodone when she had a dizzy spell and her oxycodone fell down the sink. She has been out since and using Ibuprofen. She still uses MS Contin once nightly and needs a refill.  -I will refill her MS Contin (08/25/19) but cannot refill oxycodone until it is due. She understands. Her main pain is in her right hip and back, which is tolerable now.   3. N&V, Mild Constipation -Her nausea is well controlled with dailyZofran and Compazine, continue. -Shehas beenable to eat and drink enough to maintain her weight. -Her constipation is likely related to her pain medication use.ContinueMiralax use, at leastTID -Well controlled with antiemetics.  4. Depression/Anxiety  -She has a hx of depression and was previously treated with medication. Not currently on medication and she feels that her depression is controlled. -Mood is mostly stable on Letrozole and Zoladex.  5. Mild neutropenia -secondary to Ibrance, mild, will continue monitoring. Stable   6.Goal of care discussion  -The patient understands the goal of care is palliative. -she is full  code now  7. DM -She is currently on Metformin 575m BID, diagnosed after initial chemotherapy. She is still looking for PCP to manage this.   8. Hypokalemia -On 223m potassium, continue daily.  9. Dizziness, Hypotensive  -She has been dizzy since brain RT and fallen a  few times, but not in the past 3 weeks. Likely related to her brain mets and radiation  -She recommend she continue to eat and drink well and check her BP at home.    Plan -I refilled MS Contin today, will refill oxycodone next week when it's due  -Proceed with Xgeva and Zoladex injection today  -Continue Zoladex monthly, Xgevamonthly, Letrozole daily, Ibrance 3 weeks on/1 week off. She is currently on Day 10.   -F/u in 3 weeks with lab and CT CAP w contrast a few days before   No problem-specific Assessment & Plan notes found for this encounter.   Orders Placed This Encounter  Procedures  . NM Bone Scan Whole Body    Standing Status:   Future    Standing Expiration Date:   08/24/2020    Order Specific Question:   If indicated for the ordered procedure, I authorize the administration of a radiopharmaceutical per Radiology protocol    Answer:   Yes    Order Specific Question:   Is the patient pregnant?    Answer:   No    Order Specific Question:   [facility]    [facility]  Order Specific Question:   Radiology Contrast Protocol - do NOT remove file path    Answer:   \\[email or web address]\[email or web address]  . CT Abdomen Pelvis W Contrast    Standing Status:   Future    Standing Expiration Date:   08/24/2020    Order Specific Question:   If indicated for the ordered procedure, I authorize the administration of contrast media per Radiology protocol    Answer:   Yes    Order Specific Question:   Is patient pregnant?    Answer:   No    Order Specific Question:   [facility]    [facility]  Order Specific Question:   Release to patient    Answer:   Immediate    Order Specific Question:   Is Oral Contrast requested for this exam?    Answer:   Yes, Per Radiology protocol    Order Specific Question:   Radiology Contrast Protocol - do NOT remove file path    Answer:    \\[email or web address]\Radiant\CTProtocols.pdf  . CT Chest W Contrast    Standing Status:   Future    Standing Expiration Date:   08/24/2020    Order Specific Question:   If indicated for the ordered procedure, I authorize the administration of contrast media per Radiology protocol    Answer:   Yes    Order Specific Question:   Is patient pregnant?    Answer:   No    Order Specific Question:   [facility]    [facility]  Order Specific Question:   Radiology Contrast Protocol - do NOT remove file path    Answer:   \\[email or web address]\[email or web address]\Radiant\CTProtocols.pdf   All questions were answered. The patient knows to call the clinic with any problems, questions or concerns. No barriers to learning was detected. The total time spent in the appointment was 30 minutes.     YaTruitt Merle  MD 08/25/2019   Oneal Deputy, am acting as scribe for Truitt Merle, MD.   I have reviewed the above documentation for accuracy and completeness, and I agree with the above.

## 2019-08-25 ENCOUNTER — Encounter: Payer: Self-pay | Admitting: Hematology

## 2019-08-25 ENCOUNTER — Other Ambulatory Visit: Payer: Self-pay

## 2019-08-25 ENCOUNTER — Inpatient Hospital Stay (HOSPITAL_BASED_OUTPATIENT_CLINIC_OR_DEPARTMENT_OTHER): Payer: Medicaid Other | Admitting: Hematology

## 2019-08-25 ENCOUNTER — Inpatient Hospital Stay: Payer: Medicaid Other

## 2019-08-25 ENCOUNTER — Inpatient Hospital Stay: Payer: Medicaid Other | Attending: Hematology

## 2019-08-25 VITALS — BP 110/60 | HR 111 | Temp 98.2°F | Resp 18 | Ht 67.0 in | Wt 271.8 lb

## 2019-08-25 DIAGNOSIS — Z170 Estrogen receptor positive status [ER+]: Secondary | ICD-10-CM | POA: Diagnosis not present

## 2019-08-25 DIAGNOSIS — C50211 Malignant neoplasm of upper-inner quadrant of right female breast: Secondary | ICD-10-CM

## 2019-08-25 DIAGNOSIS — C7951 Secondary malignant neoplasm of bone: Secondary | ICD-10-CM | POA: Insufficient documentation

## 2019-08-25 DIAGNOSIS — Z95828 Presence of other vascular implants and grafts: Secondary | ICD-10-CM

## 2019-08-25 DIAGNOSIS — E119 Type 2 diabetes mellitus without complications: Secondary | ICD-10-CM

## 2019-08-25 DIAGNOSIS — Z5111 Encounter for antineoplastic chemotherapy: Secondary | ICD-10-CM | POA: Insufficient documentation

## 2019-08-25 LAB — CMP (CANCER CENTER ONLY)
ALT: 16 U/L (ref 0–44)
AST: 16 U/L (ref 15–41)
Albumin: 3 g/dL — ABNORMAL LOW (ref 3.5–5.0)
Alkaline Phosphatase: 52 U/L (ref 38–126)
Anion gap: 12 (ref 5–15)
BUN: 22 mg/dL — ABNORMAL HIGH (ref 6–20)
CO2: 24 mmol/L (ref 22–32)
Calcium: 9 mg/dL (ref 8.9–10.3)
Chloride: 106 mmol/L (ref 98–111)
Creatinine: 1.05 mg/dL — ABNORMAL HIGH (ref 0.44–1.00)
GFR, Est AFR Am: >60 mL/min (ref >60)
GFR, Estimated: >60 mL/min (ref >60)
Glucose, Bld: 115 mg/dL — ABNORMAL HIGH (ref 70–99)
Potassium: 4.4 mmol/L (ref 3.5–5.1)
Sodium: 142 mmol/L (ref 135–145)
Total Bilirubin: <0.2 mg/dL — ABNORMAL LOW (ref 0.3–1.2)
Total Protein: 6.5 g/dL (ref 6.5–8.1)

## 2019-08-25 LAB — CBC WITH DIFFERENTIAL (CANCER CENTER ONLY)
Abs Immature Granulocytes: 0.02 10*3/uL (ref 0.00–0.07)
Basophils Absolute: 0.1 10*3/uL (ref 0.0–0.1)
Basophils Relative: 2 %
Eosinophils Absolute: 0 10*3/uL (ref 0.0–0.5)
Eosinophils Relative: 0 %
HCT: 33.9 % — ABNORMAL LOW (ref 36.0–46.0)
Hemoglobin: 10.9 g/dL — ABNORMAL LOW (ref 12.0–15.0)
Immature Granulocytes: 1 %
Lymphocytes Relative: 25 %
Lymphs Abs: 0.9 10*3/uL (ref 0.7–4.0)
MCH: 32.2 pg (ref 26.0–34.0)
MCHC: 32.2 g/dL (ref 30.0–36.0)
MCV: 100 fL (ref 80.0–100.0)
Monocytes Absolute: 0.4 10*3/uL (ref 0.1–1.0)
Monocytes Relative: 11 %
Neutro Abs: 2.2 10*3/uL (ref 1.7–7.7)
Neutrophils Relative %: 61 %
Platelet Count: 232 10*3/uL (ref 150–400)
RBC: 3.39 MIL/uL — ABNORMAL LOW (ref 3.87–5.11)
RDW: 17.8 % — ABNORMAL HIGH (ref 11.5–15.5)
WBC Count: 3.5 10*3/uL — ABNORMAL LOW (ref 4.0–10.5)
nRBC: 0 % (ref 0.0–0.2)

## 2019-08-25 MED ORDER — DENOSUMAB 120 MG/1.7ML ~~LOC~~ SOLN
120.0000 mg | Freq: Once | SUBCUTANEOUS | Status: AC
  Administered 2019-08-25: 15:00:00 120 mg via SUBCUTANEOUS

## 2019-08-25 MED ORDER — GOSERELIN ACETATE 3.6 MG ~~LOC~~ IMPL
DRUG_IMPLANT | SUBCUTANEOUS | Status: AC
  Filled 2019-08-25: qty 3.6

## 2019-08-25 MED ORDER — DENOSUMAB 120 MG/1.7ML ~~LOC~~ SOLN
SUBCUTANEOUS | Status: AC
  Filled 2019-08-25: qty 1.7

## 2019-08-25 MED ORDER — GOSERELIN ACETATE 3.6 MG ~~LOC~~ IMPL
3.6000 mg | DRUG_IMPLANT | Freq: Once | SUBCUTANEOUS | Status: AC
  Administered 2019-08-25: 15:00:00 3.6 mg via SUBCUTANEOUS

## 2019-08-25 MED ORDER — MORPHINE SULFATE ER 15 MG PO TBCR: 15.0000 mg | EXTENDED_RELEASE_TABLET | Freq: Every day | ORAL | 0 refills | Status: AC

## 2019-08-25 NOTE — Patient Instructions (Signed)
Denosumab injection What is this medicine? DENOSUMAB (den oh sue mab) slows bone breakdown. Prolia is used to treat osteoporosis in women after menopause and in men, and in people who are taking corticosteroids for 6 months or more. Xgeva is used to treat a high calcium level due to cancer and to prevent bone fractures and other bone problems caused by multiple myeloma or cancer bone metastases. Xgeva is also used to treat giant cell tumor of the bone. This medicine may be used for other purposes; ask your health care provider or pharmacist if you have questions. COMMON BRAND NAME(S): Prolia, XGEVA What should I tell my health care provider before I take this medicine? They need to know if you have any of these conditions:  dental disease  having surgery or tooth extraction  infection  kidney disease  low levels of calcium or Vitamin D in the blood  malnutrition  on hemodialysis  skin conditions or sensitivity  thyroid or parathyroid disease  an unusual reaction to denosumab, other medicines, foods, dyes, or preservatives  pregnant or trying to get pregnant  breast-feeding How should I use this medicine? This medicine is for injection under the skin. It is given by a health care professional in a hospital or clinic setting. A special MedGuide will be given to you before each treatment. Be sure to read this information carefully each time. For Prolia, talk to your pediatrician regarding the use of this medicine in children. Special care may be needed. For Xgeva, talk to your pediatrician regarding the use of this medicine in children. While this drug may be prescribed for children as young as 13 years for selected conditions, precautions do apply. Overdosage: If you think you have taken too much of this medicine contact a poison control center or emergency room at once. NOTE: This medicine is only for you. Do not share this medicine with others. What if I miss a dose? It is  important not to miss your dose. Call your doctor or health care professional if you are unable to keep an appointment. What may interact with this medicine? Do not take this medicine with any of the following medications:  other medicines containing denosumab This medicine may also interact with the following medications:  medicines that lower your chance of fighting infection  steroid medicines like prednisone or cortisone This list may not describe all possible interactions. Give your health care provider a list of all the medicines, herbs, non-prescription drugs, or dietary supplements you use. Also tell them if you smoke, drink alcohol, or use illegal drugs. Some items may interact with your medicine. What should I watch for while using this medicine? Visit your doctor or health care professional for regular checks on your progress. Your doctor or health care professional may order blood tests and other tests to see how you are doing. Call your doctor or health care professional for advice if you get a fever, chills or sore throat, or other symptoms of a cold or flu. Do not treat yourself. This drug may decrease your body's ability to fight infection. Try to avoid being around people who are sick. You should make sure you get enough calcium and vitamin D while you are taking this medicine, unless your doctor tells you not to. Discuss the foods you eat and the vitamins you take with your health care professional. See your dentist regularly. Brush and floss your teeth as directed. Before you have any dental work done, tell your dentist you are   receiving this medicine. Do not become pregnant while taking this medicine or for 5 months after stopping it. Talk with your doctor or health care professional about your birth control options while taking this medicine. Women should inform their doctor if they wish to become pregnant or think they might be pregnant. There is a potential for serious side  effects to an unborn child. Talk to your health care professional or pharmacist for more information. What side effects may I notice from receiving this medicine? Side effects that you should report to your doctor or health care professional as soon as possible:  allergic reactions like skin rash, itching or hives, swelling of the face, lips, or tongue  bone pain  breathing problems  dizziness  jaw pain, especially after dental work  redness, blistering, peeling of the skin  signs and symptoms of infection like fever or chills; cough; sore throat; pain or trouble passing urine  signs of low calcium like fast heartbeat, muscle cramps or muscle pain; pain, tingling, numbness in the hands or feet; seizures  unusual bleeding or bruising  unusually weak or tired Side effects that usually do not require medical attention (report to your doctor or health care professional if they continue or are bothersome):  constipation  diarrhea  headache  joint pain  loss of appetite  muscle pain  runny nose  tiredness  upset stomach This list may not describe all possible side effects. Call your doctor for medical advice about side effects. You may report side effects to FDA at 1-800-FDA-1088. Where should I keep my medicine? This medicine is only given in a clinic, doctor's office, or other health care setting and will not be stored at home. NOTE: This sheet is a summary. It may not cover all possible information. If you have questions about this medicine, talk to your doctor, pharmacist, or health care provider.  2020 Elsevier/Gold Standard (2017-08-08 16:10:44) Goserelin injection What is this medicine? GOSERELIN (GOE se rel in) is similar to a hormone found in the body. It lowers the amount of sex hormones that the body makes. Men will have lower testosterone levels and women will have lower estrogen levels while taking this medicine. In men, this medicine is used to treat prostate  cancer; the injection is either given once per month or once every 12 weeks. A once per month injection (only) is used to treat women with endometriosis, dysfunctional uterine bleeding, or advanced breast cancer. This medicine may be used for other purposes; ask your health care provider or pharmacist if you have questions. COMMON BRAND NAME(S): Zoladex What should I tell my health care provider before I take this medicine? They need to know if you have any of these conditions:  bone problems  diabetes  heart disease  history of irregular heartbeat  an unusual or allergic reaction to goserelin, other medicines, foods, dyes, or preservatives  pregnant or trying to get pregnant  breast-feeding How should I use this medicine? This medicine is for injection under the skin. It is given by a health care professional in a hospital or clinic setting. Talk to your pediatrician regarding the use of this medicine in children. Special care may be needed. Overdosage: If you think you have taken too much of this medicine contact a poison control center or emergency room at once. NOTE: This medicine is only for you. Do not share this medicine with others. What if I miss a dose? It is important not to miss your dose. Call your   doctor or health care professional if you are unable to keep an appointment. What may interact with this medicine? Do not take this medicine with any of the following medications:  cisapride  dronedarone  pimozide  thioridazine This medicine may also interact with the following medications:  other medicines that prolong the QT interval (an abnormal heart rhythm) This list may not describe all possible interactions. Give your health care provider a list of all the medicines, herbs, non-prescription drugs, or dietary supplements you use. Also tell them if you smoke, drink alcohol, or use illegal drugs. Some items may interact with your medicine. What should I watch for  while using this medicine? Visit your doctor or health care provider for regular checks on your progress. Your symptoms may appear to get worse during the first weeks of this therapy. Tell your doctor or healthcare provider if your symptoms do not start to get better or if they get worse after this time. Your bones may get weaker if you take this medicine for a long time. If you smoke or frequently drink alcohol you may increase your risk of bone loss. A family history of osteoporosis, chronic use of drugs for seizures (convulsions), or corticosteroids can also increase your risk of bone loss. Talk to your doctor about how to keep your bones strong. This medicine should stop regular monthly menstruation in women. Tell your doctor if you continue to menstruate. Women should not become pregnant while taking this medicine or for 12 weeks after stopping this medicine. Women should inform their doctor if they wish to become pregnant or think they might be pregnant. There is a potential for serious side effects to an unborn child. Talk to your health care professional or pharmacist for more information. Do not breast-feed an infant while taking this medicine. Men should inform their doctors if they wish to father a child. This medicine may lower sperm counts. Talk to your health care professional or pharmacist for more information. This medicine may increase blood sugar. Ask your healthcare provider if changes in diet or medicines are needed if you have diabetes. What side effects may I notice from receiving this medicine? Side effects that you should report to your doctor or health care professional as soon as possible:  allergic reactions like skin rash, itching or hives, swelling of the face, lips, or tongue  bone pain  breathing problems  changes in vision  chest pain  feeling faint or lightheaded, falls  fever, chills  pain, swelling, warmth in the leg  pain, tingling, numbness in the hands  or feet  signs and symptoms of high blood sugar such as being more thirsty or hungry or having to urinate more than normal. You may also feel very tired or have blurry vision  signs and symptoms of low blood pressure like dizziness; feeling faint or lightheaded, falls; unusually weak or tired  stomach pain  swelling of the ankles, feet, hands  trouble passing urine or change in the amount of urine  unusually high or low blood pressure  unusually weak or tired Side effects that usually do not require medical attention (report to your doctor or health care professional if they continue or are bothersome):  change in sex drive or performance  changes in breast size in both males and females  changes in emotions or moods  headache  hot flashes  irritation at site where injected  loss of appetite  skin problems like acne, dry skin  vaginal dryness This list  may not describe all possible side effects. Call your doctor for medical advice about side effects. You may report side effects to FDA at 1-800-FDA-1088. Where should I keep my medicine? This drug is given in a hospital or clinic and will not be stored at home. NOTE: This sheet is a summary. It may not cover all possible information. If you have questions about this medicine, talk to your doctor, pharmacist, or health care provider.  2020 Elsevier/Gold Standard (2018-07-20 14:05:56)  

## 2019-08-26 ENCOUNTER — Telehealth: Payer: Self-pay | Admitting: Hematology

## 2019-08-26 LAB — CANCER ANTIGEN 27.29: CA 27.29: 66.9 U/mL — ABNORMAL HIGH (ref 0.0–38.6)

## 2019-08-26 NOTE — Telephone Encounter (Signed)
Scheduled appt per 5/12 los.  Spoke with pt and they are aware of the appt date and time

## 2019-08-30 ENCOUNTER — Other Ambulatory Visit: Payer: Self-pay

## 2019-08-30 ENCOUNTER — Ambulatory Visit: Payer: Medicaid Other | Attending: Internal Medicine | Admitting: Physical Therapy

## 2019-08-30 DIAGNOSIS — R42 Dizziness and giddiness: Secondary | ICD-10-CM | POA: Insufficient documentation

## 2019-08-30 DIAGNOSIS — R209 Unspecified disturbances of skin sensation: Secondary | ICD-10-CM | POA: Insufficient documentation

## 2019-08-30 DIAGNOSIS — R2681 Unsteadiness on feet: Secondary | ICD-10-CM | POA: Diagnosis present

## 2019-08-30 DIAGNOSIS — R296 Repeated falls: Secondary | ICD-10-CM | POA: Diagnosis present

## 2019-08-30 DIAGNOSIS — R2689 Other abnormalities of gait and mobility: Secondary | ICD-10-CM | POA: Insufficient documentation

## 2019-08-30 DIAGNOSIS — R208 Other disturbances of skin sensation: Secondary | ICD-10-CM

## 2019-08-31 ENCOUNTER — Other Ambulatory Visit: Payer: Self-pay | Admitting: Hematology

## 2019-08-31 DIAGNOSIS — D649 Anemia, unspecified: Secondary | ICD-10-CM

## 2019-08-31 NOTE — Therapy (Signed)
[facility] [address] [place], [place], [address] Phone: [phone]   Fax:  [phone]  Physical Therapy Evaluation  Patient Details  Name: Caitlin Cohen MRN: [id number] Date of Birth: 06/21/1965 Referring Provider (PT): Ventura Sellers, MD   Encounter Date: 08/30/2019  PT End of Session - 08/31/19 1541    Visit Number  1    Number of Visits  16    Authorization Type  CCME - awaiting approval of first 3 visits; have requested OT evaluation    Authorization - Visit Number  0    Authorization - Number of Visits  3    PT Start Time  1800    PT Stop Time  1845    PT Time Calculation (min)  45 min    Activity Tolerance  Patient tolerated treatment well    Behavior During Therapy  [facility] for tasks assessed/performed       Past Medical History:  Diagnosis Date  . Anemia   . Anxiety   . Cancer ([place]) dx'd 06/2017   breast cancer right  . Depression   . Diabetes mellitus without complication ([place])   . GERD (gastroesophageal reflux disease)   . Headache   . History of radiation therapy 11/10/17- 12/08/17   right breast, 40.05 Gy in 15 fractions. Right breast boost 10 Gy in 5 fractions.   . Metastatic cancer to bone ([place]) dx'd 09/2018   pelvis, spine and ribs  . Personal history of chemotherapy   . Personal history of radiation therapy     Past Surgical History:  Procedure Laterality Date  . APPLICATION OF CRANIAL NAVIGATION N/A 05/17/2019   Procedure: APPLICATION OF CRANIAL NAVIGATION;  Surgeon: Judith Part, MD;  Location: [facility];  Service: Neurosurgery;  Laterality: N/A;  . CERVICAL CONE BIOPSY    . CHOLECYSTECTOMY    . CRANIOTOMY N/A 05/17/2019   Procedure: Suboccipital craniotomy for tumor resection with brainlab;  Surgeon: Judith Part, MD;  Location: [facility];  Service: Neurosurgery;  Laterality: N/A;  posterior/suboccipital  . IR RADIOLOGIST EVAL & MGMT  11/03/2018  . KYPHOPLASTY N/A 11/12/2018   Procedure: LUMBAR TWO  BIOPSY AND LUMBAR TWO KYPHOPLASTY;  Surgeon: Melina Schools, MD;  Location: [facility];  Service: Orthopedics;  Laterality: N/A;  90 mins  . MASTECTOMY WITH RADIOACTIVE SEED GUIDED EXCISION AND AXILLARY SENTINEL LYMPH NODE BIOPSY Right 07/08/2017   Procedure: RIGHT LUMPTECTOMY WITH RADIOACTIVE SEED GUIDED EXCISION AND RIGHT AXILLARY SENTINEL LYMPH NODE BIOPSY ERAS PATHWAY;  Surgeon: [name], MD;  Location: [facility];  Service: General;  Laterality: Right;  PEC BLOCK  . OPEN REDUCTION INTERNAL FIXATION (ORIF) DISTAL RADIAL FRACTURE  03/17/2012   Procedure: OPEN REDUCTION INTERNAL FIXATION (ORIF) DISTAL RADIAL FRACTURE;  Surgeon: Roseanne Kaufman, MD;  Location: [facility];  Service: Orthopedics;  Laterality: Right;  Pre-operative a supra-clavical block right arm in addition to general anesthesia  . PORT-A-CATH REMOVAL Right 02/02/2018   Procedure: REMOVAL PORT-A-CATH;  Surgeon: [name], MD;  Location: [facility];  Service: General;  Laterality: Right;  . PORTACATH PLACEMENT N/A 07/30/2017   Procedure: INSERTION PORT-A-CATH WITH [place];  Surgeon: [name], MD;  Location: [facility];  Service: General;  Laterality: N/A;  . TUBAL LIGATION      There were no vitals filed for this visit.   Subjective Assessment - 08/30/19 1804    Subjective  Dizziness has eased up some but not gone completely.  Feels it when she first  stands up and sometimes she has fallen.  Wonders if she is "blacking out."  Doesn't happen every time she stands up.  The other day she felt it even with sitting down.  Feels like spinning and lightheadedness.  Had to start using the walker because of the dizziness and falls.    Patient is accompained by:  Family member    Pertinent History  anemia, anxiety, R breast CA with mets to brain with craniotomy for tumor biopsy and radiation, mets to bones: pelvis, spine and ribs, chemotherapy, lumbar kyphoplasty due to L2 compression fracture, R  lumpectomy/mastectomy with axillary sentinel lymph node biopsy, depression, DM, ORIF distal radial fracture    Diagnostic tests  Has mutliple imaging studies scheduled - MRI, PET scan.  Recent MRI showed postoperative changes at the cervicomedullary junction.    Patient Stated Goals  To be able to function more on her own.  To be able to get up and not feel dizzy and to walk without the walker    Currently in Pain?  No/denies         [facility] PT Assessment - 08/30/19 1810      Assessment   Medical Diagnosis  R breast cancer with mets to brain, pelvis, spine and ribs; vertigo, ataxia, fallls    Referring Provider (PT)  Ventura Sellers, MD    Onset Date/Surgical Date  07/16/19    Prior Therapy  none      Precautions   Precautions  Other (comment)    Precaution Comments  RUE restricted for BP, anemia, anxiety, R breast CA with mets to brain with craniotomy for tumor biopsy and radiation, mets to bones: pelvis, spine and ribs, chemotherapy, lumbar kyphoplasty due to L2 compression fracture, R lumpectomy/mastectomy with axillary sentinel lymph node biopsy, depression, DM, ORIF distal radial fracture       Restrictions   Weight Bearing Restrictions  No    Other Position/Activity Restrictions  None given      Balance Screen   Has the patient fallen in the past 6 months  Yes    How many times?  8-9    Has the patient had a decrease in activity level because of a fear of falling?   Yes      [place] residence    Living Arrangements  Children    Type of [place] Access  Level entry    Home Layout  One level    [place] - 2 wheels      Prior Function   Level of Independence  Needs assistance with ADLs;Needs assistance with homemaking;Independent with household mobility with device    Comments  Needs assistance with bathing - stepping into the tub and then balance while standing in the shower, cooking, laundry due to  dizziness and imbalance      Observation/Other Assessments   Focus on Therapeutic Outcomes (FOTO)   Not assessed      Sensation   Light Touch  Impaired Detail    Light Touch Impaired Details  Impaired RUE      Coordination   Gross Motor Movements are Fluid and Coordinated  No    Finger Nose Finger Test  mild tremor in RUE; L WFL    Heel Shin Test  impaired due to weakness      ROM / Strength   AROM / PROM / Strength  Strength      Strength  Overall Strength  Deficits    Overall Strength Comments  LLE: 4/5 overall; RLE: 2+/5 overall.  RUE weaker than LUE      Ambulation/Gait   Ambulation/Gait  Yes    Ambulation/Gait Assistance  5: Supervision    Ambulation/Gait Assistance Details  supervision due to dizziness and veering to R with RW    Ambulation Distance (Feet)  100 Feet    Assistive device  Rolling walker    Gait Pattern  Step-through pattern;Decreased step length - right;Decreased step length - left;Decreased stride length;Lateral trunk lean to right    Ambulation Surface  Level;Indoor             Vestibular Assessment - 08/30/19 1821      Symptom Behavior   Subjective history of current problem  Began after radiation; not after surgery.  Denies headaches but when she is sleeping she has a pain in her head that eases when she moves her head/neck a specific way; Denies changes in vision or hearing but has stopped making ear wax, does have mild tinnitus but denies aural fullness.  Denies nausea or vomiting.    Type of Dizziness   Spinning;Lightheadedness    Frequency of Dizziness  intermittent    Duration of Dizziness  seconds    Symptom Nature  Motion provoked    Aggravating Factors  Sit to stand    Relieving Factors  Not applicable    Progression of Symptoms  Worse      Oculomotor Exam   Oculomotor Alignment  Normal    Ocular ROM  WFL    Spontaneous  Absent    Gaze-induced   Absent    Smooth Pursuits  Intact   dysmetria with vertical saccades   Saccades   Slow;Dysmetria    Comment  Convergence intact      Oculomotor Exam-Fixation Suppressed    Left Head Impulse  positive    Right Head Impulse  negative      Vestibulo-Ocular Reflex   VOR to Slow Head Movement  Normal    VOR Cancellation  Normal      Positional Testing   Dix-Hallpike  Dix-Hallpike Right;Dix-Hallpike Left    Horizontal Canal Testing  Horizontal Canal Right;Horizontal Canal Left      Dix-Hallpike Right   Dix-Hallpike Right Duration  0    Dix-Hallpike Right Symptoms  No nystagmus      Dix-Hallpike Left   Dix-Hallpike Left Duration  0    Dix-Hallpike Left Symptoms  No nystagmus      Horizontal Canal Right   Horizontal Canal Right Duration  0    Horizontal Canal Right Symptoms  Normal      Horizontal Canal Left   Horizontal Canal Left Duration  0    Horizontal Canal Left Symptoms  Normal      Positional Sensitivities   Sit to Supine  No dizziness    Supine to Left Side  No dizziness    Supine to Right Side  No dizziness    Supine to Sitting  No dizziness    Right Hallpike  No dizziness    Up from Right Hallpike  No dizziness    Up from Left Hallpike  No dizziness    Nose to Right Knee  No dizziness    Right Knee to Sitting  No dizziness    Nose to Left Knee  No dizziness    Left Knee to Sitting  Lightheadedness    Head Turning x 5  Mild dizziness  Head Nodding x 5  Mild dizziness    Pivot Right in Standing  No dizziness    Pivot Left in Standing  No dizziness    Rolling Right  No dizziness    Rolling Left  No dizziness          Objective measurements completed on examination: See above findings.              PT Education - 08/31/19 1541    Education Details  clinicla findings, areas to continue to assess, PT POC and goals    Person(s) Educated  Patient;Child(ren)    Methods  Explanation    Comprehension  Verbalized understanding       PT Short Term Goals - 08/31/19 1551      PT SHORT TERM GOAL #1   Title  Pt will participate in  further gait/balance training with 10 meter walk test and DGI    Baseline  Not assessed to date    Time  4    Period  Weeks    Status  New      PT SHORT TERM GOAL #2   Title  Pt will participate in further assessment of falls risk and orthostatic hypotension    Baseline  not assessed to date    Time  4    Period  Weeks    Status  New    Target Date  08/31/19      PT SHORT TERM GOAL #3   Title  Pt will initiate and demonstrate ability to perform HEP focusing on gaze adaptation, LE strengthening and balance with daughter's supervision    Baseline  total A to date    Time  4    Period  Weeks    Status  New      PT SHORT TERM GOAL #4   Title  Pt will report being able to perform sit <> stand in therapy or at home with 50% reduction in frequency of dizziness when first rising    Baseline  Reports dizziness most of the time when she stands with frequent falls due to dizziness    Time  4    Period  Weeks    Status  New        PT Long Term Goals - 08/31/19 1558      PT LONG TERM GOAL #1   Title  Pt will demonstrate independence with final LE strength, vestibular and balance HEP    Baseline  dependent to date    Time  10    Period  Weeks    Status  New      PT LONG TERM GOAL #2   Title  Pt will demonstrate decreased falls risk as indicated by 4 point improvement in DGI    Baseline  Not assessed to date    Time  10    Period  Weeks    Status  New      PT LONG TERM GOAL #3   Title  Pt will demonstrate increased safety with gait as indicated by increase in gait velocity to >/= 3.0 ft/sec    Baseline  Not assessed to date    Time  10    Period  Weeks    Status  New      PT LONG TERM GOAL #4   Title  Pt will report 0/5 dizziness with head movements and sit <> stand on a daily basis allowing pt to return to independent ADLs  Baseline  reports dizziness and falls limiting ability to cook, do laundry, bathe independently    Time  10    Period  Weeks    Status  New       PT LONG TERM GOAL #5   Title  Pt will demonstrate ability to safely ambulate 115' over indoor level surfaces without use of RW but with supervision    Baseline  Requires RW to prevent falls indoors    Time  10    Period  Weeks    Status  New             Plan - 08/31/19 1543    Clinical Impression Statement  Patient is a 54 year old female referred to outpatient Neurorehabilitation for vertigo, falls, and ataxia.  Pt's PMH is significant for: anemia, anxiety, R breast CA with mets to brain with craniotomy for tumor biopsy and radiation, mets to bones: pelvis, spine and ribs, chemotherapy, lumbar kyphoplasty due to L2 compression fracture, R lumpectomy/mastectomy with axillary sentinel lymph node biopsy, depression, DM, ORIF distal radial fracture.  Patient's evaluation revealed the following physical impairments and functional limitations: impaired sensation, impaired strength in RUE and RLE affecting patient's independence with ADLs, impaired activity tolerance and endurance, disequilibrium and lightheadedness, motion sensitivity, impaired VOR with positive L head impulse test, impaired balance and impaired gait placing patient at increased risk for falls.  Pt will benefit from OT evaluation in addition to PT.  Pt will benefit from physical therapy interventions to address the above impairments and limitations to maximize functional mobility independence and decrease falls risk and improve quality of life.    Personal Factors and Comorbidities  Comorbidity 3+;Finances;Fitness;Past/Current Experience    Comorbidities  anemia, anxiety, R breast CA with mets to brain with craniotomy for tumor biopsy and radiation, mets to bones: pelvis, spine and ribs, chemotherapy, lumbar kyphoplasty due to L2 compression fracture, R lumpectomy/mastectomy with axillary sentinel lymph node biopsy, depression, DM, ORIF distal radial fracture    Examination-Activity Limitations  Bathing;Bend;Hygiene/Grooming;Locomotion  Level;Stand    Examination-Participation Restrictions  Cleaning;Community Activity;Laundry;Meal Prep    Stability/Clinical Decision Making  Unstable/Unpredictable    Clinical Decision Making  High    Rehab Potential  Fair   due to active metastatic cancer   PT Frequency  Other (comment)   1x/week x 3; 2x/week x 6   PT Duration  Other (comment)   10 weeks total   PT Treatment/Interventions  ADLs/Self Care Home Management;[place] Repostioning;DME Instruction;Gait training;Functional mobility training;Therapeutic activities;Therapeutic exercise;Balance training;Neuromuscular re-education;Patient/family education;Orthotic Fit/Training;Energy conservation;Vestibular    PT Next Visit Plan  Perform orthostatic assessment.  Further assess balance/gait - gait velocity, DGI.  Initiate HEP focusing on: x1 viewing, balance training/habituation, LE strengthening (R>L weakness).    Recommended Other Services  OT    Consulted and Agree with Plan of Care  Patient;Family member/caregiver    Family Member Consulted  daughter       Patient will benefit from skilled therapeutic intervention in order to improve the following deficits and impairments:  Abnormal gait, Decreased activity tolerance, Decreased balance, Decreased endurance, Decreased strength, Difficulty walking, Dizziness, Impaired sensation, Impaired UE functional use  Visit Diagnosis: Repeated falls  Dizziness and giddiness  Unsteadiness on feet  Other abnormalities of gait and mobility  Other disturbances of skin sensation     Problem List Patient Active Problem List   Diagnosis Date Noted  . Vertigo 07/16/2019  . Brain metastasis ([place]) 06/02/2019  . Status post craniotomy 05/17/2019  . Intracranial  tumor ([place]) 05/17/2019  . Brain mass 05/04/2019  . Bone metastases ([place]) 11/30/2018  . S/P kyphoplasty 11/12/2018  . Pathologic compression fracture of lumbar vertebra ([place]) 11/12/2018  . DM (diabetes mellitus), type 2 ([place])  09/28/2017  . Febrile neutropenia ([place]) 09/26/2017  . Sepsis ([place]) 09/26/2017  . Diarrhea 09/26/2017  . Port-A-Cath in place 08/29/2017  . Malignant neoplasm of upper-inner quadrant of right breast in female, estrogen receptor positive ([place]) 07/01/2017  . Mood disorder ([place]) 07/05/2013    Class: Acute    Rico Junker, PT, DPT 08/31/19    4:05 PM    [facility] [address] [place], [place], [address] Phone: [phone]   Fax:  [phone]  Name: Caitlin Cohen MRN: [id number] Date of Birth: 1966/01/03

## 2019-09-07 ENCOUNTER — Ambulatory Visit: Payer: Medicaid Other | Admitting: Physical Therapy

## 2019-09-07 ENCOUNTER — Encounter (HOSPITAL_COMMUNITY)
Admission: RE | Admit: 2019-09-07 | Discharge: 2019-09-07 | Disposition: A | Discharge status: Home / Self Care | Payer: Medicaid Other | Source: Ambulatory Visit | Attending: Hematology | Admitting: Hematology

## 2019-09-07 ENCOUNTER — Ambulatory Visit (HOSPITAL_COMMUNITY)
Admission: RE | Admit: 2019-09-07 | Discharge: 2019-09-07 | Disposition: A | Discharge status: Home / Self Care | Payer: Medicaid Other | Source: Ambulatory Visit | Attending: Hematology | Admitting: Hematology

## 2019-09-07 ENCOUNTER — Encounter (HOSPITAL_COMMUNITY): Payer: Self-pay

## 2019-09-07 ENCOUNTER — Other Ambulatory Visit: Payer: Self-pay

## 2019-09-07 DIAGNOSIS — Z170 Estrogen receptor positive status [ER+]: Secondary | ICD-10-CM | POA: Diagnosis present

## 2019-09-07 DIAGNOSIS — C50211 Malignant neoplasm of upper-inner quadrant of right female breast: Secondary | ICD-10-CM

## 2019-09-07 MED ORDER — SODIUM CHLORIDE (PF) 0.9 % IJ SOLN
INTRAMUSCULAR | Status: AC
  Filled 2019-09-07: qty 50

## 2019-09-07 MED ORDER — TECHNETIUM TC 99M MEDRONATE IV KIT
20.0000 | PACK | Freq: Once | INTRAVENOUS | Status: AC | PRN
  Administered 2019-09-07: 20 via INTRAVENOUS

## 2019-09-07 MED ORDER — IOHEXOL 300 MG/ML  SOLN
100.0000 mL | Freq: Once | INTRAMUSCULAR | Status: AC | PRN
  Administered 2019-09-07: 100 mL via INTRAVENOUS

## 2019-09-08 ENCOUNTER — Other Ambulatory Visit: Payer: Self-pay | Admitting: Hematology

## 2019-09-08 ENCOUNTER — Telehealth: Payer: Self-pay

## 2019-09-08 MED ORDER — OXYCODONE HCL 5 MG PO TABS: 5.0000 mg | ORAL_TABLET | Freq: Three times a day (TID) | ORAL | 0 refills | Status: AC | PRN

## 2019-09-08 NOTE — Telephone Encounter (Signed)
Ms Giesbrecht called requesting refill for oxycodone.

## 2019-09-09 ENCOUNTER — Other Ambulatory Visit: Payer: Self-pay

## 2019-09-09 DIAGNOSIS — Z170 Estrogen receptor positive status [ER+]: Secondary | ICD-10-CM

## 2019-09-09 DIAGNOSIS — G9389 Other specified disorders of brain: Secondary | ICD-10-CM

## 2019-09-09 DIAGNOSIS — C7951 Secondary malignant neoplasm of bone: Secondary | ICD-10-CM

## 2019-09-09 MED ORDER — PALBOCICLIB 125 MG PO TABS: ORAL_TABLET | ORAL | 0 refills | Status: AC

## 2019-09-11 ENCOUNTER — Inpatient Hospital Stay (HOSPITAL_COMMUNITY)
Admission: EM | Admit: 2019-09-11 | Discharge: 2019-10-14 | DRG: 871 | Disposition: E | Payer: Medicaid Other | Attending: Pulmonary Disease | Admitting: Pulmonary Disease

## 2019-09-11 ENCOUNTER — Other Ambulatory Visit: Payer: Self-pay

## 2019-09-11 ENCOUNTER — Emergency Department (HOSPITAL_COMMUNITY): Payer: Medicaid Other

## 2019-09-11 ENCOUNTER — Encounter (HOSPITAL_COMMUNITY): Payer: Self-pay | Admitting: Emergency Medicine

## 2019-09-11 ENCOUNTER — Inpatient Hospital Stay (HOSPITAL_COMMUNITY): Payer: Medicaid Other

## 2019-09-11 DIAGNOSIS — I248 Other forms of acute ischemic heart disease: Secondary | ICD-10-CM | POA: Diagnosis present

## 2019-09-11 DIAGNOSIS — Z833 Family history of diabetes mellitus: Secondary | ICD-10-CM

## 2019-09-11 DIAGNOSIS — J690 Pneumonitis due to inhalation of food and vomit: Secondary | ICD-10-CM | POA: Diagnosis present

## 2019-09-11 DIAGNOSIS — C50919 Malignant neoplasm of unspecified site of unspecified female breast: Secondary | ICD-10-CM | POA: Diagnosis not present

## 2019-09-11 DIAGNOSIS — C787 Secondary malignant neoplasm of liver and intrahepatic bile duct: Secondary | ICD-10-CM | POA: Diagnosis present

## 2019-09-11 DIAGNOSIS — C7800 Secondary malignant neoplasm of unspecified lung: Secondary | ICD-10-CM | POA: Diagnosis present

## 2019-09-11 DIAGNOSIS — Z20822 Contact with and (suspected) exposure to covid-19: Secondary | ICD-10-CM | POA: Diagnosis present

## 2019-09-11 DIAGNOSIS — C50411 Malignant neoplasm of upper-outer quadrant of right female breast: Secondary | ICD-10-CM | POA: Diagnosis present

## 2019-09-11 DIAGNOSIS — Z79891 Long term (current) use of opiate analgesic: Secondary | ICD-10-CM

## 2019-09-11 DIAGNOSIS — E874 Mixed disorder of acid-base balance: Secondary | ICD-10-CM | POA: Diagnosis present

## 2019-09-11 DIAGNOSIS — Z515 Encounter for palliative care: Secondary | ICD-10-CM | POA: Diagnosis not present

## 2019-09-11 DIAGNOSIS — R570 Cardiogenic shock: Secondary | ICD-10-CM | POA: Diagnosis present

## 2019-09-11 DIAGNOSIS — Z9221 Personal history of antineoplastic chemotherapy: Secondary | ICD-10-CM | POA: Diagnosis not present

## 2019-09-11 DIAGNOSIS — G9341 Metabolic encephalopathy: Secondary | ICD-10-CM | POA: Diagnosis present

## 2019-09-11 DIAGNOSIS — I468 Cardiac arrest due to other underlying condition: Secondary | ICD-10-CM | POA: Diagnosis present

## 2019-09-11 DIAGNOSIS — A419 Sepsis, unspecified organism: Secondary | ICD-10-CM | POA: Diagnosis not present

## 2019-09-11 DIAGNOSIS — Z66 Do not resuscitate: Secondary | ICD-10-CM | POA: Diagnosis present

## 2019-09-11 DIAGNOSIS — C7951 Secondary malignant neoplasm of bone: Secondary | ICD-10-CM | POA: Diagnosis present

## 2019-09-11 DIAGNOSIS — J15211 Pneumonia due to Methicillin susceptible Staphylococcus aureus: Secondary | ICD-10-CM | POA: Diagnosis present

## 2019-09-11 DIAGNOSIS — E1165 Type 2 diabetes mellitus with hyperglycemia: Secondary | ICD-10-CM | POA: Diagnosis present

## 2019-09-11 DIAGNOSIS — Z923 Personal history of irradiation: Secondary | ICD-10-CM

## 2019-09-11 DIAGNOSIS — J9382 Other air leak: Secondary | ICD-10-CM

## 2019-09-11 DIAGNOSIS — Z79811 Long term (current) use of aromatase inhibitors: Secondary | ICD-10-CM

## 2019-09-11 DIAGNOSIS — G931 Anoxic brain damage, not elsewhere classified: Secondary | ICD-10-CM | POA: Diagnosis present

## 2019-09-11 DIAGNOSIS — I4891 Unspecified atrial fibrillation: Secondary | ICD-10-CM | POA: Diagnosis present

## 2019-09-11 DIAGNOSIS — R6521 Severe sepsis with septic shock: Secondary | ICD-10-CM | POA: Diagnosis present

## 2019-09-11 DIAGNOSIS — J9601 Acute respiratory failure with hypoxia: Secondary | ICD-10-CM | POA: Diagnosis not present

## 2019-09-11 DIAGNOSIS — J80 Acute respiratory distress syndrome: Secondary | ICD-10-CM | POA: Diagnosis present

## 2019-09-11 DIAGNOSIS — Z8249 Family history of ischemic heart disease and other diseases of the circulatory system: Secondary | ICD-10-CM

## 2019-09-11 DIAGNOSIS — G893 Neoplasm related pain (acute) (chronic): Secondary | ICD-10-CM | POA: Diagnosis present

## 2019-09-11 DIAGNOSIS — C7931 Secondary malignant neoplasm of brain: Secondary | ICD-10-CM | POA: Diagnosis present

## 2019-09-11 DIAGNOSIS — I469 Cardiac arrest, cause unspecified: Secondary | ICD-10-CM | POA: Diagnosis present

## 2019-09-11 DIAGNOSIS — J9600 Acute respiratory failure, unspecified whether with hypoxia or hypercapnia: Secondary | ICD-10-CM

## 2019-09-11 DIAGNOSIS — A4101 Sepsis due to Methicillin susceptible Staphylococcus aureus: Secondary | ICD-10-CM | POA: Diagnosis present

## 2019-09-11 DIAGNOSIS — Z79899 Other long term (current) drug therapy: Secondary | ICD-10-CM

## 2019-09-11 DIAGNOSIS — D701 Agranulocytosis secondary to cancer chemotherapy: Secondary | ICD-10-CM | POA: Diagnosis present

## 2019-09-11 DIAGNOSIS — Z7984 Long term (current) use of oral hypoglycemic drugs: Secondary | ICD-10-CM

## 2019-09-11 DIAGNOSIS — N179 Acute kidney failure, unspecified: Secondary | ICD-10-CM | POA: Diagnosis present

## 2019-09-11 DIAGNOSIS — B9561 Methicillin susceptible Staphylococcus aureus infection as the cause of diseases classified elsewhere: Secondary | ICD-10-CM | POA: Diagnosis not present

## 2019-09-11 LAB — URINALYSIS, ROUTINE W REFLEX MICROSCOPIC
Bilirubin Urine: NEGATIVE
Cellular Cast, UA: 6
Glucose, UA: 50 mg/dL — AB
Ketones, ur: NEGATIVE mg/dL
Leukocytes,Ua: NEGATIVE
Nitrite: NEGATIVE
Protein, ur: 100 mg/dL — AB
Specific Gravity, Urine: 1.015 (ref 1.005–1.030)
pH: 5 (ref 5.0–8.0)

## 2019-09-11 LAB — CBG MONITORING, ED: Glucose-Capillary: 191 mg/dL — ABNORMAL HIGH (ref 70–99)

## 2019-09-11 LAB — CBC WITH DIFFERENTIAL/PLATELET
Abs Immature Granulocytes: 0 10*3/uL (ref 0.00–0.07)
Band Neutrophils: 5 %
Basophils Absolute: 0 10*3/uL (ref 0.0–0.1)
Basophils Relative: 0 %
Eosinophils Absolute: 0 10*3/uL (ref 0.0–0.5)
Eosinophils Relative: 0 %
HCT: 27.8 % — ABNORMAL LOW (ref 36.0–46.0)
Hemoglobin: 8.3 g/dL — ABNORMAL LOW (ref 12.0–15.0)
Lymphocytes Relative: 85 %
Lymphs Abs: 2.6 10*3/uL (ref 0.7–4.0)
MCH: 32.2 pg (ref 26.0–34.0)
MCHC: 29.9 g/dL — ABNORMAL LOW (ref 30.0–36.0)
MCV: 107.8 fL — ABNORMAL HIGH (ref 80.0–100.0)
Metamyelocytes Relative: 1 %
Monocytes Absolute: 0.1 10*3/uL (ref 0.1–1.0)
Monocytes Relative: 2 %
Neutro Abs: 0.4 10*3/uL — ABNORMAL LOW (ref 1.7–7.7)
Neutrophils Relative %: 7 %
Platelets: 203 10*3/uL (ref 150–400)
RBC: 2.58 MIL/uL — ABNORMAL LOW (ref 3.87–5.11)
RDW: 19.3 % — ABNORMAL HIGH (ref 11.5–15.5)
WBC: 3.1 10*3/uL — ABNORMAL LOW (ref 4.0–10.5)
nRBC: 4.9 % — ABNORMAL HIGH (ref 0.0–0.2)
nRBC: 6 /100 WBC — ABNORMAL HIGH

## 2019-09-11 LAB — COMPREHENSIVE METABOLIC PANEL
ALT: 37 U/L (ref 0–44)
AST: 63 U/L — ABNORMAL HIGH (ref 15–41)
Albumin: 2.4 g/dL — ABNORMAL LOW (ref 3.5–5.0)
Alkaline Phosphatase: 40 U/L (ref 38–126)
Anion gap: 23 — ABNORMAL HIGH (ref 5–15)
BUN: 50 mg/dL — ABNORMAL HIGH (ref 6–20)
CO2: 12 mmol/L — ABNORMAL LOW (ref 22–32)
Calcium: 7.1 mg/dL — ABNORMAL LOW (ref 8.9–10.3)
Chloride: 110 mmol/L (ref 98–111)
Creatinine, Ser: 6.19 mg/dL — ABNORMAL HIGH (ref 0.44–1.00)
GFR calc Af Amer: 8 mL/min — ABNORMAL LOW (ref >60)
GFR calc non Af Amer: 7 mL/min — ABNORMAL LOW (ref >60)
Glucose, Bld: 244 mg/dL — ABNORMAL HIGH (ref 70–99)
Potassium: 4.9 mmol/L (ref 3.5–5.1)
Sodium: 145 mmol/L (ref 135–145)
Total Bilirubin: 0.4 mg/dL (ref 0.3–1.2)
Total Protein: 5.2 g/dL — ABNORMAL LOW (ref 6.5–8.1)

## 2019-09-11 LAB — ABO/RH: ABO/RH(D): A POS

## 2019-09-11 LAB — POCT I-STAT 7, (LYTES, BLD GAS, ICA,H+H)
Acid-base deficit: 22 mmol/L — ABNORMAL HIGH (ref 0.0–2.0)
Bicarbonate: 9.4 mmol/L — ABNORMAL LOW (ref 20.0–28.0)
Calcium, Ion: 1.01 mmol/L — ABNORMAL LOW (ref 1.15–1.40)
HCT: 28 % — ABNORMAL LOW (ref 36.0–46.0)
Hemoglobin: 9.5 g/dL — ABNORMAL LOW (ref 12.0–15.0)
O2 Saturation: 98 %
Potassium: 5.3 mmol/L — ABNORMAL HIGH (ref 3.5–5.1)
Sodium: 141 mmol/L (ref 135–145)
TCO2: 11 mmol/L — ABNORMAL LOW (ref 22–32)
pCO2 arterial: 46.1 mmHg (ref 32.0–48.0)
pH, Arterial: 6.917 — CL (ref 7.350–7.450)
pO2, Arterial: 187 mmHg — ABNORMAL HIGH (ref 83.0–108.0)

## 2019-09-11 LAB — MAGNESIUM
Magnesium: 1.9 mg/dL (ref 1.7–2.4)
Magnesium: 1.7 mg/dL (ref 1.7–2.4)

## 2019-09-11 LAB — STREP PNEUMONIAE URINARY ANTIGEN: Strep Pneumo Urinary Antigen: POSITIVE — AB

## 2019-09-11 LAB — SARS CORONAVIRUS 2 BY RT PCR (HOSPITAL ORDER, PERFORMED IN ~~LOC~~ HOSPITAL LAB): SARS Coronavirus 2: NEGATIVE

## 2019-09-11 LAB — PROTIME-INR
INR: 1.9 — ABNORMAL HIGH (ref 0.8–1.2)
Prothrombin Time: 21.2 seconds — ABNORMAL HIGH (ref 11.4–15.2)

## 2019-09-11 LAB — BRAIN NATRIURETIC PEPTIDE: B Natriuretic Peptide: 407.8 pg/mL — ABNORMAL HIGH (ref 0.0–100.0)

## 2019-09-11 LAB — RAPID URINE DRUG SCREEN, HOSP PERFORMED
Amphetamines: NOT DETECTED
Barbiturates: NOT DETECTED
Benzodiazepines: NOT DETECTED
Cocaine: POSITIVE — AB
Opiates: POSITIVE — AB
Tetrahydrocannabinol: NOT DETECTED

## 2019-09-11 LAB — TYPE AND SCREEN
ABO/RH(D): A POS
Antibody Screen: NEGATIVE

## 2019-09-11 LAB — GLUCOSE, CAPILLARY
Glucose-Capillary: 147 mg/dL — ABNORMAL HIGH (ref 70–99)
Glucose-Capillary: 169 mg/dL — ABNORMAL HIGH (ref 70–99)

## 2019-09-11 LAB — MRSA PCR SCREENING: MRSA by PCR: NEGATIVE

## 2019-09-11 LAB — LACTIC ACID, PLASMA
Lactic Acid, Venous: >11 mmol/L (ref 0.5–1.9)
Lactic Acid, Venous: 9.5 mmol/L (ref 0.5–1.9)

## 2019-09-11 LAB — HIV ANTIBODY (ROUTINE TESTING W REFLEX): HIV Screen 4th Generation wRfx: NONREACTIVE

## 2019-09-11 LAB — TROPONIN I (HIGH SENSITIVITY)
Troponin I (High Sensitivity): 1282 ng/L (ref <18)
Troponin I (High Sensitivity): 2314 ng/L (ref <18)

## 2019-09-11 LAB — CORTISOL: Cortisol, Plasma: >100 ug/dL

## 2019-09-11 LAB — PHOSPHORUS: Phosphorus: 6.5 mg/dL — ABNORMAL HIGH (ref 2.5–4.6)

## 2019-09-11 MED ORDER — SODIUM BICARBONATE-DEXTROSE 150-5 MEQ/L-% IV SOLN
150.0000 meq | INTRAVENOUS | Status: DC
  Administered 2019-09-11 – 2019-09-12 (×2): 150 meq via INTRAVENOUS
  Filled 2019-09-11 (×4): qty 1000

## 2019-09-11 MED ORDER — AMIODARONE HCL IN DEXTROSE 360-4.14 MG/200ML-% IV SOLN
30.0000 mg/h | INTRAVENOUS | Status: DC
  Administered 2019-09-12 – 2019-09-14 (×7): 30 mg/h via INTRAVENOUS
  Filled 2019-09-11 (×8): qty 200

## 2019-09-11 MED ORDER — POLYETHYLENE GLYCOL 3350 17 G PO PACK: 17.0000 g | PACK | Freq: Every day | ORAL | Status: DC | PRN

## 2019-09-11 MED ORDER — INSULIN ASPART 100 UNIT/ML ~~LOC~~ SOLN
0.0000 [IU] | SUBCUTANEOUS | Status: DC
  Administered 2019-09-11: 2 [IU] via SUBCUTANEOUS
  Administered 2019-09-11 – 2019-09-12 (×3): 3 [IU] via SUBCUTANEOUS
  Administered 2019-09-12: 2 [IU] via SUBCUTANEOUS
  Administered 2019-09-12: 3 [IU] via SUBCUTANEOUS
  Administered 2019-09-12: 2 [IU] via SUBCUTANEOUS
  Administered 2019-09-12: 3 [IU] via SUBCUTANEOUS
  Administered 2019-09-13: 2 [IU] via SUBCUTANEOUS
  Administered 2019-09-13: 3 [IU] via SUBCUTANEOUS
  Administered 2019-09-13 (×2): 2 [IU] via SUBCUTANEOUS

## 2019-09-11 MED ORDER — ALBUTEROL SULFATE (2.5 MG/3ML) 0.083% IN NEBU: 2.5000 mg | INHALATION_SOLUTION | RESPIRATORY_TRACT | Status: DC | PRN

## 2019-09-11 MED ORDER — SODIUM CHLORIDE 0.9 % IV SOLN
3.0000 g | Freq: Two times a day (BID) | INTRAVENOUS | Status: DC
  Administered 2019-09-11 – 2019-09-14 (×7): 3 g via INTRAVENOUS
  Filled 2019-09-11 (×8): qty 8

## 2019-09-11 MED ORDER — NOREPINEPHRINE 4 MG/250ML-% IV SOLN
0.0000 ug/min | INTRAVENOUS | Status: DC
  Administered 2019-09-11 (×3): 30 ug/min via INTRAVENOUS
  Administered 2019-09-11: 40 ug/min via INTRAVENOUS
  Administered 2019-09-12 (×2): 28 ug/min via INTRAVENOUS
  Administered 2019-09-12: 29 ug/min via INTRAVENOUS
  Administered 2019-09-12: 26 ug/min via INTRAVENOUS
  Administered 2019-09-12 (×2): 25 ug/min via INTRAVENOUS
  Administered 2019-09-12: 28 ug/min via INTRAVENOUS
  Administered 2019-09-12: 30 ug/min via INTRAVENOUS
  Administered 2019-09-13 (×2): 18 ug/min via INTRAVENOUS
  Administered 2019-09-13 (×2): 28 ug/min via INTRAVENOUS
  Administered 2019-09-13: 30 ug/min via INTRAVENOUS
  Administered 2019-09-13: 18 ug/min via INTRAVENOUS
  Administered 2019-09-13: 21 ug/min via INTRAVENOUS
  Administered 2019-09-13: 25 ug/min via INTRAVENOUS
  Administered 2019-09-14 (×11): 40 ug/min via INTRAVENOUS
  Filled 2019-09-11 (×15): qty 250
  Filled 2019-09-11: qty 500
  Filled 2019-09-11 (×3): qty 250
  Filled 2019-09-11 (×3): qty 500
  Filled 2019-09-11 (×6): qty 250

## 2019-09-11 MED ORDER — EPINEPHRINE HCL 5 MG/250ML IV SOLN IN NS
0.5000 ug/min | INTRAVENOUS | Status: DC
  Administered 2019-09-11: 5 ug/min via INTRAVENOUS

## 2019-09-11 MED ORDER — AMIODARONE HCL IN DEXTROSE 360-4.14 MG/200ML-% IV SOLN
60.0000 mg/h | INTRAVENOUS | Status: AC
  Administered 2019-09-11 (×2): 60 mg/h via INTRAVENOUS
  Filled 2019-09-11: qty 200

## 2019-09-11 MED ORDER — AMIODARONE LOAD VIA INFUSION
150.0000 mg | Freq: Once | INTRAVENOUS | Status: AC
  Administered 2019-09-11: 150 mg via INTRAVENOUS
  Filled 2019-09-11: qty 83.34

## 2019-09-11 MED ORDER — MIDAZOLAM HCL 2 MG/2ML IJ SOLN: 2.0000 mg | INTRAMUSCULAR | Status: DC | PRN

## 2019-09-11 MED ORDER — FENTANYL BOLUS VIA INFUSION
50.0000 ug | INTRAVENOUS | Status: DC | PRN
  Administered 2019-09-11: 50 ug via INTRAVENOUS
  Filled 2019-09-11: qty 50

## 2019-09-11 MED ORDER — FAMOTIDINE IN NACL 20-0.9 MG/50ML-% IV SOLN
20.0000 mg | Freq: Every day | INTRAVENOUS | Status: DC
  Administered 2019-09-11 – 2019-09-13 (×3): 20 mg via INTRAVENOUS
  Filled 2019-09-11 (×3): qty 50

## 2019-09-11 MED ORDER — SODIUM CHLORIDE 0.9 % IV BOLUS
1000.0000 mL | Freq: Once | INTRAVENOUS | Status: AC
  Administered 2019-09-11: 1000 mL via INTRAVENOUS

## 2019-09-11 MED ORDER — MIDAZOLAM HCL 2 MG/2ML IJ SOLN
INTRAMUSCULAR | Status: AC
  Filled 2019-09-11: qty 2

## 2019-09-11 MED ORDER — DILTIAZEM HCL-DEXTROSE 125-5 MG/125ML-% IV SOLN (PREMIX)
5.0000 mg/h | INTRAVENOUS | Status: DC
  Administered 2019-09-11 (×2): 5 mg/h via INTRAVENOUS
  Administered 2019-09-12: 10 mg/h via INTRAVENOUS
  Filled 2019-09-11 (×4): qty 125

## 2019-09-11 MED ORDER — FENTANYL 2500MCG IN NS 250ML (10MCG/ML) PREMIX INFUSION
25.0000 ug/h | INTRAVENOUS | Status: DC
  Administered 2019-09-11: 50 ug/h via INTRAVENOUS
  Administered 2019-09-12: 25 ug/h via INTRAVENOUS
  Filled 2019-09-11 (×2): qty 250

## 2019-09-11 MED ORDER — AMIODARONE HCL IN DEXTROSE 360-4.14 MG/200ML-% IV SOLN
INTRAVENOUS | Status: AC
  Filled 2019-09-11: qty 200

## 2019-09-11 MED ORDER — NOREPINEPHRINE 4 MG/250ML-% IV SOLN
INTRAVENOUS | Status: AC
  Administered 2019-09-11: 10 ug/min via INTRAVENOUS
  Filled 2019-09-11: qty 250

## 2019-09-11 MED ORDER — ROCURONIUM BROMIDE 50 MG/5ML IV SOLN
100.0000 mg | Freq: Once | INTRAVENOUS | Status: AC
  Administered 2019-09-11: 100 mg via INTRAVENOUS
  Filled 2019-09-11: qty 10

## 2019-09-11 MED ORDER — SODIUM BICARBONATE 8.4 % IV SOLN
50.0000 meq | Freq: Once | INTRAVENOUS | Status: AC
  Administered 2019-09-11: 50 meq via INTRAVENOUS
  Filled 2019-09-11: qty 50

## 2019-09-11 MED ORDER — ONDANSETRON HCL 4 MG/2ML IJ SOLN: 4.0000 mg | Freq: Four times a day (QID) | INTRAMUSCULAR | Status: DC | PRN

## 2019-09-11 MED ORDER — MIDAZOLAM HCL 2 MG/2ML IJ SOLN
2.0000 mg | INTRAMUSCULAR | Status: DC | PRN
  Administered 2019-09-11: 2 mg via INTRAVENOUS

## 2019-09-11 MED ORDER — PIPERACILLIN-TAZOBACTAM 3.375 G IVPB 30 MIN
3.3750 g | Freq: Once | INTRAVENOUS | Status: AC
  Administered 2019-09-11: 3.375 g via INTRAVENOUS
  Filled 2019-09-11: qty 50

## 2019-09-11 MED ORDER — EPINEPHRINE HCL 5 MG/250ML IV SOLN IN NS
INTRAVENOUS | Status: AC
  Filled 2019-09-11: qty 250

## 2019-09-11 MED ORDER — DOCUSATE SODIUM 100 MG PO CAPS: 100.0000 mg | ORAL_CAPSULE | Freq: Two times a day (BID) | ORAL | Status: DC | PRN

## 2019-09-11 NOTE — Progress Notes (Signed)
Pharmacy Antibiotic Note  Caitlin Cohen is a 54 y.o. female admitted on 08/20/2019 undergoing CPR. Pharmacy has been consulted for unasyn dosing for possible aspiration pneumonia. Pt with Tmax 100.7 and WBC is low at 3.1. Pt with AKI with Scr 6.19.   Plan: Unasyn 3gm IV Q12H F/u renal fxn, C&S, clinical status  Weight: 123.3 kg (271 lb 13.2 oz)  Temp (24hrs), Avg:99.8 F (37.7 C), Min:97.6 F (36.4 C), Max:100.7 F (38.2 C)  Recent Labs  Lab 08/26/2019 1135 08/14/2019 1136  WBC 3.1*  --   CREATININE 6.19*  --   LATICACIDVEN  --  >11.0*    Estimated Creatinine Clearance: 14.3 mL/min (A) (by C-G formula based on SCr of 6.19 mg/dL (H)).    No Known Allergies  Antimicrobials this admission: Unasyn 5/29>> Zosyn x 1 5/29  Dose adjustments this admission: N/A  Microbiology results: Pending  Thank you for allowing pharmacy to be a part of this patient's care.  Aubrynn [place]ona, Rande Lawman 09/04/2019 2:38 PM

## 2019-09-11 NOTE — ED Triage Notes (Signed)
Pt in from via [facility] as CPR, found pulseless at home. 4 Epi's, defib x 1, SVT episode - cardioverted x 2, given Amio 450mg , and pt got back pulses and spontaneous RR. 5 min prior to ED arrival, no pulses, given another Epi, and Narcan. Arrives to ED w/ radial pulses, king airway in place. Hx of drug use, Breast CA w/mets to brain

## 2019-09-11 NOTE — ED Provider Notes (Addendum)
[facility] EMERGENCY DEPARTMENT Provider Note   CSN: [id number] Arrival date & time: 08/27/2019  1057     History Chief Complaint  Patient presents with  . CPR    Caitlin Cohen is a 54 y.o. female.  The history is provided by the EMS personnel and medical records. No language interpreter was used.   Caitlin Cohen is a 54 y.o. female who presents to the Emergency Department complaining of cardiac arrest. Level V caveat due to unresponsiveness. History is provided by EMS. Patient presents the emergency department CPR in progress. EMS reports that she was found down in her home. She was last seen normal yesterday. On their arrival she was pulseless and receive defibrillation for VTach. She was breathing spontaneously on their arrival. She did have return of circulation following defibrillation.  She then went into a rapid tachycardia with a rate of 300 and she was cardioverted it twice. Following cardioversion her heart rate sustained in the 150s and she had return of circulation for about 20 minutes and then she lost pulses again. At that time she was in PEA. She did receive an additional defibrillation times two. She received a total of 450 mg of amiodarone for vtach as well as 2 mg of Narcan for possible opioid overdose and five doses of epi. Per report she was intubated but the two became dislodged and it was removed.   Additional history obtained from family after patient's initial ED arrival. Family state that she was found down in her room surrounded by vomit and there was cocaine present. Last known well yesterday afternoon.    Past Medical History:  Diagnosis Date  . Anemia   . Anxiety   . Cancer ([place]) dx'd 06/2017   breast cancer right  . Depression   . Diabetes mellitus without complication ([place])   . GERD (gastroesophageal reflux disease)   . Headache   . History of radiation therapy 11/10/17- 12/08/17   right breast, 40.05 Gy in 15 fractions. Right  breast boost 10 Gy in 5 fractions.   . Metastatic cancer to bone ([place]) dx'd 09/2018   pelvis, spine and ribs  . Personal history of chemotherapy   . Personal history of radiation therapy     Patient Active Problem List   Diagnosis Date Noted  . Cardiac arrest ([place]) 09/05/2019  . Acute respiratory failure with hypoxemia ([place]) 09/07/2019  . Vertigo 07/16/2019  . Brain metastasis ([place]) 06/02/2019  . Status post craniotomy 05/17/2019  . Intracranial tumor ([place]) 05/17/2019  . Brain mass 05/04/2019  . Bone metastases ([place]) 11/30/2018  . S/P kyphoplasty 11/12/2018  . Pathologic compression fracture of lumbar vertebra ([place]) 11/12/2018  . DM (diabetes mellitus), type 2 ([place]) 09/28/2017  . Febrile neutropenia ([place]) 09/26/2017  . Sepsis ([place]) 09/26/2017  . Diarrhea 09/26/2017  . Port-A-Cath in place 08/29/2017  . Malignant neoplasm of upper-inner quadrant of right breast in female, estrogen receptor positive ([place]) 07/01/2017  . Mood disorder ([place]) 07/05/2013    Class: Acute    Past Surgical History:  Procedure Laterality Date  . APPLICATION OF CRANIAL NAVIGATION N/A 05/17/2019   Procedure: APPLICATION OF CRANIAL NAVIGATION;  Surgeon: Judith Part, MD;  Location: [facility];  Service: Neurosurgery;  Laterality: N/A;  . CERVICAL CONE BIOPSY    . CHOLECYSTECTOMY    . CRANIOTOMY N/A 05/17/2019   Procedure: Suboccipital craniotomy for tumor resection with brainlab;  Surgeon: Judith Part, MD;  Location: [facility];  Service: Neurosurgery;  Laterality: N/A;  posterior/suboccipital  . IR RADIOLOGIST EVAL & MGMT  11/03/2018  . KYPHOPLASTY N/A 11/12/2018   Procedure: LUMBAR TWO  BIOPSY AND LUMBAR TWO KYPHOPLASTY;  Surgeon: Melina Schools, MD;  Location: [facility];  Service: Orthopedics;  Laterality: N/A;  90 mins  . MASTECTOMY WITH RADIOACTIVE SEED GUIDED EXCISION AND AXILLARY SENTINEL LYMPH NODE BIOPSY Right 07/08/2017   Procedure: RIGHT LUMPTECTOMY WITH RADIOACTIVE SEED GUIDED EXCISION AND RIGHT  AXILLARY SENTINEL LYMPH NODE BIOPSY ERAS PATHWAY;  Surgeon: [name], MD;  Location: [facility];  Service: General;  Laterality: Right;  PEC BLOCK  . OPEN REDUCTION INTERNAL FIXATION (ORIF) DISTAL RADIAL FRACTURE  03/17/2012   Procedure: OPEN REDUCTION INTERNAL FIXATION (ORIF) DISTAL RADIAL FRACTURE;  Surgeon: Roseanne Kaufman, MD;  Location: [facility];  Service: Orthopedics;  Laterality: Right;  Pre-operative a supra-clavical block right arm in addition to general anesthesia  . PORT-A-CATH REMOVAL Right 02/02/2018   Procedure: REMOVAL PORT-A-CATH;  Surgeon: [name], MD;  Location: [facility];  Service: General;  Laterality: Right;  . PORTACATH PLACEMENT N/A 07/30/2017   Procedure: INSERTION PORT-A-CATH WITH [place];  Surgeon: [name], MD;  Location: [facility];  Service: General;  Laterality: N/A;  . TUBAL LIGATION       OB History    Gravida  3   Para      Term      Preterm      AB      Living  3     SAB      TAB      Ectopic      Multiple      Live Births  3           Family History  Problem Relation Age of Onset  . Hypertension Mother   . Diabetes Father   . Hypertension Father     Social History   Tobacco Use  . Smoking status: Never Smoker  . Smokeless tobacco: Never Used  Substance Use Topics  . Alcohol use: Yes    Comment: occassionally  . Drug use: No    Home Medications Prior to Admission medications   Medication Sig Start Date End Date Taking? Authorizing Provider  acetaminophen (TYLENOL) 325 MG tablet Take 650 mg by mouth every 6 (six) hours as needed for mild pain.    [provider]  blood glucose meter kit and supplies KIT Dispense based on patient and insurance preference. Use up to four times daily as directed. (FOR ICD-9 250.00, 250.01). 02/19/19   Truitt Merle, MD  dexamethasone (DECADRON) 4 MG tablet Take 1 tablet (4 mg total) by mouth 3 (three) times daily. On 3/24, start taking 1 tablet twice daily. On  4/3, start taking 1 tablet daily. On 4/16, start taking half a tablet daily, until you run out. Take with food. Patient not taking: Reported on 08/30/2019 06/30/19   [name], MD  gabapentin (NEURONTIN) 300 MG capsule Take 1-2 capsules (300-600 mg total) by mouth See admin instructions. Take 300 mg by mouth in the morning and afternoon and 600 mg at bedtime 06/28/19   [name], NP  ibuprofen (ADVIL) 200 MG tablet Take 400-600 mg by mouth every 6 (six) hours as needed for headache or mild pain.    [provider]  letrozole ([place]) 2.5 MG tablet TAKE 1 TABLET BY MOUTH EVERY DAY Patient taking differently: Take 2.5 mg by mouth daily.  04/30/19   Truitt Merle, MD  meclizine (ANTIVERT) 25 MG  tablet Take 1 tablet (25 mg total) by mouth 3 (three) times daily as needed for dizziness. Patient not taking: Reported on 08/30/2019 07/08/19   Sherwood Gambler, MD  metFORMIN (GLUCOPHAGE) 500 MG tablet Take 1 tablet (500 mg total) by mouth 2 (two) times daily with a meal. 07/27/19   Truitt Merle, MD  morphine (MS CONTIN) 15 MG 12 hr tablet Take 1 tablet (15 mg total) by mouth at bedtime. 08/25/19   Truitt Merle, MD  ondansetron (ZOFRAN) 8 MG tablet TAKE 1 TABLET (8 MG TOTAL) BY MOUTH EVERY 8 (EIGHT) HOURS AS NEEDED FOR NAUSEA OR VOMITING. 04/26/19   Truitt Merle, MD  oxyCODONE (OXY IR/ROXICODONE) 5 MG immediate release tablet Take 1 tablet (5 mg total) by mouth every 8 (eight) hours as needed for severe pain. 09/08/19   Truitt Merle, MD  palbociclib (IBRANCE) 125 MG tablet TAKE 1 TABLET (125 MG TOTAL) BY MOUTH DAILY. TAKE FOR 21 DAYS ON, 7 DAYS OFF, REPEAT EVERY 28 DAYS. 09/09/19   Truitt Merle, MD  polyethylene glycol (MIRALAX) 17 g packet Take 17 g by mouth daily as needed. 10/06/18   Robyn Haber, MD  potassium chloride SA (KLOR-CON M20) 20 MEQ tablet Take 1 tablet (20 mEq total) by mouth 2 (two) times daily. 06/22/19   [name], NP  prochlorperazine (COMPAZINE) 10 MG tablet TAKE 1 TABLET (10 MG TOTAL) BY MOUTH  EVERY 6 (SIX) HOURS AS NEEDED FOR NAUSEA OR VOMITING. 08/23/19   Truitt Merle, MD    Allergies    Patient has no known allergies.  Review of Systems   Review of Systems  All other systems reviewed and are negative.   Physical Exam Updated Vital Signs BP (!) 80/60   Pulse 68   Temp (!) 100.9 F (38.3 C)   Resp 18   Wt 123.3 kg   SpO2 96%   BMI 42.57 kg/m   Physical Exam Vitals and nursing note reviewed.  Constitutional:      General: She is in acute distress.     Appearance: She is well-developed. She is ill-appearing.  HENT:     Head: Normocephalic and atraumatic.  Cardiovascular:     Rate and Rhythm: Normal rate and regular rhythm.     Heart sounds: No murmur.  Pulmonary:     Comments: Ventilations assisted with bag valve mask. Good air movement bilaterally. Abdominal:     Palpations: Abdomen is soft.     Tenderness: There is no abdominal tenderness. There is no guarding or rebound.     Comments: Firmness and fullness to palpation over the epigastric region  Musculoskeletal:        General: No tenderness.     Comments: Trace edema to BLE. IO in the left anterior tibia  Skin:    General: Skin is warm and dry.  Neurological:     Comments: GCS one - one - one. Pupils dilated reactive bilaterally  Psychiatric:     Comments: Unable to assess     ED Results / Procedures / Treatments   Labs (all labs ordered are listed, but only abnormal results are displayed) Labs Reviewed  COMPREHENSIVE METABOLIC PANEL - Abnormal; Notable for the following components:      Result Value   CO2 12 (*)    Glucose, Bld 244 (*)    BUN 50 (*)    Creatinine, Ser 6.19 (*)    Calcium 7.1 (*)    Total Protein 5.2 (*)    Albumin 2.4 (*)  AST 63 (*)    GFR calc non Af Amer 7 (*)    GFR calc Af Amer 8 (*)    Anion gap 23 (*)    All other components within normal limits  CBC WITH DIFFERENTIAL/PLATELET - Abnormal; Notable for the following components:   WBC 3.1 (*)    RBC 2.58 (*)     Hemoglobin 8.3 (*)    HCT 27.8 (*)    MCV 107.8 (*)    MCHC 29.9 (*)    RDW 19.3 (*)    nRBC 4.9 (*)    Neutro Abs 0.4 (*)    nRBC 6 (*)    All other components within normal limits  PROTIME-INR - Abnormal; Notable for the following components:   Prothrombin Time 21.2 (*)    INR 1.9 (*)    All other components within normal limits  LACTIC ACID, PLASMA - Abnormal; Notable for the following components:   Lactic Acid, Venous >11.0 (*)    All other components within normal limits  LACTIC ACID, PLASMA - Abnormal; Notable for the following components:   Lactic Acid, Venous 9.5 (*)    All other components within normal limits  URINALYSIS, ROUTINE W REFLEX MICROSCOPIC - Abnormal; Notable for the following components:   APPearance CLOUDY (*)    Glucose, UA 50 (*)    Hgb urine dipstick SMALL (*)    Protein, ur 100 (*)    Bacteria, UA RARE (*)    Non Squamous Epithelial 0-5 (*)    All other components within normal limits  RAPID URINE DRUG SCREEN, HOSP PERFORMED - Abnormal; Notable for the following components:   Opiates POSITIVE (*)    Cocaine POSITIVE (*)    All other components within normal limits  PHOSPHORUS - Abnormal; Notable for the following components:   Phosphorus 6.5 (*)    All other components within normal limits  BRAIN NATRIURETIC PEPTIDE - Abnormal; Notable for the following components:   B Natriuretic Peptide 407.8 (*)    All other components within normal limits  CBG MONITORING, ED - Abnormal; Notable for the following components:   Glucose-Capillary 191 (*)    All other components within normal limits  POCT I-STAT 7, (LYTES, BLD GAS, ICA,H+H) - Abnormal; Notable for the following components:   pH, Arterial 6.917 (*)    pO2, Arterial 187 (*)    Bicarbonate 9.4 (*)    TCO2 11 (*)    Acid-base deficit 22.0 (*)    Potassium 5.3 (*)    Calcium, Ion 1.01 (*)    HCT 28.0 (*)    Hemoglobin 9.5 (*)    All other components within normal limits  TROPONIN I (HIGH  SENSITIVITY) - Abnormal; Notable for the following components:   Troponin I (High Sensitivity) 1,282 (*)    All other components within normal limits  TROPONIN I (HIGH SENSITIVITY) - Abnormal; Notable for the following components:   Troponin I (High Sensitivity) 2,314 (*)    All other components within normal limits  SARS CORONAVIRUS 2 BY RT PCR (HOSPITAL ORDER, [facility])  CULTURE, BLOOD (ROUTINE X 2)  CULTURE, BLOOD (ROUTINE X 2)  URINE CULTURE  CULTURE, BLOOD (ROUTINE X 2)  CULTURE, BLOOD (ROUTINE X 2)  MAGNESIUM  MAGNESIUM  PATHOLOGIST SMEAR REVIEW  HIV ANTIBODY (ROUTINE TESTING W REFLEX)  CORTISOL  STREP PNEUMONIAE URINARY ANTIGEN  LEGIONELLA PNEUMOPHILA SEROGP 1 UR AG  BLOOD GAS, ARTERIAL  HEMOGLOBIN A1C  I-STAT ARTERIAL BLOOD GAS, ED  TYPE AND SCREEN  ABO/RH    EKG EKG Interpretation  Date/Time:  Saturday Sep 11 2019 11:07:10 EDT Ventricular Rate:  69 PR Interval:    QRS Duration: 112 QT Interval:  350 QTC Calculation: 375 R Axis:   76 Text Interpretation: Sinus rhythm Probable anteroseptal infarct, old Abnormal T, consider ischemia, inferior leads Confirmed by Quintella Reichert [phone]) on 09/06/2019 11:29:40 AM   Radiology DG CHEST PORT 1 VIEW  Result Date: 08/19/2019 CLINICAL DATA:  Acute respiratory failure with hypoxemia. Cardiac arrest. EXAM: PORTABLE CHEST 1 VIEW 1:53 p.m. COMPARISON:  09/05/2019 at 11:26 a.m. and CT scan of the chest dated 09/07/2019 FINDINGS: Endotracheal tube in good position 6 cm above the carina. NG tube tip below the diaphragm. Heart size is normal. Left perihilar infiltrate is unchanged. Slight pulmonary vascular prominence. Previously demonstrated sclerotic bone metastasis of the anterior aspect of the left third rib. IMPRESSION: 1. Endotracheal tube and NG tube in good position. No change in the left perihilar infiltrate. 2. Previously demonstrated sclerotic bone metastasis in the anterior aspect of the left  third rib. 3. No significant change since the prior study on the same date. Electronically Signed   By: Lorriane Shire M.D.   On: 08/26/2019 14:06   DG Chest Port 1 View  Result Date: 09/09/2019 CLINICAL DATA:  Cardiac arrest EXAM: PORTABLE CHEST 1 VIEW COMPARISON:  11/09/2018 chest radiograph. FINDINGS: Endotracheal tube tip is 5.7 cm above the carina. Enteric tube enters stomach with the tip not seen on this image. Pacer pads overlie the lateral left chest and lower right chest. Mild cardiomegaly. Otherwise unremarkable mediastinal contour. No pneumothorax. No pleural effusion. Mild diffuse prominence of the parahilar interstitial markings, left greater than right. IMPRESSION: 1. Well-positioned endotracheal and enteric tubes. 2. Mild cardiomegaly with mild diffuse prominence of the parahilar interstitial markings, left greater than right, suggesting asymmetric mild pulmonary edema. Electronically Signed   By: Ilona Sorrel M.D.   On: 08/23/2019 11:39    Procedures Procedures (including critical care time) CRITICAL CARE Performed by: Quintella Reichert   Total critical care time: 60 minutes  Critical care time was exclusive of separately billable procedures and treating other patients.  Critical care was necessary to treat or prevent imminent or life-threatening deterioration.  Critical care was time spent personally by me on the following activities: development of treatment plan with patient and/or surrogate as well as nursing, discussions with consultants, evaluation of patient's response to treatment, examination of patient, obtaining history from patient or surrogate, ordering and performing treatments and interventions, ordering and review of laboratory studies, ordering and review of radiographic studies, pulse oximetry and re-evaluation of patient's condition.  Medications Ordered in ED Medications  EPINEPHrine (ADRENALIN) 4 mg in NS 250 mL (0.016 mg/mL) premix infusion (0 mcg/min  Intravenous Stopped 09/03/2019 1314)  fentaNYL 257mg in NS 2564m(1031mml) infusion-PREMIX (150 mcg/hr Intravenous Rate/Dose Change 08/22/2019 1230)  fentaNYL (SUBLIMAZE) bolus via infusion 50 mcg (50 mcg Intravenous Bolus from Bag 09/13/2019 1142)  midazolam (VERSED) injection 2 mg (2 mg Intravenous Given 08/25/2019 1211)  midazolam (VERSED) injection 2 mg (has no administration in time range)  midazolam (VERSED) 2 MG/2ML injection (  Not Given 08/29/2019 1222)  amiodarone (NEXTERONE PREMIX) 360-4.14 MG/200ML-% (1.8 mg/mL) IV infusion (0 mg/hr  Hold 09/10/2019 1259)  norepinephrine (LEVOPHED) 4mg20m 250mL59mmix infusion (40 mcg/min Intravenous Rate/Dose Change 08/18/2019 1518)  diltiazem (CARDIZEM) 125 mg in dextrose 5% 125 mL (1 mg/mL) infusion (0 mg/hr Intravenous Stopped 08/20/2019 1356)  sodium  bicarbonate 150 mEq in dextrose 5% 1000 mL infusion (150 mEq Intravenous New Bag/Given 08/28/2019 1313)  docusate sodium (COLACE) capsule 100 mg (has no administration in time range)  polyethylene glycol (MIRALAX / GLYCOLAX) packet 17 g (has no administration in time range)  famotidine (PEPCID) IVPB 20 mg premix (has no administration in time range)  ondansetron (ZOFRAN) injection 4 mg (has no administration in time range)  albuterol (PROVENTIL) (2.5 MG/3ML) 0.083% nebulizer solution 2.5 mg (has no administration in time range)  midazolam (VERSED) 2 MG/2ML injection (has no administration in time range)  amiodarone (NEXTERONE PREMIX) 360-4.14 MG/200ML-% (1.8 mg/mL) IV infusion (60 mg/hr Intravenous New Bag/Given 09/01/2019 1417)    Followed by  amiodarone (NEXTERONE PREMIX) 360-4.14 MG/200ML-% (1.8 mg/mL) IV infusion (has no administration in time range)  Ampicillin-Sulbactam (UNASYN) 3 g in sodium chloride 0.9 % 100 mL IVPB (has no administration in time range)  insulin aspart (novoLOG) injection 0-15 Units (has no administration in time range)  sodium chloride 0.9 % bolus 1,000 mL (0 mLs Intravenous Stopped 08/28/2019 1254)    piperacillin-tazobactam (ZOSYN) IVPB 3.375 g (0 g Intravenous Stopped 08/15/2019 1254)  sodium bicarbonate injection 50 mEq (50 mEq Intravenous Given 08/15/2019 1208)  amiodarone (NEXTERONE) 1.8 mg/mL load via infusion 150 mg (150 mg Intravenous Bolus from Bag 09/10/2019 1416)  rocuronium (ZEMURON) injection 100 mg (100 mg Intravenous Given 08/14/2019 1420)    ED Course  I have reviewed the triage vital signs and the nursing notes.  Pertinent labs & imaging results that were available during my care of the patient were reviewed by me and considered in my medical decision making (see chart for details).    MDM Rules/Calculators/A&P                     Patient presented to the emergency department following cardiac arrest with return of circulation followed by recurrent arrest. On pulse check upon patient ED arrival she had present femoral pulses and CPR was discontinued. She did have hypotension and was treated with epinephrine for blood pressure support as well as IV fluid boluses. She was treated with Zosyn for potential aspiration pneumonia. Endotracheal tube secured by PA and supervised by myself. Labs with significant metabolic and respiratory acidosis, she was treated with bicarb for pH less than seven. Critical care consulted for admission for further treatment. Discussed critical nature of illness with multiple family members on separate occasions. CXR Images personally reviewed.  Final Clinical Impression(s) / ED Diagnoses Final diagnoses:  Air leak  Acute respiratory failure with hypoxemia [facility])    Rx / DC Orders ED Discharge Orders    None       Quintella Reichert, MD 09/03/2019 1536    Quintella Reichert, MD 09/12/19 [phone]

## 2019-09-11 NOTE — ED Provider Notes (Signed)
Patient under direct care of EDP Rees. I assisted with intubation.   Physical Exam   CPR in progress  ED Course/Procedures     Procedure Name: Intubation Date/Time: 09/09/2019 11:06 AM Performed by: Kinnie Feil, PA-C Pre-anesthesia Checklist: Patient identified, Emergency Drugs available, Suction available, Patient being monitored and Timeout performed Oxygen Delivery Method: Ambu bag Preoxygenation: Pre-oxygenation with 100% oxygen Induction Type: Rapid sequence Ventilation: Two handed mask ventilation required and Oral airway inserted - appropriate to patient size Laryngoscope Size: Glidescope Tube size: 7.5 mm Number of attempts: 1 Airway Equipment and Method: Video-laryngoscopy Placement Confirmation: ETT inserted through vocal cords under direct vision,  Positive ETCO2,  CO2 detector and Breath sounds checked- equal and bilateral Tube secured with: ETT holder Difficulty Due To: Difficult Airway- due to large tongue and Difficulty was anticipated Comments: Dark emesis noted during intubation      Positive CO2 detector and breath sounds bilaterally per EDP Pending CXR for positioning      Kinnie Feil, PA-C 08/30/2019 1111    Quintella Reichert, MD 09/12/19 [phone]

## 2019-09-11 NOTE — H&P (Signed)
[place] [facility]  History of present illness: I have seen and examined the patient independently and discussed the situation with Eric Form nurse practitioner.  This is a 54 year old female who has metastatic breast cancer first diagnosed in 2019 who was brought in to the [facility] emergency department today with CPR in progress.  She was last seen normal on May 28 around lunchtime.  She was found unresponsive by family on May 29 and 911 was called.  She was noted to be pulseless on EMS arrival CPR was started and she was brought in while undergoing resuscitation via standard ACLS protocol.  She received multiple rounds of epinephrine, some cardioversion attempts, was orally intubated after a nasal intubation and witnessed aspiration.  In our emergency department she is in significant shock requiring Levophed, her creatinine is 6, lactic acid was greater than 11, she is profoundly hypoxemic and she is in atrial fibrillation with RVR.  In reviewing records from her most recent oncology visit it was made clear that treatment goals at this point are only palliative.  She has undergone various forms of surgery, radiation treatment and chemotherapy.  Today there was concern that she had been using illicit drugs prior to her arrival. Total CPR time 25-30 minutes.  Past medical history: Breast cancer Gastroesophageal reflux disease Diabetes mellitus Depression Anxiety Anemia  SH: alcohol use in past, no documented drug use  Allergies: NKDA  Vitals:   08/25/2019 1455 08/17/2019 1456 08/19/2019 1457 08/25/2019 1500  BP:   (!) 74/37 (!) 86/55  Pulse:      Resp: 18 18 18 18   Temp: (!) 100.9 F (38.3 C) (!) 100.9 F (38.3 C) (!) 100.9 F (38.3 C) (!) 100.9 F (38.3 C)  SpO2:      Weight:       No intake or output data in the 24 hours ending 09/07/2019 1503  Vent Mode: PRVC FiO2 (%):  [70 %-100 %] 70 % Set Rate:  [18 bmp] 18 bmp Vt Set:  [490 mL] 490 mL PEEP:  [5 cmH20] 5  cmH20 Plateau Pressure:  [20 cmH20] 20 cmH20  General:  In bed on vent HENT: NCAT ETT in place, biting tongue PULM: CTA B, vent supported breathing CV: RRR, no mgr GI: BS+, soft, nontender MSK: normal bulk and tone Neuro: no purposeful movements, sedated  Chest x-ray images independently reviewed showing a left-sided pleural effusion and airspace disease, cardiomegaly, endotracheal tube in place Labs reviewed showing a lactic acid greater than 11 pH 6.9 SARS COV 2 negative Creatinine 6  Impression: Severe acute respiratory failure with hypoxemia secondary to aspiration pneumonia secondary to inability to protect airway secondary to cardiac arrest of uncertain etiology, concern for drug-induced cardiac arrhythmia versus ischemia Shock: Septic shock versus cardiogenic 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  Considering her poor overall prognosis from an acute standpoint with multiorgan failure and refractory shock on top of a poor long-term prognosis from longstanding metastatic malignancy her overall likelihood of survival is near 0.  I discussed this with the patient's sister Tomi Bamberger who says that she acts as the Tax adviser.  I explained that further intervention such as insertion of central lines, chest tubes or consideration of hemodialysis would cause pain  and suffering with no likelihood of medical benefit.  Further I explained that CPR in the event of another cardiac arrest event would cause pain and suffering without medical benefit.  The patient's sister Tomi Bamberger voiced understanding and agreed with my recommended plan of no escalation of her current  level of care and writing an order for DNR.  Plan: Admit to ICU Use Levophed, however add no other vasopressors No central line No chest tube Continue full mechanical ventilatory support Continue bicarbonate infusion Continue antibiotics, change Zosyn to Unasyn Monitor urine output, basic metabolic panel Ventilator associated pneumonia prevention measures CODE STATUS DNR Consider palliative care consultation depending on progression but at this point it seems that her likelihood of survival over the next 24 hours is extremely low.  We will continue fentanyl infusion for comfort and titrate up if her condition continues to deteriorate to ensure that she is comfortable at the time of death.  My Critical care time 1 hour  Roselie Awkward, MD [place] [facility] Pager: [phone] Cell: [phone] If no response, call [phone]

## 2019-09-11 NOTE — H&P (Addendum)
NAME:  Caitlin Cohen, MRN:  [id number], DOB:  April 02, 1966, LOS: 0 ADMISSION DATE:  08/14/2019, CONSULTATION DATE:  09/03/2019 REFERRING MD:  Caitlin Cohen, CHIEF COMPLAINT:  Cardiac Arrest with ROSC/ Metastatic Breast Cancer with brain mets  Brief History   54 year old female with history of metastatic breast cancer , with mets to bone, brain and liver, Pt.  Presents to the ED 08/27/2019 post cardiac arrest with ROSC in the field. Last known normal 5/28 at 12 noon. She was found pulseless at home by family 5/29 am who stated she was surrounded by cocaine and emesis when they found her. She was treated be EMS  with 4 Epi's, defib x 1, SVT episode - cardioverted x 2, given Amio 428m, for return of pulses.and spontaneous RR.  Then five  min prior to ED arrival, patient lost  Pulses, and was  given another Epi, and Narcan.   She was intubated in the ED, and treatment was initiated for hypotension, acidosis, lactate > 11, maroon OG drainage  and a fib with RVR. PCCM were called to admit and manage care.   History of present illness   54year old female with history of metastatic breast cancer , with mets to bone, brain and liver, Pt.  Presents to the ED 08/18/2019 post cardiac arrest with ROSC in the field. Last known normal 5/28 at 12 noon. She was found pulseless at home by family 5/29 am who stated she was surrounded by cocaine and emesis when they found her. She was treated be EMS  with 4 Epi's, defib x 1, SVT episode - cardioverted x 2, given Amio 4560m for return of pulses.and spontaneous RR.  Then five  min prior to ED arrival, patient lost  Pulses, and was  given another Epi, and Narcan.  At the time this note was written she had received 2 L IVF in the ED  Pt.  was intubated in the ED, and treatment was initiated for hypotension,( Epi and Levophed) acidosis,( Bicarb gtt)  lactate > 11, maroon OG drainage  and a fib with RVR.( Cardizem, transitioned to Amio) HS Troponin was elevated at 1282. Initial CXR   Showed Mild cardiomegaly with mild diffuse prominence of the parahilar interstitial markings, L>R,  Suggesting asymmetric mild pulmonary edema.She was initially very difficult to ventilate. Initial ABG 6.917/46/107/22. Pt's creatinine is 6.19, AST is 64, ALT is 37. PCCM were called to admit and manage patient care.   Pt. Does not have a HCPOA, She does not have an advanced Directive. Patient's family situation is such that her children do not want to make a decision regarding code status. Her sister stated she wanted her to remain a Full Code as the last time she was seen by oncology 2 weeks ago she was a full code.  Dr. McLake Bellspoke with the patient's sister, Caitlin Bambergerand the patient was made a DNR, after discussing multiple organ failures and underlying non-survivable metastatic breast cancer and her chronic pain..      Past Medical History   Past Medical History:  Diagnosis Date  . Anemia   . Anxiety   . [place]   breast cancer right  . Depression   . [place]  . GERD (gastroesophageal reflux disease)   . Headache   . History of radiation therapy 11/10/17- 12/08/17   right breast, 40.05 Gy in 15 fractions. Right breast boost 10 Gy in 5 fractions.   . Metastatic cancer  to bone ([place]) dx'd 09/2018   pelvis, spine and ribs  . Personal history of chemotherapy   . Personal history of radiation therapy      Significant Hospital Events   09/10/2019: Cardiac Arrest>> Admission to [facility]  Consults:  5/29 PCCM  Procedures:  5/29 ETT>>  Significant Diagnostic Tests:  09/10/2019 CXR Endotracheal tube and NG tube in good position. No change in the left perihilar infiltrate. 2. Previously demonstrated sclerotic bone metastasis in the anterior aspect of the left third rib. 3. No significant change since the prior study on the same date.  Micro Data:  5/29 SARS negative 5/29 Blood Cx>> 5/29 Urine Cx>>   Antimicrobials:  Zosyn x 1  5/29 Unasyn 5/29>>    Interim history/subjective:  Currently sedated and intubated with agonal respirations. Pt. Is hypotensive on pressors.    Objective   Blood pressure 115/68, pulse (!) 125, resp. rate (!) 21, weight 123.3 kg, SpO2 100 %.    Vent Mode: PRVC FiO2 (%):  [70 %-100 %] 70 % Set Rate:  [18 bmp] 18 bmp Vt Set:  [490 mL] 490 mL PEEP:  [5 cmH20] 5 cmH20 Plateau Pressure:  [20 cmH20] 20 cmH20  No intake or output data in the 24 hours ending 08/18/2019 1313 Filed Weights   08/15/2019 1138  Weight: 123.3 kg    Examination: General: Sedated and intubated, follows no commands HENT: NCAT, Oral ETT is secure, OG tuve with bilious drainage Lungs: Bilateral chest excursion, coarse throughout, diminished per bases, few crackles per bases.  Cardiovascular: S1, S2, Irr, No RMG, A fib with RVR per tele Abdomen: Soft , ND, NT, BS diminished per bases Extremities: No acute abnormalities, cool to touch Neuro: Sedated and intubated, paralytic for vent mechanics GU: Foley cath  Resolved Hospital Problem list     Assessment & Plan:  Post Cardiac Arrest/ Cardiogenic Shock Found Down with > 24 hour window to last known normal Elevated Troponin's and Lactate Hypotension Afib with RVR PH initially 6.97 Made DNR per sister Recent Craniotomy>> Metastatic breast cancer>> not candidate for cooling Plan No escalation of current level of care Trend Troponin Echo as ordered Start peripheral Levophed MAP goal > 65 Wean epi off and titrate Levo for above MAP goals Amiodarone gtt for rate control Trend Lactate Bicarb gtt at 100/hr ABG prn Check Cortisol level Supportive Care    Acute Respiratory Failure in setting of Cardiac Arrest Suspected Aspiration event  Vent dyssynchrony Agonal respiratory pattern despite intubation/ adequate sats Plan Titrate FiO2 and PEEP for sats > 92% VAP protocol CXR prn and in am Start Unasyn per Pharmacy Fentanyl and Versed prn  to maintain RASS  of -1 Rocuronium 100 mg x 1 for vent dyssynchrony Sputum for culture   Neutropenia in setting of biologic Therapy for Breast Cancer Afebrile [place] Plan Trend Fever and  CBC   Acute Renal Failure: Creatinine of 6.19 Last known Creatinine 5/12>> 1.05 Plan Trend BMET daily Replete electrolytes as needed  Monitor UO Avoid nephrotoxic medications Maintain renal perfusion  DM Hyperglycemia Plan CBG Q 4 SSI  AMS/ Suspected Anoxic Damage Unknown Down Time Posturing noted per Nursing in ED upon arrival Plan Supportive care  Chronic Pain in setting of metastatic breast Cancer  ( Brain/ Bone/? Lung) Plan Fentanyl gtt Versed prn Will add morphine if patient appears uncomfortable   Malignant neoplasm of the upper-outer quadrant of right breast, base of ductal carcinoma, pT2N0M0, stage IIA, grade 3, ER+ /PR +, HER2 -, stage IB, Oncotype  RS 29, metastatic bone disease in 10/2018, oligo brain met in 04/2019 Current Treatment includes Letrozole daily, Ibrance , Zoladex injections monthly  and Xgeva injectionsmonthly Plan Palliative Care consult for  comfort care  in am if patient survives the next 24 hours  Best practice:  Diet: NPO for now Pain/Anxiety/Delirium protocol (if indicated): Fentanyl gtt, PRN versed VAP protocol (if indicated): Initiated DVT prophylaxis: PAS GI prophylaxis: pepcid Glucose control: CBG Q 4, SSI Mobility:  BR Code Status:  DNR Family Communication: Sister Tomi Bamberger updated by Dr. [place] Cohen 5/29 Disposition: ICU  Labs   CBC: Recent Labs  Lab 09/08/2019 1135 09/10/2019 1200  WBC 3.1*  --   NEUTROABS 0.4*  --   HGB 8.3* 9.5*  HCT 27.8* 28.0*  MCV 107.8*  --   PLT 203  --     Basic Metabolic Panel: Recent Labs  Lab 09/13/2019 1135 09/12/2019 1200  NA 145 141  K 4.9 5.3*  CL 110  --   CO2 12*  --   GLUCOSE 244*  --   BUN 50*  --   CREATININE 6.19*  --   CALCIUM 7.1*  --   MG 1.9  --    GFR: Estimated Creatinine Clearance: 14.3 mL/min (A)  (by C-G formula based on SCr of 6.19 mg/dL (H)). Recent Labs  Lab 09/10/2019 1135 09/05/2019 1136  WBC 3.1*  --   LATICACIDVEN  --  >11.0*    Liver Function Tests: Recent Labs  Lab 08/14/2019 1135  AST 63*  ALT 37  ALKPHOS 40  BILITOT 0.4  PROT 5.2*  ALBUMIN 2.4*   No results for input(s): LIPASE, AMYLASE in the last 168 hours. No results for input(s): AMMONIA in the last 168 hours.  ABG    Component Value Date/Time   PHART 6.917 (LL) 09/01/2019 1200   PCO2ART 46.1 09/06/2019 1200   PO2ART 187 (H) 08/24/2019 1200   HCO3 9.4 (L) 09/10/2019 1200   TCO2 11 (L) 09/05/2019 1200   ACIDBASEDEF 22.0 (H) 09/06/2019 1200   O2SAT 98.0 09/13/2019 1200     Coagulation Profile: Recent Labs  Lab 09/01/2019 1135  INR 1.9*    Cardiac Enzymes: No results for input(s): CKTOTAL, CKMB, CKMBINDEX, TROPONINI in the last 168 hours.  HbA1C: Hgb A1c MFr Bld  Date/Time Value Ref Range Status  11/09/2018 02:47 PM 5.7 (H) 4.8 - 5.6 % Final    Comment:    (NOTE) Pre diabetes:          5.7%-6.4% Diabetes:              >6.4% Glycemic control for   <7.0% adults with diabetes   09/05/2017 01:31 PM 6.7 (H) 4.8 - 5.6 % Final    Comment:    (NOTE) Pre diabetes:          5.7%-6.4% Diabetes:              >6.4% Glycemic control for   <7.0% adults with diabetes     CBG: Recent Labs  Lab 08/22/2019 1109  GLUCAP 191*    Review of Systems:   Unable as patient is intubated and sedated, unresponsive  Past Medical History  She,  has a past medical history of Anemia, Anxiety, Cancer ([place]) (dx'd 06/2017), Depression, Diabetes mellitus without complication ([place]), GERD (gastroesophageal reflux disease), Headache, History of radiation therapy (11/10/17- 12/08/17), Metastatic cancer to bone ([place]) (dx'd 09/2018), Personal history of chemotherapy, and Personal history of radiation therapy.   Surgical History    Past Surgical  History:  Procedure Laterality Date  . APPLICATION OF CRANIAL NAVIGATION N/A  05/17/2019   Procedure: APPLICATION OF CRANIAL NAVIGATION;  Surgeon: Judith Part, MD;  Location: [facility];  Service: Neurosurgery;  Laterality: N/A;  . CERVICAL CONE BIOPSY    . CHOLECYSTECTOMY    . CRANIOTOMY N/A 05/17/2019   Procedure: Suboccipital craniotomy for tumor resection with brainlab;  Surgeon: Judith Part, MD;  Location: [facility];  Service: Neurosurgery;  Laterality: N/A;  posterior/suboccipital  . IR RADIOLOGIST EVAL & MGMT  11/03/2018  . KYPHOPLASTY N/A 11/12/2018   Procedure: LUMBAR TWO  BIOPSY AND LUMBAR TWO KYPHOPLASTY;  Surgeon: Melina Schools, MD;  Location: [facility];  Service: Orthopedics;  Laterality: N/A;  90 mins  . MASTECTOMY WITH RADIOACTIVE SEED GUIDED EXCISION AND AXILLARY SENTINEL LYMPH NODE BIOPSY Right 07/08/2017   Procedure: RIGHT LUMPTECTOMY WITH RADIOACTIVE SEED GUIDED EXCISION AND RIGHT AXILLARY SENTINEL LYMPH NODE BIOPSY ERAS PATHWAY;  Surgeon: [name], MD;  Location: [facility];  Service: General;  Laterality: Right;  PEC BLOCK  . OPEN REDUCTION INTERNAL FIXATION (ORIF) DISTAL RADIAL FRACTURE  03/17/2012   Procedure: OPEN REDUCTION INTERNAL FIXATION (ORIF) DISTAL RADIAL FRACTURE;  Surgeon: Roseanne Kaufman, MD;  Location: [facility];  Service: Orthopedics;  Laterality: Right;  Pre-operative a supra-clavical block right arm in addition to general anesthesia  . PORT-A-CATH REMOVAL Right 02/02/2018   Procedure: REMOVAL PORT-A-CATH;  Surgeon: [name], MD;  Location: [facility];  Service: General;  Laterality: Right;  . PORTACATH PLACEMENT N/A 07/30/2017   Procedure: INSERTION PORT-A-CATH WITH [place];  Surgeon: [name], MD;  Location: [facility];  Service: General;  Laterality: N/A;  . TUBAL LIGATION       Social History   reports that she has never smoked. She has never used smokeless tobacco. She reports current alcohol use. She reports that she does not use drugs.   Family History   Her family history includes Diabetes in her father;  Hypertension in her father and mother.   Allergies No Known Allergies   Home Medications  Prior to Admission medications   Medication Sig Start Date End Date Taking? Authorizing Provider  acetaminophen (TYLENOL) 325 MG tablet Take 650 mg by mouth every 6 (six) hours as needed for mild pain.    [provider]  blood glucose meter kit and supplies KIT Dispense based on patient and insurance preference. Use up to four times daily as directed. (FOR ICD-9 250.00, 250.01). 02/19/19   Truitt Merle, MD  dexamethasone (DECADRON) 4 MG tablet Take 1 tablet (4 mg total) by mouth 3 (three) times daily. On 3/24, start taking 1 tablet twice daily. On 4/3, start taking 1 tablet daily. On 4/16, start taking half a tablet daily, until you run out. Take with food. Patient not taking: Reported on 08/30/2019 06/30/19   [name], MD  gabapentin (NEURONTIN) 300 MG capsule Take 1-2 capsules (300-600 mg total) by mouth See admin instructions. Take 300 mg by mouth in the morning and afternoon and 600 mg at bedtime 06/28/19   [name], NP  ibuprofen (ADVIL) 200 MG tablet Take 400-600 mg by mouth every 6 (six) hours as needed for headache or mild pain.    [provider]  letrozole ([place]) 2.5 MG tablet TAKE 1 TABLET BY MOUTH EVERY DAY Patient taking differently: Take 2.5 mg by mouth daily.  04/30/19   Truitt Merle, MD  meclizine (ANTIVERT) 25 MG tablet Take 1 tablet (25 mg total) by mouth 3 (  three) times daily as needed for dizziness. Patient not taking: Reported on 08/30/2019 07/08/19   Sherwood Gambler, MD  metFORMIN (GLUCOPHAGE) 500 MG tablet Take 1 tablet (500 mg total) by mouth 2 (two) times daily with a meal. 07/27/19   Truitt Merle, MD  morphine (MS CONTIN) 15 MG 12 hr tablet Take 1 tablet (15 mg total) by mouth at bedtime. 08/25/19   Truitt Merle, MD  ondansetron (ZOFRAN) 8 MG tablet TAKE 1 TABLET (8 MG TOTAL) BY MOUTH EVERY 8 (EIGHT) HOURS AS NEEDED FOR NAUSEA OR VOMITING. 04/26/19   Truitt Merle, MD    oxyCODONE (OXY IR/ROXICODONE) 5 MG immediate release tablet Take 1 tablet (5 mg total) by mouth every 8 (eight) hours as needed for severe pain. 09/08/19   Truitt Merle, MD  palbociclib (IBRANCE) 125 MG tablet TAKE 1 TABLET (125 MG TOTAL) BY MOUTH DAILY. TAKE FOR 21 DAYS ON, 7 DAYS OFF, REPEAT EVERY 28 DAYS. 09/09/19   Truitt Merle, MD  polyethylene glycol (MIRALAX) 17 g packet Take 17 g by mouth daily as needed. 10/06/18   Robyn Haber, MD  potassium chloride SA (KLOR-CON M20) 20 MEQ tablet Take 1 tablet (20 mEq total) by mouth 2 (two) times daily. 06/22/19   [name], NP  prochlorperazine (COMPAZINE) 10 MG tablet TAKE 1 TABLET (10 MG TOTAL) BY MOUTH EVERY 6 (SIX) HOURS AS NEEDED FOR NAUSEA OR VOMITING. 08/23/19   Truitt Merle, MD     Critical care time: 15 minutes    Magdalen Spatz, MSN, AGACNP-BC [place] for personal pager PCCM on call pager [phone] 09/13/2019 3:04 PM

## 2019-09-12 ENCOUNTER — Inpatient Hospital Stay (HOSPITAL_COMMUNITY): Payer: Medicaid Other

## 2019-09-12 DIAGNOSIS — C787 Secondary malignant neoplasm of liver and intrahepatic bile duct: Secondary | ICD-10-CM

## 2019-09-12 DIAGNOSIS — B9561 Methicillin susceptible Staphylococcus aureus infection as the cause of diseases classified elsewhere: Secondary | ICD-10-CM

## 2019-09-12 DIAGNOSIS — R6521 Severe sepsis with septic shock: Secondary | ICD-10-CM

## 2019-09-12 DIAGNOSIS — I469 Cardiac arrest, cause unspecified: Secondary | ICD-10-CM

## 2019-09-12 DIAGNOSIS — C50919 Malignant neoplasm of unspecified site of unspecified female breast: Secondary | ICD-10-CM

## 2019-09-12 DIAGNOSIS — A419 Sepsis, unspecified organism: Secondary | ICD-10-CM

## 2019-09-12 DIAGNOSIS — C7951 Secondary malignant neoplasm of bone: Secondary | ICD-10-CM

## 2019-09-12 DIAGNOSIS — C7931 Secondary malignant neoplasm of brain: Secondary | ICD-10-CM

## 2019-09-12 LAB — BASIC METABOLIC PANEL
Anion gap: 19 — ABNORMAL HIGH (ref 5–15)
BUN: 58 mg/dL — ABNORMAL HIGH (ref 6–20)
CO2: 15 mmol/L — ABNORMAL LOW (ref 22–32)
Calcium: 6.2 mg/dL — CL (ref 8.9–10.3)
Chloride: 108 mmol/L (ref 98–111)
Creatinine, Ser: 5.65 mg/dL — ABNORMAL HIGH (ref 0.44–1.00)
GFR calc Af Amer: 9 mL/min — ABNORMAL LOW (ref >60)
GFR calc non Af Amer: 8 mL/min — ABNORMAL LOW (ref >60)
Glucose, Bld: 158 mg/dL — ABNORMAL HIGH (ref 70–99)
Potassium: 5.2 mmol/L — ABNORMAL HIGH (ref 3.5–5.1)
Sodium: 142 mmol/L (ref 135–145)

## 2019-09-12 LAB — BLOOD CULTURE ID PANEL (REFLEXED)

## 2019-09-12 LAB — URINE CULTURE: Culture: NO GROWTH

## 2019-09-12 LAB — GLUCOSE, CAPILLARY
Glucose-Capillary: 147 mg/dL — ABNORMAL HIGH (ref 70–99)
Glucose-Capillary: 149 mg/dL — ABNORMAL HIGH (ref 70–99)
Glucose-Capillary: 154 mg/dL — ABNORMAL HIGH (ref 70–99)
Glucose-Capillary: 162 mg/dL — ABNORMAL HIGH (ref 70–99)
Glucose-Capillary: 168 mg/dL — ABNORMAL HIGH (ref 70–99)
Glucose-Capillary: 182 mg/dL — ABNORMAL HIGH (ref 70–99)

## 2019-09-12 LAB — POCT I-STAT 7, (LYTES, BLD GAS, ICA,H+H)
Acid-base deficit: 8 mmol/L — ABNORMAL HIGH (ref 0.0–2.0)
Bicarbonate: 18.3 mmol/L — ABNORMAL LOW (ref 20.0–28.0)
Calcium, Ion: 0.9 mmol/L — ABNORMAL LOW (ref 1.15–1.40)
HCT: 37 % (ref 36.0–46.0)
Hemoglobin: 12.6 g/dL (ref 12.0–15.0)
O2 Saturation: 78 %
Patient temperature: 38.4
Potassium: 4.5 mmol/L (ref 3.5–5.1)
Sodium: 142 mmol/L (ref 135–145)
TCO2: 20 mmol/L — ABNORMAL LOW (ref 22–32)
pCO2 arterial: 41.9 mmHg (ref 32.0–48.0)
pH, Arterial: 7.256 — ABNORMAL LOW (ref 7.350–7.450)
pO2, Arterial: 53 mmHg — ABNORMAL LOW (ref 83.0–108.0)

## 2019-09-12 LAB — HEPATIC FUNCTION PANEL
ALT: 42 U/L (ref 0–44)
AST: 78 U/L — ABNORMAL HIGH (ref 15–41)
Albumin: 1.9 g/dL — ABNORMAL LOW (ref 3.5–5.0)
Alkaline Phosphatase: 43 U/L (ref 38–126)
Bilirubin, Direct: 0.1 mg/dL (ref 0.0–0.2)
Indirect Bilirubin: 0.1 mg/dL — ABNORMAL LOW (ref 0.3–0.9)
Total Bilirubin: 0.2 mg/dL — ABNORMAL LOW (ref 0.3–1.2)
Total Protein: 4.9 g/dL — ABNORMAL LOW (ref 6.5–8.1)

## 2019-09-12 LAB — CBC
HCT: 30.7 % — ABNORMAL LOW (ref 36.0–46.0)
Hemoglobin: 9.8 g/dL — ABNORMAL LOW (ref 12.0–15.0)
MCH: 32.5 pg (ref 26.0–34.0)
MCHC: 31.9 g/dL (ref 30.0–36.0)
MCV: 101.7 fL — ABNORMAL HIGH (ref 80.0–100.0)
Platelets: 109 10*3/uL — ABNORMAL LOW (ref 150–400)
RBC: 3.02 MIL/uL — ABNORMAL LOW (ref 3.87–5.11)
RDW: 19.3 % — ABNORMAL HIGH (ref 11.5–15.5)
WBC: 1.4 10*3/uL — CL (ref 4.0–10.5)
nRBC: 5.7 % — ABNORMAL HIGH (ref 0.0–0.2)

## 2019-09-12 LAB — HEMOGLOBIN A1C
Hgb A1c MFr Bld: 6 % — ABNORMAL HIGH (ref 4.8–5.6)
Mean Plasma Glucose: 125.5 mg/dL

## 2019-09-12 LAB — PHOSPHORUS: Phosphorus: 4.9 mg/dL — ABNORMAL HIGH (ref 2.5–4.6)

## 2019-09-12 LAB — ECHOCARDIOGRAM COMPLETE: Weight: 4349.23 oz

## 2019-09-12 LAB — MAGNESIUM: Magnesium: 1.4 mg/dL — ABNORMAL LOW (ref 1.7–2.4)

## 2019-09-12 MED ORDER — SODIUM BICARBONATE-DEXTROSE 150-5 MEQ/L-% IV SOLN
150.0000 meq | INTRAVENOUS | Status: DC
  Administered 2019-09-12 – 2019-09-14 (×6): 150 meq via INTRAVENOUS
  Filled 2019-09-12 (×7): qty 1000

## 2019-09-12 MED ORDER — FENTANYL 2500MCG IN NS 250ML (10MCG/ML) PREMIX INFUSION
0.0000 ug/h | INTRAVENOUS | Status: DC
  Administered 2019-09-13: 400 ug/h via INTRAVENOUS
  Administered 2019-09-13: 200 ug/h via INTRAVENOUS
  Administered 2019-09-14: 150 ug/h via INTRAVENOUS
  Administered 2019-09-14: 175 ug/h via INTRAVENOUS
  Filled 2019-09-12 (×4): qty 250

## 2019-09-12 MED ORDER — FENTANYL CITRATE (PF) 100 MCG/2ML IJ SOLN: 50.0000 ug | Freq: Once | INTRAMUSCULAR | Status: DC

## 2019-09-12 MED ORDER — DOCUSATE SODIUM 50 MG/5ML PO LIQD
100.0000 mg | Freq: Two times a day (BID) | ORAL | Status: DC
  Administered 2019-09-12: 100 mg via ORAL
  Filled 2019-09-12: qty 10

## 2019-09-12 MED ORDER — POLYETHYLENE GLYCOL 3350 17 G PO PACK
17.0000 g | PACK | Freq: Every day | ORAL | Status: DC
  Administered 2019-09-13: 17 g
  Filled 2019-09-12: qty 1

## 2019-09-12 MED ORDER — POLYETHYLENE GLYCOL 3350 17 G PO PACK: 17.0000 g | PACK | Freq: Every day | ORAL | Status: DC | PRN

## 2019-09-12 MED ORDER — FENTANYL BOLUS VIA INFUSION
50.0000 ug | INTRAVENOUS | Status: DC | PRN
  Filled 2019-09-12: qty 50

## 2019-09-12 MED ORDER — PERFLUTREN LIPID MICROSPHERE
1.0000 mL | INTRAVENOUS | Status: AC | PRN
  Administered 2019-09-12: 4 mL via INTRAVENOUS
  Filled 2019-09-12: qty 10

## 2019-09-12 MED ORDER — DOCUSATE SODIUM 50 MG/5ML PO LIQD
100.0000 mg | Freq: Two times a day (BID) | ORAL | Status: DC
  Administered 2019-09-12 – 2019-09-13 (×2): 100 mg
  Filled 2019-09-12 (×2): qty 10

## 2019-09-12 MED ORDER — ACETAMINOPHEN 160 MG/5ML PO SOLN
325.0000 mg | Freq: Four times a day (QID) | ORAL | Status: DC | PRN
  Administered 2019-09-12 – 2019-09-13 (×2): 325 mg
  Filled 2019-09-12 (×2): qty 20.3

## 2019-09-12 MED ORDER — POLYETHYLENE GLYCOL 3350 17 G PO PACK
17.0000 g | PACK | Freq: Every day | ORAL | Status: DC
  Administered 2019-09-12: 17 g via ORAL
  Filled 2019-09-12: qty 1

## 2019-09-12 MED ORDER — CALCIUM GLUCONATE-NACL 1-0.675 GM/50ML-% IV SOLN
1.0000 g | Freq: Once | INTRAVENOUS | Status: AC
  Administered 2019-09-12: 1000 mg via INTRAVENOUS
  Filled 2019-09-12: qty 50

## 2019-09-12 MED ORDER — MIDAZOLAM HCL 2 MG/2ML IJ SOLN
2.0000 mg | INTRAMUSCULAR | Status: DC | PRN
  Administered 2019-09-13: 2 mg via INTRAVENOUS
  Filled 2019-09-12 (×3): qty 2

## 2019-09-12 MED ORDER — MIDAZOLAM HCL 2 MG/2ML IJ SOLN
2.0000 mg | INTRAMUSCULAR | Status: DC | PRN
  Administered 2019-09-13 (×3): 2 mg via INTRAVENOUS
  Filled 2019-09-12 (×3): qty 2

## 2019-09-12 MED ORDER — CHLORHEXIDINE GLUCONATE CLOTH 2 % EX PADS
6.0000 | MEDICATED_PAD | Freq: Every day | CUTANEOUS | Status: DC
  Administered 2019-09-13 – 2019-09-14 (×3): 6 via TOPICAL

## 2019-09-12 NOTE — Progress Notes (Addendum)
elink nurse Jody called to validate md aware of pt abg and magnesium results. No new treatments needed at this time per md.
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
## 2019-09-12 NOTE — Progress Notes (Signed)
PHARMACY - PHYSICIAN COMMUNICATION CRITICAL VALUE ALERT - BLOOD CULTURE IDENTIFICATION (BCID)  Caitlin Cohen is an 54 y.o. female who presented to [facility] on 08/15/2019 with a chief complaint of CPR  Assessment:  Lab called and 1/4 blood culture bottles with CONS - likely contaminant  Name of physician (or Provider) Contacted: [place]  Current antibiotics: None  Changes to prescribed antibiotics recommended:  Pt is comfort care so no changes needed.  Results for orders placed or performed during the hospital encounter of 09/07/2019  Blood Culture ID Panel (Reflexed) (Collected: 08/19/2019 11:46 AM)  Result Value Ref Range   Enterococcus species NOT DETECTED NOT DETECTED   Listeria monocytogenes NOT DETECTED NOT DETECTED   Staphylococcus species DETECTED (A) NOT DETECTED   Staphylococcus aureus (BCID) DETECTED (A) NOT DETECTED   Methicillin resistance NOT DETECTED NOT DETECTED   Streptococcus species NOT DETECTED NOT DETECTED   Streptococcus agalactiae NOT DETECTED NOT DETECTED   Streptococcus pneumoniae NOT DETECTED NOT DETECTED   Streptococcus pyogenes NOT DETECTED NOT DETECTED   Acinetobacter baumannii NOT DETECTED NOT DETECTED   Enterobacteriaceae species NOT DETECTED NOT DETECTED   Enterobacter cloacae complex NOT DETECTED NOT DETECTED   Escherichia coli NOT DETECTED NOT DETECTED   Klebsiella oxytoca NOT DETECTED NOT DETECTED   Klebsiella pneumoniae NOT DETECTED NOT DETECTED   Proteus species NOT DETECTED NOT DETECTED   Serratia marcescens NOT DETECTED NOT DETECTED   Haemophilus influenzae NOT DETECTED NOT DETECTED   Neisseria meningitidis NOT DETECTED NOT DETECTED   Pseudomonas aeruginosa NOT DETECTED NOT DETECTED   Candida albicans NOT DETECTED NOT DETECTED   Candida glabrata NOT DETECTED NOT DETECTED   Candida krusei NOT DETECTED NOT DETECTED   Candida parapsilosis NOT DETECTED NOT DETECTED   Candida tropicalis NOT DETECTED NOT DETECTED    Sherlon Handing, PharmD,  BCPS Please see amion for complete clinical pharmacist phone list 09/12/2019  6:17 AM

## 2019-09-12 NOTE — Progress Notes (Signed)
NAME:  Caitlin Cohen, MRN:  [id number], DOB:  1966-01-17, LOS: 1 ADMISSION DATE:  08/15/2019, CONSULTATION DATE:  09/09/2019 REFERRING MD:  Ralene Bathe, CHIEF COMPLAINT:  Cardiac Arrest with ROSC/ Metastatic Breast Cancer with brain mets  Brief History   54 year old female with history of metastatic breast cancer , with mets to bone, brain and liver, Pt.  Presents to the ED 09/01/2019 post cardiac arrest with ROSC in the field. Last known normal 5/28 at 12 noon. She was found pulseless at home by family 5/29 am who stated she was surrounded by cocaine and emesis when they found her. She was treated be EMS  with 4 Epi's, defib x 1, SVT episode - cardioverted x 2, given Amio 458m, for return of pulses.and spontaneous RR.  Then five  min prior to ED arrival, patient lost  Pulses, and was  given another Epi, and Narcan.   She was intubated in the ED, and treatment was initiated for hypotension, acidosis, lactate > 11, maroon OG drainage  and a fib with RVR. PCCM were called to admit and manage care.    Past Medical History   Past Medical History:  Diagnosis Date  . Anemia   . Anxiety   . [place] 06[place]2017   breast cancer right  . Depression   . Diabetes mellitus without complication ([place]   . GERD (gastroesophageal reflux disease)   . Headache   . History of radiation therapy 11/10/17- 12/08/17   right breast, 40.05 Gy in 15 fractions. Right breast boost 10 Gy in 5 fractions.   . Metastatic cancer to bone ([place] dx'd 09/2018   pelvis, spine and ribs  . Personal history of chemotherapy   . Personal history of radiation therapy      Significant Hospital Events   09/10/2019: Cardiac Arrest>> Admission to [facility]  Consults:  5/29 PCCM  Procedures:  5/29 ETT>>  Significant Diagnostic Tests:  5/30 UDS> cocaine, opiate positive 5/30 TTE> mild LVH, LVEF 619-62% RV systolic function normal, RV size normal, elevated PA pressure,   Micro Data:  5/29 SARS negative 5/29 Blood Cx>> MSSA  5/29  Urine Cx>>   Antimicrobials:  Zosyn x 1 5/29 Unasyn 5/29>>    Interim history/subjective:   No meaningful response to external stimuli Remains on vasopressors MSSA in blood Family coming today or tomorrow  Objective   Blood pressure (!) 104/47, pulse 90, temperature (!) 101.5 F (38.6 C), resp. rate (!) 21, weight 123.3 kg, SpO2 97 %.    Vent Mode: PRVC FiO2 (%):  [40 %-100 %] 40 % Set Rate:  [18 bmp] 18 bmp Vt Set:  [480 mL] 480 mL PEEP:  [5 cmH20] 5 cmH20 Plateau Pressure:  [9 cmH20-20 cmH20] 17 cmH20   Intake/Output Summary (Last 24 hours) at 09/12/2019 1412 Last data filed at 09/12/2019 1300 Gross per 24 hour  Intake 4889.54 ml  Output 250 ml  Net 4639.54 ml   Filed Weights   09/05/2019 1138  Weight: 123.3 kg    Examination:  General:  In bed on vent HENT: NCAT ETT in place PULM: CTA B, vent supported breathing CV: RRR, no mgr GI: BS+, soft, nontender MSK: normal bulk and tone Neuro: GCS 3, no motor response to pain, no eye opening    Resolved Hospital Problem list     Assessment & Plan:  Post Cardiac Arrest/ Cardiogenic Shock due to septic shock from MSSA bacteremia all present on admission Continue levophed, do not increase to second  vasopressor Continue Unasyn Tele No escalation of care  Acute metabolic encephalopathy, worrisome for anoxic brain injury Vent dyssynchrony, need for sedation Chronic pain in setting of breast cancer, metastatic disease with multiple bone mets Hold sedatives as able Minimize fentanyl, only use as needed to maintain ventilator synchrony PAD protocol, RASS target 0   Demand ischemia Atrial fib with RVR Tele Continue supportive care Amiodarone Stop dilt  Acute Respiratory Failure in setting of Cardiac Arrest Aspiration pneumonitis Full mechanical vent support VAP prevention Daily WUA/SBT   Neutropenia in setting of biologic Therapy for Breast Cancer Monitor CBC   Acute Renal Failure: Creatinine of  6.19 Last known Creatinine 5/12>> 1.05 Plan Continue supportive care Monitor BMET and UOP Replace electrolytes as needed  DM Hyperglycemia Plan CBG Q4 SSI  AMS/ Suspected Anoxic Damage Unknown Down Time Posturing noted per Nursing in ED upon arrival Supportive care  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  No escalation if worsens.  DNR if cardiac arrest.  Best practice:  Diet: NPO Pain/Anxiety/Delirium protocol (if indicated): Fentanyl gtt, PRN versed VAP protocol (if indicated): Initiated DVT prophylaxis: PAS GI prophylaxis: pepcid Glucose control: CBG Q 4, SSI Mobility:  BR Code Status:  DNR Family Communication: Daughter updated bedside on 5/30 Disposition: ICU  Labs   CBC: Recent Labs  Lab 08/20/2019 1135 08/29/2019 1200 09/12/19 0202 09/12/19 0410  WBC 3.1*  --  1.4*  --   NEUTROABS 0.4*  --   --   --   HGB 8.3* 9.5* 9.8* 12.6  HCT 27.8* 28.0* 30.7* 37.0  MCV 107.8*  --  101.7*  --   PLT 203  --  109*  --     Basic Metabolic Panel: Recent Labs  Lab 08/30/2019 1135 08/18/2019 1200 09/04/2019 1431 09/12/19 0202 09/12/19 0410  NA 145 141  --  142 142  K 4.9 5.3*  --  5.2* 4.5  CL 110  --   --  108  --   CO2 12*  --   --  15*  --   GLUCOSE 244*  --   --  158*  --   BUN 50*  --   --  58*  --   CREATININE 6.19*  --   --  5.65*  --   CALCIUM 7.1*  --   --  6.2*  --   MG 1.9  --  1.7 1.4*  --   PHOS  --   --  6.5* 4.9*  --    GFR: Estimated Creatinine Clearance: 15.7 mL/min (A) (by C-G formula based on SCr  of 5.65 mg/dL (H)). Recent Labs  Lab 08/20/2019 1135 08/15/2019 1136 08/16/2019 1431 09/12/19 0202  WBC 3.1*  --   --  1.4*  LATICACIDVEN  --  >11.0* 9.5*  --     Liver Function Tests: Recent Labs  Lab 09/05/2019 1135  AST 63*  ALT 37  ALKPHOS 40  BILITOT 0.4  PROT 5.2*  ALBUMIN 2.4*   No results for input(s): LIPASE, AMYLASE in the last 168 hours. No results for input(s):  AMMONIA in the last 168 hours.  ABG    Component Value Date/Time   PHART 7.256 (L) 09/12/2019 0410   PCO2ART 41.9 09/12/2019 0410   PO2ART 53 (L) 09/12/2019 0410   HCO3 18.3 (L) 09/12/2019 0410   TCO2 20 (L) 09/12/2019 0410   ACIDBASEDEF 8.0 (H) 09/12/2019 0410   O2SAT 78.0 09/12/2019 0410     Coagulation Profile: Recent Labs  Lab 08/31/2019 1135  INR 1.9*    Cardiac Enzymes: No results for input(s): CKTOTAL, CKMB, CKMBINDEX, TROPONINI in the last 168 hours.  HbA1C: Hgb A1c MFr Bld  Date/Time Value Ref Range Status  08/23/2019 07:51 PM 6.0 (H) 4.8 - 5.6 % Final    Comment:    (NOTE) Pre diabetes:          5.7%-6.4% Diabetes:              >6.4% Glycemic control for   <7.0% adults with diabetes   11/09/2018 02:47 PM 5.7 (H) 4.8 - 5.6 % Final    Comment:    (NOTE) Pre diabetes:          5.7%-6.4% Diabetes:              >6.4% Glycemic control for   <7.0% adults with diabetes     CBG: Recent Labs  Lab 09/02/2019 2018 09/12/19 0029 09/12/19 0422 09/12/19 0828 09/12/19 1152  GLUCAP 169* 147* 168* 182* 162*     Critical care time: 35 minutes    Roselie Awkward, MD [place] PCCM Pager: [phone] Cell: [phone] If no response, call [phone]  09/12/2019 2:12 PM

## 2019-09-12 NOTE — Consult Note (Addendum)
Date of Admission:  08/30/2019          Reason for Consult: MSSA bacteremia with severe septic shock And multi organ failure   Referring Provider: Terrilyn Saver auto consult   Assessment:  1. MSSA bacteremia with severe septic shock with multiorgan failure 2. Cardiac arrest with prolonged CPR 3. Metastatic breast cancer that is not treat-able at this point  Plan:  1. OK to continue unasyn for now 2. TTE ordered 3. I am repeating blood cultures 4. I would STRONGLY recommend pivot to pure palliative care  I will sign off for now, but IF patient improves and there is desire for LONG term antibiotics then please call us back.  Active Problems:   Cardiac arrest ([place])   Acute respiratory failure with hypoxemia (HCC)   Scheduled Meds: . Chlorhexidine Gluconate Cloth  6 each Topical Daily  . insulin aspart  0-15 Units Subcutaneous Q4H   Continuous Infusions: . amiodarone 30 mg/hr (09/12/19 0900)  . ampicillin-sulbactam (UNASYN) IV Stopped (09/12/19 0825)  . diltiazem (CARDIZEM) infusion 10 mg/hr (09/12/19 0900)  . epinephrine Stopped (08/29/2019 1314)  . famotidine (PEPCID) IV 20 mg (09/12/19 0934)  . fentaNYL infusion INTRAVENOUS 25 mcg/hr (09/12/19 0935)  . norepinephrine (LEVOPHED) Adult infusion 25 mcg/min (09/12/19 1259)  . sodium bicarbonate 150 mEq in dextrose 5% 1000 mL 150 mEq (09/12/19 1157)   PRN Meds:.acetaminophen (TYLENOL) oral liquid 160 mg/5 mL, albuterol, docusate sodium, fentaNYL, midazolam, midazolam, ondansetron (ZOFRAN) IV, perflutren lipid microspheres (DEFINITY) IV suspension, polyethylene glycol  HPI: Caitlin Cohen is a 54 y.o. female has metastatic breast cancer first diagnosed in 2019 who was brought in to the [facility] emergency department today with CPR in progress.  She was last seen normal on May 28 around lunchtime.  She was found unresponsive by family on May 29 and 911 was called.  She was noted to be pulseless on EMS arrival CPR was started and  she was brought in while undergoing resuscitation via standard ACLS protocol.  She received multiple rounds of epinephrine, some cardioversion attempts, was orally intubated after a nasal intubation and witnessed aspiration.  In our emergency department she is in significant shock requiring Levophed, her creatinine is 6, lactic acid was greater than 11, she is profoundly hypoxemic and she is in atrial fibrillation with RVR.   records from her most recent oncology visit it was made clear that treatment goals at this point are only palliative.  She has been admitted to the intensive care unit.  She is on pressors.  Blood cultures on admission are growing methicillin sensitive Staph aureus on BC ID  I agree with narrowing her to Unasyn.  I will repeat blood cultures she has a 2D echocardiogram ordered.  I think it is clear that a pivot to pure palliative care is in order.  I will sign off for now and will step in the background.  If the patient improves and aggressive treatment of her infection is desired we will be happy to reengage.     Review of Systems: Review of Systems  Unable to perform ROS: Critical illness    Past Medical History:  Diagnosis Date  . Anemia   . Anxiety   . Cancer ([place]) dx'd 06/2017   breast cancer right  . Depression   . Diabetes mellitus without complication ([place])   . GERD (gastroesophageal reflux disease)   . Headache   . History of radiation therapy 11/10/17- 12/08/17   right breast, 40.05  Gy in 15 fractions. Right breast boost 10 Gy in 5 fractions.   . Metastatic cancer to bone ([place]) dx'd 09/2018   pelvis, spine and ribs  . Personal history of chemotherapy   . Personal history of radiation therapy     Social History   Tobacco Use  . Smoking status: Never Smoker  . Smokeless tobacco: Never Used  Substance Use Topics  . Alcohol use: Yes    Comment: occassionally  . Drug use: No    Family History  Problem Relation Age of Onset  . Hypertension  Mother   . Diabetes Father   . Hypertension Father    No Known Allergies  OBJECTIVE: Blood pressure (!) 104/47, pulse 90, temperature (!) 101.5 F (38.6 C), resp. rate (!) 21, weight 123.3 kg, SpO2 97 %.  Physical Exam Constitutional:      Appearance: She is ill-appearing and toxic-appearing.     Interventions: She is intubated.  HENT:     Head: Normocephalic and atraumatic.  Cardiovascular:     Rate and Rhythm: Tachycardia present. Rhythm irregular.     Heart sounds: No murmur. No friction rub. No gallop.   Pulmonary:     Effort: She is intubated.  Abdominal:     General: Bowel sounds are normal. There is no distension.     Palpations: There is no mass.  Neurological:     General: No focal deficit present.     Mental Status: She is unresponsive.     Lab Results Lab Results  Component Value Date   WBC 1.4 (LL) 09/12/2019   HGB 12.6 09/12/2019   HCT 37.0 09/12/2019   MCV 101.7 (H) 09/12/2019   PLT 109 (L) 09/12/2019    Lab Results  Component Value Date   CREATININE 5.65 (H) 09/12/2019   BUN 58 (H) 09/12/2019   NA 142 09/12/2019   K 4.5 09/12/2019   CL 108 09/12/2019   CO2 15 (L) 09/12/2019    Lab Results  Component Value Date   ALT 37 08/14/2019   AST 63 (H) 09/02/2019   ALKPHOS 40 08/29/2019   BILITOT 0.4 08/29/2019     Microbiology: Recent Results (from the past 240 hour(s))  SARS Coronavirus 2 by RT PCR (hospital order, performed in [place] hospital lab) Nasopharyngeal Nasopharyngeal Swab     Status: None   Collection Time: 09/03/2019 11:33 AM   Specimen: Nasopharyngeal Swab  Result Value Ref Range Status   SARS Coronavirus 2 NEGATIVE NEGATIVE Final    Comment: (NOTE) SARS-CoV-2 target nucleic acids are NOT DETECTED. The SARS-CoV-2 RNA is generally detectable in upper and lower respiratory specimens during the acute phase of infection. The lowest concentration of SARS-CoV-2 viral copies this assay can detect is 250 copies / mL. A negative result  does not preclude SARS-CoV-2 infection and should not be used as the sole basis for treatment or other patient management decisions.  A negative result may occur with improper specimen collection / handling, submission of specimen other than nasopharyngeal swab, presence of viral mutation(s) within the areas targeted by this assay, and inadequate number of viral copies (<250 copies / mL). A negative result must be combined with clinical observations, patient history, and epidemiological information. Fact Sheet for Patients:   StrictlyIdeas.no Fact Sheet for Healthcare Providers: BankingDealers.co.za This test is not yet approved or cleared  by the [place] FDA and has been authorized for detection and/or diagnosis of SARS-CoV-2 by FDA under an Emergency Use Authorization (EUA).  This EUA  will remain in effect (meaning this test can be used) for the duration of the COVID-19 declaration under Section 564(b)(1) of the Act, 21 U.S.C. section 360bbb-3(b)(1), unless the authorization is terminated or revoked sooner. Performed at [facility], [place] [address], [place], [place] [address]   Culture, blood (routine x 2)     Status: None (Preliminary result)   Collection Time: 09/02/2019 11:42 AM   Specimen: BLOOD LEFT WRIST  Result Value Ref Range Status   Specimen Description BLOOD LEFT WRIST  Final   Special Requests   Final    BOTTLES DRAWN AEROBIC AND ANAEROBIC Blood Culture results may not be optimal due to an inadequate volume of blood received in culture bottles   Culture   Final    NO GROWTH < 24 HOURS Performed at [facility], [place] [address], [place], [place] [address]    Report Status PENDING  Incomplete  Culture, blood (routine x 2)     Status: None (Preliminary result)   Collection Time: 08/31/2019 11:46 AM   Specimen: BLOOD LEFT HAND  Result Value Ref Range Status   Specimen Description BLOOD LEFT HAND  Final    Special Requests   Final    BOTTLES DRAWN AEROBIC AND ANAEROBIC Blood Culture adequate volume   Culture  Setup Time   Final    GRAM POSITIVE COCCI IN CLUSTERS IN BOTH AEROBIC AND ANAEROBIC BOTTLES CRITICAL RESULT CALLED TO, READ BACK BY AND VERIFIED WITH: PHARMD AMEND C. AT [place] 09/12/2019 Performed at [facility], [place] [address] [place], [place] [address]    Culture GRAM POSITIVE COCCI  Final   Report Status PENDING  Incomplete  Blood Culture ID Panel (Reflexed)     Status: Abnormal   Collection Time: 09/10/2019 11:46 AM  Result Value Ref Range Status   Enterococcus species NOT DETECTED NOT DETECTED Final   Listeria monocytogenes NOT DETECTED NOT DETECTED Final   Staphylococcus species DETECTED (A) NOT DETECTED Final    Comment: CRITICAL RESULT CALLED TO, READ BACK BY AND VERIFIED WITH: PHARMD AMEND C. AT 0612 BY MESSAN H. ON 09/12/2019    Staphylococcus aureus (BCID) DETECTED (A) NOT DETECTED Final    Comment: Methicillin (oxacillin) susceptible Staphylococcus aureus (MSSA). Preferred therapy is anti staphylococcal beta lactam antibiotic (Cefazolin or Nafcillin), unless clinically contraindicated. CRITICAL RESULT CALLED TO, READ BACK BY AND VERIFIED WITH: PHARMD AMEND C. AT [place] ON 09/12/2019    Methicillin resistance NOT DETECTED NOT DETECTED Final   Streptococcus species NOT DETECTED NOT DETECTED Final   Streptococcus agalactiae NOT DETECTED NOT DETECTED Final   Streptococcus pneumoniae NOT DETECTED NOT DETECTED Final   Streptococcus pyogenes NOT DETECTED NOT DETECTED Final   Acinetobacter baumannii NOT DETECTED NOT DETECTED Final   Enterobacteriaceae species NOT DETECTED NOT DETECTED Final   Enterobacter cloacae complex NOT DETECTED NOT DETECTED Final   Escherichia coli NOT DETECTED NOT DETECTED Final   Klebsiella oxytoca NOT DETECTED NOT DETECTED Final   Klebsiella pneumoniae NOT DETECTED NOT DETECTED Final   Proteus species NOT DETECTED NOT  DETECTED Final   Serratia marcescens NOT DETECTED NOT DETECTED Final   Haemophilus influenzae NOT DETECTED NOT DETECTED Final   Neisseria meningitidis NOT DETECTED NOT DETECTED Final   Pseudomonas aeruginosa NOT DETECTED NOT DETECTED Final   Candida albicans NOT DETECTED NOT DETECTED Final   Candida glabrata NOT DETECTED NOT DETECTED Final   Candida krusei NOT DETECTED NOT DETECTED Final  Candida parapsilosis NOT DETECTED NOT DETECTED Final   Candida tropicalis NOT DETECTED NOT DETECTED Final    Comment: Performed at [facility], [place] [address], [place], [place] [address]  Urine culture     Status: None   Collection Time: 08/30/2019  1:48 PM   Specimen: Urine, Random  Result Value Ref Range Status   Specimen Description URINE, RANDOM  Final   Special Requests NONE  Final   Culture   Final    NO GROWTH Performed at [facility], [place] [address] [place], [place] [address]    Report Status 09/12/2019 FINAL  Final  MRSA PCR Screening     Status: None   Collection Time: 09/07/2019  5:09 PM   Specimen: Nasal Mucosa; Nasopharyngeal  Result Value Ref Range Status   MRSA by PCR NEGATIVE NEGATIVE Final    Comment:        The GeneXpert MRSA Assay (FDA approved for NASAL specimens only), is one component of a comprehensive MRSA colonization surveillance program. It is not intended to diagnose MRSA infection nor to guide or monitor treatment for MRSA infections. Performed at [facility], [place] [address], [place], [place] [address]     Alcide Evener, [place] [facility] [phone] pager  09/12/2019, 1:08 PM

## 2019-09-12 NOTE — Progress Notes (Signed)
Echocardiogram 2D Echocardiogram has been performed.  Caitlin Cohen 09/12/2019, 12:06 PM

## 2019-09-12 NOTE — Progress Notes (Addendum)
CRITICAL VALUE ALERT  Critical Value:  WBC 1.4 also Calcium 6.2  Date & Time Notied:  09/12/2019 @ [id number]  Provider Notified: Warren Lacy MD, [place]  Orders Received/Actions taken: Bedside RN, Shirlean Mylar, also made aware.

## 2019-09-12 NOTE — Progress Notes (Signed)
eLink Physician-Brief Progress Note Patient Name: Caitlin Cohen DOB: 03-Sep-1965 MRN: [id number]   Date of Service  09/12/2019  HPI/Events of Note  Hypocalcemia with corrected level of 7.4  eICU Interventions  Repletion ordered     Intervention Category Intermediate Interventions: Electrolyte abnormality - evaluation and management  Margaretmary Lombard 09/12/2019, 3:04 AM

## 2019-09-12 NOTE — Progress Notes (Signed)
elink nurse [place] notified pt abg results reflect acidosis. Urine output of 50cc also reported. Jeral Fruit will notify md of abg results in addition to magnesium results of 1.4.

## 2019-09-13 ENCOUNTER — Inpatient Hospital Stay (HOSPITAL_COMMUNITY): Payer: Medicaid Other

## 2019-09-13 LAB — GLUCOSE, CAPILLARY
Glucose-Capillary: 107 mg/dL — ABNORMAL HIGH (ref 70–99)
Glucose-Capillary: 111 mg/dL — ABNORMAL HIGH (ref 70–99)
Glucose-Capillary: 126 mg/dL — ABNORMAL HIGH (ref 70–99)
Glucose-Capillary: 131 mg/dL — ABNORMAL HIGH (ref 70–99)
Glucose-Capillary: 137 mg/dL — ABNORMAL HIGH (ref 70–99)
Glucose-Capillary: 158 mg/dL — ABNORMAL HIGH (ref 70–99)
Glucose-Capillary: 92 mg/dL (ref 70–99)

## 2019-09-13 LAB — MAGNESIUM: Magnesium: 1.3 mg/dL — ABNORMAL LOW (ref 1.7–2.4)

## 2019-09-13 LAB — BASIC METABOLIC PANEL
Anion gap: 16 — ABNORMAL HIGH (ref 5–15)
BUN: 67 mg/dL — ABNORMAL HIGH (ref 6–20)
CO2: 20 mmol/L — ABNORMAL LOW (ref 22–32)
Calcium: 4.7 mg/dL — CL (ref 8.9–10.3)
Chloride: 104 mmol/L (ref 98–111)
Creatinine, Ser: 5.96 mg/dL — ABNORMAL HIGH (ref 0.44–1.00)
GFR calc Af Amer: 9 mL/min — ABNORMAL LOW (ref >60)
GFR calc non Af Amer: 7 mL/min — ABNORMAL LOW (ref >60)
Glucose, Bld: 128 mg/dL — ABNORMAL HIGH (ref 70–99)
Potassium: 5.1 mmol/L (ref 3.5–5.1)
Sodium: 140 mmol/L (ref 135–145)

## 2019-09-13 LAB — LEGIONELLA PNEUMOPHILA SEROGP 1 UR AG: L. pneumophila Serogp 1 Ur Ag: NEGATIVE

## 2019-09-13 MED ORDER — AMIODARONE IV BOLUS ONLY 150 MG/100ML: 150.0000 mg | Freq: Once | INTRAVENOUS | Status: DC

## 2019-09-13 MED ORDER — FUROSEMIDE 10 MG/ML IJ SOLN
40.0000 mg | Freq: Two times a day (BID) | INTRAMUSCULAR | Status: DC
  Administered 2019-09-13 – 2019-09-14 (×3): 40 mg via INTRAVENOUS
  Filled 2019-09-13 (×3): qty 4

## 2019-09-13 MED ORDER — MAGNESIUM SULFATE 2 GM/50ML IV SOLN
2.0000 g | Freq: Once | INTRAVENOUS | Status: AC
  Administered 2019-09-13: 2 g via INTRAVENOUS
  Filled 2019-09-13: qty 50

## 2019-09-13 MED ORDER — ORAL CARE MOUTH RINSE
15.0000 mL | OROMUCOSAL | Status: DC
  Administered 2019-09-13 – 2019-09-14 (×16): 15 mL via OROMUCOSAL

## 2019-09-13 MED ORDER — CALCIUM GLUCONATE-NACL 2-0.675 GM/100ML-% IV SOLN
2.0000 g | Freq: Once | INTRAVENOUS | Status: AC
  Administered 2019-09-13: 2000 mg via INTRAVENOUS
  Filled 2019-09-13: qty 100

## 2019-09-13 MED ORDER — FUROSEMIDE 10 MG/ML IJ SOLN
40.0000 mg | Freq: Once | INTRAMUSCULAR | Status: AC
  Administered 2019-09-13: 40 mg via INTRAVENOUS
  Filled 2019-09-13: qty 4

## 2019-09-13 MED ORDER — AMIODARONE LOAD VIA INFUSION
150.0000 mg | Freq: Once | INTRAVENOUS | Status: AC
  Administered 2019-09-13: 150 mg via INTRAVENOUS
  Filled 2019-09-13: qty 83.34

## 2019-09-13 MED ORDER — CHLORHEXIDINE GLUCONATE 0.12% ORAL RINSE (MEDLINE KIT)
15.0000 mL | Freq: Two times a day (BID) | OROMUCOSAL | Status: DC
  Administered 2019-09-13 (×2): 15 mL via OROMUCOSAL

## 2019-09-13 NOTE — Progress Notes (Signed)
NAME:  Caitlin Cohen, MRN:  [id number], DOB:  1965/06/09, LOS: 2 ADMISSION DATE:  09/12/2019, CONSULTATION DATE:  09/01/2019 REFERRING MD:  Ralene Bathe, CHIEF COMPLAINT:  Cardiac Arrest with ROSC/ Metastatic Breast Cancer with brain mets  Brief History   54 year old female with history of metastatic breast cancer , with mets to bone, brain and liver, Pt.  Presents to the ED 08/28/2019 post cardiac arrest with ROSC in the field. Last known normal 5/28 at 12 noon. She was found pulseless at home by family 5/29 am who stated she was surrounded by cocaine and emesis when they found her. She was treated be EMS  with 4 Epi's, defib x 1, SVT episode - cardioverted x 2, given Amio 467m, for return of pulses.and spontaneous RR.  Then five  min prior to ED arrival, patient lost  Pulses, and was  given another Epi, and Narcan.   She was intubated in the ED, and treatment was initiated for hypotension, acidosis, lactate > 11, maroon OG drainage  and a fib with RVR. PCCM were called to admit and manage care.   Past Medical History    has a past medical history of Anemia, Anxiety, [place]), Depression, Diabetes mellitus without complication ([place], GERD (gastroesophageal reflux disease), Headache, History of radiation therapy (11/10/17- 12/08/17), Met[place]atic cancer [place] (dx[place]), Personal history of chemotherapy, and Personal history of radiation therapy.  Significant Hospital Events   09/10/2019: Cardiac Arrest>> Admission to [facility]  Consults:  5/29 PCCM  Procedures:  5/29 ETT>>  Significant Diagnostic Tests:  5/30 UDS> cocaine, opiate positive 5/30 TTE> mild LVH, LVEF 660-73% RV systolic function normal, RV size normal, elevated PA pressure,   Micro Data:  5/29 SARS negative 5/29 Blood Cx>> MSSA  5/29 Urine Cx>>   Antimicrobials:  Zosyn x 1 5/29 Unasyn 5/29>>    Interim history/subjective:   No change in mental status. Remiains on pressors Awaiting arrival of  family  Objective   Blood pressure 109/74, pulse 97, temperature 100 F (37.8 C), resp. rate 19, weight 123.3 kg, SpO2 98 %.    Vent Mode: PRVC FiO2 (%):  [40 %] 40 % Set Rate:  [16 bmp-18 bmp] 16 bmp Vt Set:  [480 mL] 480 mL PEEP:  [5 cmH20] 5 cmH20 Plateau Pressure:  [11 cmH20-17 cmH20] 16 cmH20   Intake/Output Summary (Last 24 hours) at 09/13/2019 0949 Last data filed at 09/13/2019 0550 Gross per 24 hour  Intake 4496.36 ml  Output 580 ml  Net 3916.36 ml   Filed Weights   08/25/2019 1138  Weight: 123.3 kg    Examination: HEENT:  EOMI, sclera anicteric Neck:     No masses; no thyromegaly, ETT Lungs:    Clear to auscultation bilaterally; normal respiratory effort CV:         Regular rate and rhythm; no murmurs Abd:      + bowel sounds; soft, non-tender; no palpable masses, no distension Ext:    No edema; adequate peripheral perfusion Skin:      Warm and dry; no rash Neuro: Unresponsive  No new labs or imaging  Resolved Hospital Problem list     Assessment & Plan:  Post Cardiac Arrest/ Cardiogenic Shock due to septic shock from MSSA bacteremia all present on admission Continue levophed, do not increase to second vasopressor Continue Unasyn Tele No escalation of care  Acute metabolic encephalopathy, worrisome for anoxic brain injury Vent dyssynchrony, need for sedation Chronic pain in setting of breast  cancer, metastatic disease with multiple bone mets Hold sedatives as able As needed fentanyl PAD protocol, RASS target 0   Demand ischemia Atrial fib with RVR Tele Continue supportive care Amiodarone Off Cardizem  Acute Respiratory Failure in setting of Cardiac Arrest Aspiration pneumonitis Full mechanical vent support VAP prevention Daily WUA/SBT   Neutropenia in setting of biologic Therapy for Breast Cancer Monitor CBC  Acute Renal Failure: Creatinine of 6.19 Last known Creatinine 5/12>> 1.05 Plan Continue supportive care Monitor BMET and  UOP Replace electrolytes as needed  DM Hyperglycemia Plan CBG Q4 SSI  AMS/ Suspected Anoxic Damage Unknown Down Time Posturing noted per Nursing in ED upon arrival Supportive care  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 [place] Bells 5/31-daughter agreeable to sitting down as family and moving to full comfort measures after family has arrived from [place] over next 24 hours.  No escalation if worsens.  DNR if cardiac arrest.  Best practice:  Diet: NPO Pain/Anxiety/Delirium protocol (if indicated): Fentanyl gtt, PRN versed VAP protocol (if indicated): Initiated DVT prophylaxis: PAS GI prophylaxis: pepcid Glucose control: CBG Q 4, SSI Mobility:  BR Code Status:  DNR Family Communication: Daughter updated bedside on 5/30.  Awaiting arrival of family 5/31 Disposition: ICU  Critical care time:    The patient is critically ill with multiple organ system failure and requires high complexity decision making for assessment and support, frequent evaluation and titration of therapies, advanced monitoring, review of radiographic studies and interpretation of complex data.   Critical Care Time devoted to patient care services, exclusive of separately billable procedures, described in this note is 35 minutes.   Marshell Garfinkel MD [facility] Please see [email or web address] for pager details.  09/13/2019, 9:49 AM

## 2019-09-13 NOTE — Progress Notes (Signed)
PCCM progress note  Met with sister and daughter at bedside. Discussed clinical course and poor prognosis for recovery Concern for anoxic injury  Family has agreed to make DNR.  Continue current support with no additional pressors or escalation Patient's brothers are arriving from [place] tomorrow when final decision on goals of care is to be made.  The patient is critically ill with multiple organ system failure and requires high complexity decision making for assessment and support, frequent evaluation and titration of therapies, advanced monitoring, review of radiographic studies and interpretation of complex data.   Critical Care Time devoted to patient care services, exclusive of separately billable procedures, described in this note is 35 minutes.   Marshell Garfinkel MD [facility] Please see [email or web address] for pager details.  09/13/2019, 2:51 PM

## 2019-09-13 NOTE — Progress Notes (Signed)
PCCM progress note  Has worsening tachypnea, desats with rhonchorous breath sounds Chest x-ray with increasing bilateral airspace disease Likely has ARDS secondary to aspiration versus pulmonary edema  Start Lasix for diuresis Continue Unasyn Low TV ventilation  Prognosis is poor and family is aware  No escalation in care.  The patient is critically ill with multiple organ system failure and requires high complexity decision making for assessment and support, frequent evaluation and titration of therapies, advanced monitoring, review of radiographic studies and interpretation of complex data.   Critical Care Time devoted to patient care services, exclusive of separately billable procedures, described in this note is 15 minutes.    Marshell Garfinkel MD [facility] Please see [email or web address] for pager details.  09/13/2019, 4:59 PM

## 2019-09-13 NOTE — Plan of Care (Signed)

## 2019-09-13 NOTE — Progress Notes (Signed)
eLink Physician-Brief Progress Note Patient Name: Caitlin Cohen DOB: April 01, 1966 MRN: [id number]   Date of Service  09/13/2019  HPI/Events of Note  AFIB with RVR - Ventricular rate = 144. BP = 11*/80. Patient is already on an Amiodarone IV infusion.   eICU Interventions  Plan: 1. Bolus with Amiodarone 150 mg IV over 10 minutes now.  2. BMP and Magnesium level STAT.      Intervention Category Major Interventions: Arrhythmia - evaluation and management  Vonzell Lindblad Cornelia Copa 09/13/2019, 8:35 PM

## 2019-09-13 NOTE — Progress Notes (Signed)
eLink Physician-Brief Progress Note Patient Name: Caitlin Cohen DOB: 24-Jun-1965 MRN: [id number]   Date of Service  09/13/2019  HPI/Events of Note  Multiple issues: 1. Hypomagnesemia - Mg++ = 1.3 and Creatinine = 5.96 and 2. Hypocalcemia - Ca++ = 4.7 which corrects to 6.38 (Low) given albumin = 1.9.   eICU Interventions  Plan: 1. Will replace Mg++ and Ca++.      Intervention Category Major Interventions: Electrolyte abnormality - evaluation and management  Sommer,Steven Eugene 09/13/2019, 9:50 PM

## 2019-09-14 ENCOUNTER — Other Ambulatory Visit: Payer: Medicaid Other

## 2019-09-14 LAB — CULTURE, BLOOD (ROUTINE X 2): Special Requests: ADEQUATE

## 2019-09-14 LAB — GLUCOSE, CAPILLARY
Glucose-Capillary: 81 mg/dL (ref 70–99)
Glucose-Capillary: 55 mg/dL — ABNORMAL LOW (ref 70–99)
Glucose-Capillary: 65 mg/dL — ABNORMAL LOW (ref 70–99)
Glucose-Capillary: 43 mg/dL — CL (ref 70–99)
Glucose-Capillary: 64 mg/dL — ABNORMAL LOW (ref 70–99)

## 2019-09-14 MED ORDER — DEXTROSE 50 % IV SOLN
INTRAVENOUS | Status: AC
  Administered 2019-09-14: 50 mL
  Filled 2019-09-14: qty 50

## 2019-09-14 MED ORDER — DEXTROSE 50 % IV SOLN
1.0000 | Freq: Once | INTRAVENOUS | Status: AC
  Administered 2019-09-14: 50 mL via INTRAVENOUS

## 2019-09-14 MED ORDER — GLYCOPYRROLATE 0.2 MG/ML IJ SOLN: 0.2000 mg | INTRAMUSCULAR | Status: DC | PRN

## 2019-09-14 MED ORDER — DEXTROSE 5 % IV SOLN: INTRAVENOUS | Status: DC

## 2019-09-14 MED ORDER — GLYCOPYRROLATE 1 MG PO TABS: 1.0000 mg | ORAL_TABLET | ORAL | Status: DC | PRN

## 2019-09-14 MED ORDER — DEXTROSE 50 % IV SOLN
INTRAVENOUS | Status: AC
  Filled 2019-09-14: qty 50

## 2019-09-14 MED ORDER — POLYVINYL ALCOHOL 1.4 % OP SOLN
1.0000 [drp] | Freq: Four times a day (QID) | OPHTHALMIC | Status: DC | PRN
  Filled 2019-09-14: qty 15

## 2019-09-14 MED ORDER — DIPHENHYDRAMINE HCL 50 MG/ML IJ SOLN: 25.0000 mg | INTRAMUSCULAR | Status: DC | PRN

## 2019-09-14 NOTE — Progress Notes (Signed)
Waiting for out of town family to arrive. Daughter at bedside states that she wanted to wait for her brother to arrive before removing breathing machine.

## 2019-09-14 NOTE — Progress Notes (Signed)
PCCM progress note  Discussed with family in conference room Reviewed clinical course and poor prognosis for recovery and concern for anoxic injury as she does not have any corneal or gag reflex. Family is in agreement with terminal withdrawal with comfort measures.  They have requested to continue vent, pressors until later today when they are expecting additional family members to arrive from [place] Once the family is ready we will transition to comfort measures. They are aware that she may deteriorate and passed away even before the rest of family has a chance to visit.  Additional time for conference- 10 mins  Chyanne Kohut MD [facility] Please see [email or web address] for pager details.  09/14/2019, 5:17 PM

## 2019-09-14 NOTE — Progress Notes (Signed)
Inpatient Diabetes Program Recommendations  AACE/ADA: New Consensus Statement on Inpatient Glycemic Control (2015)  Target Ranges:  Prepandial:   less than 140 mg/dL      Peak postprandial:   less than 180 mg/dL (1-2 hours)      Critically ill patients:  140 - 180 mg/dL   Lab Results  Component Value Date   GLUCAP 65 (L) 09/14/2019   HGBA1C 6.0 (H) 08/14/2019    Review of Glycemic Control Results for ANTWAN, RUCCI ANN "Maudie Mercury (MRN [id number]) as of 09/14/2019 12:26  Ref. Range 09/13/2019 08:22 09/13/2019 11:36 09/13/2019 15:49  Glucose-Capillary Latest Ref Range: 70 - 99 mg/dL 131 (H) 107 (H) 111 (H)   Results for AQUARIUS, STOKLEY ANN "Maudie Mercury (MRN [id number]) as of 09/14/2019 12:26  Ref. Range 09/13/2019 19:40 09/13/2019 23:42 09/14/2019 03:43 09/14/2019 07:45 09/14/2019 11:43  Glucose-Capillary Latest Ref Range: 70 - 99 mg/dL 126 (H) 92 81 55 (L) 65 (L)   Diabetes history:  DM2  Outpatient Diabetes medications:  Metformin 500 bid  Current orders for Inpatient glycemic control:  Novolog 0-15 q4h  Inpatient Diabetes Program Recommendations:    Novolog very sensitive correction scale 0-6 units q4h  Thank you, Reche Dixon, RN, BSN Diabetes Coordinator Inpatient Diabetes Program [phone] (team pager from 8a-5p)

## 2019-09-14 NOTE — Progress Notes (Signed)
Family at bedside. 

## 2019-09-14 NOTE — Progress Notes (Signed)
Patient's BP continuing to trend down despite max dose of levophed and decreasing dose of fentanyl. Call placed to family to inform them and let them know they should come be with the patient in case she continues to decline. Patient is already DNR and has notes that we are not escalating care. Daughter to come up to hospital.   Milford Cage, RN

## 2019-09-14 NOTE — Progress Notes (Signed)
NAME:  Caitlin Cohen, MRN:  [id number], DOB:  10/30/1965, LOS: 3 ADMISSION DATE:  09/04/2019, CONSULTATION DATE:  08/30/2019 REFERRING MD:  Ralene Bathe, CHIEF COMPLAINT:  Cardiac Arrest with ROSC/ Metastatic Breast Cancer with brain mets  Brief History   54 year old female with history of metastatic breast cancer , with mets to bone, brain and liver, Pt.  Presents to the ED 09/06/2019 post cardiac arrest with ROSC in the field. Last known normal 5/28 at 12 noon. She was found pulseless at home by family 5/29 am who stated she was surrounded by cocaine and emesis when they found her. She was treated be EMS  with 4 Epi's, defib x 1, SVT episode - cardioverted x 2, given Amio 428m, for return of pulses.and spontaneous RR.  Then five  min prior to ED arrival, patient lost  Pulses, and was  given another Epi, and Narcan.   She was intubated in the ED, and treatment was initiated for hypotension, acidosis, lactate > 11, maroon OG drainage  and a fib with RVR. PCCM were called to admit and manage care.   Past Medical History    has a past medical history of Anemia, Anxiety, Cancer ([place] ([place]), Depression, Diabetes [place] complication [place], GERD (gastroesophageal reflux disease), Headache, History of radiation therapy (11/10/17- 12/08/17), Metastatic cancer to bone ([place] ([place] 09[place]2018), Personal history of chemotherapy, and Personal history of radiation therapy.  Significant Hospital Events   09/10/2019: Cardiac Arrest>> Admission to [facility]  Consults:  5/29 PCCM  Procedures:  5/29 ETT>>  Significant Diagnostic Tests:  5/30 UDS> cocaine, opiate positive 5/30 TTE> mild LVH, LVEF 616-38% RV systolic function normal, RV size normal, elevated PA pressure,   Micro Data:  5/29 SARS negative 5/29 Blood Cx>> MSSA  5/29 Urine Cx>>   Antimicrobials:  Zosyn x 1 5/29 Unasyn 5/29>>    Interim history/subjective:   No cough or gag.  Remains on pressors and vent  Objective   Blood pressure (!)  64/45, pulse (!) 103, temperature 98.3 F (36.8 C), temperature source Axillary, resp. rate (!) 24, weight (!) 137.5 kg, SpO2 95 %.    Vent Mode: PRVC FiO2 (%):  [40 %-100 %] 100 % Set Rate:  [16 bmp-18 bmp] 16 bmp Vt Set:  [480 mL] 480 mL PEEP:  [5 cmH20-8 cmH20] 8 cmH20   Intake/Output Summary (Last 24 hours) at 09/14/2019 0919 Last data filed at 09/14/2019 0700 Gross per 24 hour  Intake 5566.59 ml  Output 1008 ml  Net 4558.59 ml   Filed Weights   09/12/2019 1138 09/14/19 0453  Weight: 123.3 kg (!) 137.5 kg    Examination: Gen:      No acute distress HEENT:  EOMI, sclera anicteric Neck:     No masses; no thyromegaly, ETT Lungs:    Clear to auscultation bilaterally; normal respiratory effort CV:         Regular rate and rhythm; no murmurs Abd:      + bowel sounds; soft, non-tender; no palpable masses, no distension Ext:    No edema; adequate peripheral perfusion Skin:      Warm and dry; no rash Neuro: Comatose  Resolved Hospital Problem list     Assessment & Plan:  Post Cardiac Arrest/ Cardiogenic Shock due to septic shock from MSSA bacteremia all present on admission Continue levophed, do not increase to second vasopressor Continue Unasyn Tele No escalation of care  Acute metabolic encephalopathy, worrisome for anoxic brain injury Vent dyssynchrony, need for  sedation Chronic pain in setting of breast cancer, metastatic disease with multiple bone mets Hold sedatives as able As needed fentanyl PAD protocol, RASS target 0   Demand ischemia Atrial fib with RVR Tele Continue supportive care Amiodarone Off Cardizem  Acute Respiratory Failure in setting of Cardiac Arrest Aspiration pneumonitis Full mechanical vent support VAP prevention Daily WUA/SBT   Neutropenia in setting of biologic Therapy for Breast Cancer Monitor CBC  Acute Renal Failure: Creatinine of 6.19 Last known Creatinine 5/12>> 1.05 Plan Continue supportive care Monitor BMET and UOP Replace  electrolytes as needed  DM Hyperglycemia Plan CBG Q4 SSI  AMS/ Suspected Anoxic Damage Unknown Down Time Posturing noted per Nursing in ED upon arrival Supportive care  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  Prognosis is poor Awaiting arrival of family.  Best practice:  Diet: NPO Pain/Anxiety/Delirium protocol (if indicated): Fentanyl gtt, PRN versed VAP protocol (if indicated): Initiated DVT prophylaxis: PAS GI prophylaxis: pepcid Glucose control: CBG Q 4, SSI Mobility:  BR Code Status:  DNR Family Communication: Daughter updated bedside on 5/30.  Awaiting arrival of family 5/31 Disposition: ICU  Critical care time:    The patient is critically ill with multiple organ system failure and requires high complexity decision making for assessment and support, frequent evaluation and titration of therapies, advanced monitoring, review of radiographic studies and interpretation of complex data.   Critical Care Time devoted to patient care services, exclusive of separately billable procedures, described in this note is 35 minutes.   Marshell Garfinkel MD [facility] Please see [email or web address] for pager details.  09/14/2019, 9:19 AM

## 2019-09-14 NOTE — Progress Notes (Signed)
Family arrived from out of town. Family had discussion with Dr. Oletta Darter regarding patient's current status and prognosis. Family had their questions answered. Family met in conference room and came to decision that they would withdraw her care and "let her go." Family reassured that we would make her very comfortable and she would not suffer. Family does not want to be present for withdrawal but ask that sister Tomi Bamberger be called with TOD. Emotional support to family and family given opportunity to say goodbye.   Milford Cage, RN

## 2019-09-14 NOTE — Progress Notes (Addendum)
PCCM INTERVAL PROGRESS NOTE  Family members are present and have indicated to RN they are prepared to proceed with comfort measures.   BP (!) 56/42   Pulse 92   Temp (!) 97.4 F (36.3 C) (Axillary)   Resp (!) 22   Wt (!) 137.5 kg   SpO2 95%   BMI 47.48 kg/m     Georgann Housekeeper, AGACNP-BC [place] for personal pager PCCM on call pager [phone]  09/14/2019 8:42 PM

## 2019-09-14 DEATH — deceased

## 2019-09-15 ENCOUNTER — Ambulatory Visit: Payer: Medicaid Other | Admitting: Occupational Therapy

## 2019-09-15 LAB — GLUCOSE, CAPILLARY: Glucose-Capillary: 90 mg/dL (ref 70–99)

## 2019-09-15 LAB — PATHOLOGIST SMEAR REVIEW

## 2019-09-15 MED FILL — IBRANCE 125 MG TABS: 125 | 28 days supply | Qty: 21 | Fill #0

## 2019-09-16 ENCOUNTER — Other Ambulatory Visit: Payer: Medicaid Other

## 2019-09-16 ENCOUNTER — Ambulatory Visit: Payer: Medicaid Other | Admitting: Hematology

## 2019-09-17 LAB — CULTURE, BLOOD (ROUTINE X 2)
Culture: NO GROWTH
Culture: NO GROWTH

## 2019-09-23 ENCOUNTER — Ambulatory Visit: Payer: Medicaid Other | Admitting: Physical Therapy

## 2019-10-04 ENCOUNTER — Encounter: Payer: Medicaid Other | Admitting: Physical Therapy

## 2019-10-08 ENCOUNTER — Encounter: Payer: Medicaid Other | Admitting: Physical Therapy

## 2019-10-11 ENCOUNTER — Encounter: Payer: Medicaid Other | Admitting: Physical Therapy

## 2019-10-11 ENCOUNTER — Other Ambulatory Visit: Payer: Medicaid Other

## 2019-10-13 ENCOUNTER — Encounter: Payer: Medicaid Other | Admitting: Physical Therapy

## 2019-10-13 ENCOUNTER — Ambulatory Visit: Payer: Medicaid Other | Admitting: Radiation Oncology

## 2019-10-14 NOTE — Discharge Summary (Signed)
Physician Death Summary  Patient ID: Caitlin Cohen MRN: [id number] DOB/AGE: 11/11/1965 54 y.o.  Admit date: 08/15/2019 Discharge date: 09/23/19  Admission Diagnoses: Cardiac arrest  Discharge Diagnoses:  Active Problems:   Cardiac arrest ([place])   Acute respiratory failure with hypoxemia (HCC) Septic shock, present on admission secondary to MSSA bacteremia Acute metabolic encephalopathy, anoxic brain injury Demand ischemia Atrial fibrillation with RVR Aspiration pneumonia ARDS Acute renal failure Diabetes with hyperglycemia  Discharged Condition: Deceased  Hospital Course:  54 year old female with history of metastatic breast cancer , with mets to bone, brain and liver.  Presents to the ED 08/28/2019 post cardiac arrest with ROSC in the field. She was found pulseless at home by family 5/29 am who stated she was surrounded by cocaine and emesis when they found her. She was treated be EMS  with 4 Epi's, defib x 1, SVT episode - cardioverted x 2, given Amio 450mg , for return of pulses.and spontaneous RR.  Then five  min prior to ED arrival, patient lost  Pulses, and was  given another Epi, and Narcan.  She was intubated in the ED, and treatment was initiated for hypotension, acidosis, lactate > 11, maroon OG drainage  and a fib with RVR. PCCM were called to admit and manage care.   She had a poor clinical course with ARDS, MSSA pneumonia, atrial fibrillation with RVR, demand ischemia, aspiration pneumonia.  Neurologic status is poor with no gag or cough reflex. We had multiple discussions with family reviewed clinical course and poor prognosis for recovery and concern for anoxic injury as she does not have any corneal or gag reflex. Family is in agreement with terminal withdrawal and comfort measures. She was compassionately extubated and passed away after family had a chance to visit.     Marshell Garfinkel MD [facility] Please see [email or web address] for pager details.   2019-09-23, 10:39 AM

## 2019-10-14 NOTE — Procedures (Signed)
Extubation Procedure Note  Patient Details:   Name: Caitlin Cohen DOB: [birth year] MRN: [id number]   Airway Documentation:    Vent end date: 20-Sep-2019 Vent end time: 0139   Evaluation  O2 sats: currently acceptable Complications: No apparent complications Patient did tolerate procedure well. Bilateral Breath Sounds: Coarse crackles, Diminished   Patient termianaly extubated per MD order.   Catha Brow 09-20-19, 3:11 AM

## 2019-10-14 NOTE — Progress Notes (Signed)
Patient's daughter and cousins asked to be at bedside for extubation. Patient extubated at [place] and pressors turned off at 0143. At [place] patient expired. Verified by two RN's- Pieter Partridge, RN and Brien Mates, RN. MD made aware. Family present during patient passing. CDS called to notify of TOD. Supportive care given to family . Patient Placement card given to pt Sister- Anne Kilmon.   Milford Cage, RN

## 2019-10-14 DEATH — deceased

## 2019-10-15 ENCOUNTER — Ambulatory Visit: Payer: Medicaid Other | Admitting: Radiation Oncology

## 2019-10-15 ENCOUNTER — Ambulatory Visit: Payer: Medicaid Other | Admitting: Internal Medicine

## 2019-10-19 ENCOUNTER — Encounter: Payer: Medicaid Other | Admitting: Physical Therapy

## 2019-10-22 ENCOUNTER — Encounter: Payer: Medicaid Other | Admitting: Physical Therapy

## 2019-10-25 ENCOUNTER — Encounter: Payer: Medicaid Other | Admitting: Physical Therapy

## 2019-10-29 ENCOUNTER — Encounter: Payer: Medicaid Other | Admitting: Physical Therapy

## 2019-11-02 ENCOUNTER — Encounter: Payer: Medicaid Other | Admitting: Physical Therapy

## 2019-11-04 ENCOUNTER — Encounter: Payer: Medicaid Other | Admitting: Physical Therapy

## 2019-11-08 ENCOUNTER — Encounter: Payer: Medicaid Other | Admitting: Physical Therapy

## 2019-11-11 ENCOUNTER — Encounter: Payer: Medicaid Other | Admitting: Physical Therapy

## 2021-03-29 IMAGING — CR CHEST - 2 VIEW
2 series · 2 of 2 positions shown · non-contrast
Comparison: 09/26/2017

CLINICAL DATA: Pre-op respiratory exam for lumbar spine surgery.
Metastatic breast carcinoma.

EXAM:
CHEST - 2 VIEW

[w chest pa]
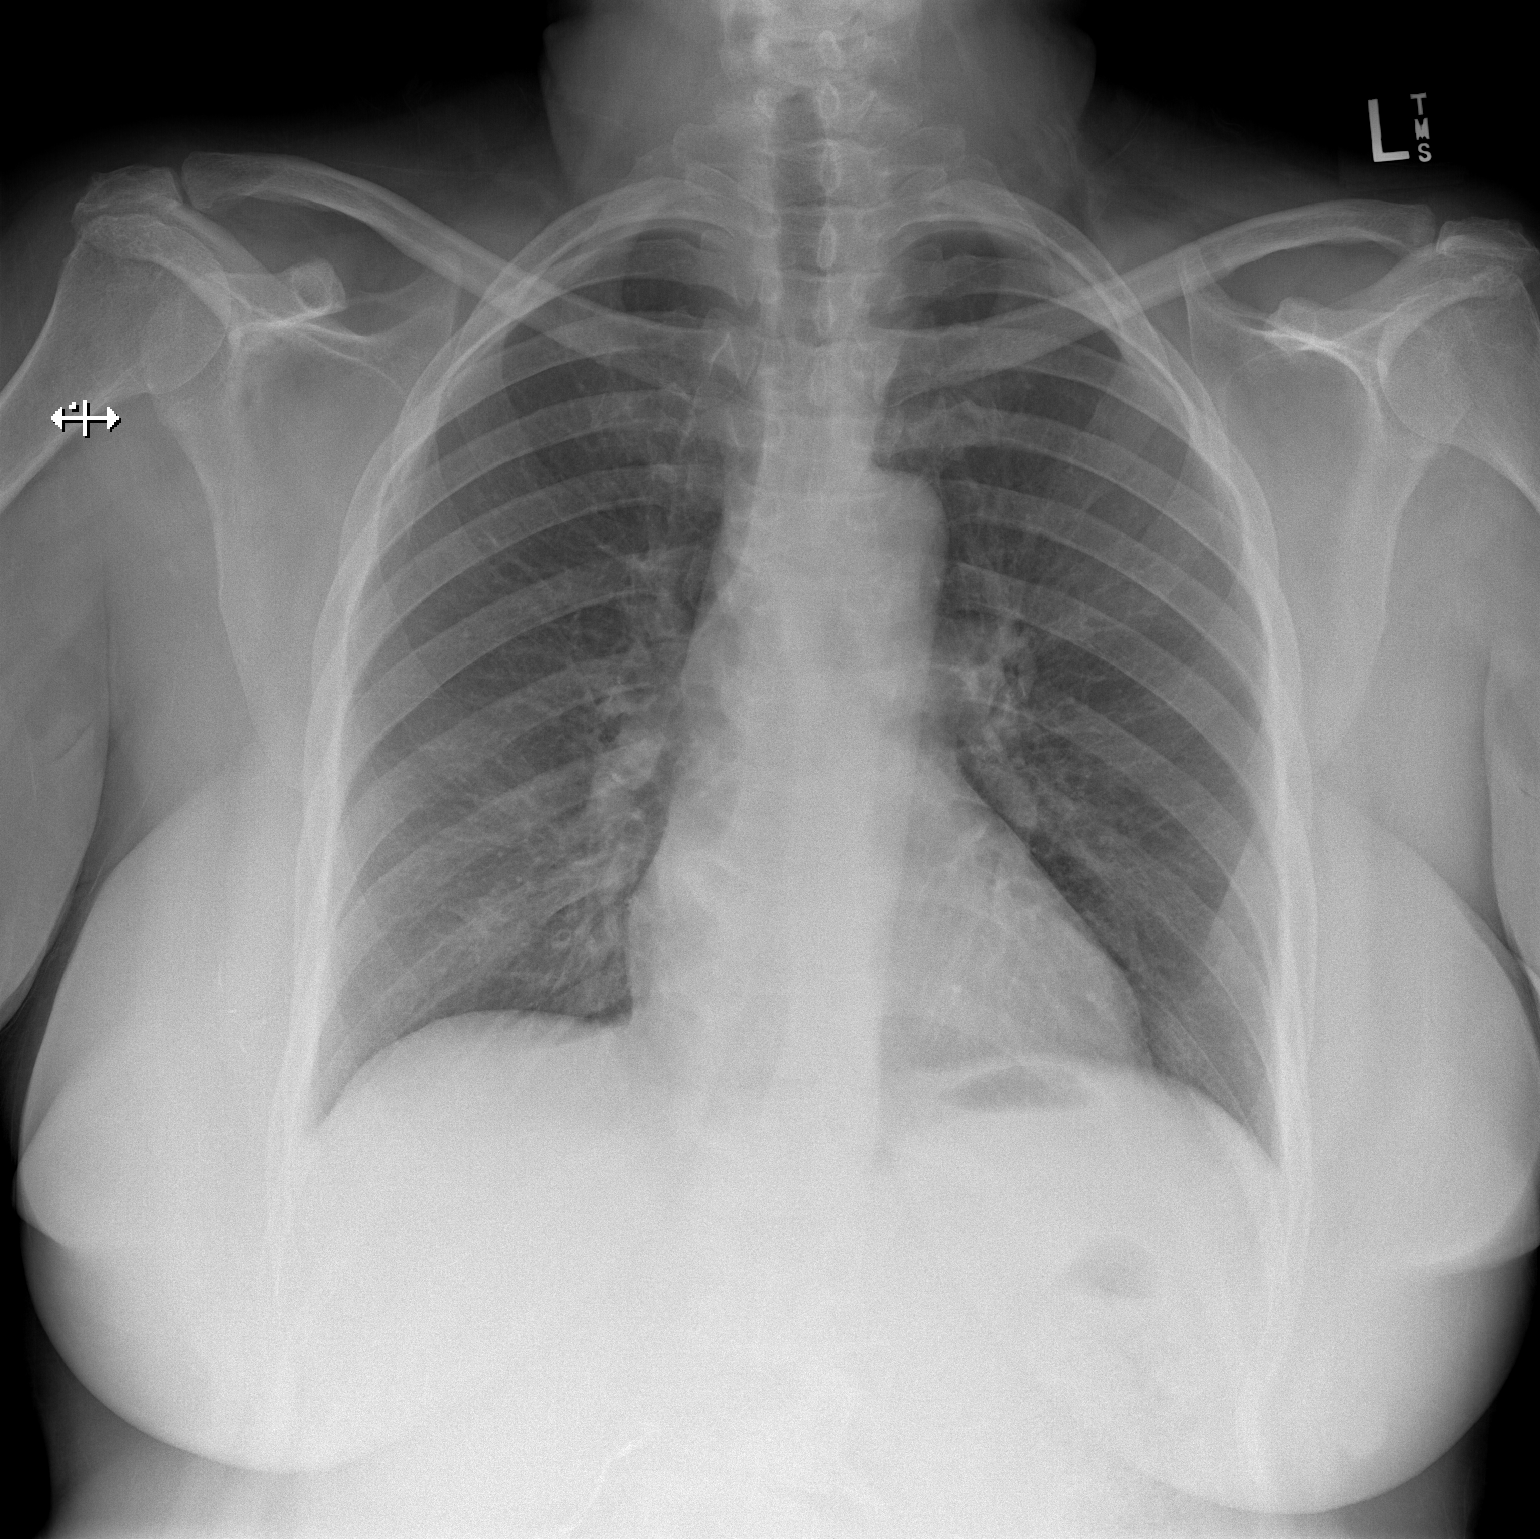

[w chest lat]
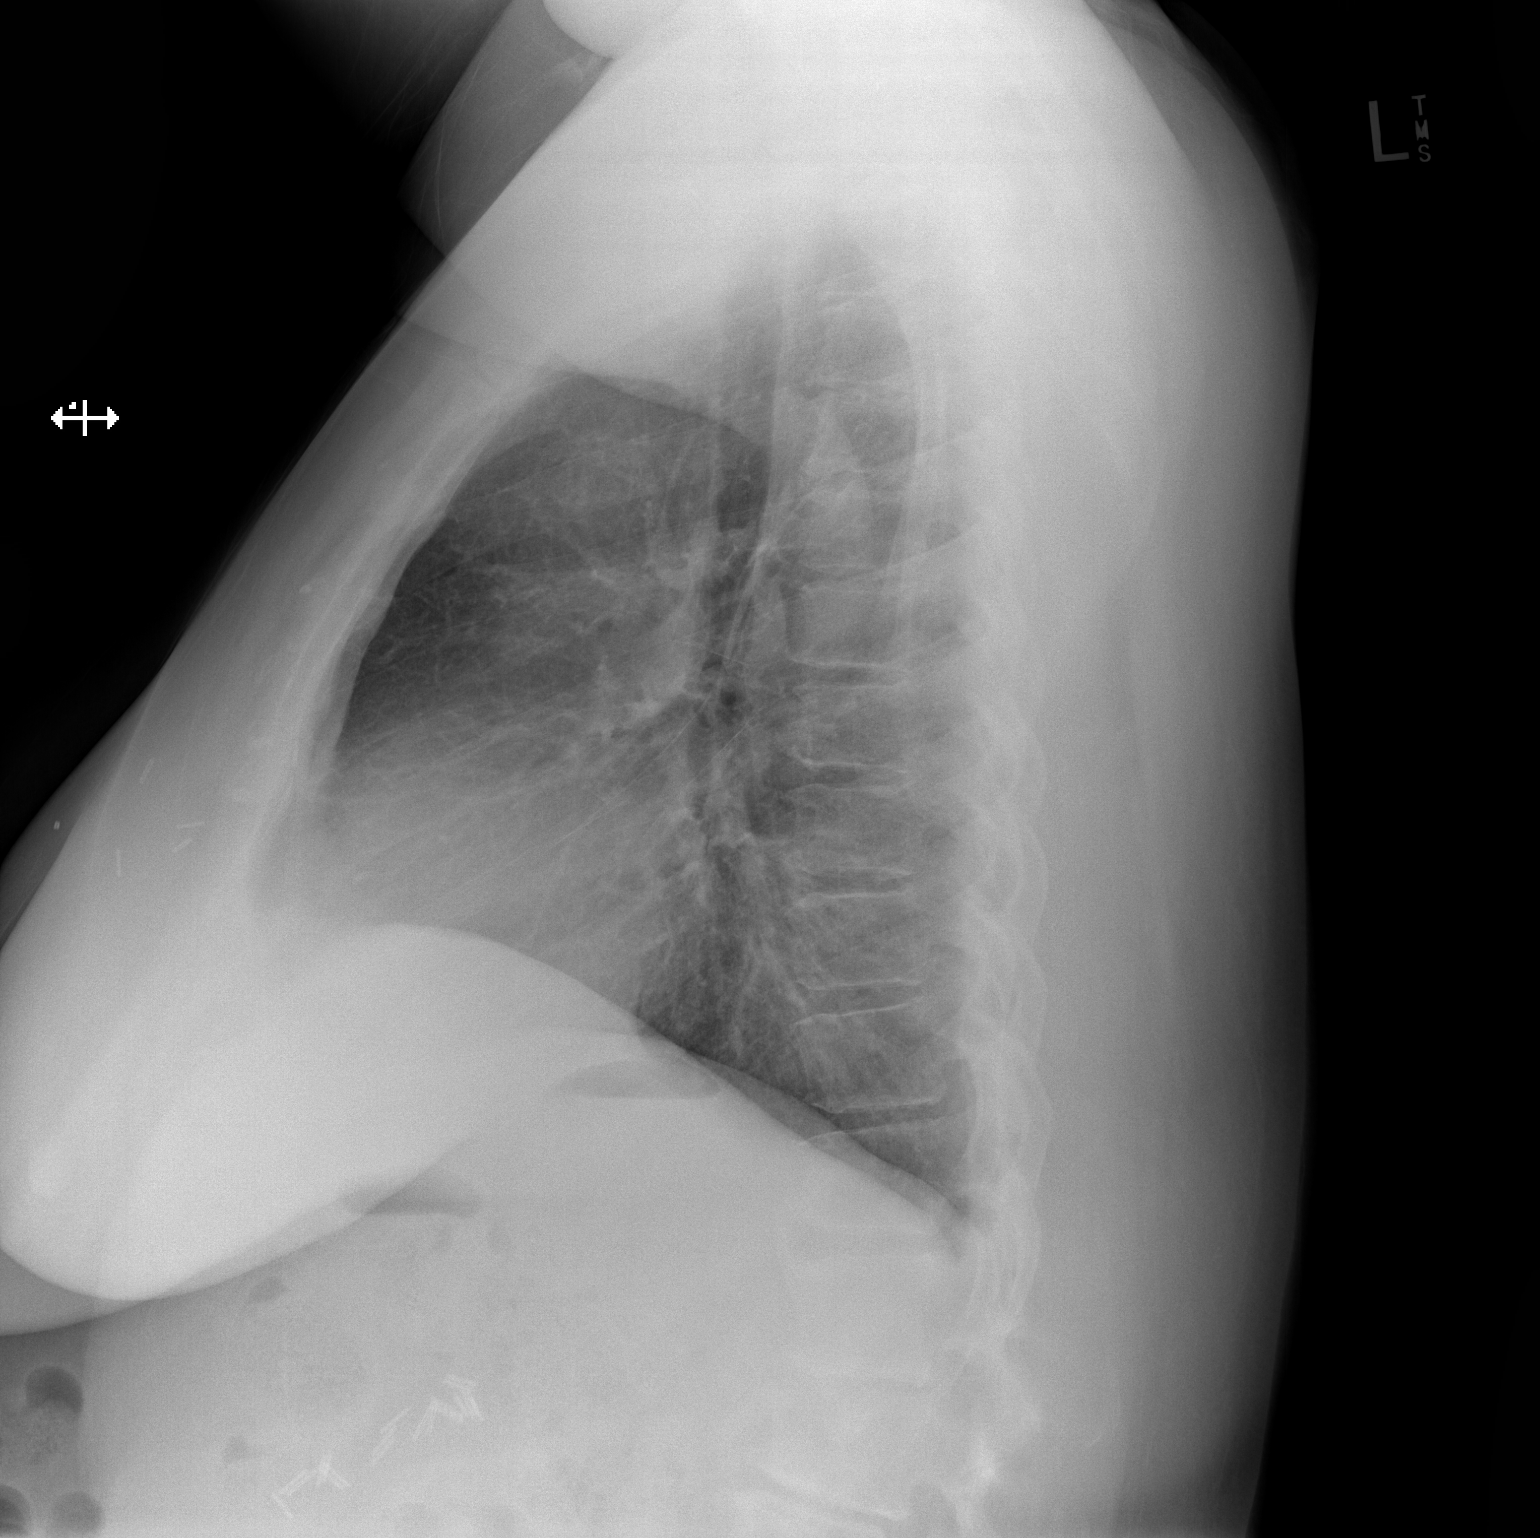

[2 of 2 positions shown; findings below may reference images not displayed]

FINDINGS: The heart size and mediastinal contours are within normal limits.
Both lungs are clear. The visualized skeletal structures are
unremarkable.
IMPRESSION: No active cardiopulmonary disease.

## 2021-10-04 IMAGING — MR MR HEAD WO/W CM
14 of 16 series · 40 of 48 positions shown · IV contrast (gadavist)
Comparison: Brain MRI 04/29/2019

CLINICAL DATA: Intracranial mass status post biopsy.

EXAM:
MRI HEAD WITHOUT AND WITH CONTRAST
TECHNIQUE: Multiplanar, multiecho pulse sequences of the brain and surrounding
structures were obtained without and with intravenous contrast.
CONTRAST:  10mL GADAVIST GADOBUTROL 1 MMOL/ML IV SOLN

[Series 5: DWI · axial · 3.0mm · 0.88mm/px · z∈[-170,-35]mm · 6 of 97 slices shown (1 of 4)]
[im 1/97]
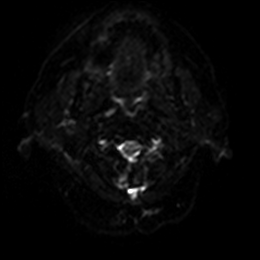
[im 20/97]
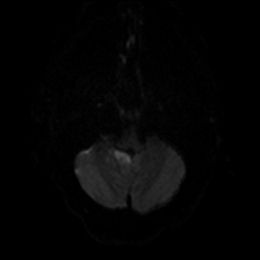
[im 39/97]
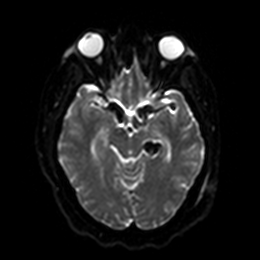
[im 58/97]
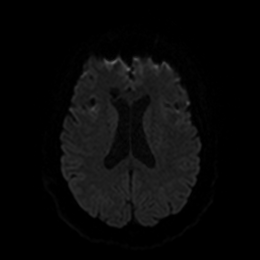
[im 77/97]
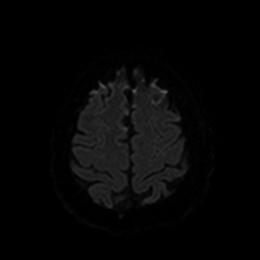
[im 97/97]
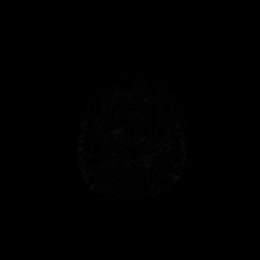

[Series 6: DWI · axial · 3.0mm · 0.88mm/px · z∈[-170,-35]mm · 3 of 49 slices shown (2 of 4)]
[im 1/49]
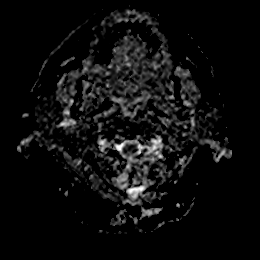
[im 25/49]
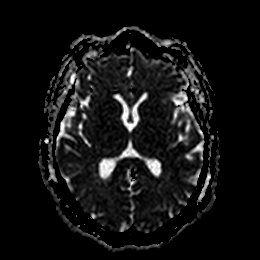
[im 49/49]
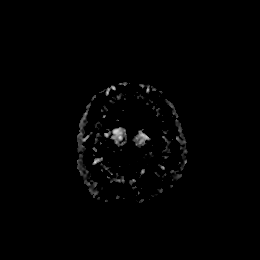

[Series 7: DWI · coronal · 4.0mm · 0.88mm/px · 4 of 68 slices shown (3 of 4)]
[im 1/68]
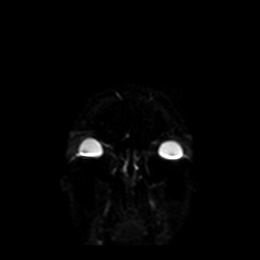
[im 23/68]
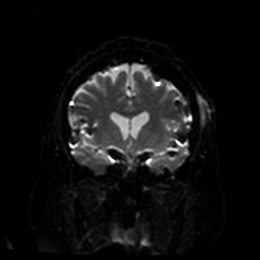
[im 45/68]
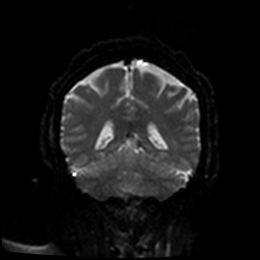
[im 68/68]
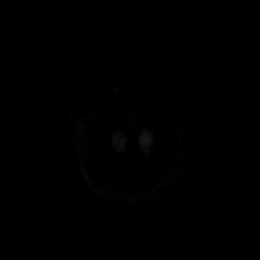

[Series 8: DWI · coronal · 4.0mm · 0.88mm/px · 2 of 34 slices shown (4 of 4)]
[im 1/34]
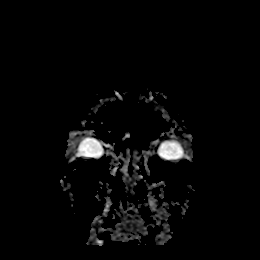
[im 34/34]
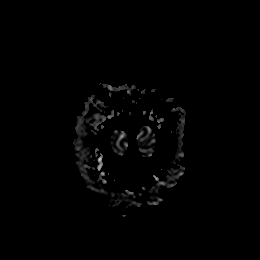

[Series 9: T1 · sagittal · 5.0mm · 0.75mm/px · 1 of 23 slices shown]
[im 1/23]
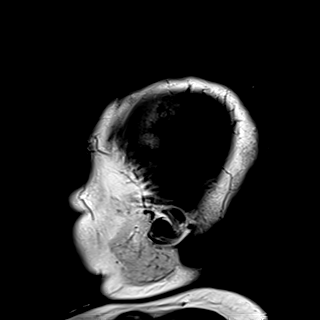

[Series 10: T2 · axial · 5.0mm · 0.72mm/px · z∈[-170,-35]mm · 2 of 25 slices shown]
[im 1/25]
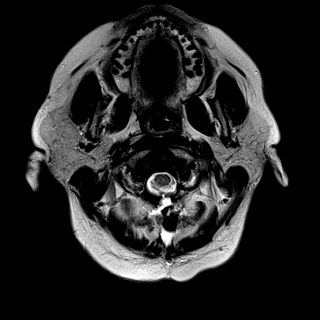
[im 25/25]
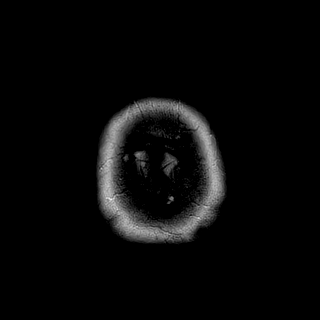

[Series 11: FLAIR · axial · 5.0mm · 0.45mm/px · z∈[-168,-33]mm · 2 of 25 slices shown]
[im 1/25]
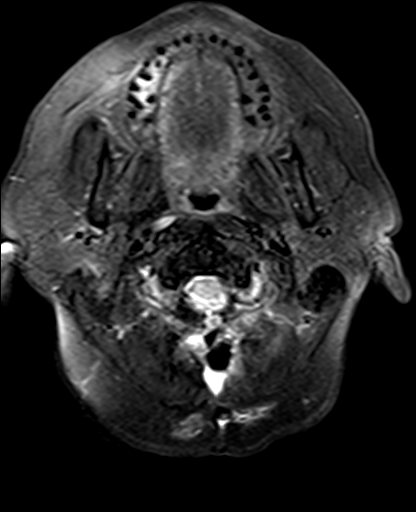
[im 25/25]
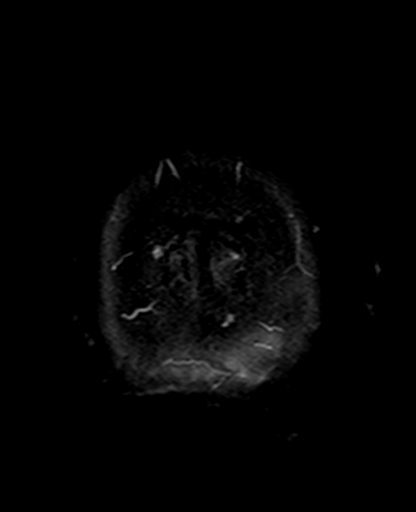

[Series 12: mag_images · axial · 3.0mm · 0.90mm/px · z∈[-177,-11]mm · 4 of 60 slices shown]
[im 1/60]
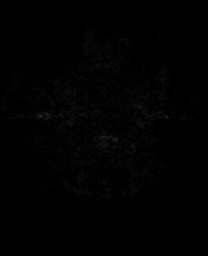
[im 20/60]
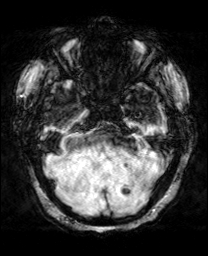
[im 40/60]
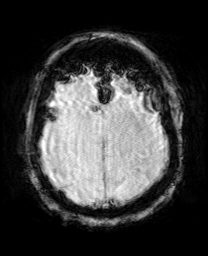
[im 60/60]
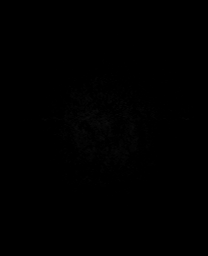

[Series 13: pha_images · axial · 3.0mm · 0.90mm/px · z∈[-175,-11]mm · 4 of 55 slices shown]
[im 1/55]
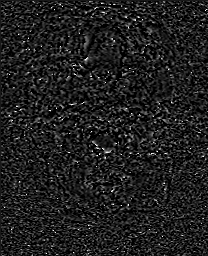
[im 19/55]
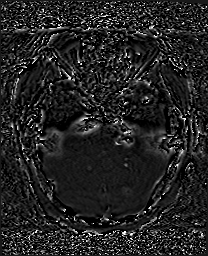
[im 37/55]
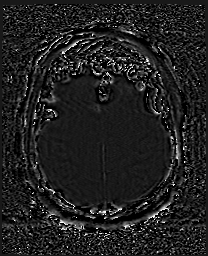
[im 55/55]
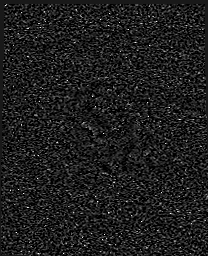

[Series 14: swi_images · axial · 3.0mm · 0.90mm/px · z∈[-177,-11]mm · 4 of 60 slices shown]
[im 1/60]
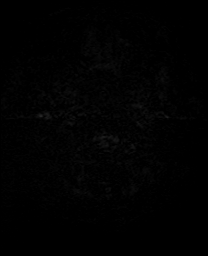
[im 20/60]
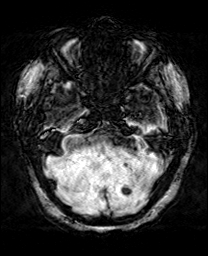
[im 40/60]
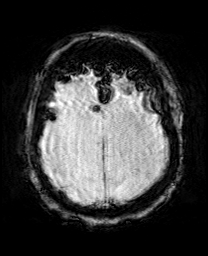
[im 60/60]
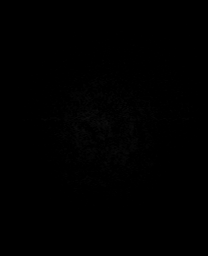

[Series 15: mip_images(sw) · axial · 24.0mm · 0.90mm/px · z∈[-168,-21]mm · 3 of 53 slices shown]
[im 1/53]
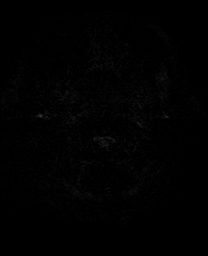
[im 27/53]
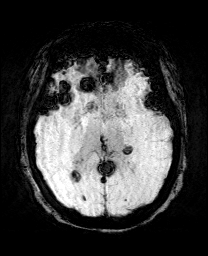
[im 53/53]
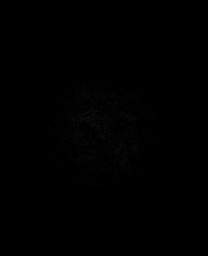

[Series 17: T2 post-contrast · coronal · 5.0mm · 0.72mm/px · 2 of 28 slices shown]
[im 1/28]
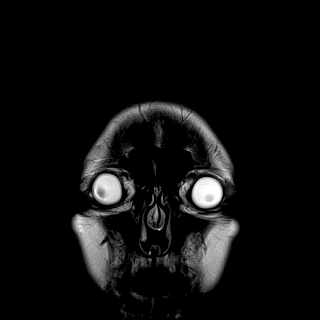
[im 28/28]
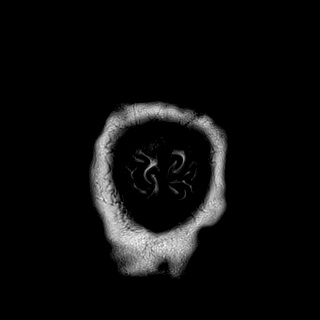

[Series 19: T1 post-contrast · coronal · 5.0mm · 0.34mm/px · 2 of 28 slices shown (1 of 2)]
[im 1/28]
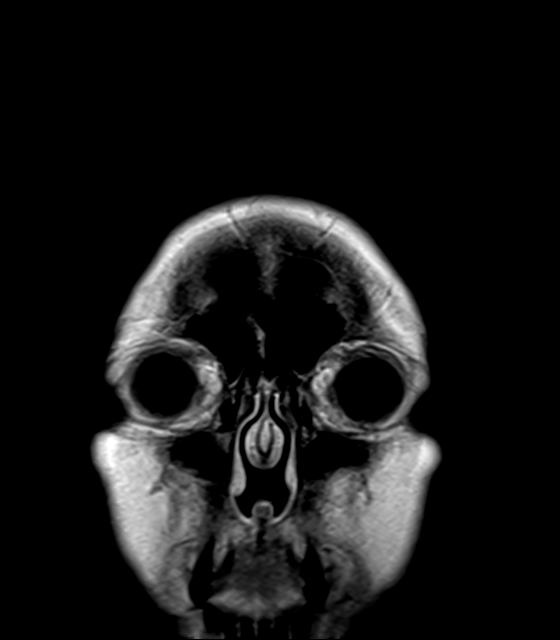
[im 28/28]
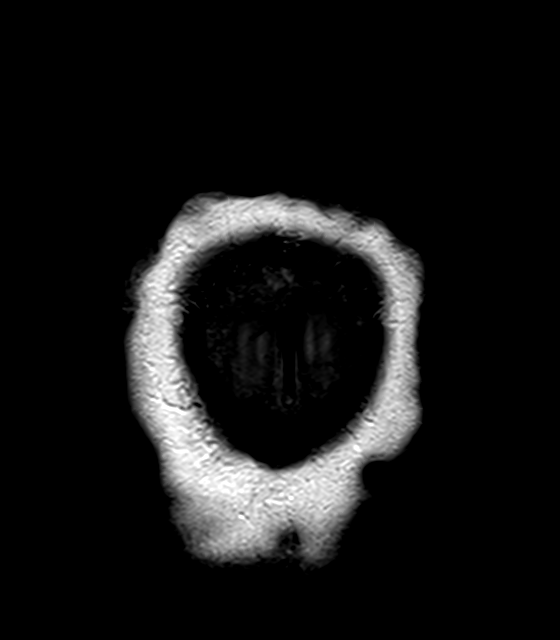

[Series 20: T1 post-contrast · sagittal · 5.0mm · 0.72mm/px · 1 of 23 slices shown (2 of 2)]
[im 1/23]
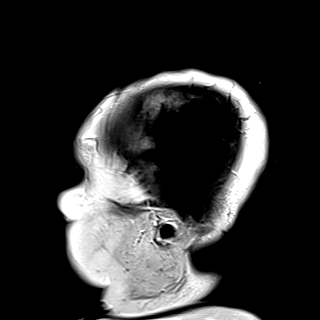

[40 of 48 positions shown; findings below may reference images not displayed]

FINDINGS: Brain: Small area diffusion restriction within the medial right
cerebellar hemisphere adjacent to the foramina of Luschka. There
areas of anti dependent magnetic susceptibility effect, likely
indicating pneumocephalus. There are postsurgical changes of
posterior fossa approach to resection of the intraventricular
neoplasm. There is a small amount of residual contrast enhancement
within the dorsal parenchyma at the level of the cervicomedullary
junction. The bulk of the mass has been resected.

Vascular: Normal flow voids

Skull and upper cervical spine: Status post suboccipital craniectomy

Sinuses/Orbits: Bilateral maxillary sinus mucosal thickening.

Other: None
IMPRESSION: 1. Status post fourth ventricle lesion resection. Small amount of
residual contrast enhancement within the dorsal parenchyma at the
level of the cervicomedullary junction, favored to be postsurgical.
This will serve as a baseline for future studies.
2. Small focus of acute ischemia within the medial right cerebellar
hemisphere.
3. Pneumocephalus.
